# Patient Record
Sex: Male | Born: 1955 | Race: White | Hispanic: No | Smoking: Never smoker
Health system: Southern US, Community
[De-identification: ages and names within clinical notes are randomized; demographics above are authoritative.]

## PROBLEM LIST (undated history)

## (undated) DIAGNOSIS — D649 Anemia, unspecified: Secondary | ICD-10-CM

## (undated) DIAGNOSIS — E119 Type 2 diabetes mellitus without complications: Secondary | ICD-10-CM

## (undated) DIAGNOSIS — E7800 Pure hypercholesterolemia, unspecified: Secondary | ICD-10-CM

## (undated) DIAGNOSIS — I509 Heart failure, unspecified: Secondary | ICD-10-CM

## (undated) DIAGNOSIS — I4891 Unspecified atrial fibrillation: Secondary | ICD-10-CM

## (undated) DIAGNOSIS — I2510 Atherosclerotic heart disease of native coronary artery without angina pectoris: Secondary | ICD-10-CM

## (undated) DIAGNOSIS — Z95810 Presence of automatic (implantable) cardiac defibrillator: Secondary | ICD-10-CM

## (undated) DIAGNOSIS — E785 Hyperlipidemia, unspecified: Secondary | ICD-10-CM

## (undated) DIAGNOSIS — I10 Essential (primary) hypertension: Secondary | ICD-10-CM

## (undated) HISTORY — DX: Hyperlipidemia, unspecified: E78.5

## (undated) HISTORY — DX: Atherosclerotic heart disease of native coronary artery without angina pectoris: I25.10

## (undated) HISTORY — PX: CARDIAC DEFIBRILLATOR PLACEMENT: SHX171

## (undated) HISTORY — DX: Essential (primary) hypertension: I10

## (undated) HISTORY — PX: INSERT / REPLACE / REMOVE PACEMAKER: SUR710

## (undated) HISTORY — PX: EYE SURGERY: SHX253

---

## 1994-02-26 HISTORY — PX: CARDIAC CATHETERIZATION: SHX172

## 1999-07-27 ENCOUNTER — Encounter: Payer: Self-pay | Admitting: Orthopedic Surgery

## 1999-07-27 ENCOUNTER — Ambulatory Visit (HOSPITAL_COMMUNITY): Admission: RE | Admit: 1999-07-27 | Discharge: 1999-07-27 | Payer: Self-pay | Admitting: Orthopedic Surgery

## 1999-08-10 ENCOUNTER — Encounter: Payer: Self-pay | Admitting: Orthopedic Surgery

## 1999-08-10 ENCOUNTER — Ambulatory Visit (HOSPITAL_COMMUNITY): Admission: RE | Admit: 1999-08-10 | Discharge: 1999-08-10 | Payer: Self-pay | Admitting: Orthopedic Surgery

## 1999-08-31 ENCOUNTER — Encounter: Payer: Self-pay | Admitting: Orthopedic Surgery

## 1999-09-08 ENCOUNTER — Encounter: Payer: Self-pay | Admitting: Orthopedic Surgery

## 1999-09-08 ENCOUNTER — Encounter (INDEPENDENT_AMBULATORY_CARE_PROVIDER_SITE_OTHER): Payer: Self-pay | Admitting: Specialist

## 1999-09-08 ENCOUNTER — Inpatient Hospital Stay (HOSPITAL_COMMUNITY): Admission: EM | Admit: 1999-09-08 | Discharge: 1999-09-11 | Payer: Self-pay | Admitting: Orthopedic Surgery

## 1999-09-12 ENCOUNTER — Ambulatory Visit: Admission: RE | Admit: 1999-09-12 | Discharge: 1999-09-12 | Payer: Self-pay | Admitting: Specialist

## 2000-02-27 HISTORY — PX: CORONARY ARTERY BYPASS GRAFT: SHX141

## 2000-04-23 ENCOUNTER — Ambulatory Visit (HOSPITAL_COMMUNITY): Admission: RE | Admit: 2000-04-23 | Discharge: 2000-04-23 | Payer: Self-pay | Admitting: Specialist

## 2000-04-23 ENCOUNTER — Encounter: Payer: Self-pay | Admitting: Specialist

## 2000-06-24 ENCOUNTER — Ambulatory Visit (HOSPITAL_COMMUNITY): Admission: RE | Admit: 2000-06-24 | Discharge: 2000-06-24 | Payer: Self-pay | Admitting: Cardiology

## 2000-08-11 ENCOUNTER — Encounter: Payer: Self-pay | Admitting: Emergency Medicine

## 2000-08-11 ENCOUNTER — Inpatient Hospital Stay (HOSPITAL_COMMUNITY): Admission: EM | Admit: 2000-08-11 | Discharge: 2000-08-27 | Payer: Self-pay | Admitting: Emergency Medicine

## 2000-08-21 ENCOUNTER — Encounter: Payer: Self-pay | Admitting: Thoracic Surgery (Cardiothoracic Vascular Surgery)

## 2000-08-22 ENCOUNTER — Encounter: Payer: Self-pay | Admitting: Thoracic Surgery (Cardiothoracic Vascular Surgery)

## 2000-08-23 ENCOUNTER — Encounter: Payer: Self-pay | Admitting: Thoracic Surgery (Cardiothoracic Vascular Surgery)

## 2000-08-24 ENCOUNTER — Encounter: Payer: Self-pay | Admitting: Thoracic Surgery (Cardiothoracic Vascular Surgery)

## 2000-09-24 ENCOUNTER — Ambulatory Visit (HOSPITAL_COMMUNITY): Admission: RE | Admit: 2000-09-24 | Discharge: 2000-09-24 | Payer: Self-pay | Admitting: Cardiology

## 2004-01-19 ENCOUNTER — Ambulatory Visit: Payer: Self-pay | Admitting: Internal Medicine

## 2004-01-27 ENCOUNTER — Ambulatory Visit (HOSPITAL_COMMUNITY): Admission: RE | Admit: 2004-01-27 | Discharge: 2004-01-27 | Payer: Self-pay | Admitting: Surgery

## 2004-03-10 ENCOUNTER — Ambulatory Visit: Payer: Self-pay | Admitting: Cardiovascular Disease

## 2004-03-13 ENCOUNTER — Inpatient Hospital Stay (HOSPITAL_COMMUNITY): Admission: RE | Admit: 2004-03-13 | Discharge: 2004-03-13 | Payer: Self-pay | Admitting: Internal Medicine

## 2004-03-13 ENCOUNTER — Ambulatory Visit: Payer: Self-pay | Admitting: Internal Medicine

## 2004-03-27 ENCOUNTER — Ambulatory Visit: Payer: Self-pay | Admitting: Internal Medicine

## 2004-07-11 ENCOUNTER — Ambulatory Visit: Payer: Self-pay | Admitting: Internal Medicine

## 2004-09-07 ENCOUNTER — Ambulatory Visit: Payer: Self-pay | Admitting: Internal Medicine

## 2004-10-31 ENCOUNTER — Ambulatory Visit (HOSPITAL_COMMUNITY): Admission: RE | Admit: 2004-10-31 | Discharge: 2004-10-31 | Payer: Self-pay | Admitting: Gastroenterology

## 2004-11-20 ENCOUNTER — Encounter (INDEPENDENT_AMBULATORY_CARE_PROVIDER_SITE_OTHER): Payer: Self-pay | Admitting: *Deleted

## 2004-11-20 ENCOUNTER — Ambulatory Visit (HOSPITAL_COMMUNITY): Admission: RE | Admit: 2004-11-20 | Discharge: 2004-11-20 | Payer: Self-pay | Admitting: General Surgery

## 2005-11-30 ENCOUNTER — Ambulatory Visit: Payer: Self-pay | Admitting: Internal Medicine

## 2006-07-16 ENCOUNTER — Ambulatory Visit: Payer: Self-pay | Admitting: Internal Medicine

## 2006-07-24 LAB — CBC WITH DIFFERENTIAL/PLATELET
BASO%: 0.3 % (ref 0.0–2.0)
Basophils Absolute: 0 10*3/uL (ref 0.0–0.1)
EOS%: 2 % (ref 0.0–7.0)
Eosinophils Absolute: 0.3 10*3/uL (ref 0.0–0.5)
HCT: 43.6 % (ref 38.7–49.9)
HGB: 15.3 g/dL (ref 13.0–17.1)
LYMPH%: 37 % (ref 14.0–48.0)
MCH: 31.8 pg (ref 28.0–33.4)
MCHC: 35.1 g/dL (ref 32.0–35.9)
MCV: 90.5 fL (ref 81.6–98.0)
MONO#: 1 10*3/uL — ABNORMAL HIGH (ref 0.1–0.9)
MONO%: 8.5 % (ref 0.0–13.0)
NEUT#: 6.4 10*3/uL (ref 1.5–6.5)
NEUT%: 52.2 % (ref 40.0–75.0)
Platelets: 266 10*3/uL (ref 145–400)
RBC: 4.82 10*6/uL (ref 4.20–5.71)
RDW: 13.9 % (ref 11.2–14.6)
WBC: 12.3 10*3/uL — ABNORMAL HIGH (ref 4.0–10.0)
lymph#: 4.5 10*3/uL — ABNORMAL HIGH (ref 0.9–3.3)

## 2006-07-26 LAB — COMPREHENSIVE METABOLIC PANEL
ALT: 25 U/L (ref 0–53)
AST: 18 U/L (ref 0–37)
Albumin: 4.8 g/dL (ref 3.5–5.2)
Alkaline Phosphatase: 72 U/L (ref 39–117)
BUN: 19 mg/dL (ref 6–23)
CO2: 25 mEq/L (ref 19–32)
Calcium: 9.2 mg/dL (ref 8.4–10.5)
Chloride: 103 mEq/L (ref 96–112)
Creatinine, Ser: 0.98 mg/dL (ref 0.40–1.50)
Glucose, Bld: 95 mg/dL (ref 70–99)
Potassium: 4.8 mEq/L (ref 3.5–5.3)
Sodium: 140 mEq/L (ref 135–145)
Total Bilirubin: 0.4 mg/dL (ref 0.3–1.2)
Total Protein: 7.7 g/dL (ref 6.0–8.3)

## 2006-07-26 LAB — LACTATE DEHYDROGENASE: LDH: 167 U/L (ref 94–250)

## 2006-07-26 LAB — LEUKOCYTE ALKALINE PHOS: Leukocyte Alkaline  Phos Stain: 134 — ABNORMAL HIGH (ref 22–124)

## 2006-08-07 ENCOUNTER — Encounter: Payer: Self-pay | Admitting: Internal Medicine

## 2006-08-07 ENCOUNTER — Other Ambulatory Visit: Admission: RE | Admit: 2006-08-07 | Discharge: 2006-08-07 | Payer: Self-pay | Admitting: Internal Medicine

## 2006-08-07 LAB — CBC WITH DIFFERENTIAL/PLATELET
BASO%: 0.3 % (ref 0.0–2.0)
Basophils Absolute: 0 10*3/uL (ref 0.0–0.1)
EOS%: 3.3 % (ref 0.0–7.0)
Eosinophils Absolute: 0.3 10*3/uL (ref 0.0–0.5)
HCT: 44.8 % (ref 38.7–49.9)
HGB: 15.3 g/dL (ref 13.0–17.1)
LYMPH%: 36.3 % (ref 14.0–48.0)
MCH: 31.2 pg (ref 28.0–33.4)
MCHC: 34.2 g/dL (ref 32.0–35.9)
MCV: 91.1 fL (ref 81.6–98.0)
MONO#: 0.9 10*3/uL (ref 0.1–0.9)
MONO%: 8.9 % (ref 0.0–13.0)
NEUT#: 4.9 10*3/uL (ref 1.5–6.5)
NEUT%: 51.2 % (ref 40.0–75.0)
Platelets: 259 10*3/uL (ref 145–400)
RBC: 4.91 10*6/uL (ref 4.20–5.71)
RDW: 13.9 % (ref 11.2–14.6)
WBC: 9.7 10*3/uL (ref 4.0–10.0)
lymph#: 3.5 10*3/uL — ABNORMAL HIGH (ref 0.9–3.3)

## 2006-08-07 LAB — LACTATE DEHYDROGENASE: LDH: 185 U/L (ref 94–250)

## 2006-08-14 LAB — FLOW CYTOMETRY

## 2006-11-04 ENCOUNTER — Ambulatory Visit: Payer: Self-pay | Admitting: Internal Medicine

## 2006-11-06 LAB — CBC WITH DIFFERENTIAL/PLATELET
BASO%: 0.4 % (ref 0.0–2.0)
Basophils Absolute: 0 10*3/uL (ref 0.0–0.1)
EOS%: 2.5 % (ref 0.0–7.0)
Eosinophils Absolute: 0.3 10*3/uL (ref 0.0–0.5)
HCT: 43 % (ref 38.7–49.9)
HGB: 15.1 g/dL (ref 13.0–17.1)
LYMPH%: 29.8 % (ref 14.0–48.0)
MCH: 32 pg (ref 28.0–33.4)
MCHC: 35 g/dL (ref 32.0–35.9)
MCV: 91.3 fL (ref 81.6–98.0)
MONO#: 1.1 10*3/uL — ABNORMAL HIGH (ref 0.1–0.9)
MONO%: 8.7 % (ref 0.0–13.0)
NEUT#: 7.4 10*3/uL — ABNORMAL HIGH (ref 1.5–6.5)
NEUT%: 58.6 % (ref 40.0–75.0)
Platelets: 276 10*3/uL (ref 145–400)
RBC: 4.71 10*6/uL (ref 4.20–5.71)
RDW: 14.6 % (ref 11.2–14.6)
WBC: 12.6 10*3/uL — ABNORMAL HIGH (ref 4.0–10.0)
lymph#: 3.8 10*3/uL — ABNORMAL HIGH (ref 0.9–3.3)

## 2006-11-06 LAB — LACTATE DEHYDROGENASE: LDH: 165 U/L (ref 94–250)

## 2006-11-14 LAB — BCR/ABL

## 2006-11-20 LAB — CBC WITH DIFFERENTIAL/PLATELET
BASO%: 0.7 % (ref 0.0–2.0)
Basophils Absolute: 0.1 10*3/uL (ref 0.0–0.1)
EOS%: 2.4 % (ref 0.0–7.0)
Eosinophils Absolute: 0.3 10*3/uL (ref 0.0–0.5)
HCT: 43.2 % (ref 38.7–49.9)
HGB: 14.9 g/dL (ref 13.0–17.1)
LYMPH%: 32.2 % (ref 14.0–48.0)
MCH: 31.7 pg (ref 28.0–33.4)
MCHC: 34.5 g/dL (ref 32.0–35.9)
MCV: 91.8 fL (ref 81.6–98.0)
MONO#: 1 10*3/uL — ABNORMAL HIGH (ref 0.1–0.9)
MONO%: 7.5 % (ref 0.0–13.0)
NEUT#: 7.6 10*3/uL — ABNORMAL HIGH (ref 1.5–6.5)
NEUT%: 57.2 % (ref 40.0–75.0)
Platelets: 270 10*3/uL (ref 145–400)
RBC: 4.7 10*6/uL (ref 4.20–5.71)
RDW: 14.2 % (ref 11.2–14.6)
WBC: 13.2 10*3/uL — ABNORMAL HIGH (ref 4.0–10.0)
lymph#: 4.3 10*3/uL — ABNORMAL HIGH (ref 0.9–3.3)

## 2006-11-20 LAB — LACTATE DEHYDROGENASE: LDH: 182 U/L (ref 94–250)

## 2007-05-16 ENCOUNTER — Ambulatory Visit: Payer: Self-pay | Admitting: Internal Medicine

## 2007-05-21 LAB — CBC WITH DIFFERENTIAL/PLATELET
BASO%: 0.7 % (ref 0.0–2.0)
Basophils Absolute: 0.1 10e3/uL (ref 0.0–0.1)
EOS%: 2.4 % (ref 0.0–7.0)
Eosinophils Absolute: 0.3 10e3/uL (ref 0.0–0.5)
HCT: 42 % (ref 38.7–49.9)
HGB: 14.4 g/dL (ref 13.0–17.1)
LYMPH%: 34.7 % (ref 14.0–48.0)
MCH: 31.1 pg (ref 28.0–33.4)
MCHC: 34.3 g/dL (ref 32.0–35.9)
MCV: 90.8 fL (ref 81.6–98.0)
MONO#: 1 10e3/uL — ABNORMAL HIGH (ref 0.1–0.9)
MONO%: 8.9 % (ref 0.0–13.0)
NEUT#: 6 10e3/uL (ref 1.5–6.5)
NEUT%: 53.3 % (ref 40.0–75.0)
Platelets: 219 10e3/uL (ref 145–400)
RBC: 4.63 10e6/uL (ref 4.20–5.71)
RDW: 14.8 % — ABNORMAL HIGH (ref 11.2–14.6)
WBC: 11.3 10e3/uL — ABNORMAL HIGH (ref 4.0–10.0)
lymph#: 3.9 10e3/uL — ABNORMAL HIGH (ref 0.9–3.3)

## 2007-05-21 LAB — LACTATE DEHYDROGENASE: LDH: 170 U/L (ref 94–250)

## 2008-08-11 ENCOUNTER — Emergency Department (HOSPITAL_COMMUNITY): Admission: EM | Admit: 2008-08-11 | Discharge: 2008-08-11 | Payer: Self-pay | Admitting: Emergency Medicine

## 2009-11-14 ENCOUNTER — Ambulatory Visit: Payer: Self-pay | Admitting: Cardiology

## 2009-11-19 ENCOUNTER — Ambulatory Visit: Payer: Self-pay | Admitting: Cardiology

## 2010-01-28 ENCOUNTER — Encounter: Payer: Self-pay | Admitting: Internal Medicine

## 2010-03-30 NOTE — Miscellaneous (Signed)
Summary: Device preload  Clinical Lists Changes  Observations: Added new observation of ICD INDICATN: CM (01/28/2010 13:10) Added new observation of ICDLEADSTAT2: active (01/28/2010 13:10) Added new observation of ICDLEADSER2: [id number] (01/28/2010 13:10) Added new observation of ICDLEADMOD2: 6949  (01/28/2010 13:10) Added new observation of ICDLEADDOI2: 03/13/2004  (01/28/2010 13:10) Added new observation of ICDLEADLOC2: RV  (01/28/2010 13:10) Added new observation of ICDLEADSTAT1: active  (01/28/2010 13:10) Added new observation of ICDLEADSER1: [id number]  (01/28/2010 13:10) Added new observation of ICDLEADMOD1: 5076  (01/28/2010 13:10) Added new observation of ICDLEADDOI1: 03/13/2004  (01/28/2010 13:10) Added new observation of ICDLEADLOC1: RA  (01/28/2010 13:10) Added new observation of ICD IMP MD: Sherryl Manges, MD  (01/28/2010 13:10) Added new observation of ICD IMPL DTE: 03/13/2004  (01/28/2010 13:10) Added new observation of ICD SERL#: [id number]  (01/28/2010 13:10) Added new observation of ICD MODL#: 7278  (01/28/2010 13:10) Added new observation of ICDMANUFACTR: Medtronic  (01/28/2010 13:10) Added new observation of IDC REFER MD: Duffy Rhody Tennant,MD  (01/28/2010 13:10) Added new observation of ICD MD: [name], MD  (01/28/2010 13:10)       ICD Specifications Following MD:  [name], MD     Referring MD:  Rolla Plate ICD Vendor:  Medtronic     ICD Model Number:  [phone]     ICD Serial Number:  [id number] ICD DOI:  03/13/2004     ICD Implanting MD:  Sherryl Manges, MD  Lead 1:    Location: RA     DOI: 03/13/2004     Model #: [id number]     Serial #: [id number]     Status: active Lead 2:    Location: RV     DOI: 03/13/2004     Model #: [id number]     Serial #: [id number]     Status: active  Indications::  CM

## 2010-05-01 ENCOUNTER — Encounter: Payer: Self-pay | Admitting: Internal Medicine

## 2010-05-01 ENCOUNTER — Encounter (INDEPENDENT_AMBULATORY_CARE_PROVIDER_SITE_OTHER): Payer: Medicare PPO | Admitting: Internal Medicine

## 2010-05-01 DIAGNOSIS — E785 Hyperlipidemia, unspecified: Secondary | ICD-10-CM | POA: Insufficient documentation

## 2010-05-01 DIAGNOSIS — Z6841 Body Mass Index (BMI) 40.0 and over, adult: Secondary | ICD-10-CM | POA: Insufficient documentation

## 2010-05-01 DIAGNOSIS — I2589 Other forms of chronic ischemic heart disease: Secondary | ICD-10-CM | POA: Insufficient documentation

## 2010-05-01 DIAGNOSIS — Z95810 Presence of automatic (implantable) cardiac defibrillator: Secondary | ICD-10-CM | POA: Insufficient documentation

## 2010-05-01 DIAGNOSIS — I255 Ischemic cardiomyopathy: Secondary | ICD-10-CM | POA: Insufficient documentation

## 2010-05-01 DIAGNOSIS — I10 Essential (primary) hypertension: Secondary | ICD-10-CM | POA: Insufficient documentation

## 2010-05-01 DIAGNOSIS — I5022 Chronic systolic (congestive) heart failure: Secondary | ICD-10-CM

## 2010-05-09 NOTE — Assessment & Plan Note (Signed)
Summary: icd check/medtronic/[place] CARD patient/hm   Visit Type:  Follow-up Primary Provider:  Dr.Fred Andrey [facility]   History of Present Illness: Douglas [name] is referred  today for ongoing evaluation and management of his ICD. He is a pleasant middle aged man with an ICM, s/p ICD implantation, HTN, dyslipidemia, and low back pain. He is s/p CABG in 2002. He has recently gotten a dog and with daily walks has continued to slowly lose weight. He denies c/p, sob or peripheral edema. No ICD therapies.  Current Medications (verified): 1)  Furosemide 40 Mg Tabs (Furosemide) .... Take 1 Tab Daily 2)  Toprol Xl 50 Mg Xr24h-Tab (Metoprolol Succinate) .... Take 1 Tab Daily 3)  Lisinopril 10 Mg Tabs (Lisinopril) .... Take 1 Tab Daily 4)  Cyclobenzaprine Hcl 10 Mg Tabs (Cyclobenzaprine Hcl) .... Take 1 Tab Daily 5)  Proair Hfa 108 (90 Base) Mcg/act Aers (Albuterol Sulfate) .... As Needed 6)  Alprazolam 1 Mg Tabs (Alprazolam) .... As Needed 7)  Pravastatin Sodium 80 Mg Tabs (Pravastatin Sodium) .... Take One Tablet By Mouth Daily At Bedtime 8)  Percocet 10-650 Mg Tabs (Oxycodone-Acetaminophen) .... As Needed For Pain  Allergies (verified): No Known Drug Allergies  Comments:  Nurse/Medical Assistant: patient uses [facility] in [place]  Past History:  Past Medical History: Last updated: April 30, 2010 myocardial infarction times 4 stenting in 1996 coronary artery bypass grafting times 2  obese hypertension dyslipidemia asthma fatigue  Past Surgical History: Last updated: April 30, 2010 coronary artery bypass grafting times 4 (Dr.Clarence Cornelius Moras) 2002 stent placement 1996  Family History: Last updated: 04/30/10 Father:deceased age 39 due to myocardial infarction Mother:alive age 95  Social History: Last updated: 2010-04-30 Disabled (truck driver) Divorced  Tobacco Use - Former.  Alcohol Use - no Regular Exercise - no Drug Use - no  Review of Systems   All systems reviewed and negative except as noted in the HPI.  Vital Signs:  Patient profile:   55 year old male Height:      69 inches Weight:      289 pounds BMI:     42.83 Pulse rate:   93 / minute BP sitting:   155 / 93  (left arm)  Vitals Entered By: Douglas Saa, CNA (May 01, 2010 10:19 AM)  Physical Exam  General:  Obese, well developed, well nourished middle age man in no acute distress.  HEENT: normal Neck: supple. No JVD. Carotids 2+ bilaterally no bruits Cor: RRR no rubs, gallops or murmur Lungs: CTA. Well healed ICD incision. Ab: soft, nontender. nondistended. No HSM. Good bowel sounds Ext: warm. no cyanosis, clubbing or edema Neuro: alert and oriented. Grossly nonfocal. affect pleasant     ICD Specifications Following MD:  [name], MD     Referring MD:  Rolla Plate ICD Vendor:  Medtronic     ICD Model Number:  [id number]     ICD Serial Number:  [id number] ICD DOI:  03/13/2004     ICD Implanting MD:  Sherryl Manges, MD  Lead 1:    Location: RA     DOI: 03/13/2004     Model #: [id number]     Serial #: [id number]     Status: active Lead 2:    Location: RV     DOI: 03/13/2004     Model #: [id number]     Serial #: [id number]     Status: active  Indications::  CM  MD Comments:  Normal device function.  Impression & Recommendations:  Problem # 1:  AUTOMATIC IMPLANTABLE CARDIAC DEFIBRILLATOR SITU (ICD-V45.02) His device is working normally. Wil follow.  Problem # 2:  CARDIOMYOPATHY, ISCHEMIC (ICD-414.8) He denies anginal symptoms. Continue meds as below. His updated medication list for this problem includes:    Furosemide 40 Mg Tabs (Furosemide) .Marland Kitchen... Take 1 tab daily    Toprol Xl 50 Mg Xr24h-tab (Metoprolol succinate) .Marland Kitchen... Take 1 tab daily    Lisinopril 10 Mg Tabs (Lisinopril) .Marland Kitchen... Take 1 tab daily  Problem # 3:  ESSENTIAL HYPERTENSION, BENIGN (ICD-401.1) His blood pressure is moderately elevated. He will continue his current meds and maintain a low sodium diet.  I have asked that he continue to walk and to lose weight. His updated medication list for this problem includes:    Furosemide 40 Mg Tabs (Furosemide) .Marland Kitchen... Take 1 tab daily    Toprol Xl 50 Mg Xr24h-tab (Metoprolol succinate) .Marland Kitchen... Take 1 tab daily    Lisinopril 10 Mg Tabs (Lisinopril) .Marland Kitchen... Take 1 tab daily  Patient Instructions: 1)  Your physician recommends that you schedule a follow-up appointment in: 1 year

## 2010-05-16 NOTE — Cardiovascular Report (Signed)
Summary: Office Visit   Office Visit   Imported By: Roderic Ovens 05/10/2010 11:51:29  _____________________________________________________________________  External Attachment:    Type:   Image     Comment:   External Document

## 2010-06-05 LAB — POCT CARDIAC MARKERS
CKMB, poc: 1.3 ng/mL (ref 1.0–8.0)
CKMB, poc: <1 ng/mL — ABNORMAL LOW (ref 1.0–8.0)
Myoglobin, poc: 144 ng/mL (ref 12–200)
Myoglobin, poc: 173 ng/mL (ref 12–200)
Troponin i, poc: <0.05 ng/mL (ref 0.00–0.09)
Troponin i, poc: <0.05 ng/mL (ref 0.00–0.09)

## 2010-07-14 NOTE — Discharge Summary (Signed)
Douglas [name], WHANG NO.:  [id number]   MEDICAL RECORD NO.:  [id number]          PATIENT TYPE:  INP   LOCATION:  4733                         FACILITY:  [facility]   PHYSICIAN:  Duke Salvia, M.D.  DATE OF BIRTH:  [birth year]   DATE OF ADMISSION:  03/13/2004  DATE OF DISCHARGE:  03/13/2004                                 DISCHARGE SUMMARY   DISCHARGE DIAGNOSES:  1.  Discharging day of implantation of Medtronic MAXIMO DR [id number] cardioverter-      defibrillator with defibrillator threshold study less than or equal to      15 joules.  2.  History of myocardial infarction times four, the first in 1991.  3.  Known coronary artery disease status post stent in 1996 with repeat      catheterization in 2002 with second stent placed with post procedure      angina, subsequent coronary artery bypass graft surgery times two in      2002.   SECONDARY DIAGNOSES:  1.  Hypertension.  2.  Dyslipidemia.  3.  Morbid obesity.  4.  Negative sleep study.  5.  Family history of premature coronary artery disease.  6.  Back surgery times three.   PROCEDURES:  March 13, 2004, implantation of a Medtronic [facility]  cardioverter-defibrillator by Dr. Sherryl Manges, practitioner, defibrillation  threshold study less than or equal to 15 joules.   Patient tolerated the procedure well, had no post procedure dysrhythmias.  His incision at the time of discharge was not swollen nor draining and his  pain was controlled with oral analgesia.   PATIENT DISCHARGED ON THE FOLLOWING MEDICATIONS:  1.  Lipitor 40 mg daily.  2.  Potassium chloride 20 mEq daily.  3.  Furosemide 40 mg daily.  4.  Toprol __________ mg daily.  5.  Lisinopril 10 mg daily.  6.  Advair 250/50 2 puffs twice daily.  7.  Cyclobenzaprine 10 mg daily.  8.  Albuterol inhaler as needed.  9.  Xanax 2 mg tablets as needed.  10. For pain, he may take Tylenol 325 mg 1-2 tabs q.4-6h.   Mobility has been discussed with the patient.   He is to refrain from moving  the right upper extremity above the shoulder for the next 4 days.  This is  particularly important in order to not cause lead dislodgement.  He was  asked to keep his incision dry for the next 7 days.  He will be asked to  remove his bandage on the morning of Tuesday, March 14, 2004, and he will  leave the incision open and dry for the next 7 days.   FOLLOW UP:  Will be at [facility], [address], for ICD  clinic Monday, March 27, 2004, at 9:30 a.m. and he will see Dr. Graciela Husbands  Wednesday June 21, 2004, at 10:45 a.m.   BRIEF HISTORY:  Mr. Revard is a 55 year old male with a prior history of  myocardial infarction x4.  His first myocardial infarction dates to 52.  He had his first stent placed in 1996 with a repeat  myocardial infarction  and stent in 2002.  He continued to have post-procedure angina and underwent  subsequently coronary artery bypass graft surgery x2 at that same  hospitalization.  He had a Cardiolite study in mid November, 2005,  demonstrating anterior and inferior defects with ejection fraction 26%.  Mr.  Raygoza meets criteria for cardioverter-defibrillator implant by the MADIT-  II study.  He presents today for elective procedure.  Mr. Nuzum is also  morbidly obese, though he is working on weight reduction; he is down to 294  from 309.  He has hypertension, dyslipidemia, asthma, fatigue and four  pillow orthopnea.  He will have palpitation and chest discomfort if he  pushes his exercise level too hard.  His coronary artery bypass include the  LIMA to the LAD, a saphenous vein graft to the diagonal and a sequential  saphenous vein graft to the PDA and into the posterolateral branch.   HOSPITAL COURSE:  Mr. Surgeon presents electively for cardioverter-  defibrillator placement on the morning of March 13, 2004.  The procedure  was done without complications by Dr. Graciela Husbands the same day.  He discharges  later that day.   Once again, he has maintained sinus rhythm, has had no  respiratory compromise.  He has not been hypotensive after the study.  He  goes home with the medication and followup as dictated.       GM/MEDQ  D:  03/13/2004  T:  03/13/2004  Job:  [id number]   cc:   Duke Salvia, M.D.   Khris P. Merla Riches, M.D.  [address]  [place]  [place] [address]  Fax: [phone]   Colleen Can. Deborah Chalk, M.D.  Fax: [phone]

## 2010-07-14 NOTE — Consult Note (Signed)
Douglas [name]. [facility]  Patient:    Douglas [name], Douglas [name]                     MRN: [id number] Proc. Date: 08/21/00 Adm. Date:  [id number] Attending:  Eleanora Neighbor CC:         Colleen Can. Deborah Chalk, M.D.  Alvia Grove., M.D.  [facility] office   Consultation Report  REQUESTING PHYSICIAN:  Dr. Delfin Edis.  REASON FOR CONSULTATION:  Severe two-vessel coronary artery disease, status post acute myocardial infarction with class 4 postinfarction angina and class 4 congestive heart failure.  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  He underwent angioplasty of the left anterior descending coronary artery at that time.  He underwent repeat angioplasty of the left anterior descending coronary artery in 1996 after another myocardial infarction.  Since that time, he has been treated medically.  He presented several weeks ago with symptoms of progressive shortness of breath and exertional angina.  Cardiac catheterization performed at that time demonstrated 50-60% ostial stenosis of the left anterior descending coronary artery with chronically occluded right coronary artery.  He was treated medically.  On June 16, he presented to the emergency room with massive acute anterior myocardial infarction.  He was taken to the cath lab by Dr. Delane Ginger where an occluded left anterior descending coronary artery was angioplastied.  Eventually, flow was reestablished, and an acceptable anatomical result was completed.  The patients peak total CK and CK-MB fractions following his infarction were notable for a total CK of 5048 and a total CK-MB of 1253.  He suffered from class 4 congestive heart failure and was treated with diuresis.  Over the ensuing several days, he has continued to have episodes of  chest pain requiring IV nitroglycerin and returned back to the cardiac intensive care unit.  Repeat cardiac catheterization performed by Dr. Deborah Chalk on June 25 demonstrates persistent ostial 60-70% stenosis of the left anterior descending coronary artery as well as chronically occluded right coronary artery.  There is severe left ventricular dysfunction with severely depressed ejection fraction and akinetic anterior wall of the left ventricle.  Cardiac surgical consultation is requested.  REVIEW OF SYSTEMS:  GENERAL:  Within normal limits.  The patient has been actively trying to lose weight and has lost nearly 60 pounds over the last six months.  RESPIRATORY:  The patient describes progressive dyspnea on exertion which began in April.  He has had shortness of breath with rest and with minimal activity since his heart attack this week while he remains in the hospital.  He denies any episodes of PND, orthopnea, or left lower extremity. He has had no problems with cough or hemoptysis.  GASTROINTESTINAL:  Within normal limits.  The patient has normal bowel habits and good appetite. NEUROLOGIC:  Within normal limits other than chronic pain related to chronic low back injury as well as chronic numbness involving the medial three toes of the right foot which dates back to his last back surgery.  MUSCULOSKELETAL: Chronic low back pain as previously outlined, otherwise within normal limits. GENITOURINARY:  Within normal limits.  INFECTIOUS:  Negative.  HEMATOLOGIC: Negative.  ENDOCRINE:  Negative.  PSYCHIATRIC:  The patient had some history of anxiety and panic attacks as well as depression and has been taking Xanax on a regular  basis recently.  VASCULAR:  Within normal limits.  The patient denies any history of claudication or TIA.  PAST MEDICAL HISTORY:  Notable for coronary artery disease as stated previously as well as hypertension, hyperlipidemia, and a strong Family History of coronary  artery disease.  The patient denies any history of previous stroke or diabetes.  PAST SURGICAL HISTORY:  Notable for prior back surgery x 2 for ruptured disks in the L1 through L5 interspaces.  He also had surgical excision of pilonidal cyst in the past.  SOCIAL HISTORY:  The patient lives alone and cares for his brother who is schizophrenic and a disabled veteran.  He has a supportive family.  He works full time as a Naval architect doing strenuous physical activity.  He has a history of heavy tobacco use, although he quit smoking in 1996.  He has no history of heavy alcohol consumption.  MEDICATIONS PRIOR TO ADMISSION:  Toprol XL, Imdur, Lipitor, aspirin, Xanax, Robaxin, and Lorcet tablets as necessary for pain.  The patient had previously been on Vioxx and quit taking this as he felt that it exacerbated his shortness of breath, hypertension, and angina.  ALLERGIES/INTOLERANCES:  The patient reports drug sensitivity to CODEINE and CODEINE-CONTAINING PRODUCTS.  PHYSICAL EXAMINATION:  VITAL SIGNS:  Blood pressure averages between 90 and 100 mmHg systolic.  He is afebrile.  GENERAL APPEARANCE:  Notable for a well-appearing, obese while male who appears his stated age in no acute distress.  SKIN:  Clean, dry, and healthy appearing throughout.  HEENT:  Essentially within normal limits.  NECK:  Supple.  There is no jugular venous distension.  No carotid bruits are noted.  LYMPHATICS:  There is no cervical or supraclavicular lymphadenopathy.  RESPIRATORY:  Auscultation of the chest reveals a few bibasilar rales.  No wheezes or rhonchi are noted.  CARDIOVASCULAR:  He is in normal sinus rhythm by monitor.  Cardiovascular exam demonstrates regular rate and rhythm.  No murmurs, rubs, or gallops are noted.  ABDOMEN:  Obese, soft, and nontender.  There are no masses, and the liver edge is not enlarged.   EXTREMITIES:  Warm and well perfused.  There is no lower extremity  edema. Distal pulses are palpable in both lower legs.  There is no venous insufficiency.  NEUROLOGIC:   Examination is grossly nonfocal and symmetrical throughout.  RECTAL/GENITOURINARY:  Deferred.  LABORATORY DATA:  Recent blood work is notable for basic metabolic panel within normal limits on June 25 as well as a complete blood count on the same day with white blood count 11,800, hemoglobin 13.1, hematocrit of 38%, and a platelet count of 292,000.  The patients serum creatinine was 0.8. Coagulation profile was within normal limits.  DIAGNOSTIC TESTS:  Cardiac catheterization films obtained at the time of hospital admission as well as on June 25 are both reviewed.  These demonstrate initially the presence of totally occluded left anterior descending coronary artery which was angioplastied at the time of hospital admission.  The patient was left with 60-70% ostial stenosis of the left anterior descending coronary artery as well as some irregular areas in the midportion of the vessel.  There is a large diagonal branch which takes off before the level of the angioplasty.  The right coronary artery is chronically occluded and posterior descending coronary artery and right posterior lateral branch both fill via collaterals from the left coronary circulation.  There is insignificant disease in the left circumflex system.  Left ventricular function appears moderate to severely reduced with  ejection fraction estimated 30% and severe anterior apical akinesis.  IMPRESSION:  Severe two-vessel coronary artery disease, status post massive acute anterior myocardial infarction complicated by class 4 congestive heart failure.  Initially successfully treated with angioplasty of the left anterior descending coronary artery, but the patient continues to have class 4 postinfarction angina and had repeat catheterization demonstrating persistent disease.  PLAN:  We tentatively plan to proceed with  elective coronary artery bypass grafting on Friday, June 28.  I have outlined at length the indications, risks, and potential benefits of surgery with Douglas [name].  All of his questions have been addressed. DD:  08/21/00 TD:  08/21/00 Job: [id number]

## 2010-07-14 NOTE — Op Note (Signed)
NAME:  Douglas [name], Douglas [name] NO.:  [id number]   MEDICAL RECORD NO.:  [id number]          PATIENT TYPE:  AMB   LOCATION:  DAY                          [facility]ACILITY[facility]  [facility]   PHYSICIAN:  Ollen Gross. Vernell Morgans, M.D. DATE OF BIRTH:  September 09, 1955   DATE OF PROCEDURE:  11/20/2004  DATE OF DISCHARGE:  11/20/2004                                 OPERATIVE REPORT   PREOPERATIVE DIAGNOSIS:  Internal hemorrhoids.   POSTOPERATIVE DIAGNOSIS:  Internal hemorrhoids.   PROCEDURES:  PPH (procedure for prolapse and hemorrhoids) hemorrhoidectomy.   SURGEON:  Ollen Gross. Carolynne Edouard, M.D.   ANESTHESIA:  General endotracheal.   PROCEDURE:  After informed consent was obtained, the patient was brought to  the operating room and left in supine position on the stretcher. After  adequate induction of general anesthesia, the patient was moved into a prone  position on the operating room table. All pressure points were padded. The  patient was then placed in a jackknife type position and his buttocks were  retracted laterally with tape. His perirectal area was then prepped with  Betadine and draped in usual sterile manner. Initially a small bullet  retractor was placed in the rectum and the rectum was examined. Only  internal hemorrhoids were noted. A large deep Fansler retractor was then  placed within the rectum. A 2-0 Prolene pursestring stitch was then placed  in the mucosa of the rectum approximately 4-5 cm deep to the dentate line.  This stitch was placed making sure that each throw of the stitch started  where the previous throw ended and gathering up only mucosa and submucosa.  Once this pursestring stitch was in place, the Fansler retractor was  removed. The ends of the pursestring stitch were brought through a clear  retractor and white dilator and the retractor and dilator were placed within  the rectum. The white dilator was then removed. A PTH stapling device was  then placed in the rectum and was felt  to fall beneath the pursestring  stitch. The pursestring stitch was then cinched down and tied. The ends of  this pursestring stitch were brought through the lateral openings in the  stapling device. An air knot was then thrown for retraction purposes while  continuing gentle traction on the pursestring stitch. The stapling device  was gradually closed all the way. Once the device was closed all the way, it  appeared to be in very good position. A minute was allowed to pass. The  stapling device was then fired. Another minute was allowed to pass. The  stapling device was then opened a half turn and removed from the rectum. The  specimen was then examined. There was a very uniform doughnut of internal  hemorrhoidal tissue. No muscle was identified. This was sent to pathology  for further evaluation. The smaller Fansler retractor was then placed within  the rectum and the staple line was examined. Several small bleeding points  were controlled with figure-of-eight 4-0 Vicryl stitches until the area was  completely hemostatic. Prior to starting the procedure, the perirectal area  was infiltrated with 0.25% Marcaine  with epinephrine and 1 cc Wydase and the  tissue was massaged gently. At the end of the case, the rest of the 0.25%  Marcaine with epinephrine was infiltrated around the perirectal area.  Lidocaine jelly was then placed in the rectum  along with a small piece of Gelfoam and sterile dressings were applied. The  patient tolerated the procedure well. At the end of the case, all needle,  sponge and instrument counts were correct. The patient was then awakened and  taken to recovery in stable condition.      Ollen Gross. Vernell Morgans, M.D.  Electronically Signed     PST/MEDQ  D:  11/22/2004  T:  11/22/2004  Job:  [id number]
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
## 2010-07-14 NOTE — Cardiovascular Report (Signed)
[place]  Patient:    Douglas [name], Douglas [name]                     MRN: [id number] Proc. Date: 08/20/00 Adm. Date:  [id number] Attending:  Eleanora Neighbor CC:         [facility]   Cardiac Catheterization  INDICATIONS:  The patient is a 55 year old male, who presents with an anterior myocardial infarction on August 11, 2000.  He has had post myocardial infarction angina.  He had an angioplasty and stent in his left anterior descending and this was a difficult procedure.  PROCEDURE:  Left heart catheterization with selective coronary angiography and left ventricular angiography.  TYPE AND SITE OF ENTRY:  Percutaneous right femoral artery.  CATHETERS:  A 6 French 4 curved Judkins right and left coronary catheters, 6 French pigtail ventriculographic catheter.  CONTRAST MATERIAL:  Omnipaque.  MEDICATIONS GIVEN DURING THE PROCEDURE:  Versed 4 mg IV.  MEDICATIONS GIVEN PRIOR TO THE PROCEDURE:  Valium 10 mg p.o.  COMMENTS:  The patient tolerated the procedure well.  HEMODYNAMIC DATA:  The aortic pressure was 88/58, LV was 88/23.  There was no aortic valve gradient noted on pullback.  LEFT VENTRICULOGRAPHY:  Left ventricular angiogram was performed in the RAO position.  Overall cardiac size was normal.  There was anterior and anteroapical hypokinesia.  The global ejection fraction would be exercise stress test to be approximately 45%.  There was mild inferior hypokinesia. There was no mitral regurgitation, intracardiac calcification or intracavity filling defect.  In particular, there was no apical thrombus.  ANGIOGRAPHIC DATA: 1. Right coronary artery:  The right coronary artery is totally occluded    after a small to moderate sized acute marginal vessel.  There was severe    and diffuse disease in the proximal segment of the right coronary artery    prior to this.  Stenoses would be in the 70-90% range with diffuse    disease.   The acute marginal vessel is small. 2. Left main:  The left main coronary artery has mild distal narrowing. 3. Left anterior descending:  The left anterior descending has approximately    50% ostial narrowing.  This involves the ostium of the left circumflex to    some extent.  The stented segment of the left anterior descending is    widely patent and there is excellent flow throughout the course of the    vessel.  There is a large diagonal vessel that is free of significant    stenosis.  The left anterior descending provides collaterals to the    distal right coronary artery bed.  Multiple attempts were made to try to    get clear image of the ostium of the largest of the diagonal vessels and    it is felt that probably be satisfactory. 4. Left circumflex:  The left circumflex is a reasonably large vessel with a    posterolateral branch and a large obtuse marginal.  There is 20-30%    ostial narrowing in this vessel.  The left circumflex continues to the    posterior apical wall and really does not have any significant severe    stenosis otherwise.  OVERALL IMPRESSION: 1. Totally occluded right coronary artery. 2. Persistent patency of the stent in the left anterior descending but with    a residual 50% ostial narrowing. 3. Minimal disease in the left circumflex system. 4. A 20-30% distal left main coronary  artery stenosis. 5. Mild to moderate reduction in left ventricular function with    anteroapical hypokinesia.  DISCUSSION:  Ones initial impression would be to try to manage the patient medically.  However, he does have some ostial left anterior descending disease and with a totally occluded right coronary artery and left ventricular dysfunction one has to be concerned about potential ischemia and ongoing risks especially at such a young age.  We will need to do probable pharmacologic and nuclear stress testing to make sure we do not have any areas of potential ischemia. DD:   08/20/00 TD:  08/20/00 Job: [id number]

## 2010-07-14 NOTE — H&P (Signed)
NAMEWORLEY, RADERMACHER NO.:  [id number]   MEDICAL RECORD NO.:  [id number]          PATIENT TYPE:  INP   LOCATION:  2899                         FACILITY:  [facility]   PHYSICIAN:  Duke Salvia, M.D.  DATE OF BIRTH:  Feb 09, 1956   DATE OF ADMISSION:  03/13/2004  DATE OF DISCHARGE:                                HISTORY & PHYSICAL   PRIMARY CARE PHYSICIAN:  Harrel Lemon. Merla Riches, M.D.   CARDIOLOGIST:  Colleen Can. Deborah Chalk, M.D.   ELECTROPHYSIOLOGIST:  Duke Salvia, M.D.   ALLERGIES:  CODEINE, VIOXX.   PRESENTING CIRCUMSTANCE:  I have a lot of questions about my  defibrillator.   HISTORY OF PRESENT ILLNESS:  Douglas [name] is a 55 year old male with a prior  history of myocardial infarction x4.  His first heart attack dates back to  71.  Subsequently, he had stenting in 1996.  He had a repeat myocardial  infarction in 2000 with stent placement, post-procedure angina and  subsequent coronary artery bypass graft surgery x2.  Since that time he has  done fairly well.  However, he was having increasing lower extremity edema  in the Fall of 2005, and in mid-November 2005 he underwent coronary study.  This demonstrated anterior and inferior defects, with ejection fraction 26%.  Douglas [name] meets the criteria for ICD implant or MADITL-II study.  He  presents today for elective procedure.  Douglas [name] is also morbidly obese,  though he is working on his weight.  He is currently 294 down from 309.  He  has hypertension, dyslipidemia, asthma, fatigue, four-pillow orthopnea.  He  will have palpitations and chest discomfort if he pushes his exercise too  hard.   Coronary artery bypass graft surgery June 2002 by Dr. Tressie Stalker:  Four  bypasses were placed -- LIMA to the LAD, a saphenous vein graft to the  diagonal and a sequential saphenous vein graft to the posterior descending  coronary artery and then to the posterolateral branch.  Once again, ejection  fraction 26%  by  Cardiolite.   MEDICATIONS:  1.  Lipitor 40 mg q.d. at bedtime.  2.  Potassium chloride 20 mEq q.d.  3.  Furosemide 40 mg q.d.  4.  Toprol XL 50 mg q.d.  5.  Lisinopril 10 mg q.d.  6.  Advair 250/50 mg two puffs b.i.d.  7.  Cyclobenzaprine 10 mg q.d.  8.  Albuterol inhaler as needed.  9.  Xanax 2 mg p.r.n.   SOCIAL HISTORY:  The patient lives in [place].  He is divorced.  He is  disabled currently, but worked as a Occupational hygienist,  and heavy equipment transporter.  He has a history of tobacco habituation,  but quit in 1996.  Congestive failure symptoms:  He is short of breath when  walking on inclines.  He can walk 1-1/2 miles daily now, before getting  particularly short of breath.   FAMILY HISTORY:  Mother is still living at 85.  She has congestive heart  failure and diabetes.  Father died at age 32 of a myocardial infarction.  The patient  says that his uncles and grandparents on the father's side all  have had heart attacks or heart disease.  He has one brother who is doing  well.  One sister with diabetes.  One sister with arthritis.   REVIEW OF SYSTEMS:  CONSTITUTIONAL:  The patient denies fever, chills;  denied any sweats.  He is working hard on weight and currently is purposely  losing weight.  HEENT:  No nasal discharge, epistaxis, voice change or  vertigo.  INTEGUMENT:  No rashes, nonhealing ulcerations or lesions.  CARDIOPULMONARY:  Chest pain when pushing too hard.  Gets short of breath  with exertions.  He paces himself.  He has four-pillow orthopnea, paroxysmal  nocturnal dyspnea.  He has had increasing edema since the fall of 2005.  He  has a history of syncope x3; his first was 2002 when he got hot, then dizzy,  then nausea and then presented with a heart attack.  It seems that his  syncope follows these patterns.  He will be exerting himself  (as in mowing  the lawn), get hot and dizzy and then collapse.  He does have palpitations,  but  his last feeling of those was about one year ago when he was exercising  hard.  GASTROINTESTINAL:  He has had no history of GI bleeding; no history  of chronic heartburn.  ENDOCRINE:  Thyroid status is unknown.  Apparently  does not have diabetes.  All other systems are negative.   PHYSICAL EXAMINATION:  VITAL SIGNS:  Temperature 97.1, pulses 84, blood  pressure 131/66, respirations 20.  GENERAL:  This is an alert and oriented male, with no acute distress.  HEENT:  Normocephalic atraumatic. EYES:  Pupils equal round and reactive to  light.  Extraocular movements are intact.  Sclerae are clear.  NARES:  Without discharge.  NECK:  Supple; no carotid bruits auscultated.  No jugular venous distention.  HEART:  Irregular rate and rhythm.  S1, S2 clearly auscultated.  No murmur.  LUNGS:  Clear to auscultation, but mildly congestive and has inspiratory  wheezes.  ABDOMEN:  Obese and unable to palpate deeply.  Bowel sounds are present.  No  abdominal pain or discomfort.  EXTREMITIES:  No evidence of clubbing, cyanosis or edema.  There is evidence  of prior coronary artery bypass graft surgery, with a well healed median  sternotomy.  The right lower extremity bears witness to saphenous vein graft  harvesting.  MUSCULOSKELETAL:  No joint deformity, effusions, kyphosis. The patient has  history of back surgery x3.  NEUROLOGIC:  No focal neurologic deficits noted; although he does say that  he feels decrease sensitivity in some of three of the toes of the right  foot.  There was some shooting pains down the right leg -- this is  attributable to his back surgery.   CHEST X-RAY:  This will be pending.   EKG:  Electrocardiogram as of January 19, 2004:  Heart rate 74, sinus  rhythm, left atrial enlargement, PR intervals 168, QRS 100,  QTC 409.   LABORATORY DATA:  All laboratory studies are pending.   ASSESSMENT: 1.  History of myocardial infarction x4; his first in 24 at age 55.  2.  Coronary  artery disease, status post stenting in 1996 and then again in      2002; with post-stenting infarction angina and subsequent coronary      artery bypass graft surgery x4.  3.  Hypertension.  4.  Dyslipidemia.  5.  Morbid obesity.  6.  Negative sleep study, but says his heart went flatline several times      during the study.  7.  Strong family history of premature coronary artery disease.  8.  History of back surgery x3.   PLAN:  Implantation of an ICD by Dr. Graciela Husbands, March 13, 2004.  The patient  uses high-powered rifles for game hunting; hopefully the device can be  placed on the right side.  The patient is left handed.       GM/MEDQ  D:  03/13/2004  T:  03/13/2004  Job:  [id number]   cc:   Harrel Lemon. Merla Riches, M.D.  [address]  [place]  [place] [address]  Fax: [phone]   Colleen Can. Deborah Chalk, M.D.  Fax: [phone]

## 2010-08-03 ENCOUNTER — Encounter: Payer: Medicare PPO | Admitting: *Deleted

## 2010-08-08 ENCOUNTER — Encounter: Payer: Self-pay | Admitting: *Deleted

## 2010-08-11 ENCOUNTER — Ambulatory Visit (INDEPENDENT_AMBULATORY_CARE_PROVIDER_SITE_OTHER): Payer: Medicare PPO | Admitting: *Deleted

## 2010-08-11 ENCOUNTER — Other Ambulatory Visit: Payer: Self-pay | Admitting: Internal Medicine

## 2010-08-11 DIAGNOSIS — I428 Other cardiomyopathies: Secondary | ICD-10-CM

## 2010-08-11 DIAGNOSIS — Z95810 Presence of automatic (implantable) cardiac defibrillator: Secondary | ICD-10-CM

## 2010-08-14 ENCOUNTER — Encounter: Payer: Self-pay | Admitting: Cardiology

## 2010-08-14 NOTE — Progress Notes (Signed)
icd remote check  

## 2010-08-18 ENCOUNTER — Encounter: Payer: Self-pay | Admitting: *Deleted

## 2010-10-03 ENCOUNTER — Encounter: Payer: Self-pay | Admitting: Cardiology

## 2010-10-31 ENCOUNTER — Encounter: Payer: Self-pay | Admitting: Cardiology

## 2010-10-31 ENCOUNTER — Ambulatory Visit (INDEPENDENT_AMBULATORY_CARE_PROVIDER_SITE_OTHER): Payer: Medicare PPO | Admitting: Cardiology

## 2010-10-31 ENCOUNTER — Ambulatory Visit: Payer: Medicare PPO | Admitting: Cardiology

## 2010-10-31 VITALS — BP 112/84 | HR 64 | Resp 20 | Ht 69.0 in | Wt 287.0 lb

## 2010-10-31 DIAGNOSIS — I2510 Atherosclerotic heart disease of native coronary artery without angina pectoris: Secondary | ICD-10-CM | POA: Insufficient documentation

## 2010-10-31 DIAGNOSIS — I10 Essential (primary) hypertension: Secondary | ICD-10-CM

## 2010-10-31 DIAGNOSIS — E785 Hyperlipidemia, unspecified: Secondary | ICD-10-CM

## 2010-10-31 DIAGNOSIS — I2589 Other forms of chronic ischemic heart disease: Secondary | ICD-10-CM

## 2010-10-31 NOTE — Assessment & Plan Note (Signed)
His LDL was not at target in June of this year (127).  However his meds were changed by his primary provider and he has follow up in Nov.  The goal should be an LDL less than 70 and HDL greater than 50 given his advanced disease at an early age.

## 2010-10-31 NOTE — Progress Notes (Signed)
HPI The patient presents for followup. He has previously been seen by Dr. Deborah Chalk.  He denies any active cardiovascular symptoms.  The patient denies any new symptoms such as chest discomfort, neck or arm discomfort. There has been no new shortness of breath, PND or orthopnea. There have been no reported palpitations, presyncope or syncope.  He has been walking a new dog.  However, recently he started to develop some left foot pain.  He describes symptoms similar consistent with plantar fascitis.  Allergies  Allergen Reactions  . Codeine   . Vioxx (Rofecoxib)     Current Outpatient Prescriptions  Medication Sig Dispense Refill  . albuterol (PROVENTIL HFA;VENTOLIN HFA) 108 (90 BASE) MCG/ACT inhaler Inhale 2 puffs into the lungs as needed.        . ALPRAZolam (XANAX) 1 MG tablet Take 1 mg by mouth as needed.        Marland Kitchen atorvastatin (LIPITOR) 40 MG tablet Take 40 mg by mouth daily.        . citalopram (CELEXA) 20 MG tablet Take 20 mg by mouth daily.        . cyclobenzaprine (FLEXERIL) 10 MG tablet Take 10 mg by mouth daily.        . fexofenadine (ALLEGRA) 180 MG tablet Take 180 mg by mouth daily.        . fluticasone (FLONASE) 50 MCG/ACT nasal spray Place 2 sprays into the nose daily.        . Fluticasone-Salmeterol (ADVAIR) 250-50 MCG/DOSE AEPB Inhale 1 puff into the lungs every 12 (twelve) hours.        . furosemide (LASIX) 40 MG tablet Take 40 mg by mouth daily.        . lansoprazole (PREVACID) 30 MG capsule Take 30 mg by mouth daily.        Marland Kitchen lisinopril (PRINIVIL,ZESTRIL) 10 MG tablet Take 20 mg by mouth daily.       . metoprolol (TOPROL-XL) 50 MG 24 hr tablet Take 50 mg by mouth daily.        Marland Kitchen oxyCODONE-acetaminophen (PERCOCET) 10-650 MG per tablet Take 1 tablet by mouth every 6 (six) hours as needed.        . potassium chloride SA (K-DUR,KLOR-CON) 20 MEQ tablet Take 20 mEq by mouth daily.        . traZODone (DESYREL) 50 MG tablet Take 50 mg by mouth at bedtime.          Past Medical  History  Diagnosis Date  . Hypertension   . Obesity   . Dyslipidemia   . Asthma   . Fatigue   . History of heart attack     times 4    Past Surgical History  Procedure Date  . Coronary artery bypass graft 2002    times 4  . Cardiac catheterization 1996    stent placement    ROS:  As stated in the HPI and negative for all other systems.  PHYSICAL EXAM BP 112/84  Pulse 64  Resp 20  Ht 5\' 9"  (1.753 m)  Wt 287 lb (130.182 kg)  BMI 42.38 kg/m2 GENERAL:  Well appearing HEENT:  Pupils equal round and reactive, fundi not visualized, oral mucosa unremarkable NECK:  No jugular venous distention, waveform within normal limits, carotid upstroke brisk and symmetric, no bruits, no thyromegaly LYMPHATICS:  No cervical, inguinal adenopathy LUNGS:  Clear to auscultation bilaterally BACK:  No CVA tenderness CHEST:  Well healed sternotomy scar and right sided ICD pocket HEART:  PMI  not displaced or sustained,S1 and S2 within normal limits, no S3, no S4, no clicks, no rubs, no murmurs ABD:  Flat, positive bowel sounds normal in frequency in pitch, no bruits, no rebound, no guarding, no midline pulsatile mass, no hepatomegaly, no splenomegaly, obese EXT:  2 plus pulses throughout, no edema, no cyanosis no clubbing SKIN:  No rashes no nodules NEURO:  Cranial nerves II through XII grossly intact, motor grossly intact throughout PSYCH:  Cognitively intact, oriented to person place and time  EKG:  Sinus rhythm, rate 61, lateral infarct and anteroseptal infarct old, no acute ST-T wave changes  ASSESSMENT AND PLAN

## 2010-10-31 NOTE — Assessment & Plan Note (Signed)
The blood pressure is at target. No change in medications is indicated. We will continue with therapeutic lifestyle changes (TLC).  

## 2010-10-31 NOTE — Assessment & Plan Note (Signed)
He seems to be euvolemic.  At this point, no change in therapy is indicated.  We have reviewed salt and fluid restrictions.  No further cardiovascular testing is indicated.   

## 2010-10-31 NOTE — Assessment & Plan Note (Addendum)
At this point he is having no active ischemia.  He reports stress testing in 2010.  No further testing is indicated.  He needs continued risk reduction.

## 2010-10-31 NOTE — Patient Instructions (Signed)
The current medical regimen is effective;  continue present plan and medications.  Follow up in 6 months with Dr Hochrein.  You will receive a letter in the mail 2 months before you are due.  Please call us when you receive this letter to schedule your follow up appointment.  

## 2010-10-31 NOTE — Assessment & Plan Note (Signed)
He has lost 24 pounds from his peak.  I applaud this and I encourage more of the same.

## 2010-11-16 ENCOUNTER — Encounter: Payer: Medicare PPO | Admitting: *Deleted

## 2010-11-19 ENCOUNTER — Encounter: Payer: Self-pay | Admitting: *Deleted

## 2010-11-23 ENCOUNTER — Encounter: Payer: Self-pay | Admitting: Internal Medicine

## 2010-11-23 ENCOUNTER — Other Ambulatory Visit: Payer: Self-pay | Admitting: Internal Medicine

## 2010-11-23 ENCOUNTER — Ambulatory Visit (INDEPENDENT_AMBULATORY_CARE_PROVIDER_SITE_OTHER): Payer: Medicare PPO | Admitting: *Deleted

## 2010-11-23 DIAGNOSIS — I428 Other cardiomyopathies: Secondary | ICD-10-CM

## 2010-11-23 DIAGNOSIS — Z95810 Presence of automatic (implantable) cardiac defibrillator: Secondary | ICD-10-CM

## 2010-11-24 LAB — REMOTE ICD DEVICE
ATRIAL PACING ICD: 18 pct
DEV-0020ICD: NEGATIVE
RV LEAD AMPLITUDE: 19.6 mv
TOT-0006: 20120615000000
TZAT-0001FASTVT: 1
TZAT-0001SLOWVT: 1
TZAT-0001SLOWVT: 5
TZAT-0001SLOWVT: 6
TZAT-0002SLOWVT: NEGATIVE
TZAT-0002SLOWVT: NEGATIVE
TZAT-0002SLOWVT: NEGATIVE
TZAT-0002SLOWVT: NEGATIVE
TZAT-0005FASTVT: 88 pct
TZAT-0011FASTVT: 10 ms
TZAT-0012SLOWVT: 200 ms
TZAT-0012SLOWVT: 200 ms
TZAT-0012SLOWVT: 200 ms
TZAT-0012SLOWVT: 200 ms
TZAT-0018SLOWVT: NEGATIVE
TZAT-0018SLOWVT: NEGATIVE
TZAT-0018SLOWVT: NEGATIVE
TZAT-0018SLOWVT: NEGATIVE
TZAT-0019FASTVT: 8 V
TZAT-0019SLOWVT: 8 V
TZAT-0019SLOWVT: 8 V
TZAT-0019SLOWVT: 8 V
TZAT-0020SLOWVT: 1.6 ms
TZAT-0020SLOWVT: 1.6 ms
TZAT-0020SLOWVT: 1.6 ms
TZON-0008FASTVT: 0 ms
TZST-0001FASTVT: 2
TZST-0001FASTVT: 3
TZST-0001FASTVT: 4
TZST-0001FASTVT: 6
TZST-0003FASTVT: 25 J
TZST-0003FASTVT: 35 J
TZST-0003FASTVT: 35 J
VENTRICULAR PACING ICD: 2 pct

## 2010-11-29 ENCOUNTER — Encounter: Payer: Self-pay | Admitting: *Deleted

## 2010-11-30 NOTE — Progress Notes (Signed)
icd remote check  

## 2010-12-21 ENCOUNTER — Encounter: Payer: Medicare PPO | Admitting: *Deleted

## 2010-12-28 ENCOUNTER — Encounter: Payer: Self-pay | Admitting: *Deleted

## 2011-01-01 ENCOUNTER — Ambulatory Visit (INDEPENDENT_AMBULATORY_CARE_PROVIDER_SITE_OTHER): Payer: Medicare PPO | Admitting: *Deleted

## 2011-01-01 ENCOUNTER — Other Ambulatory Visit: Payer: Self-pay | Admitting: Internal Medicine

## 2011-01-01 ENCOUNTER — Encounter: Payer: Self-pay | Admitting: Internal Medicine

## 2011-01-01 DIAGNOSIS — Z95810 Presence of automatic (implantable) cardiac defibrillator: Secondary | ICD-10-CM

## 2011-01-01 DIAGNOSIS — I428 Other cardiomyopathies: Secondary | ICD-10-CM

## 2011-01-07 LAB — REMOTE ICD DEVICE
AL AMPLITUDE: 2.2 mv
ATRIAL PACING ICD: 30 pct
BATTERY VOLTAGE: 2.65 V
BATTERY VOLTAGE: 2.65 V
CHARGE TIME: 10.11 s
DEV-0020ICD: NEGATIVE
RV LEAD IMPEDENCE ICD: 536 Ohm
TZAT-0001FASTVT: 1
TZAT-0001SLOWVT: 1
TZAT-0001SLOWVT: 2
TZAT-0001SLOWVT: 2
TZAT-0001SLOWVT: 3
TZAT-0001SLOWVT: 4
TZAT-0001SLOWVT: 5
TZAT-0001SLOWVT: 6
TZAT-0001SLOWVT: 6
TZAT-0002SLOWVT: NEGATIVE
TZAT-0002SLOWVT: NEGATIVE
TZAT-0002SLOWVT: NEGATIVE
TZAT-0002SLOWVT: NEGATIVE
TZAT-0002SLOWVT: NEGATIVE
TZAT-0002SLOWVT: NEGATIVE
TZAT-0004FASTVT: 8
TZAT-0011FASTVT: 10 ms
TZAT-0012FASTVT: 200 ms
TZAT-0012FASTVT: 200 ms
TZAT-0012SLOWVT: 200 ms
TZAT-0012SLOWVT: 200 ms
TZAT-0012SLOWVT: 200 ms
TZAT-0012SLOWVT: 200 ms
TZAT-0013FASTVT: 1
TZAT-0018FASTVT: NEGATIVE
TZAT-0018FASTVT: NEGATIVE
TZAT-0018SLOWVT: NEGATIVE
TZAT-0018SLOWVT: NEGATIVE
TZAT-0018SLOWVT: NEGATIVE
TZAT-0018SLOWVT: NEGATIVE
TZAT-0018SLOWVT: NEGATIVE
TZAT-0019FASTVT: 8 V
TZAT-0019FASTVT: 8 V
TZAT-0019SLOWVT: 8 V
TZAT-0019SLOWVT: 8 V
TZAT-0019SLOWVT: 8 V
TZAT-0019SLOWVT: 8 V
TZAT-0019SLOWVT: 8 V
TZAT-0019SLOWVT: 8 V
TZAT-0019SLOWVT: 8 V
TZAT-0020FASTVT: 1.6 ms
TZAT-0020FASTVT: 1.6 ms
TZAT-0020SLOWVT: 1.6 ms
TZAT-0020SLOWVT: 1.6 ms
TZAT-0020SLOWVT: 1.6 ms
TZAT-0020SLOWVT: 1.6 ms
TZAT-0020SLOWVT: 1.6 ms
TZON-0004SLOWVT: 16
TZON-0005SLOWVT: 12
TZON-0005SLOWVT: 12
TZON-0008FASTVT: 0 ms
TZON-0008FASTVT: 0 ms
TZON-0008SLOWVT: 0 ms
TZON-0008SLOWVT: 0 ms
TZST-0001FASTVT: 2
TZST-0001FASTVT: 3
TZST-0001FASTVT: 4
TZST-0001FASTVT: 6
TZST-0003FASTVT: 35 J
TZST-0003FASTVT: 35 J
TZST-0003FASTVT: 35 J
TZST-0003FASTVT: 35 J
VENTRICULAR PACING ICD: 4 pct
VF: 1
VF: 1

## 2011-01-12 NOTE — Progress Notes (Signed)
icd remote check  

## 2011-01-17 ENCOUNTER — Encounter: Payer: Self-pay | Admitting: *Deleted

## 2011-01-22 ENCOUNTER — Telehealth: Payer: Self-pay | Admitting: Cardiology

## 2011-01-22 NOTE — Telephone Encounter (Signed)
Pt next remote transmission scheduled for 02-01-11. Pt aware. Transmission was received on 01-01-11.

## 2011-01-22 NOTE — Telephone Encounter (Signed)
New Msg: Pt call wanting to know if remote pacer check was received. Please return pt call to discuss further.

## 2011-01-26 ENCOUNTER — Other Ambulatory Visit (HOSPITAL_COMMUNITY): Payer: Self-pay | Admitting: Family Medicine

## 2011-01-26 ENCOUNTER — Other Ambulatory Visit: Payer: Self-pay | Admitting: Family Medicine

## 2011-01-26 DIAGNOSIS — M545 Low back pain, unspecified: Secondary | ICD-10-CM

## 2011-01-30 ENCOUNTER — Other Ambulatory Visit: Payer: Medicare PPO

## 2011-01-31 ENCOUNTER — Ambulatory Visit (HOSPITAL_COMMUNITY): Payer: Medicare HMO

## 2011-02-01 ENCOUNTER — Ambulatory Visit (INDEPENDENT_AMBULATORY_CARE_PROVIDER_SITE_OTHER): Payer: Medicare HMO | Admitting: *Deleted

## 2011-02-01 DIAGNOSIS — R0989 Other specified symptoms and signs involving the circulatory and respiratory systems: Secondary | ICD-10-CM

## 2011-02-01 DIAGNOSIS — I428 Other cardiomyopathies: Secondary | ICD-10-CM

## 2011-02-02 ENCOUNTER — Other Ambulatory Visit: Payer: Self-pay | Admitting: Internal Medicine

## 2011-02-02 ENCOUNTER — Encounter: Payer: Self-pay | Admitting: Internal Medicine

## 2011-02-02 ENCOUNTER — Ambulatory Visit (HOSPITAL_COMMUNITY)
Admission: RE | Admit: 2011-02-02 | Discharge: 2011-02-02 | Disposition: A | Discharge status: Home / Self Care | Payer: Medicare HMO | Source: Ambulatory Visit | Attending: Family Medicine | Admitting: Family Medicine

## 2011-02-02 DIAGNOSIS — M5137 Other intervertebral disc degeneration, lumbosacral region: Secondary | ICD-10-CM | POA: Insufficient documentation

## 2011-02-02 DIAGNOSIS — M79609 Pain in unspecified limb: Secondary | ICD-10-CM | POA: Insufficient documentation

## 2011-02-02 DIAGNOSIS — I428 Other cardiomyopathies: Secondary | ICD-10-CM

## 2011-02-02 DIAGNOSIS — M545 Low back pain, unspecified: Secondary | ICD-10-CM | POA: Insufficient documentation

## 2011-02-02 DIAGNOSIS — M51379 Other intervertebral disc degeneration, lumbosacral region without mention of lumbar back pain or lower extremity pain: Secondary | ICD-10-CM | POA: Insufficient documentation

## 2011-02-04 LAB — REMOTE ICD DEVICE
AL AMPLITUDE: 2.7 mv
ATRIAL PACING ICD: 26 pct
BATTERY VOLTAGE: 2.65 V
DEV-0020ICD: NEGATIVE
TOT-0006: 20121105000000
TZAT-0001SLOWVT: 1
TZAT-0001SLOWVT: 2
TZAT-0001SLOWVT: 5
TZAT-0002SLOWVT: NEGATIVE
TZAT-0002SLOWVT: NEGATIVE
TZAT-0002SLOWVT: NEGATIVE
TZAT-0002SLOWVT: NEGATIVE
TZAT-0004FASTVT: 8
TZAT-0012SLOWVT: 200 ms
TZAT-0012SLOWVT: 200 ms
TZAT-0012SLOWVT: 200 ms
TZAT-0012SLOWVT: 200 ms
TZAT-0018SLOWVT: NEGATIVE
TZAT-0019FASTVT: 8 V
TZAT-0019SLOWVT: 8 V
TZAT-0019SLOWVT: 8 V
TZAT-0020FASTVT: 1.6 ms
TZAT-0020SLOWVT: 1.6 ms
TZAT-0020SLOWVT: 1.6 ms
TZAT-0020SLOWVT: 1.6 ms
TZON-0005SLOWVT: 12
TZON-0008FASTVT: 0 ms
TZST-0001FASTVT: 2
TZST-0001FASTVT: 4
TZST-0001FASTVT: 6
TZST-0003FASTVT: 25 J
TZST-0003FASTVT: 35 J
TZST-0003FASTVT: 35 J
TZST-0003FASTVT: 35 J

## 2011-02-13 NOTE — Progress Notes (Signed)
Remote icd check  

## 2011-02-22 ENCOUNTER — Encounter: Payer: Medicare PPO | Admitting: *Deleted

## 2011-02-28 ENCOUNTER — Encounter: Payer: Self-pay | Admitting: *Deleted

## 2011-03-08 ENCOUNTER — Encounter: Payer: Medicare HMO | Admitting: *Deleted

## 2011-03-12 ENCOUNTER — Encounter: Payer: Self-pay | Admitting: *Deleted

## 2011-03-16 ENCOUNTER — Ambulatory Visit (INDEPENDENT_AMBULATORY_CARE_PROVIDER_SITE_OTHER): Payer: Medicare Other | Admitting: *Deleted

## 2011-03-16 ENCOUNTER — Encounter: Payer: Self-pay | Admitting: Internal Medicine

## 2011-03-16 DIAGNOSIS — I428 Other cardiomyopathies: Secondary | ICD-10-CM

## 2011-03-16 DIAGNOSIS — Z95810 Presence of automatic (implantable) cardiac defibrillator: Secondary | ICD-10-CM

## 2011-03-25 LAB — REMOTE ICD DEVICE
ATRIAL PACING ICD: 19 pct
DEV-0020ICD: NEGATIVE
TOT-0006: 20121207000000
TZAT-0001FASTVT: 1
TZAT-0001SLOWVT: 1
TZAT-0001SLOWVT: 5
TZAT-0001SLOWVT: 6
TZAT-0002SLOWVT: NEGATIVE
TZAT-0002SLOWVT: NEGATIVE
TZAT-0002SLOWVT: NEGATIVE
TZAT-0011FASTVT: 10 ms
TZAT-0012SLOWVT: 200 ms
TZAT-0012SLOWVT: 200 ms
TZAT-0012SLOWVT: 200 ms
TZAT-0018SLOWVT: NEGATIVE
TZAT-0018SLOWVT: NEGATIVE
TZAT-0018SLOWVT: NEGATIVE
TZAT-0018SLOWVT: NEGATIVE
TZAT-0019FASTVT: 8 V
TZAT-0019SLOWVT: 8 V
TZAT-0019SLOWVT: 8 V
TZAT-0019SLOWVT: 8 V
TZAT-0020FASTVT: 1.6 ms
TZAT-0020SLOWVT: 1.6 ms
TZAT-0020SLOWVT: 1.6 ms
TZON-0008FASTVT: 0 ms
TZST-0001FASTVT: 2
TZST-0001FASTVT: 3
TZST-0001FASTVT: 4
TZST-0001FASTVT: 6
TZST-0003FASTVT: 25 J
TZST-0003FASTVT: 35 J
TZST-0003FASTVT: 35 J
VENTRICULAR PACING ICD: 2 pct

## 2011-03-26 NOTE — Progress Notes (Signed)
Remote icd check  

## 2011-04-04 ENCOUNTER — Encounter: Payer: Self-pay | Admitting: *Deleted

## 2011-04-19 ENCOUNTER — Ambulatory Visit (INDEPENDENT_AMBULATORY_CARE_PROVIDER_SITE_OTHER): Payer: Medicare Other | Admitting: *Deleted

## 2011-04-19 ENCOUNTER — Encounter: Payer: Self-pay | Admitting: Internal Medicine

## 2011-04-19 DIAGNOSIS — I428 Other cardiomyopathies: Secondary | ICD-10-CM

## 2011-04-19 DIAGNOSIS — Z95810 Presence of automatic (implantable) cardiac defibrillator: Secondary | ICD-10-CM

## 2011-04-20 LAB — REMOTE ICD DEVICE
AL AMPLITUDE: 2.2 mv
ATRIAL PACING ICD: 22 pct
DEV-0020ICD: NEGATIVE
RV LEAD AMPLITUDE: 19.6 mv
RV LEAD IMPEDENCE ICD: 560 Ohm
TOT-0006: 20130118000000
TZAT-0001FASTVT: 1
TZAT-0001SLOWVT: 1
TZAT-0002SLOWVT: NEGATIVE
TZAT-0002SLOWVT: NEGATIVE
TZAT-0005FASTVT: 88 pct
TZAT-0012SLOWVT: 200 ms
TZAT-0012SLOWVT: 200 ms
TZAT-0012SLOWVT: 200 ms
TZAT-0018FASTVT: NEGATIVE
TZAT-0018SLOWVT: NEGATIVE
TZAT-0018SLOWVT: NEGATIVE
TZAT-0018SLOWVT: NEGATIVE
TZAT-0019SLOWVT: 8 V
TZAT-0019SLOWVT: 8 V
TZAT-0019SLOWVT: 8 V
TZAT-0019SLOWVT: 8 V
TZAT-0020SLOWVT: 1.6 ms
TZAT-0020SLOWVT: 1.6 ms
TZAT-0020SLOWVT: 1.6 ms
TZST-0001FASTVT: 2
TZST-0001FASTVT: 5
TZST-0001FASTVT: 6
TZST-0003FASTVT: 25 J
TZST-0003FASTVT: 35 J
TZST-0003FASTVT: 35 J
VENTRICULAR PACING ICD: 2 pct

## 2011-04-24 ENCOUNTER — Encounter: Payer: Self-pay | Admitting: Cardiology

## 2011-04-24 ENCOUNTER — Ambulatory Visit (INDEPENDENT_AMBULATORY_CARE_PROVIDER_SITE_OTHER): Payer: Medicare Other | Admitting: Cardiology

## 2011-04-24 ENCOUNTER — Other Ambulatory Visit: Payer: Medicare Other

## 2011-04-24 DIAGNOSIS — I2510 Atherosclerotic heart disease of native coronary artery without angina pectoris: Secondary | ICD-10-CM

## 2011-04-24 DIAGNOSIS — E785 Hyperlipidemia, unspecified: Secondary | ICD-10-CM

## 2011-04-24 DIAGNOSIS — I10 Essential (primary) hypertension: Secondary | ICD-10-CM

## 2011-04-24 DIAGNOSIS — I2589 Other forms of chronic ischemic heart disease: Secondary | ICD-10-CM

## 2011-04-24 LAB — LIPID PANEL
LDL Cholesterol: 101 mg/dL — ABNORMAL HIGH (ref 0–99)
VLDL: 27.2 mg/dL (ref 0.0–40.0)

## 2011-04-24 NOTE — Patient Instructions (Addendum)
Please have fasting lab work today.  The current medical regimen is effective;  continue present plan and medications.  Please schedule follow up appointment with Dr [name] before leaving today.  Follow up in 6 months with Dr Antoine Poche.  You will receive a letter in the mail 2 months before you are due.  Please call us when you receive this letter to schedule your follow up appointment.

## 2011-04-24 NOTE — Assessment & Plan Note (Signed)
His last stress test was 2010. He's having symptoms. We will continue with risk reduction at this point.

## 2011-04-24 NOTE — Assessment & Plan Note (Signed)
I would like to titrate his medication.  However, his blood pressure fluctuates with some systolics in the 80s. Therefore, I cannot titrate this. He will continue with meds as listed. He does seem to be euvolemic.

## 2011-04-24 NOTE — Progress Notes (Signed)
HPI The patient presents for followup. Since I last saw him he has been doing well from a cardiovascular standpoint. He was diagnosed with plantar fasciitis and herniated lumbar disc. He's been managed for these. This has included treatment with steroids. Because of this he's had some weight gain. However, he's had improvement in his pain and is now starting to exercise more. With this activity he denies any chest pain, neck or arm discomfort. He has had no palpitations, presyncope or syncope. He has had no new shortness of breath, PND or orthopnea. He has had no new weight gain or edema.  Allergies  Allergen Reactions  . Codeine   . Vioxx (Rofecoxib)     Current Outpatient Prescriptions  Medication Sig Dispense Refill  . albuterol (PROVENTIL HFA;VENTOLIN HFA) 108 (90 BASE) MCG/ACT inhaler Inhale 2 puffs into the lungs as needed.        . ALPRAZolam (XANAX) 1 MG tablet Take 1 mg by mouth as needed.        Marland Kitchen atorvastatin (LIPITOR) 40 MG tablet Take 40 mg by mouth daily.        . citalopram (CELEXA) 20 MG tablet Take 20 mg by mouth daily.        . cyclobenzaprine (FLEXERIL) 10 MG tablet Take 10 mg by mouth daily.        . fexofenadine (ALLEGRA) 180 MG tablet Take 180 mg by mouth daily.        . fluticasone (FLONASE) 50 MCG/ACT nasal spray Place 2 sprays into the nose daily.        . Fluticasone-Salmeterol (ADVAIR) 250-50 MCG/DOSE AEPB Inhale 1 puff into the lungs every 12 (twelve) hours.        . furosemide (LASIX) 40 MG tablet Take 40 mg by mouth daily.        . lansoprazole (PREVACID) 30 MG capsule Take 30 mg by mouth daily.        Marland Kitchen lisinopril (PRINIVIL,ZESTRIL) 10 MG tablet Take 20 mg by mouth daily.       . meloxicam (MOBIC) 15 MG tablet Take 15 mg by mouth daily.      . metoprolol (TOPROL-XL) 50 MG 24 hr tablet Take 50 mg by mouth daily.        Marland Kitchen oxyCODONE-acetaminophen (PERCOCET) 10-650 MG per tablet Take 1 tablet by mouth every 6 (six) hours as needed.        . potassium chloride  SA (K-DUR,KLOR-CON) 20 MEQ tablet Take 20 mEq by mouth daily.        . traZODone (DESYREL) 50 MG tablet Take 50 mg by mouth at bedtime.          Past Medical History  Diagnosis Date  . Hypertension   . Obesity   . Dyslipidemia   . Asthma   . Fatigue   . History of heart attack     times 4  . CAD (coronary artery disease)     Past Surgical History  Procedure Date  . Coronary artery bypass graft 2002    times 4  . Cardiac catheterization 1996    stent placement    ROS:  As stated in the HPI and negative for all other systems.  PHYSICAL EXAM BP 139/85  Pulse 63  Ht 5\' 9"  (1.753 m)  Wt 304 lb (137.893 kg)  BMI 44.89 kg/m2 GENERAL:  Well appearing HEENT:  Pupils equal round and reactive, fundi not visualized, oral mucosa unremarkable NECK:  No jugular venous distention, waveform within normal  limits, carotid upstroke brisk and symmetric, no bruits, no thyromegaly LYMPHATICS:  No cervical, inguinal adenopathy LUNGS:  Clear to auscultation bilaterally BACK:  No CVA tenderness CHEST:  Well healed sternotomy scar and right sided ICD pocket HEART:  PMI not displaced or sustained,S1 and S2 within normal limits, no S3, no S4, no clicks, no rubs, no murmurs ABD:  Flat, positive bowel sounds normal in frequency in pitch, no bruits, no rebound, no guarding, no midline pulsatile mass, no hepatomegaly, no splenomegaly, obese EXT:  2 plus pulses throughout, no edema, no cyanosis no clubbing SKIN:  No rashes no nodules NEURO:  Cranial nerves II through XII grossly intact, motor grossly intact throughout PSYCH:  Cognitively intact, oriented to person place and time  EKG:  Atrial paced rhythm with ectopy, rate 63, lateral infarct and anteroseptal infarct old, no acute ST-T wave changes. No change from previous.  04/24/2011  ASSESSMENT AND PLAN

## 2011-04-24 NOTE — Assessment & Plan Note (Signed)
As mentioned his blood pressure is labile. I would not titrate meds.

## 2011-04-24 NOTE — Assessment & Plan Note (Signed)
He will check a fasting lipid profile today with an LDL goal less than 70. He had his Lipitor change last year from Vytorin. I suspect he will need med titration.

## 2011-04-24 NOTE — Assessment & Plan Note (Signed)
The patient understands the need to lose weight with diet and exercise. We have discussed specific strategies for this.  

## 2011-04-26 NOTE — Progress Notes (Signed)
Remote icd check  

## 2011-04-27 ENCOUNTER — Other Ambulatory Visit: Payer: Self-pay | Admitting: *Deleted

## 2011-04-27 DIAGNOSIS — E785 Hyperlipidemia, unspecified: Secondary | ICD-10-CM

## 2011-04-27 MED ORDER — ATORVASTATIN CALCIUM 80 MG PO TABS: 80.0000 mg | ORAL_TABLET | Freq: Every day | ORAL | Status: DC

## 2011-05-23 ENCOUNTER — Ambulatory Visit (INDEPENDENT_AMBULATORY_CARE_PROVIDER_SITE_OTHER): Payer: Medicare Other | Admitting: Internal Medicine

## 2011-05-23 ENCOUNTER — Encounter: Payer: Self-pay | Admitting: Internal Medicine

## 2011-05-23 VITALS — BP 140/85 | HR 75 | Resp 18 | Ht 69.0 in | Wt 298.0 lb

## 2011-05-23 DIAGNOSIS — I472 Ventricular tachycardia, unspecified: Secondary | ICD-10-CM | POA: Insufficient documentation

## 2011-05-23 DIAGNOSIS — I2589 Other forms of chronic ischemic heart disease: Secondary | ICD-10-CM

## 2011-05-23 DIAGNOSIS — Z95810 Presence of automatic (implantable) cardiac defibrillator: Secondary | ICD-10-CM

## 2011-05-23 LAB — ICD DEVICE OBSERVATION
AL AMPLITUDE: 3.2 mv
AL IMPEDENCE ICD: 440 Ohm
AL THRESHOLD: 1 V
ATRIAL PACING ICD: 18 pct
BATTERY VOLTAGE: 2.64 V
CHARGE TIME: 10.91 s
DEV-0020ICD: NEGATIVE
FVT: 0
MODE SWITCH EPISODES: 0
PACEART VT: 0
RV LEAD AMPLITUDE: 19.6 mv
RV LEAD IMPEDENCE ICD: 592 Ohm
RV LEAD THRESHOLD: 1 V
TZAT-0001FASTVT: 1
TZAT-0001SLOWVT: 1
TZAT-0001SLOWVT: 2
TZAT-0001SLOWVT: 3
TZAT-0001SLOWVT: 4
TZAT-0001SLOWVT: 5
TZAT-0001SLOWVT: 6
TZAT-0002SLOWVT: NEGATIVE
TZAT-0002SLOWVT: NEGATIVE
TZAT-0002SLOWVT: NEGATIVE
TZAT-0002SLOWVT: NEGATIVE
TZAT-0002SLOWVT: NEGATIVE
TZAT-0002SLOWVT: NEGATIVE
TZAT-0004FASTVT: 8
TZAT-0005FASTVT: 88 pct
TZAT-0011FASTVT: 10 ms
TZAT-0012FASTVT: 200 ms
TZAT-0012SLOWVT: 200 ms
TZAT-0012SLOWVT: 200 ms
TZAT-0012SLOWVT: 200 ms
TZAT-0012SLOWVT: 200 ms
TZAT-0012SLOWVT: 200 ms
TZAT-0012SLOWVT: 200 ms
TZAT-0013FASTVT: 1
TZAT-0018FASTVT: NEGATIVE
TZAT-0018SLOWVT: NEGATIVE
TZAT-0018SLOWVT: NEGATIVE
TZAT-0018SLOWVT: NEGATIVE
TZAT-0018SLOWVT: NEGATIVE
TZAT-0018SLOWVT: NEGATIVE
TZAT-0018SLOWVT: NEGATIVE
TZAT-0019FASTVT: 8 V
TZAT-0019SLOWVT: 8 V
TZAT-0019SLOWVT: 8 V
TZAT-0019SLOWVT: 8 V
TZAT-0019SLOWVT: 8 V
TZAT-0019SLOWVT: 8 V
TZAT-0019SLOWVT: 8 V
TZAT-0020FASTVT: 1.6 ms
TZAT-0020SLOWVT: 1.6 ms
TZAT-0020SLOWVT: 1.6 ms
TZAT-0020SLOWVT: 1.6 ms
TZAT-0020SLOWVT: 1.6 ms
TZAT-0020SLOWVT: 1.6 ms
TZAT-0020SLOWVT: 1.6 ms
TZON-0003FASTVT: 250 ms
TZON-0003SLOWVT: 340 ms
TZON-0004SLOWVT: 16
TZON-0005SLOWVT: 12
TZON-0008FASTVT: 0 ms
TZON-0008SLOWVT: 0 ms
TZST-0001FASTVT: 2
TZST-0001FASTVT: 3
TZST-0001FASTVT: 4
TZST-0001FASTVT: 5
TZST-0001FASTVT: 6
TZST-0003FASTVT: 25 J
TZST-0003FASTVT: 35 J
TZST-0003FASTVT: 35 J
TZST-0003FASTVT: 35 J
TZST-0003FASTVT: 35 J
VENTRICULAR PACING ICD: 2 pct
VF: 1

## 2011-05-23 NOTE — Assessment & Plan Note (Signed)
He denies anginal symptoms. He will continue his current meds and I have asked him to lose weight.

## 2011-05-23 NOTE — Assessment & Plan Note (Signed)
He has had one episode of VT treated with appropriate ICD shock. Will follow. No new meds for now.

## 2011-05-23 NOTE — Progress Notes (Signed)
HPI Mr. Carlisi returns today for followup. He is a pleasant 56 yo man with an ICM, chronic systolic CHF, VT  And morbid obesity. In the interim,he has received one ICD shock. He denies chest pain and he has class 2 CHF. He walks regularly but has not been able to lose weight. No syncope. Allergies  Allergen Reactions  . Codeine   . Vioxx (Rofecoxib)      Current Outpatient Prescriptions  Medication Sig Dispense Refill  . albuterol (PROVENTIL HFA;VENTOLIN HFA) 108 (90 BASE) MCG/ACT inhaler Inhale 2 puffs into the lungs as needed.        . ALPRAZolam (XANAX) 1 MG tablet Take 1 mg by mouth as needed.        Marland Kitchen atorvastatin (LIPITOR) 80 MG tablet Take 1 tablet (80 mg total) by mouth daily.  30 tablet  6  . citalopram (CELEXA) 20 MG tablet Take 20 mg by mouth daily.        . cyclobenzaprine (FLEXERIL) 10 MG tablet Take 10 mg by mouth daily.        . fexofenadine (ALLEGRA) 180 MG tablet Take 180 mg by mouth daily.        . fluticasone (FLONASE) 50 MCG/ACT nasal spray Place 2 sprays into the nose daily.        . Fluticasone-Salmeterol (ADVAIR) 250-50 MCG/DOSE AEPB Inhale 1 puff into the lungs every 12 (twelve) hours.        . furosemide (LASIX) 40 MG tablet Take 40 mg by mouth daily.        . lansoprazole (PREVACID) 30 MG capsule Take 30 mg by mouth daily.        Marland Kitchen lisinopril (PRINIVIL,ZESTRIL) 10 MG tablet Take 20 mg by mouth daily.       . meloxicam (MOBIC) 15 MG tablet Take 15 mg by mouth daily.      . metoprolol (TOPROL-XL) 50 MG 24 hr tablet Take 50 mg by mouth daily.        Marland Kitchen oxyCODONE-acetaminophen (PERCOCET) 10-650 MG per tablet Take 1 tablet by mouth every 6 (six) hours as needed.        . potassium chloride SA (K-DUR,KLOR-CON) 20 MEQ tablet Take 20 mEq by mouth daily.        . traZODone (DESYREL) 50 MG tablet Take 50 mg by mouth at bedtime.           Past Medical History  Diagnosis Date  . Hypertension   . Obesity   . Dyslipidemia   . Asthma   . Fatigue   . History of heart  attack     times 4  . CAD (coronary artery disease)     ROS:   All systems reviewed and negative except as noted in the HPI.   Past Surgical History  Procedure Date  . Coronary artery bypass graft 2002    times 4  . Cardiac catheterization 1996    stent placement     Family History  Problem Relation Age of Onset  . Heart attack Father      History   Social History  . Marital Status: Divorced    Spouse Name: N/A    Number of Children: N/A  . Years of Education: N/A   Occupational History  . Not on file.   Social History Main Topics  . Smoking status: Former Smoker    Quit date: 04/23/1989  . Smokeless tobacco: Not on file  . Alcohol Use: No  . Drug Use: No  .  Sexually Active: Not on file   Other Topics Concern  . Not on file   Social History Narrative  . No narrative on file     BP 140/85  Pulse 75  Resp 18  Ht 5\' 9"  (1.753 m)  Wt 135.172 kg (298 lb)  BMI 44.01 kg/m2  Physical Exam:  Well appearing obese, middle aged man, NAD HEENT: Unremarkable Neck:  No JVD, no thyromegally Lungs:  Clear with no wheezes, rales, or rhonchi. Well healed ICD incision. HEART:  Regular rate rhythm, no murmurs, no rubs, no clicks Abd:  soft, positive bowel sounds, no organomegally, no rebound, no guarding Ext:  2 plus pulses, no edema, no cyanosis, no clubbing Skin:  No rashes no nodules Neuro:  CN II through XII intact, motor grossly intact  DEVICE  Normal device function.  See PaceArt for details.   Assess/Plan:

## 2011-05-23 NOTE — Patient Instructions (Signed)
Your physician recommends that you schedule a follow-up appointment in: 12 months.  

## 2011-05-23 NOTE — Assessment & Plan Note (Signed)
His device is working normally. Will recheck in several weeks.

## 2011-05-28 ENCOUNTER — Encounter: Payer: Self-pay | Admitting: *Deleted

## 2011-06-20 ENCOUNTER — Other Ambulatory Visit: Payer: Self-pay | Admitting: *Deleted

## 2011-06-20 MED ORDER — FUROSEMIDE 40 MG PO TABS: 40.0000 mg | ORAL_TABLET | Freq: Every day | ORAL | Status: DC

## 2011-06-26 ENCOUNTER — Other Ambulatory Visit (INDEPENDENT_AMBULATORY_CARE_PROVIDER_SITE_OTHER): Payer: Medicare Other

## 2011-06-26 DIAGNOSIS — E785 Hyperlipidemia, unspecified: Secondary | ICD-10-CM

## 2011-06-26 LAB — LIPID PANEL
Cholesterol: 154 mg/dL (ref 0–200)
HDL: 34.7 mg/dL — ABNORMAL LOW (ref >39.00)
Total CHOL/HDL Ratio: 4
Triglycerides: 191 mg/dL — ABNORMAL HIGH (ref 0.0–149.0)

## 2011-08-07 ENCOUNTER — Telehealth: Payer: Self-pay | Admitting: Internal Medicine

## 2011-08-07 ENCOUNTER — Encounter: Payer: Medicare Other | Admitting: *Deleted

## 2011-08-07 ENCOUNTER — Encounter: Payer: Self-pay | Admitting: Internal Medicine

## 2011-08-07 LAB — REMOTE ICD DEVICE
AL IMPEDENCE ICD: 456 Ohm
ATRIAL PACING ICD: 24 pct
CHARGE TIME: 10.91 s
RV LEAD AMPLITUDE: 19.6 mv
RV LEAD IMPEDENCE ICD: 528 Ohm
TZAT-0001SLOWVT: 2
TZAT-0001SLOWVT: 3
TZAT-0001SLOWVT: 6
TZAT-0002SLOWVT: NEGATIVE
TZAT-0002SLOWVT: NEGATIVE
TZAT-0002SLOWVT: NEGATIVE
TZAT-0005FASTVT: 88 pct
TZAT-0011FASTVT: 10 ms
TZAT-0012FASTVT: 200 ms
TZAT-0012SLOWVT: 200 ms
TZAT-0012SLOWVT: 200 ms
TZAT-0012SLOWVT: 200 ms
TZAT-0012SLOWVT: 200 ms
TZAT-0013FASTVT: 1
TZAT-0018FASTVT: NEGATIVE
TZAT-0018SLOWVT: NEGATIVE
TZAT-0018SLOWVT: NEGATIVE
TZAT-0019FASTVT: 8 V
TZAT-0019SLOWVT: 8 V
TZAT-0019SLOWVT: 8 V
TZAT-0019SLOWVT: 8 V
TZAT-0020SLOWVT: 1.6 ms
TZAT-0020SLOWVT: 1.6 ms
TZAT-0020SLOWVT: 1.6 ms
TZAT-0020SLOWVT: 1.6 ms
TZON-0003FASTVT: 250 ms
TZON-0003SLOWVT: 340 ms
TZON-0004SLOWVT: 16
TZON-0005SLOWVT: 12
TZON-0008FASTVT: 0 ms
TZST-0001FASTVT: 3
TZST-0001FASTVT: 5
TZST-0003FASTVT: 35 J
TZST-0003FASTVT: 35 J

## 2011-08-07 NOTE — Telephone Encounter (Signed)
Transmission received. Device reached ERI. Pt aware. Pt scheduled for 08-27-11 @ 1130 with Brooke. Pt aware of appt.

## 2011-08-07 NOTE — Telephone Encounter (Signed)
New problem:  Patient calling C/O hearing beeping sounds. Wanted to let the device clinic he will be doing a download.

## 2011-08-08 ENCOUNTER — Telehealth: Payer: Self-pay | Admitting: Internal Medicine

## 2011-08-08 ENCOUNTER — Encounter: Payer: Self-pay | Admitting: Cardiology

## 2011-08-08 NOTE — Telephone Encounter (Signed)
Spoke w/pt and pt aware that device will beep until feature turned off. Pt would rather wait until 08-27-11 and have it turned off then.

## 2011-08-08 NOTE — Telephone Encounter (Signed)
pt calling re device alarm goes off several times a day, anything he can do? pls call [phone]

## 2011-08-27 ENCOUNTER — Encounter: Payer: Self-pay | Admitting: Cardiology

## 2011-08-27 ENCOUNTER — Ambulatory Visit (INDEPENDENT_AMBULATORY_CARE_PROVIDER_SITE_OTHER): Payer: Medicare Other | Admitting: Cardiology

## 2011-08-27 ENCOUNTER — Encounter: Payer: Self-pay | Admitting: *Deleted

## 2011-08-27 VITALS — BP 143/79 | HR 41 | Ht 69.0 in | Wt 285.0 lb

## 2011-08-27 DIAGNOSIS — Z95810 Presence of automatic (implantable) cardiac defibrillator: Secondary | ICD-10-CM

## 2011-08-27 DIAGNOSIS — I472 Ventricular tachycardia, unspecified: Secondary | ICD-10-CM

## 2011-08-27 DIAGNOSIS — I2510 Atherosclerotic heart disease of native coronary artery without angina pectoris: Secondary | ICD-10-CM

## 2011-08-27 LAB — ICD DEVICE OBSERVATION
AL AMPLITUDE: 2.4 mv
BATTERY VOLTAGE: 2.62 V
DEV-0020ICD: NEGATIVE
TZAT-0001SLOWVT: 1
TZAT-0001SLOWVT: 2
TZAT-0001SLOWVT: 6
TZAT-0002SLOWVT: NEGATIVE
TZAT-0002SLOWVT: NEGATIVE
TZAT-0002SLOWVT: NEGATIVE
TZAT-0011FASTVT: 10 ms
TZAT-0012FASTVT: 200 ms
TZAT-0012SLOWVT: 200 ms
TZAT-0012SLOWVT: 200 ms
TZAT-0012SLOWVT: 200 ms
TZAT-0018FASTVT: NEGATIVE
TZAT-0018SLOWVT: NEGATIVE
TZAT-0018SLOWVT: NEGATIVE
TZAT-0019FASTVT: 8 V
TZAT-0019SLOWVT: 8 V
TZAT-0019SLOWVT: 8 V
TZAT-0019SLOWVT: 8 V
TZAT-0019SLOWVT: 8 V
TZAT-0020SLOWVT: 1.6 ms
TZAT-0020SLOWVT: 1.6 ms
TZAT-0020SLOWVT: 1.6 ms
TZAT-0020SLOWVT: 1.6 ms
TZON-0003FASTVT: 250 ms
TZON-0004SLOWVT: 16
TZON-0005SLOWVT: 12
TZON-0008FASTVT: 0 ms
TZST-0001FASTVT: 3
TZST-0001FASTVT: 6
TZST-0003FASTVT: 35 J

## 2011-08-27 NOTE — Patient Instructions (Signed)
See attached sheet for catheterization instructions.

## 2011-08-28 ENCOUNTER — Encounter (HOSPITAL_COMMUNITY): Payer: Self-pay | Admitting: Pharmacy Technician

## 2011-08-28 LAB — PROTIME-INR
INR: 1.1 ratio — ABNORMAL HIGH (ref 0.8–1.0)
Prothrombin Time: 12.3 s (ref 10.2–12.4)

## 2011-08-28 LAB — CBC WITH DIFFERENTIAL/PLATELET
Basophils Relative: 1.2 % (ref 0.0–3.0)
Eosinophils Absolute: 0.2 10*3/uL (ref 0.0–0.7)
Eosinophils Relative: 1.4 % (ref 0.0–5.0)
Lymphocytes Relative: 32 % (ref 12.0–46.0)
MCHC: 33.1 g/dL (ref 30.0–36.0)
MCV: 94.1 fl (ref 78.0–100.0)
Monocytes Absolute: 1.2 10*3/uL — ABNORMAL HIGH (ref 0.1–1.0)
Neutrophils Relative %: 56.8 % (ref 43.0–77.0)
Platelets: 237 10*3/uL (ref 150.0–400.0)
RBC: 4.74 Mil/uL (ref 4.22–5.81)
WBC: 13.6 10*3/uL — ABNORMAL HIGH (ref 4.5–10.5)

## 2011-08-28 LAB — BASIC METABOLIC PANEL
BUN: 16 mg/dL (ref 6–23)
Calcium: 9.3 mg/dL (ref 8.4–10.5)
Creatinine, Ser: 0.8 mg/dL (ref 0.4–1.5)

## 2011-08-28 NOTE — Progress Notes (Signed)
 ELECTROPHYSIOLOGY OFFICE NOTE  Patient ID: Douglas [name] MRN: [id number], DOB/AGE: 56/18/1957   Date of Visit: 08/28/2011  Primary Physician: WILSON,FRED HENRY, MD Primary Cardiologist: Hochrein, MD Reason for Visit: Device follow-up; battery at ERI  History of Present Illness [name] is a pleasant 56 year old man with an ischemic cardiomyopathy s/p ICD implantation, CAD s/p MI, PCI and CABG who presents today for device follow-up. He reports his device has been "beeping" for the past several days. He sent in a remote transmission which revealed his device is at ERI. He also reports exertional chest "pressure" x 2 weeks. This is accompanied by SOB and diaphoresis. It has occurred almost daily when he walks his dog and feels similar to his previous MI pain. He has not used SL NTG. He reports his pain is relieved with rest. He denies palpitations, dizziness, near syncope or syncope. He denies worsening CHF symptoms, specifically LE swelling, orthopnea or PND.  Past Medical History  Diagnosis Date  . Hypertension   . Obesity   . Dyslipidemia   . Asthma   . Fatigue   . History of heart attack     times 4  . CAD (coronary artery disease) - last cardiac cath was 2002     Past Surgical History  Procedure Date  . Coronary artery bypass graft x 4 2002  . Cardiac catheterization 1996    stent placement    Allergies/Intolerances Allergen Reactions  . Codeine Hives and Itching  . Vioxx (Rofecoxib) Palpitations    "Caused massive heart attack."   Current Home Medications Medication Sig Dispense Refill  . albuterol (PROVENTIL HFA;VENTOLIN HFA) 108 (90 BASE) MCG/ACT inhaler Inhale 2 puffs into the lungs every 6 (six) hours as needed. For wheezing.      . ALPRAZolam (XANAX) 1 MG tablet Take 1 mg by mouth 3 (three) times daily as needed. For anxiety.      . citalopram (CELEXA) 20 MG tablet Take 20 mg by mouth daily.        . cyclobenzaprine (FLEXERIL) 10 MG tablet Take 10 mg by mouth  daily.        . fexofenadine (ALLEGRA) 180 MG tablet Take 180 mg by mouth daily.        . lansoprazole (PREVACID) 30 MG capsule Take 30 mg by mouth daily.        . meloxicam (MOBIC) 15 MG tablet Take 15 mg by mouth daily.      . metoprolol (TOPROL-XL) 50 MG 24 hr tablet Take 50 mg by mouth daily.        . oxyCODONE-acetaminophen (PERCOCET) 10-650 MG per tablet Take 1 tablet by mouth every 6 (six) hours as needed. For pain.      . potassium chloride SA (K-DUR,KLOR-CON) 20 MEQ tablet Take 20 mEq by mouth daily.        . traZODone (DESYREL) 50 MG tablet Take 50 mg by mouth at bedtime as needed. For insomnia.      . aspirin 325 MG tablet Take 325 mg by mouth daily.      . atorvastatin (LIPITOR) 80 MG tablet Take 80 mg by mouth daily.      . furosemide (LASIX) 40 MG tablet Take 40 mg by mouth every morning.      . lisinopril (PRINIVIL,ZESTRIL) 10 MG tablet Take 10 mg by mouth daily.      . Multiple Vitamin (MULTIVITAMIN WITH MINERALS) TABS Take 1 tablet by mouth daily.      .   omega-3 acid ethyl esters (LOVAZA) 1 G capsule Take 1 g by mouth daily.       Social History Social History  . Marital Status: Divorced   Social History Main Topics  . Smoking status: Former Smoker    Quit date: 04/23/1989  . Smokeless tobacco: Not on file  . Alcohol Use: No  . Drug Use: No   Review of Systems General: No chills, fever, night sweats or weight changes Cardiovascular: No dyspnea on exertion, edema, orthopnea, palpitations, paroxysmal nocturnal dyspnea Dermatological: No rash, lesions or masses Respiratory: No cough, dyspnea Urologic: No hematuria, dysuria Abdominal: No nausea, vomiting, diarrhea, bright red blood per rectum, melena, or hematemesis Neurologic: No visual changes, weakness, changes in mental status All other systems reviewed and are otherwise negative except as noted above.  Physical Exam Blood pressure 143/79, pulse 41, height 5' 9" (1.753 m), weight 285 lb (129.275 kg).  General:  Well developed, well appearing 55 year old male in no acute distress. HEENT: Normocephalic, atraumatic. EOMs intact. Sclera nonicteric. Oropharynx clear.  Neck: Supple without bruits. No JVD. Lungs: Respirations regular and unlabored, CTA bilaterally. No wheezes, rales or rhonchi. Heart: RRR. S1, S2 present. No murmurs, rub, S3 or S4. Abdomen: Soft, non-distended. Extremities: No clubbing, cyanosis or edema. DP/PT/Radials 2+ and equal bilaterally. Psych: Normal affect. Neuro: Alert and oriented X 3. Moves all extremities spontaneously.   Diagnostics Device interrogation today show normal ICD function with stable lead parameters/measurements; battery is at ERI; has 6949 lead; see PaceArt report  Assessment and Plan 1. CAD - patient reports exertional chest pain, similar to his previous MI pain; this is concerning for unstable angina and we have recommended a cardiac catheterization for definitive evaluation of his coronary anatomy to rule out CAD progression; the procedure involved was reviewed with the patient in detail, including risks and benefits; these risks include, but are not limited to, death, stroke, bleeding, blood clots, damage to the blood vessels or damage to the kidneys; Mr. Rud expressed verbal understanding and agrees to proceed 2. Ischemic cardiomyopathy s/p ICD implant previously - device battery at ERI; he also has 6949 lead on recall; will plan for ICD generator change and RV lead revision in the next few weeks after his cardiac catheterization; the procedure was reviewed with the patient in detail, including risks and benefits; these risks include, but are not limited to, bleeding, infection, pneumothorax, perforation, tamponade, vascular damage, renal failure, lead dislodgement, MI, stroke and death; Mr. Ostermiller expressed verbal understanding and wishes to proceed  This plan of care was formulated with Dr. Gregg Taylor who was in to interview and examine the patient with me  today.  Signed, Lorenzo Pereyra, PA-C 08/28/2011, 1:50 PM   

## 2011-08-29 ENCOUNTER — Encounter (HOSPITAL_COMMUNITY)
Admission: RE | Disposition: A | Discharge status: Home / Self Care | Payer: Self-pay | Source: Ambulatory Visit | Attending: Cardiology

## 2011-08-29 ENCOUNTER — Encounter (HOSPITAL_COMMUNITY): Payer: Self-pay | Admitting: Internal Medicine

## 2011-08-29 ENCOUNTER — Ambulatory Visit (HOSPITAL_COMMUNITY)
Admission: RE | Admit: 2011-08-29 | Discharge: 2011-08-29 | Disposition: A | Discharge status: Home / Self Care | Payer: Medicare Other | Source: Ambulatory Visit | Attending: Cardiology | Admitting: Cardiology

## 2011-08-29 DIAGNOSIS — I2510 Atherosclerotic heart disease of native coronary artery without angina pectoris: Secondary | ICD-10-CM

## 2011-08-29 DIAGNOSIS — Z95810 Presence of automatic (implantable) cardiac defibrillator: Secondary | ICD-10-CM | POA: Insufficient documentation

## 2011-08-29 DIAGNOSIS — Z951 Presence of aortocoronary bypass graft: Secondary | ICD-10-CM | POA: Insufficient documentation

## 2011-08-29 DIAGNOSIS — E785 Hyperlipidemia, unspecified: Secondary | ICD-10-CM | POA: Insufficient documentation

## 2011-08-29 DIAGNOSIS — I2589 Other forms of chronic ischemic heart disease: Secondary | ICD-10-CM | POA: Insufficient documentation

## 2011-08-29 DIAGNOSIS — E669 Obesity, unspecified: Secondary | ICD-10-CM | POA: Insufficient documentation

## 2011-08-29 DIAGNOSIS — I252 Old myocardial infarction: Secondary | ICD-10-CM | POA: Insufficient documentation

## 2011-08-29 DIAGNOSIS — I472 Ventricular tachycardia: Secondary | ICD-10-CM

## 2011-08-29 HISTORY — PX: LEFT HEART CATHETERIZATION WITH CORONARY ANGIOGRAM: SHX5451

## 2011-08-29 SURGERY — LEFT HEART CATHETERIZATION WITH CORONARY ANGIOGRAM
Anesthesia: LOCAL

## 2011-08-29 MED ORDER — FUROSEMIDE 10 MG/ML IJ SOLN: 20.0000 mg | Freq: Once | INTRAMUSCULAR | Status: DC

## 2011-08-29 MED ORDER — NITROGLYCERIN 0.2 MG/ML ON CALL CATH LAB
INTRAVENOUS | Status: AC
  Filled 2011-08-29: qty 1

## 2011-08-29 MED ORDER — SODIUM CHLORIDE 0.9 % IV SOLN: INTRAVENOUS | Status: DC

## 2011-08-29 MED ORDER — ACETAMINOPHEN 325 MG PO TABS: 650.0000 mg | ORAL_TABLET | ORAL | Status: DC | PRN

## 2011-08-29 MED ORDER — MIDAZOLAM HCL 2 MG/2ML IJ SOLN
INTRAMUSCULAR | Status: AC
  Filled 2011-08-29: qty 2

## 2011-08-29 MED ORDER — SODIUM CHLORIDE 0.9 % IJ SOLN: 3.0000 mL | Freq: Two times a day (BID) | INTRAMUSCULAR | Status: DC

## 2011-08-29 MED ORDER — SODIUM CHLORIDE 0.9 % IV SOLN: 250.0000 mL | INTRAVENOUS | Status: DC | PRN

## 2011-08-29 MED ORDER — SODIUM CHLORIDE 0.9 % IJ SOLN: 3.0000 mL | INTRAMUSCULAR | Status: DC | PRN

## 2011-08-29 MED ORDER — HEPARIN (PORCINE) IN NACL 2-0.9 UNIT/ML-% IJ SOLN
INTRAMUSCULAR | Status: AC
  Filled 2011-08-29: qty 2000

## 2011-08-29 MED ORDER — ONDANSETRON HCL 4 MG/2ML IJ SOLN: 4.0000 mg | Freq: Four times a day (QID) | INTRAMUSCULAR | Status: DC | PRN

## 2011-08-29 MED ORDER — LIDOCAINE HCL (PF) 1 % IJ SOLN
INTRAMUSCULAR | Status: AC
  Filled 2011-08-29: qty 30

## 2011-08-29 MED ORDER — FENTANYL CITRATE 0.05 MG/ML IJ SOLN
INTRAMUSCULAR | Status: AC
  Filled 2011-08-29: qty 2

## 2011-08-29 MED ORDER — SODIUM CHLORIDE 0.9 % IV SOLN
INTRAVENOUS | Status: DC
  Administered 2011-08-29: 12:00:00 via INTRAVENOUS

## 2011-08-29 MED ORDER — ASPIRIN 81 MG PO CHEW: 324.0000 mg | CHEWABLE_TABLET | ORAL | Status: DC

## 2011-08-29 NOTE — Progress Notes (Signed)
DR Donnie Aho NOTIFIED CLIENT DID NOT GET IV LASIX PRIOR TO D/C AND PER DR TILLEY WILL CALL CLIENT AND HAVE HIM TAKE HIS LASIX WHEN HE GETS HOME

## 2011-08-29 NOTE — Interval H&P Note (Signed)
History and Physical Interval Note:  08/29/2011 4:24 PM  Douglas [name]  has presented today for surgery, with the diagnosis of Chest pain  The various methods of treatment have been discussed with the patient and family. After consideration of risks, benefits and other options for treatment, the patient has consented to  Procedure(s) (LRB): LEFT HEART CATHETERIZATION WITH CORONARY ANGIOGRAM (N/A) as a surgical intervention .  The patient's history has been reviewed, patient examined, no change in status, stable for surgery.  I have reviewed the patients' chart and labs.  Questions were answered to the patient's satisfaction.     Daniel Bensimhon

## 2011-08-29 NOTE — Progress Notes (Signed)
CALLED CLIENT AND ADVISED HIM TO TAKE HIS LASIX WHEN HE GETS HOME AND CLIENT VOICED UNDERSTANDING

## 2011-08-29 NOTE — H&P (View-Only) (Signed)
 ELECTROPHYSIOLOGY OFFICE NOTE  Patient ID: Epifanio G Lapp MRN: [id number], DOB/AGE: 09/15/1955   Date of Visit: 08/28/2011  Primary Physician: WILSON,FRED HENRY, MD Primary Cardiologist: Hochrein, MD Reason for Visit: Device follow-up; battery at ERI  History of Present Illness Mr. Leifheit is a pleasant 56 year old man man with an ischemic cardiomyopathy s/p ICD implantation, CAD s/p MI, PCI and CABG who presents today for device follow-up. He reports his device has been "beeping" for the past several days. He sent in a remote transmission which revealed his device is at ERI. He also reports exertional chest "pressure" x 2 weeks. This is accompanied by SOB and diaphoresis. It has occurred almost daily when he walks his dog and feels similar to his previous MI pain. He has not used SL NTG. He reports his pain is relieved with rest. He denies palpitations, dizziness, near syncope or syncope. He denies worsening CHF symptoms, specifically LE swelling, orthopnea or PND.  Past Medical History  Diagnosis Date  . Hypertension   . Obesity   . Dyslipidemia   . Asthma   . Fatigue   . History of heart attack     times 4  . CAD (coronary artery disease) - last cardiac cath was 2002     Past Surgical History  Procedure Date  . Coronary artery bypass graft x 4 2002  . Cardiac catheterization 1996    stent placement    Allergies/Intolerances Allergen Reactions  . Codeine Hives and Itching  . Vioxx (Rofecoxib) Palpitations    "Caused massive heart attack."   Current Home Medications Medication Sig Dispense Refill  . albuterol (PROVENTIL HFA;VENTOLIN HFA) 108 (90 BASE) MCG/ACT inhaler Inhale 2 puffs into the lungs every 6 (six) hours as needed. For wheezing.      . ALPRAZolam (XANAX) 1 MG tablet Take 1 mg by mouth 3 (three) times daily as needed. For anxiety.      . citalopram (CELEXA) 20 MG tablet Take 20 mg by mouth daily.        . cyclobenzaprine (FLEXERIL) 10 MG tablet Take 10 mg by mouth  daily.        . fexofenadine (ALLEGRA) 180 MG tablet Take 180 mg by mouth daily.        . lansoprazole (PREVACID) 30 MG capsule Take 30 mg by mouth daily.        . meloxicam (MOBIC) 15 MG tablet Take 15 mg by mouth daily.      . metoprolol (TOPROL-XL) 50 MG 24 hr tablet Take 50 mg by mouth daily.        . oxyCODONE-acetaminophen (PERCOCET) 10-650 MG per tablet Take 1 tablet by mouth every 6 (six) hours as needed. For pain.      . potassium chloride SA (K-DUR,KLOR-CON) 20 MEQ tablet Take 20 mEq by mouth daily.        . traZODone (DESYREL) 50 MG tablet Take 50 mg by mouth at bedtime as needed. For insomnia.      . aspirin 325 MG tablet Take 325 mg by mouth daily.      . atorvastatin (LIPITOR) 80 MG tablet Take 80 mg by mouth daily.      . furosemide (LASIX) 40 MG tablet Take 40 mg by mouth every morning.      . lisinopril (PRINIVIL,ZESTRIL) 10 MG tablet Take 10 mg by mouth daily.      . Multiple Vitamin (MULTIVITAMIN WITH MINERALS) TABS Take 1 tablet by mouth daily.      .   omega-3 acid ethyl esters (LOVAZA) 1 G capsule Take 1 g by mouth daily.       Social History Social History  . Marital Status: Divorced   Social History Main Topics  . Smoking status: Former Smoker    Quit date: 04/23/1989  . Smokeless tobacco: Not on file  . Alcohol Use: No  . Drug Use: No   Review of Systems General: No chills, fever, night sweats or weight changes Cardiovascular: No dyspnea on exertion, edema, orthopnea, palpitations, paroxysmal nocturnal dyspnea Dermatological: No rash, lesions or masses Respiratory: No cough, dyspnea Urologic: No hematuria, dysuria Abdominal: No nausea, vomiting, diarrhea, bright red blood per rectum, melena, or hematemesis Neurologic: No visual changes, weakness, changes in mental status All other systems reviewed and are otherwise negative except as noted above.  Physical Exam Blood pressure 143/79, pulse 41, height 5' 9" (1.753 m), weight 285 lb (129.275 kg).  General:  Well developed, well appearing 56 year old male male in no acute distress. HEENT: Normocephalic, atraumatic. EOMs intact. Sclera nonicteric. Oropharynx clear.  Neck: Supple without bruits. No JVD. Lungs: Respirations regular and unlabored, CTA bilaterally. No wheezes, rales or rhonchi. Heart: RRR. S1, S2 present. No murmurs, rub, S3 or S4. Abdomen: Soft, non-distended. Extremities: No clubbing, cyanosis or edema. DP/PT/Radials 2+ and equal bilaterally. Psych: Normal affect. Neuro: Alert and oriented X 3. Moves all extremities spontaneously.   Diagnostics Device interrogation today show normal ICD function with stable lead parameters/measurements; battery is at ERI; has 6949 lead; see PaceArt report  Assessment and Plan 1. CAD - patient reports exertional chest pain, similar to his previous MI pain; this is concerning for unstable angina and we have recommended a cardiac catheterization for definitive evaluation of his coronary anatomy to rule out CAD progression; the procedure involved was reviewed with the patient in detail, including risks and benefits; these risks include, but are not limited to, death, stroke, bleeding, blood clots, damage to the blood vessels or damage to the kidneys; Mr. Dunford expressed verbal understanding and agrees to proceed 2. Ischemic cardiomyopathy s/p ICD implant previously - device battery at ERI; he also has 6949 lead on recall; will plan for ICD generator change and RV lead revision in the next few weeks after his cardiac catheterization; the procedure was reviewed with the patient in detail, including risks and benefits; these risks include, but are not limited to, bleeding, infection, pneumothorax, perforation, tamponade, vascular damage, renal failure, lead dislodgement, MI, stroke and death; Mr. Espana expressed verbal understanding and wishes to proceed  This plan of care was formulated with Dr. Gregg Taylor who was in to interview and examine the patient with me  today.  Signed, Harbour Nordmeyer, PA-C 08/28/2011, 1:50 PM   

## 2011-08-29 NOTE — CV Procedure (Signed)
Cardiac Cath Procedure Note:  Indication: Chest pressure  Procedures performed:  1) Selective coronary angiography 2) LIMA angiography 3) SVG angiography x 2 4) Left heart catheterization 5) Left ventriculogram  Description of procedure:   The risks and indication of the procedure were explained. Consent was signed and placed on the chart. An appropriate timeout was taken prior to the procedure. The right groin was prepped and draped in the routine sterile fashion and anesthetized with 1% local lidocaine.   A 5 FR arterial sheath was placed in the right femoral artery using a modified Seldinger technique. Standard catheters including a JL4, JR4 and angled pigtail were used. All catheter exchanges were made over a wire.  Complications:  None apparent  Findings:  Ao Pressure: 114/73 (90) LV Pressure:  122/14/25 There was no signficant gradient across the aortic valve on pullback.  Left main: 30-40% distal  LAD: 80% ostially. Occluded proximally.   LCX: Large vessel. 20-30% proximally. Two distal marginals are occluded  RCA: Occluded proximally.  LIMA-LAD: Patent. Native LAD small and diffusely diseased.   SVG - Diag: Patent. 30-40% ostial narrowing  SVG - OM2 -OM3: Patent. Very mild narrowing in midsection  LV-gram done in the RAO projection: Ejection fraction = ~30% with inferior akinesis  Assessment: 1. 3V CAD with stable revascularization 2. EF 30% with elevated LVEDP  Plan/Discussion: Chest pressure may be related to small vessel ischemia vs elevated LVEDP. Continue medical therapy. Will give 1 dose IV lasix in holding area.  Douglas [name] 4:50 PM

## 2011-08-31 ENCOUNTER — Telehealth: Payer: Self-pay | Admitting: Internal Medicine

## 2011-08-31 NOTE — Telephone Encounter (Signed)
Spoke with pt, he is aware I have talked to amber and she will need to talk to dr taylor to know where to put the pt in the schedule. She will call the pt back on Monday.

## 2011-08-31 NOTE — Telephone Encounter (Signed)
Pt wants to know when he will get his defib changed out since he is at [facility]

## 2011-09-03 ENCOUNTER — Encounter (HOSPITAL_COMMUNITY): Payer: Self-pay | Admitting: Pharmacy Technician

## 2011-09-03 ENCOUNTER — Other Ambulatory Visit: Payer: Self-pay | Admitting: *Deleted

## 2011-09-03 DIAGNOSIS — I428 Other cardiomyopathies: Secondary | ICD-10-CM

## 2011-09-03 NOTE — Telephone Encounter (Signed)
Discussed with Dr [name];  Patient scheduled for ICD generator change on Monday 09-10-2011 at 3:30PM.  He will arrive at [facility] at 1 PM.  Clear liquids then NPO after 9AM.  Patient aware of instructions.

## 2011-09-03 NOTE — Telephone Encounter (Signed)
F/U on previous call.  Patient calling back to speak with nurse - scheduling upcoming procedure.

## 2011-09-09 MED ORDER — DEXTROSE 5 % IV SOLN
3.0000 g | INTRAVENOUS | Status: DC
  Filled 2011-09-09 (×2): qty 30

## 2011-09-09 MED ORDER — SODIUM CHLORIDE 0.9 % IR SOLN
80.0000 mg | Status: DC
  Filled 2011-09-09 (×2): qty 2

## 2011-09-10 ENCOUNTER — Encounter (HOSPITAL_COMMUNITY)
Admission: RE | Disposition: A | Discharge status: Home / Self Care | Payer: Self-pay | Source: Ambulatory Visit | Attending: Internal Medicine

## 2011-09-10 ENCOUNTER — Ambulatory Visit (HOSPITAL_COMMUNITY)
Admission: RE | Admit: 2011-09-10 | Discharge: 2011-09-11 | Disposition: A | Discharge status: Home / Self Care | Payer: Medicare Other | Source: Ambulatory Visit | Attending: Internal Medicine | Admitting: Internal Medicine

## 2011-09-10 DIAGNOSIS — E669 Obesity, unspecified: Secondary | ICD-10-CM | POA: Insufficient documentation

## 2011-09-10 DIAGNOSIS — I252 Old myocardial infarction: Secondary | ICD-10-CM | POA: Insufficient documentation

## 2011-09-10 DIAGNOSIS — E785 Hyperlipidemia, unspecified: Secondary | ICD-10-CM | POA: Insufficient documentation

## 2011-09-10 DIAGNOSIS — I2589 Other forms of chronic ischemic heart disease: Secondary | ICD-10-CM | POA: Insufficient documentation

## 2011-09-10 DIAGNOSIS — I10 Essential (primary) hypertension: Secondary | ICD-10-CM | POA: Insufficient documentation

## 2011-09-10 DIAGNOSIS — Z4502 Encounter for adjustment and management of automatic implantable cardiac defibrillator: Secondary | ICD-10-CM | POA: Insufficient documentation

## 2011-09-10 DIAGNOSIS — I428 Other cardiomyopathies: Secondary | ICD-10-CM

## 2011-09-10 DIAGNOSIS — Z951 Presence of aortocoronary bypass graft: Secondary | ICD-10-CM | POA: Insufficient documentation

## 2011-09-10 DIAGNOSIS — I2510 Atherosclerotic heart disease of native coronary artery without angina pectoris: Secondary | ICD-10-CM | POA: Insufficient documentation

## 2011-09-10 DIAGNOSIS — J45909 Unspecified asthma, uncomplicated: Secondary | ICD-10-CM | POA: Insufficient documentation

## 2011-09-10 HISTORY — PX: IMPLANTABLE CARDIOVERTER DEFIBRILLATOR (ICD) GENERATOR CHANGE: SHX5469

## 2011-09-10 HISTORY — DX: Heart failure, unspecified: I50.9

## 2011-09-10 LAB — CBC
HCT: 37.5 % — ABNORMAL LOW (ref 39.0–52.0)
MCH: 31.6 pg (ref 26.0–34.0)
MCHC: 33.3 g/dL (ref 30.0–36.0)
MCV: 94.7 fL (ref 78.0–100.0)
Platelets: 226 10*3/uL (ref 150–400)
RDW: 14.6 % (ref 11.5–15.5)

## 2011-09-10 SURGERY — ICD GENERATOR CHANGE
Anesthesia: LOCAL

## 2011-09-10 MED ORDER — CHLORHEXIDINE GLUCONATE CLOTH 2 % EX PADS
6.0000 | MEDICATED_PAD | Freq: Every day | CUTANEOUS | Status: DC
  Administered 2011-09-11: 6 via TOPICAL

## 2011-09-10 MED ORDER — CEFAZOLIN SODIUM-DEXTROSE 2-3 GM-% IV SOLR
2.0000 g | Freq: Four times a day (QID) | INTRAVENOUS | Status: AC
  Administered 2011-09-10 – 2011-09-11 (×3): 2 g via INTRAVENOUS
  Filled 2011-09-10 (×4): qty 50

## 2011-09-10 MED ORDER — MUPIROCIN 2 % EX OINT
TOPICAL_OINTMENT | CUTANEOUS | Status: AC
  Administered 2011-09-10: 1 via NASAL
  Filled 2011-09-10: qty 22

## 2011-09-10 MED ORDER — SODIUM CHLORIDE 0.45 % IV SOLN
INTRAVENOUS | Status: DC
  Administered 2011-09-10: 15:00:00 via INTRAVENOUS

## 2011-09-10 MED ORDER — CHLORHEXIDINE GLUCONATE 4 % EX LIQD
60.0000 mL | Freq: Once | CUTANEOUS | Status: DC
  Filled 2011-09-10: qty 60

## 2011-09-10 MED ORDER — OMEGA-3-ACID ETHYL ESTERS 1 G PO CAPS
1.0000 g | ORAL_CAPSULE | Freq: Every day | ORAL | Status: DC
  Administered 2011-09-10 – 2011-09-11 (×2): 1 g via ORAL
  Filled 2011-09-10 (×2): qty 1

## 2011-09-10 MED ORDER — SODIUM CHLORIDE 0.9 % IV SOLN: 250.0000 mL | INTRAVENOUS | Status: DC

## 2011-09-10 MED ORDER — ONDANSETRON HCL 4 MG/2ML IJ SOLN: 4.0000 mg | Freq: Four times a day (QID) | INTRAMUSCULAR | Status: DC | PRN

## 2011-09-10 MED ORDER — CYCLOBENZAPRINE HCL 10 MG PO TABS
10.0000 mg | ORAL_TABLET | Freq: Every day | ORAL | Status: DC
  Administered 2011-09-10 – 2011-09-11 (×2): 10 mg via ORAL
  Filled 2011-09-10 (×2): qty 1

## 2011-09-10 MED ORDER — ALPRAZOLAM 0.5 MG PO TABS: 1.0000 mg | ORAL_TABLET | Freq: Three times a day (TID) | ORAL | Status: DC | PRN

## 2011-09-10 MED ORDER — SODIUM CHLORIDE 0.9 % IJ SOLN: 3.0000 mL | Freq: Two times a day (BID) | INTRAMUSCULAR | Status: DC

## 2011-09-10 MED ORDER — ASPIRIN 325 MG PO TABS
325.0000 mg | ORAL_TABLET | Freq: Every day | ORAL | Status: DC
  Administered 2011-09-10 – 2011-09-11 (×2): 325 mg via ORAL
  Filled 2011-09-10 (×2): qty 1

## 2011-09-10 MED ORDER — MIDAZOLAM HCL 5 MG/5ML IJ SOLN
INTRAMUSCULAR | Status: AC
  Filled 2011-09-10: qty 5

## 2011-09-10 MED ORDER — SODIUM CHLORIDE 0.9 % IJ SOLN: 3.0000 mL | INTRAMUSCULAR | Status: DC | PRN

## 2011-09-10 MED ORDER — FUROSEMIDE 40 MG PO TABS
40.0000 mg | ORAL_TABLET | Freq: Every morning | ORAL | Status: DC
  Administered 2011-09-10 – 2011-09-11 (×2): 40 mg via ORAL
  Filled 2011-09-10 (×2): qty 1

## 2011-09-10 MED ORDER — MELOXICAM 15 MG PO TABS
15.0000 mg | ORAL_TABLET | Freq: Every day | ORAL | Status: DC
  Administered 2011-09-11: 15 mg via ORAL
  Filled 2011-09-10: qty 1

## 2011-09-10 MED ORDER — OXYCODONE-ACETAMINOPHEN 5-325 MG PO TABS
2.0000 | ORAL_TABLET | Freq: Four times a day (QID) | ORAL | Status: DC | PRN
  Administered 2011-09-10 – 2011-09-11 (×3): 2 via ORAL
  Filled 2011-09-10 (×3): qty 2

## 2011-09-10 MED ORDER — CITALOPRAM HYDROBROMIDE 20 MG PO TABS
20.0000 mg | ORAL_TABLET | Freq: Every day | ORAL | Status: DC
Start: 2011-09-11 — End: 2011-09-11
  Administered 2011-09-11: 20 mg via ORAL
  Filled 2011-09-10: qty 1

## 2011-09-10 MED ORDER — POTASSIUM CHLORIDE CRYS ER 20 MEQ PO TBCR
20.0000 meq | EXTENDED_RELEASE_TABLET | Freq: Every day | ORAL | Status: DC
  Administered 2011-09-11: 20 meq via ORAL
  Filled 2011-09-10: qty 1

## 2011-09-10 MED ORDER — LIDOCAINE HCL (PF) 1 % IJ SOLN
INTRAMUSCULAR | Status: AC
  Filled 2011-09-10: qty 60

## 2011-09-10 MED ORDER — CEFAZOLIN SODIUM 1-5 GM-% IV SOLN
INTRAVENOUS | Status: AC
  Filled 2011-09-10: qty 150

## 2011-09-10 MED ORDER — OXYCODONE-ACETAMINOPHEN 10-650 MG PO TABS: 1.0000 | ORAL_TABLET | Freq: Four times a day (QID) | ORAL | Status: DC | PRN

## 2011-09-10 MED ORDER — LISINOPRIL 10 MG PO TABS
10.0000 mg | ORAL_TABLET | Freq: Every day | ORAL | Status: DC
  Administered 2011-09-11: 10 mg via ORAL
  Filled 2011-09-10: qty 1

## 2011-09-10 MED ORDER — MUPIROCIN 2 % EX OINT
1.0000 "application " | TOPICAL_OINTMENT | Freq: Two times a day (BID) | CUTANEOUS | Status: DC
  Administered 2011-09-10 – 2011-09-11 (×2): 1 via NASAL
  Filled 2011-09-10: qty 22

## 2011-09-10 MED ORDER — PANTOPRAZOLE SODIUM 20 MG PO TBEC
20.0000 mg | DELAYED_RELEASE_TABLET | Freq: Every day | ORAL | Status: DC
  Filled 2011-09-10: qty 1

## 2011-09-10 MED ORDER — ADULT MULTIVITAMIN W/MINERALS CH
1.0000 | ORAL_TABLET | Freq: Every day | ORAL | Status: DC
  Administered 2011-09-10 – 2011-09-11 (×2): 1 via ORAL
  Filled 2011-09-10 (×2): qty 1

## 2011-09-10 MED ORDER — FENTANYL CITRATE 0.05 MG/ML IJ SOLN
INTRAMUSCULAR | Status: AC
  Filled 2011-09-10: qty 2

## 2011-09-10 MED ORDER — ATORVASTATIN CALCIUM 80 MG PO TABS
80.0000 mg | ORAL_TABLET | Freq: Every day | ORAL | Status: DC
  Filled 2011-09-10: qty 1

## 2011-09-10 MED ORDER — SODIUM CHLORIDE 0.9 % IR SOLN
Status: DC
  Filled 2011-09-10: qty 2

## 2011-09-10 MED ORDER — METOPROLOL SUCCINATE ER 50 MG PO TB24
50.0000 mg | ORAL_TABLET | Freq: Every day | ORAL | Status: DC
  Administered 2011-09-11: 50 mg via ORAL
  Filled 2011-09-10: qty 1

## 2011-09-10 MED ORDER — ACETAMINOPHEN 325 MG PO TABS: 325.0000 mg | ORAL_TABLET | ORAL | Status: DC | PRN

## 2011-09-10 NOTE — H&P (View-Only) (Signed)
ELECTROPHYSIOLOGY OFFICE NOTE  Patient ID: DAJOUR PIERPOINT MRN: [id number], DOB/AGE: 56-May-1957   Date of Visit: 08/28/2011  Primary Physician: Pamelia Hoit, MD Primary Cardiologist: Antoine Poche, MD Reason for Visit: Device follow-up; battery at [facility]  History of Present Illness Mr. Rampy is a pleasant 56 year old man with an ischemic cardiomyopathy s/p ICD implantation, CAD s/p MI, PCI and CABG who presents today for device follow-up. He reports his device has been "beeping" for the past several days. He sent in a remote transmission which revealed his device is at [facility]. He also reports exertional chest "pressure" x 2 weeks. This is accompanied by SOB and diaphoresis. It has occurred almost daily when he walks his dog and feels similar to his previous MI pain. He has not used SL NTG. He reports his pain is relieved with rest. He denies palpitations, dizziness, near syncope or syncope. He denies worsening CHF symptoms, specifically LE swelling, orthopnea or PND.  Past Medical History  Diagnosis Date  . Hypertension   . Obesity   . Dyslipidemia   . Asthma   . Fatigue   . History of heart attack     times 4  . CAD (coronary artery disease) - last cardiac cath was 2002     Past Surgical History  Procedure Date  . Coronary artery bypass graft x 4 2002  . Cardiac catheterization 1996    stent placement    Allergies/Intolerances Allergen Reactions  . Codeine Hives and Itching  . Vioxx (Rofecoxib) Palpitations    "Caused massive heart attack."   Current Home Medications Medication Sig Dispense Refill  . albuterol (PROVENTIL HFA;VENTOLIN HFA) 108 (90 BASE) MCG/ACT inhaler Inhale 2 puffs into the lungs every 6 (six) hours as needed. For wheezing.      Marland Kitchen ALPRAZolam (XANAX) 1 MG tablet Take 1 mg by mouth 3 (three) times daily as needed. For anxiety.      . citalopram (CELEXA) 20 MG tablet Take 20 mg by mouth daily.        . cyclobenzaprine (FLEXERIL) 10 MG tablet Take 10 mg by mouth  daily.        . fexofenadine (ALLEGRA) 180 MG tablet Take 180 mg by mouth daily.        . lansoprazole (PREVACID) 30 MG capsule Take 30 mg by mouth daily.        . meloxicam (MOBIC) 15 MG tablet Take 15 mg by mouth daily.      . metoprolol (TOPROL-XL) 50 MG 24 hr tablet Take 50 mg by mouth daily.        Marland Kitchen oxyCODONE-acetaminophen (PERCOCET) 10-650 MG per tablet Take 1 tablet by mouth every 6 (six) hours as needed. For pain.      . potassium chloride SA (K-DUR,KLOR-CON) 20 MEQ tablet Take 20 mEq by mouth daily.        . traZODone (DESYREL) 50 MG tablet Take 50 mg by mouth at bedtime as needed. For insomnia.      Marland Kitchen aspirin 325 MG tablet Take 325 mg by mouth daily.      Marland Kitchen atorvastatin (LIPITOR) 80 MG tablet Take 80 mg by mouth daily.      . furosemide (LASIX) 40 MG tablet Take 40 mg by mouth every morning.      Marland Kitchen lisinopril (PRINIVIL,ZESTRIL) 10 MG tablet Take 10 mg by mouth daily.      . Multiple Vitamin (MULTIVITAMIN WITH MINERALS) TABS Take 1 tablet by mouth daily.      Marland Kitchen  omega-3 acid ethyl esters (LOVAZA) 1 G capsule Take 1 g by mouth daily.       Social History Social History  . Marital Status: Divorced   Social History Main Topics  . Smoking status: Former Smoker    Quit date: 04/23/1989  . Smokeless tobacco: Not on file  . Alcohol Use: No  . Drug Use: No   Review of Systems General: No chills, fever, night sweats or weight changes Cardiovascular: No dyspnea on exertion, edema, orthopnea, palpitations, paroxysmal nocturnal dyspnea Dermatological: No rash, lesions or masses Respiratory: No cough, dyspnea Urologic: No hematuria, dysuria Abdominal: No nausea, vomiting, diarrhea, bright red blood per rectum, melena, or hematemesis Neurologic: No visual changes, weakness, changes in mental status All other systems reviewed and are otherwise negative except as noted above.  Physical Exam Blood pressure 143/79, pulse 41, height 5\' 9"  (1.753 m), weight 285 lb (129.275 kg).  General:  Well developed, well appearing 56 year old male in no acute distress. HEENT: Normocephalic, atraumatic. EOMs intact. Sclera nonicteric. Oropharynx clear.  Neck: Supple without bruits. No JVD. Lungs: Respirations regular and unlabored, CTA bilaterally. No wheezes, rales or rhonchi. Heart: RRR. S1, S2 present. No murmurs, rub, S3 or S4. Abdomen: Soft, non-distended. Extremities: No clubbing, cyanosis or edema. DP/PT/Radials 2+ and equal bilaterally. Psych: Normal affect. Neuro: Alert and oriented X 3. Moves all extremities spontaneously.   Diagnostics Device interrogation today show normal ICD function with stable lead parameters/measurements; battery is at [facility]; has 6949 lead; see PaceArt report  Assessment and Plan 1. CAD - patient reports exertional chest pain, similar to his previous MI pain; this is concerning for unstable angina and we have recommended a cardiac catheterization for definitive evaluation of his coronary anatomy to rule out CAD progression; the procedure involved was reviewed with the patient in detail, including risks and benefits; these risks include, but are not limited to, death, stroke, bleeding, blood clots, damage to the blood vessels or damage to the kidneys; Mr. Weisinger expressed verbal understanding and agrees to proceed 2. Ischemic cardiomyopathy s/p ICD implant previously - device battery at [facility]; he also has 6949 lead on recall; will plan for ICD generator change and RV lead revision in the next few weeks after his cardiac catheterization; the procedure was reviewed with the patient in detail, including risks and benefits; these risks include, but are not limited to, bleeding, infection, pneumothorax, perforation, tamponade, vascular damage, renal failure, lead dislodgement, MI, stroke and death; Mr. Kronberg expressed verbal understanding and wishes to proceed  This plan of care was formulated with Dr. [name] who was in to interview and examine the patient with me  today.  Signed, Rick Duff, PA-C 08/28/2011, 1:50 PM

## 2011-09-10 NOTE — Interval H&P Note (Signed)
History and Physical Interval Note:  09/10/2011 1:19 PM  Douglas [name]  has presented today for surgery, with the diagnosis of cm  The various methods of treatment have been discussed with the patient and family. After consideration of risks, benefits and other options for treatment, the patient has consented to  Procedure(s) (LRB): ICD GENERATOR CHANGE and RV lead revision(N/A) as a surgical intervention .  The patient's history has been reviewed, patient examined, no change in status, stable for surgery.  I have reviewed the patients' chart and labs.  Questions were answered to the patient's satisfaction.     [name]

## 2011-09-10 NOTE — Op Note (Signed)
Medtronic ICD removal/insertion of a new lead and ICD with DFT testing without immediate complication. N#[id number].

## 2011-09-11 ENCOUNTER — Encounter (HOSPITAL_COMMUNITY): Payer: Self-pay | Admitting: *Deleted

## 2011-09-11 ENCOUNTER — Ambulatory Visit (HOSPITAL_COMMUNITY): Payer: Medicare Other

## 2011-09-11 DIAGNOSIS — I428 Other cardiomyopathies: Secondary | ICD-10-CM

## 2011-09-11 NOTE — Op Note (Signed)
NAMEVIKRAM, TILLETT NO.:  [id number]  MEDICAL RECORD NO.:  [id number]  LOCATION:  3706                         FACILITY:  [facility]  PHYSICIAN:  Doylene Canning. [name], MD    DATE OF BIRTH:  11/06/1955  DATE OF PROCEDURE:  09/10/2011 DATE OF DISCHARGE:                              OPERATIVE REPORT   PROCEDURE PERFORMED:  Removal of previously implanted ICD, which has reached elective replacement, insertion of a new ICD lead and insertion of new ICD with defibrillation threshold testing.  INTRODUCTION:  The patient is a very pleasant 56 year old man with longstanding ischemic cardiomyopathy status post ICD insertion.  He has a 6949 lead.  He is now referred as he has reached elective replacement for removal his old device, insertion of a new one with insertion of a new ICD lead.  PROCEDURE:  After informed consent was obtained, the patient was taken to the diagnostic EP lab in a fasting state.  After usual preparation and draping, intravenous fentanyl and midazolam was given for sedation. 30 mL of lidocaine was infiltrated into the right infraclavicular region.  A 7-cm incision was carried out over this region. Electrocautery was utilized to dissect down to the fascia plane.  Care was taken not to enter the ICD pocket.  The right subclavian vein was punctured after 10 mL of IV contrast was injected demonstrating that it was in fact patent.  Previous attempt to cannulate the right subclavian vein had been unsuccessful.  A Medtronic model 6935, 58 cm active fixation defibrillation lead, serial number [id number] was advanced into the right ventricle.  At the final site on the RV apical septum, the R- waves measured 20 mV and the pacing impedance was 800 ohms.  There was large injury current with active fixation of the lead and the threshold was 1 volt at 0.5 msec.  10-volt pacing did not stimulate the diaphragm. With these satisfactory parameters, the lead was secured to  the subpectoral fascia with a figure-of-eight silk suture and the sewing sleeves were secured with silk suture.  Electrocautery was then utilized to enter the ICD pocket.  The old device was removed.  The old defibrillation lead was capped.  The atrial lead was found to be working satisfactorily.  The pocket was irrigated.  New Medtronic dual-chamber ICD, serial number [id number] was connected to the old atrial lead and a new defibrillator lead and placed back in the subcutaneous pocket.  The pocket was irrigated with additional antibiotic irrigation and the incision was closed with 2-0 and 3-0 Vicryl.  At this point, I scrubbed out of the case and supervised defibrillation threshold testing.  After the patient was more deeply sedated with fentanyl and Versed, VF was induced with a T-wave shock.  A 20-joule shock was then delivered, which terminated ventricular fibrillation and restored sinus rhythm.  No additional defibrillation threshold testing was carried out, and benzoin and Steri-Strips were painted on the skin, pressure dressing was applied, and the patient was returned to his room in satisfactory condition.  COMPLICATIONS:  There were no immediate procedure complications.  RESULTS:  This demonstrates successful removal of a previously implanted Medtronic dual-chamber ICD followed by insertion of a new  device with insertion of a new ICD lead in a patient with a previously implanted 6949 Medtronic lead.     Doylene Canning. [name], MD     GWT/MEDQ  D:  09/10/2011  T:  09/11/2011  Job:  [id number]

## 2011-09-11 NOTE — Discharge Summary (Signed)
ELECTROPHYSIOLOGY DISCHARGE SUMMARY    Patient ID: Douglas [name],  MRN: [id number], DOB/AGE: 1956-01-02 56 y.o.  Admit date: 09/10/2011 Discharge date: 09/11/2011  Primary Care Physician: Pamelia Hoit, MD Primary Cardiologist: Antoine Poche, MD  Primary Discharge Diagnosis:  1. ICD battery at [facility], [id number] lead recall s/p ICD generator change, RV lead revision and DFT 09/10/2011  Secondary Discharge Diagnoses:  1. Ischemic CM 2. Chronic systolic CHF 3. CAD s/p PCI, CABG 4. HTN 5. Dyslipidemia 6. Obesity  Procedures This Admission:  1. ICD generator change and RV lead revision 09/10/2011 - please see operative report for full details Medtronic model 6935, 58 cm active fixation defibrillation lead, serial number [id number] was advanced into the right ventricle. At the final site on the RV apical septum, the R-waves measured 20 mV and the pacing impedance was 800 ohms. There was large injury current with active fixation of the lead and the threshold was 1 volt at 0.5 msec. 10-volt pacing did not stimulate the diaphragm. With these satisfactory parameters, the lead was secured to the subpectoral fascia. The old device was removed. The old defibrillation lead was capped. The atrial lead was found to be working satisfactorily. The pocket was irrigated. New Medtronic dual-chamber ICD, serial number [id number] was connected to the old atrial lead and a new defibrillator lead and placed back in the subcutaneous pocket. DFT 20J.  History and Hospital Course: Douglas [name] is a pleasant 56 year old man with an ischemic cardiomyopathy s/p dual chamber ICD implantation, CAD s/p MI, PCI and CABG who presented 08/28/2011 for device follow-up. He reported his device had been "beeping" for several days. He sent in a remote transmission which revealed his device was at [facility]. He also had a 6949 RV lead. Of note, also at that visit, he also reported symptoms concerning for [place] and subsequently underwent cardiac  catheterization which revealed stable CAD. He was then scheduled for ICD generator change with RV lead revision which was performed yesterday by Dr. [name]. The patient tolerated this procedure well without any immediate complication. He remains hemodynamically stable and afebrile. His chest xray shows stable lead placement without pneumothorax. His device interrogation shows normal ICD function with stable lead parameters/measurements. His implant site is intact without significant bleeding or hematoma. He has been given discharge instructions including wound care and activity restrictions. He will follow-up in 10 days for wound check. There were no changes made to his medications. He has been seen, examined and deemed stable for discharge today by Dr. [name].  Discharge Vitals: Blood pressure 94/54, pulse 61, temperature 97.7 F (36.5 C), temperature source Oral, resp. rate 18, height 5\' 9"  (1.753 m), weight 285 lb (129.275 kg), SpO2 97.00%.   Labs: Lab Results  Component Value Date   WBC 9.6 09/10/2011   HGB 12.5* 09/10/2011   HCT 37.5* 09/10/2011   MCV 94.7 09/10/2011   PLT 226 09/10/2011    No results found for this basename: NA,K,CL,CO2,BUN,CREATININE,CALCIUM,LABALBU,PROT,BILITOT,ALKPHOS,ALT,AST,GLUCOSE in the last 168 hours   Disposition:  The patient is being discharged in stable condition.  Follow-up: Follow-up Information    Follow up with [name], MD on 12/20/2011. (At 9:00 AM)    Contact information:   [address] [place] [address] [phone]      Follow up with [facility] on 09/19/2011. (At 11:30 AM for wound check)    Contact information:   [address]  [address] [place] [address] [phone]  Discharge Medications:  Medication List  As of 09/11/2011 10:37 AM   TAKE these medications         albuterol 108 (90 BASE) MCG/ACT inhaler   Commonly known as: PROVENTIL HFA;VENTOLIN HFA   Inhale 2 puffs into  the lungs every 6 (six) hours as needed. For wheezing.      ALPRAZolam 1 MG tablet   Commonly known as: XANAX   Take 1 mg by mouth 3 (three) times daily as needed. For anxiety.      aspirin 325 MG tablet   Take 325 mg by mouth daily.      atorvastatin 80 MG tablet   Commonly known as: LIPITOR   Take 80 mg by mouth daily.      citalopram 20 MG tablet   Commonly known as: CELEXA   Take 20 mg by mouth daily.      cyclobenzaprine 10 MG tablet   Commonly known as: FLEXERIL   Take 10 mg by mouth daily.      fexofenadine 180 MG tablet   Commonly known as: ALLEGRA   Take 180 mg by mouth daily.      furosemide 40 MG tablet   Commonly known as: LASIX   Take 40 mg by mouth every morning.      lansoprazole 30 MG capsule   Commonly known as: PREVACID   Take 30 mg by mouth daily.      lisinopril 10 MG tablet   Commonly known as: PRINIVIL,ZESTRIL   Take 10 mg by mouth daily.      meloxicam 15 MG tablet   Commonly known as: MOBIC   Take 15 mg by mouth daily.      metoprolol succinate 50 MG 24 hr tablet   Commonly known as: TOPROL-XL   Take 50 mg by mouth daily.      multivitamin with minerals Tabs   Take 1 tablet by mouth daily.      omega-3 acid ethyl esters 1 G capsule   Commonly known as: LOVAZA   Take 1 g by mouth daily.      oxyCODONE-acetaminophen 10-650 MG per tablet   Commonly known as: PERCOCET   Take 1 tablet by mouth every 6 (six) hours as needed. For pain.      potassium chloride SA 20 MEQ tablet   Commonly known as: K-DUR,KLOR-CON   Take 20 mEq by mouth daily.      Duration of Discharge Encounter: Greater than 30 minutes including physician time.  Signed, Rick Duff, PA-C 09/11/2011, 10:37 AM

## 2011-09-11 NOTE — Progress Notes (Signed)
Patient ID: Douglas [name], male   DOB: [birth year], 56 y.o.   MRN: [id number] HPI S/p ICD lead/generator revision. Stable overnight. No chest pain or sob. Minimal incisional pain. Allergies  Allergen Reactions  . Codeine Hives and Itching  . Vioxx (Rofecoxib) Palpitations    "Caused massive heart attack."     Current Facility-Administered Medications  Medication Dose Route Frequency Provider Last Rate Last Dose  . acetaminophen (TYLENOL) tablet 325-650 mg  325-650 mg Oral Q4H PRN Marinus Maw, MD      . ALPRAZolam Prudy Feeler) tablet 1 mg  1 mg Oral TID PRN Marinus Maw, MD      . aspirin tablet 325 mg  325 mg Oral Daily Marinus Maw, MD   325 mg at 09/11/11 0948  . atorvastatin (LIPITOR) tablet 80 mg  80 mg Oral q1800 Marinus Maw, MD      . ceFAZolin (ANCEF) 1-5 GM-% IVPB           . ceFAZolin (ANCEF) IVPB 2 g/50 mL premix  2 g Intravenous Q6H Marinus Maw, MD   2 g at 09/11/11 0615  . Chlorhexidine Gluconate Cloth 2 % PADS 6 each  6 each Topical Q0600 Marinus Maw, MD   6 each at 09/11/11 0615  . citalopram (CELEXA) tablet 20 mg  20 mg Oral Daily Marinus Maw, MD   20 mg at 09/11/11 0948  . cyclobenzaprine (FLEXERIL) tablet 10 mg  10 mg Oral Daily Marinus Maw, MD   10 mg at 09/11/11 0948  . fentaNYL (SUBLIMAZE) 0.05 MG/ML injection           . fentaNYL (SUBLIMAZE) 0.05 MG/ML injection           . furosemide (LASIX) tablet 40 mg  40 mg Oral q morning - 10a Marinus Maw, MD   40 mg at 09/11/11 0948  . lidocaine (XYLOCAINE) 1 % injection           . lisinopril (PRINIVIL,ZESTRIL) tablet 10 mg  10 mg Oral Daily Marinus Maw, MD   10 mg at 09/11/11 0948  . meloxicam (MOBIC) tablet 15 mg  15 mg Oral Daily Marinus Maw, MD   15 mg at 09/11/11 0948  . metoprolol succinate (TOPROL-XL) 24 hr tablet 50 mg  50 mg Oral Daily Marinus Maw, MD   50 mg at 09/11/11 0948  . midazolam (VERSED) 5 MG/5ML injection           . midazolam (VERSED) 5 MG/5ML injection           . midazolam  (VERSED) 5 MG/5ML injection           . multivitamin with minerals tablet 1 tablet  1 tablet Oral Daily Marinus Maw, MD   1 tablet at 09/11/11 0948  . mupirocin ointment (BACTROBAN) 2 % 1 application  1 application Nasal BID Marinus Maw, MD   1 application at 09/11/11 [id number]  . omega-3 acid ethyl esters (LOVAZA) capsule 1 g  1 g Oral Daily Marinus Maw, MD   1 g at 09/11/11 0948  . ondansetron (ZOFRAN) injection 4 mg  4 mg Intravenous Q6H PRN Marinus Maw, MD      . oxyCODONE-acetaminophen [facility]) 5-325 MG per tablet 2 tablet  2 tablet Oral Q6H PRN Marinus Maw, MD   2 tablet at 09/11/11 0954  . pantoprazole (PROTONIX) EC tablet 20 mg  20 mg Oral Q1200 Doylene Canning  [name], MD      . potassium chloride SA (K-DUR,KLOR-CON) CR tablet 20 mEq  20 mEq Oral Daily Marinus Maw, MD   20 mEq at 09/11/11 0948  . DISCONTD: 0.45 % sodium chloride infusion   Intravenous Continuous Marinus Maw, MD 50 mL/hr at 09/10/11 1433    . DISCONTD: 0.9 %  sodium chloride infusion  250 mL Intravenous Continuous Marinus Maw, MD      . DISCONTD: ceFAZolin (ANCEF) 3 g in dextrose 5 % 50 mL IVPB  3 g Intravenous On Call Marinus Maw, MD      . DISCONTD: chlorhexidine (HIBICLENS) 4 % liquid 4 application  60 mL Topical Once Marinus Maw, MD      . DISCONTD: fentaNYL (SUBLIMAZE) 0.05 MG/ML injection           . DISCONTD: gentamicin (GARAMYCIN) 80 mg in sodium chloride irrigation 0.9 % 500 mL irrigation  80 mg Irrigation On Call Marinus Maw, MD      . DISCONTD: gentamicin (GARAMYCIN) 80 mg in sodium chloride irrigation 0.9 % 500 mL irrigation   Irrigation To Cath Marinus Maw, MD      . DISCONTD: midazolam (VERSED) 5 MG/5ML injection           . DISCONTD: oxyCODONE-acetaminophen (PERCOCET) 10-650 MG per tablet 1 tablet  1 tablet Oral Q6H PRN Marinus Maw, MD      . DISCONTD: sodium chloride 0.9 % injection 3 mL  3 mL Intravenous Q12H Marinus Maw, MD      . DISCONTD: sodium chloride 0.9 % injection 3 mL   3 mL Intravenous PRN Marinus Maw, MD         Past Medical History  Diagnosis Date  . Hypertension   . Obesity   . Dyslipidemia   . Asthma   . Fatigue   . History of heart attack     times 4  . CAD (coronary artery disease)   . CHF (congestive heart failure)   . Pacemaker   . Myocardial infarction     ROS:   All systems reviewed and negative except as noted in the HPI.   Past Surgical History  Procedure Date  . Coronary artery bypass graft 2002    times 4  . Cardiac catheterization 1996    stent placement  . Insert / replace / remove pacemaker      Family History  Problem Relation Age of Onset  . Heart attack Father      History   Social History  . Marital Status: Divorced    Spouse Name: N/A    Number of Children: N/A  . Years of Education: N/A   Occupational History  . Not on file.   Social History Main Topics  . Smoking status: Former Smoker    Quit date: 04/23/1989  . Smokeless tobacco: Not on file  . Alcohol Use: No  . Drug Use: No  . Sexually Active: Not on file   Other Topics Concern  . Not on file   Social History Narrative  . No narrative on file     BP 94/54  Pulse 61  Temp 97.7 F (36.5 C) (Oral)  Resp 18  Ht 5\' 9"  (1.753 m)  Wt 285 lb (129.275 kg)  BMI 42.09 kg/m2  SpO2 97%  Physical Exam:  Well appearing obese middle aged man, NAD HEENT: Unremarkable Neck:  No JVD, no thyromegally Lungs:  Clear with no wheezes,  rales, or rhonchi. No hematoma. HEART:  Regular rate rhythm, no murmurs, no rubs, no clicks Abd:  soft, positive bowel sounds, no organomegally, no rebound, no guarding Ext:  2 plus pulses, no edema, no cyanosis, no clubbing Skin:  No rashes no nodules Neuro:  CN II through XII intact, motor grossly intact  DEVICE  Normal device function.  See PaceArt for details.   Assess/Plan:  1. S/p icd removal and insertion of a new device and ICD lead 2. Chronic systolic CHF 3. Morbid obesity Rec: Ok to  discharge home. Usual followup with no change in meds.

## 2011-09-19 ENCOUNTER — Ambulatory Visit (INDEPENDENT_AMBULATORY_CARE_PROVIDER_SITE_OTHER): Payer: Medicare Other | Admitting: *Deleted

## 2011-09-19 ENCOUNTER — Encounter: Payer: Self-pay | Admitting: Internal Medicine

## 2011-09-19 DIAGNOSIS — Z95810 Presence of automatic (implantable) cardiac defibrillator: Secondary | ICD-10-CM

## 2011-09-19 DIAGNOSIS — I472 Ventricular tachycardia: Secondary | ICD-10-CM

## 2011-09-19 LAB — ICD DEVICE OBSERVATION
AL IMPEDENCE ICD: 418 Ohm
AL THRESHOLD: 1.25 V
ATRIAL PACING ICD: 24.02 pct
BATTERY VOLTAGE: 3.2037 V
CHARGE TIME: 3.583 s
TOT-0002: 0
TOT-0006: 20130715000000
TZAT-0001ATACH: 1
TZAT-0001SLOWVT: 1
TZAT-0001SLOWVT: 2
TZAT-0002ATACH: NEGATIVE
TZAT-0002ATACH: NEGATIVE
TZAT-0002FASTVT: NEGATIVE
TZAT-0011SLOWVT: 10 ms
TZAT-0011SLOWVT: 10 ms
TZAT-0012ATACH: 150 ms
TZAT-0013SLOWVT: 2
TZAT-0018ATACH: NEGATIVE
TZAT-0018ATACH: NEGATIVE
TZAT-0018SLOWVT: NEGATIVE
TZAT-0018SLOWVT: NEGATIVE
TZAT-0019ATACH: 6 V
TZAT-0019ATACH: 6 V
TZAT-0019SLOWVT: 8 V
TZAT-0019SLOWVT: 8 V
TZAT-0020ATACH: 1.5 ms
TZON-0005SLOWVT: 12
TZST-0001ATACH: 5
TZST-0001FASTVT: 3
TZST-0001FASTVT: 5
TZST-0001SLOWVT: 4
TZST-0002ATACH: NEGATIVE
TZST-0002ATACH: NEGATIVE
TZST-0002ATACH: NEGATIVE
TZST-0002FASTVT: NEGATIVE
TZST-0002FASTVT: NEGATIVE
TZST-0003SLOWVT: 35 J
TZST-0003SLOWVT: 35 J
TZST-0003SLOWVT: 35 J
VENTRICULAR PACING ICD: 0.06 pct

## 2011-09-19 NOTE — Progress Notes (Signed)
Wound check defib in clinic  

## 2011-12-12 ENCOUNTER — Encounter: Payer: Self-pay | Admitting: *Deleted

## 2011-12-17 ENCOUNTER — Encounter: Payer: Self-pay | Admitting: Internal Medicine

## 2011-12-17 ENCOUNTER — Ambulatory Visit (INDEPENDENT_AMBULATORY_CARE_PROVIDER_SITE_OTHER): Payer: Medicare Other | Admitting: Internal Medicine

## 2011-12-17 VITALS — BP 130/86 | HR 64 | Ht 69.0 in | Wt 275.0 lb

## 2011-12-17 DIAGNOSIS — I472 Ventricular tachycardia, unspecified: Secondary | ICD-10-CM

## 2011-12-17 DIAGNOSIS — Z95810 Presence of automatic (implantable) cardiac defibrillator: Secondary | ICD-10-CM

## 2011-12-17 DIAGNOSIS — I2589 Other forms of chronic ischemic heart disease: Secondary | ICD-10-CM

## 2011-12-17 LAB — ICD DEVICE OBSERVATION
AL AMPLITUDE: 2.25 mv
BAMS-0001: 170 {beats}/min
DEV-0020ICD: NEGATIVE
FVT: 0
HV IMPEDENCE: 64 Ohm
PACEART VT: 0
RV LEAD AMPLITUDE: 31 mv
RV LEAD IMPEDENCE ICD: 551 Ohm
RV LEAD THRESHOLD: 0.5 V
TOT-0001: 1
TZAT-0001ATACH: 2
TZAT-0002ATACH: NEGATIVE
TZAT-0004SLOWVT: 8
TZAT-0004SLOWVT: 8
TZAT-0005SLOWVT: 88 pct
TZAT-0005SLOWVT: 91 pct
TZAT-0011SLOWVT: 10 ms
TZAT-0012ATACH: 150 ms
TZAT-0012ATACH: 150 ms
TZAT-0012FASTVT: 170 ms
TZAT-0012SLOWVT: 170 ms
TZAT-0012SLOWVT: 170 ms
TZAT-0018ATACH: NEGATIVE
TZAT-0018FASTVT: NEGATIVE
TZAT-0019FASTVT: 8 V
TZAT-0020ATACH: 1.5 ms
TZAT-0020ATACH: 1.5 ms
TZAT-0020FASTVT: 1.5 ms
TZAT-0020SLOWVT: 1.5 ms
TZAT-0020SLOWVT: 1.5 ms
TZON-0003ATACH: 350 ms
TZON-0003SLOWVT: 300 ms
TZON-0003VSLOWVT: 450 ms
TZON-0004SLOWVT: 16
TZON-0004VSLOWVT: 20
TZST-0001ATACH: 4
TZST-0001ATACH: 6
TZST-0001FASTVT: 2
TZST-0001FASTVT: 6
TZST-0001SLOWVT: 3
TZST-0001SLOWVT: 5
TZST-0002FASTVT: NEGATIVE
TZST-0002FASTVT: NEGATIVE
TZST-0002FASTVT: NEGATIVE
TZST-0003SLOWVT: 35 J
VF: 0

## 2011-12-17 NOTE — Patient Instructions (Signed)
Your physician recommends that you schedule a follow-up appointment in: July with Dr [name] and Remote Jan 27th

## 2011-12-17 NOTE — Assessment & Plan Note (Signed)
He has lost 25 pounds in the last 6 months. I have congratulated him on his accomplishment, and encourage the patient to continue his increased physical activity and dietary restriction.

## 2011-12-17 NOTE — Assessment & Plan Note (Signed)
His Medtronic ICD is working normally. We'll plan to recheck in several months. 

## 2011-12-17 NOTE — Progress Notes (Signed)
HPI [name] returns today for followup. He is a very pleasant 56 year old man with an ischemic cardiomyopathy, chronic systolic heart failure, status post ICD implantation. In the interim, he has done well. In the interim, he has done well. He has lost almost 25 pounds in 6 months. He has increased his physical activity, and he has avoided sugar. He denies chest pain, shortness of breath, or ICD shock. No syncope and no peripheral edema. Allergies  Allergen Reactions  . Codeine Hives and Itching  . Vioxx (Rofecoxib) Palpitations    "Caused massive heart attack."     Current Outpatient Prescriptions  Medication Sig Dispense Refill  . albuterol (PROVENTIL HFA;VENTOLIN HFA) 108 (90 BASE) MCG/ACT inhaler Inhale 2 puffs into the lungs every 6 (six) hours as needed. For wheezing.      Marland Kitchen ALPRAZolam (XANAX) 1 MG tablet Take 1 mg by mouth 3 (three) times daily as needed. For anxiety.      Marland Kitchen aspirin 325 MG tablet Take 325 mg by mouth daily.      Marland Kitchen atorvastatin (LIPITOR) 80 MG tablet Take 80 mg by mouth daily.      . citalopram (CELEXA) 20 MG tablet Take 20 mg by mouth daily.        . cyclobenzaprine (FLEXERIL) 10 MG tablet Take 10 mg by mouth daily.        . fexofenadine (ALLEGRA) 180 MG tablet Take 180 mg by mouth daily.        . furosemide (LASIX) 40 MG tablet Take 40 mg by mouth every morning.      . lansoprazole (PREVACID) 30 MG capsule Take 30 mg by mouth daily.        Marland Kitchen lisinopril (PRINIVIL,ZESTRIL) 10 MG tablet Take 10 mg by mouth daily.      . meloxicam (MOBIC) 15 MG tablet Take 15 mg by mouth daily.      . metoprolol (TOPROL-XL) 50 MG 24 hr tablet Take 50 mg by mouth daily.        . Multiple Vitamin (MULTIVITAMIN WITH MINERALS) TABS Take 1 tablet by mouth daily.      . Omega-3 Fatty Acids (FISH OIL) 1200 MG CAPS Take 1,200 mg by mouth daily.      Marland Kitchen oxyCODONE-acetaminophen (PERCOCET) 10-650 MG per tablet Take 1 tablet by mouth every 6 (six) hours as needed. For pain.      . potassium  chloride SA (K-DUR,KLOR-CON) 20 MEQ tablet Take 20 mEq by mouth daily.        Marland Kitchen tiZANidine (ZANAFLEX) 4 MG tablet Take 4 mg by mouth 2 (two) times daily.       . traZODone (DESYREL) 50 MG tablet Take 50 mg by mouth at bedtime.         Past Medical History  Diagnosis Date  . Hypertension   . Obesity   . Dyslipidemia   . Asthma   . Fatigue   . History of heart attack     times 4  . CAD (coronary artery disease)   . CHF (congestive heart failure)   . Pacemaker   . Myocardial infarction     ROS:   All systems reviewed and negative except as noted in the HPI.   Past Surgical History  Procedure Date  . Coronary artery bypass graft 2002    times 4  . Cardiac catheterization 1996    stent placement  . Insert / replace / remove pacemaker      Family History  Problem Relation Age  of Onset  . Heart attack Father      History   Social History  . Marital Status: Divorced    Spouse Name: N/A    Number of Children: N/A  . Years of Education: N/A   Occupational History  . Not on file.   Social History Main Topics  . Smoking status: Former Smoker    Quit date: 04/23/1989  . Smokeless tobacco: Not on file  . Alcohol Use: No  . Drug Use: No  . Sexually Active: Not on file   Other Topics Concern  . Not on file   Social History Narrative  . No narrative on file     BP 130/86  Pulse 64  Ht 5\' 9"  (1.753 m)  Wt 275 lb (124.739 kg)  BMI 40.61 kg/m2  SpO2 95%  Physical Exam:  Well appearing middle-aged man,NAD HEENT: Unremarkable Neck:  No JVD, no thyromegally Lungs:  Clearwith no wheezes, rales, or rhonchi. HEART:  Regular rate rhythm, no murmurs, no rubs, no clicks Abd:  soft, positive bowel sounds, no organomegally, no rebound, no guarding Ext:  2 plus pulses, no edema, no cyanosis, no clubbing Skin:  No rashes no nodules Neuro:  CN II through XII intact, motor grossly intact  DEVICE  Normal device function.  See PaceArt for details.   Assess/Plan:

## 2011-12-17 NOTE — Assessment & Plan Note (Signed)
He denies anginal symptoms. His congestive heart failure is well controlled. He'll continue his vigorous physical activity. No change in medical therapy.

## 2011-12-20 ENCOUNTER — Encounter: Payer: Medicare Other | Admitting: Internal Medicine

## 2012-02-25 ENCOUNTER — Encounter: Payer: Self-pay | Admitting: Internal Medicine

## 2012-03-14 ENCOUNTER — Telehealth: Payer: Self-pay | Admitting: Internal Medicine

## 2012-03-14 NOTE — Telephone Encounter (Signed)
PT STATES THAT HE TRANSMIT EVERY NIGHT, WANTS TO KNOW IF HE NEEDS TO DO ANYTHING DIFFERENT FOR APPT [facility] 03/24/12 FOR PHONE CHECK.   COULD YOU CALL PT AND LET HIM KNOW IF THIS NEEDS DONE ANY DIFFERENT?

## 2012-03-14 NOTE — Telephone Encounter (Signed)
Spoke with patient and explained as long as his transmitter is on we will have it download on the scheduled date automatically.

## 2012-03-24 ENCOUNTER — Ambulatory Visit (INDEPENDENT_AMBULATORY_CARE_PROVIDER_SITE_OTHER): Payer: Medicare Other | Admitting: *Deleted

## 2012-03-24 DIAGNOSIS — I472 Ventricular tachycardia, unspecified: Secondary | ICD-10-CM

## 2012-03-24 DIAGNOSIS — Z95810 Presence of automatic (implantable) cardiac defibrillator: Secondary | ICD-10-CM

## 2012-03-24 LAB — REMOTE ICD DEVICE
AL THRESHOLD: 1.625 V
BAMS-0001: 170 {beats}/min
FVT: 0
HV IMPEDENCE: 71 Ohm
PACEART VT: 0
RV LEAD AMPLITUDE: 31.625 mv
RV LEAD THRESHOLD: 0.5 V
TOT-0001: 1
TZAT-0001ATACH: 2
TZAT-0002ATACH: NEGATIVE
TZAT-0004SLOWVT: 8
TZAT-0004SLOWVT: 8
TZAT-0011SLOWVT: 10 ms
TZAT-0011SLOWVT: 10 ms
TZAT-0012ATACH: 150 ms
TZAT-0012ATACH: 150 ms
TZAT-0012FASTVT: 170 ms
TZAT-0012SLOWVT: 170 ms
TZAT-0012SLOWVT: 170 ms
TZAT-0018ATACH: NEGATIVE
TZAT-0018FASTVT: NEGATIVE
TZAT-0019ATACH: 6 V
TZAT-0019ATACH: 6 V
TZAT-0019FASTVT: 8 V
TZAT-0020ATACH: 1.5 ms
TZAT-0020ATACH: 1.5 ms
TZAT-0020FASTVT: 1.5 ms
TZAT-0020SLOWVT: 1.5 ms
TZON-0003ATACH: 350 ms
TZON-0003SLOWVT: 300 ms
TZON-0003VSLOWVT: 450 ms
TZST-0001ATACH: 4
TZST-0001FASTVT: 2
TZST-0001FASTVT: 6
TZST-0001SLOWVT: 4
TZST-0001SLOWVT: 5
TZST-0002ATACH: NEGATIVE
TZST-0002FASTVT: NEGATIVE
TZST-0002FASTVT: NEGATIVE
TZST-0003SLOWVT: 35 J
TZST-0003SLOWVT: 35 J
TZST-0003SLOWVT: 35 J

## 2012-03-27 ENCOUNTER — Encounter: Payer: Self-pay | Admitting: *Deleted

## 2012-07-15 ENCOUNTER — Ambulatory Visit (INDEPENDENT_AMBULATORY_CARE_PROVIDER_SITE_OTHER): Payer: Medicare Other | Admitting: Internal Medicine

## 2012-07-15 ENCOUNTER — Encounter: Payer: Self-pay | Admitting: Internal Medicine

## 2012-07-15 VITALS — BP 142/83 | HR 73 | Wt 293.0 lb

## 2012-07-15 DIAGNOSIS — Z95810 Presence of automatic (implantable) cardiac defibrillator: Secondary | ICD-10-CM

## 2012-07-15 DIAGNOSIS — I472 Ventricular tachycardia, unspecified: Secondary | ICD-10-CM

## 2012-07-15 DIAGNOSIS — I2589 Other forms of chronic ischemic heart disease: Secondary | ICD-10-CM

## 2012-07-15 LAB — ICD DEVICE OBSERVATION
AL THRESHOLD: 1.75 V
BAMS-0001: 170 {beats}/min
CHARGE TIME: 8.398 s
FVT: 0
HV IMPEDENCE: 64 Ohm
PACEART VT: 0
RV LEAD AMPLITUDE: 28.875 mv
RV LEAD THRESHOLD: 0.5 V
TOT-0001: 1
TOT-0002: 1
TZAT-0001ATACH: 2
TZAT-0004SLOWVT: 8
TZAT-0004SLOWVT: 8
TZAT-0005SLOWVT: 88 pct
TZAT-0005SLOWVT: 91 pct
TZAT-0011SLOWVT: 10 ms
TZAT-0011SLOWVT: 10 ms
TZAT-0012ATACH: 150 ms
TZAT-0012ATACH: 150 ms
TZAT-0012FASTVT: 170 ms
TZAT-0012SLOWVT: 170 ms
TZAT-0012SLOWVT: 170 ms
TZAT-0018ATACH: NEGATIVE
TZAT-0018FASTVT: NEGATIVE
TZAT-0020ATACH: 1.5 ms
TZAT-0020ATACH: 1.5 ms
TZAT-0020FASTVT: 1.5 ms
TZAT-0020SLOWVT: 1.5 ms
TZON-0003ATACH: 350 ms
TZON-0003SLOWVT: 300 ms
TZON-0003VSLOWVT: 450 ms
TZON-0004SLOWVT: 16
TZON-0004VSLOWVT: 20
TZST-0001ATACH: 4
TZST-0001FASTVT: 2
TZST-0001FASTVT: 6
TZST-0001SLOWVT: 5
TZST-0001SLOWVT: 6
TZST-0002FASTVT: NEGATIVE
TZST-0002FASTVT: NEGATIVE
TZST-0003SLOWVT: 35 J
TZST-0003SLOWVT: 35 J
VENTRICULAR PACING ICD: 0.06 pct

## 2012-07-15 NOTE — Assessment & Plan Note (Signed)
The patient has had one episode of ventricular tachycardia which was successfully terminated with anti-tachycardic pacing. No change in medical therapy.

## 2012-07-15 NOTE — Progress Notes (Signed)
HPI Mr. Schreier returns today for followup. He is a very pleasant 57 year old man with an ischemic cardiomyopathy, chronic systolic heart failure, morbid obesity, status post ICD implantation. He has a history of ventricular tachycardia. The patient has lost almost 20 pounds in the last several months ago by going back exercising regularly. He denies chest pain or shortness of breath. No syncope. Allergies  Allergen Reactions  . Codeine Hives and Itching  . Vioxx (Rofecoxib) Palpitations    "Caused massive heart attack."     Current Outpatient Prescriptions  Medication Sig Dispense Refill  . albuterol (PROVENTIL HFA;VENTOLIN HFA) 108 (90 BASE) MCG/ACT inhaler Inhale 2 puffs into the lungs every 6 (six) hours as needed. For wheezing.      Marland Kitchen ALPRAZolam (XANAX) 1 MG tablet Take 1 mg by mouth 3 (three) times daily as needed. For anxiety.      Marland Kitchen aspirin 325 MG tablet Take 325 mg by mouth daily.      Marland Kitchen atorvastatin (LIPITOR) 80 MG tablet Take 80 mg by mouth daily.      . citalopram (CELEXA) 20 MG tablet Take 20 mg by mouth daily.        . cyclobenzaprine (FLEXERIL) 10 MG tablet Take 10 mg by mouth daily.        . fexofenadine (ALLEGRA) 180 MG tablet Take 180 mg by mouth daily.        . furosemide (LASIX) 40 MG tablet Take 40 mg by mouth every morning.      . lansoprazole (PREVACID) 30 MG capsule Take 30 mg by mouth daily.        Marland Kitchen lisinopril (PRINIVIL,ZESTRIL) 10 MG tablet Take 10 mg by mouth daily.      . meloxicam (MOBIC) 15 MG tablet Take 15 mg by mouth daily.      . metoprolol (TOPROL-XL) 50 MG 24 hr tablet Take 50 mg by mouth daily.        . Multiple Vitamin (MULTIVITAMIN WITH MINERALS) TABS Take 1 tablet by mouth daily.      . Omega-3 Fatty Acids (FISH OIL) 1200 MG CAPS Take 1,200 mg by mouth daily.      Marland Kitchen oxyCODONE-acetaminophen (PERCOCET) 10-325 MG per tablet Take 1 tablet by mouth every 6 (six) hours as needed for pain.      . potassium chloride SA (K-DUR,KLOR-CON) 20 MEQ tablet Take 20  mEq by mouth daily.        Marland Kitchen tiZANidine (ZANAFLEX) 4 MG tablet Take 4 mg by mouth 2 (two) times daily.       . traZODone (DESYREL) 50 MG tablet Take 50 mg by mouth at bedtime.       No current facility-administered medications for this visit.     Past Medical History  Diagnosis Date  . Hypertension   . Obesity   . Dyslipidemia   . Asthma   . Fatigue   . History of heart attack     times 4  . CAD (coronary artery disease)   . CHF (congestive heart failure)   . Pacemaker   . Myocardial infarction     ROS:   All systems reviewed and negative except as noted in the HPI.   Past Surgical History  Procedure Laterality Date  . Coronary artery bypass graft  2002    times 4  . Cardiac catheterization  1996    stent placement  . Insert / replace / remove pacemaker       Family History  Problem Relation Age  of Onset  . Heart attack Father      History   Social History  . Marital Status: Divorced    Spouse Name: N/A    Number of Children: N/A  . Years of Education: N/A   Occupational History  . Not on file.   Social History Main Topics  . Smoking status: Former Smoker    Quit date: 04/23/1989  . Smokeless tobacco: Not on file  . Alcohol Use: No  . Drug Use: No  . Sexually Active: Not on file   Other Topics Concern  . Not on file   Social History Narrative  . No narrative on file     BP 142/83  Pulse 73  Wt 293 lb (132.904 kg)  BMI 43.25 kg/m2  Physical Exam:  Well appearing 57 year old man, morbidly obese,NAD HEENT: Unremarkable Neck: 7 cm JVD, no thyromegally Back:  No CVA tenderness Lungs:  Clear with no wheezes, rales, or rhonchi. HEART:  Regular rate rhythm, no murmurs, no rubs, no clicks Abd:  soft, obese, positive bowel sounds, no organomegally, no rebound, no guarding Ext:  2 plus pulses, no edema, no cyanosis, no clubbing Skin:  No rashes no nodules Neuro:  CN II through XII intact, motor grossly intact   DEVICE  Normal device  function.  See PaceArt for details.   Assess/Plan:

## 2012-07-15 NOTE — Assessment & Plan Note (Signed)
His Medtronic ICD is working normally. We'll plan to recheck in several months. 

## 2012-07-15 NOTE — Assessment & Plan Note (Signed)
He denies anginal symptoms. He will continue his current medical therapy, and I've encouraged the patient to increase his physical activity.

## 2012-07-15 NOTE — Patient Instructions (Signed)
Your physician recommends that you schedule a follow-up appointment in: ONE YEAR WITH [facility]  DEVICE CHECK 10-20-12

## 2012-10-20 ENCOUNTER — Ambulatory Visit (INDEPENDENT_AMBULATORY_CARE_PROVIDER_SITE_OTHER): Payer: Medicare Other | Admitting: *Deleted

## 2012-10-20 ENCOUNTER — Encounter: Payer: Self-pay | Admitting: Internal Medicine

## 2012-10-20 DIAGNOSIS — Z95810 Presence of automatic (implantable) cardiac defibrillator: Secondary | ICD-10-CM

## 2012-10-20 DIAGNOSIS — I472 Ventricular tachycardia, unspecified: Secondary | ICD-10-CM

## 2012-10-31 LAB — REMOTE ICD DEVICE
ATRIAL PACING ICD: 12.72 pct
BAMS-0001: 170 {beats}/min
FVT: 0
PACEART VT: 0
RV LEAD AMPLITUDE: 20 mv
TOT-0001: 1
TZAT-0001ATACH: 1
TZAT-0001ATACH: 2
TZAT-0001ATACH: 3
TZAT-0001FASTVT: 1
TZAT-0004SLOWVT: 8
TZAT-0004SLOWVT: 8
TZAT-0005SLOWVT: 88 pct
TZAT-0005SLOWVT: 91 pct
TZAT-0011SLOWVT: 10 ms
TZAT-0011SLOWVT: 10 ms
TZAT-0012ATACH: 150 ms
TZAT-0012ATACH: 150 ms
TZAT-0012ATACH: 150 ms
TZAT-0012SLOWVT: 170 ms
TZAT-0012SLOWVT: 170 ms
TZAT-0013SLOWVT: 2
TZAT-0013SLOWVT: 2
TZAT-0018ATACH: NEGATIVE
TZAT-0018ATACH: NEGATIVE
TZAT-0018FASTVT: NEGATIVE
TZAT-0019ATACH: 6 V
TZAT-0020ATACH: 1.5 ms
TZAT-0020ATACH: 1.5 ms
TZAT-0020FASTVT: 1.5 ms
TZON-0003ATACH: 350 ms
TZON-0003SLOWVT: 300 ms
TZON-0003VSLOWVT: 450 ms
TZON-0004SLOWVT: 16
TZON-0004VSLOWVT: 20
TZST-0001ATACH: 4
TZST-0001FASTVT: 2
TZST-0001FASTVT: 4
TZST-0001FASTVT: 6
TZST-0001SLOWVT: 6
TZST-0002ATACH: NEGATIVE
TZST-0002FASTVT: NEGATIVE
TZST-0002FASTVT: NEGATIVE
TZST-0003SLOWVT: 35 J
TZST-0003SLOWVT: 35 J
TZST-0003SLOWVT: 35 J

## 2012-11-07 NOTE — Progress Notes (Signed)
ICD remote. All functions normal, no NSVT. Full details in PaceArt.  [facility] 01/26/13 & ROV w/ Dr. [name] 06/2013.

## 2012-11-19 ENCOUNTER — Encounter: Payer: Self-pay | Admitting: *Deleted

## 2012-11-28 ENCOUNTER — Ambulatory Visit: Payer: Medicare Other | Admitting: Cardiology

## 2013-01-06 ENCOUNTER — Ambulatory Visit (INDEPENDENT_AMBULATORY_CARE_PROVIDER_SITE_OTHER): Payer: Medicare Other | Admitting: Cardiology

## 2013-01-06 ENCOUNTER — Encounter: Payer: Self-pay | Admitting: Cardiology

## 2013-01-06 VITALS — BP 147/88 | HR 66 | Ht 69.0 in | Wt 288.4 lb

## 2013-01-06 DIAGNOSIS — I2510 Atherosclerotic heart disease of native coronary artery without angina pectoris: Secondary | ICD-10-CM

## 2013-01-06 DIAGNOSIS — I2589 Other forms of chronic ischemic heart disease: Secondary | ICD-10-CM

## 2013-01-06 LAB — HEPATIC FUNCTION PANEL
ALT: 20 U/L (ref 0–53)
AST: 18 U/L (ref 0–37)
Albumin: 4.1 g/dL (ref 3.5–5.2)
Bilirubin, Direct: 0.1 mg/dL (ref 0.0–0.3)
Total Bilirubin: 0.9 mg/dL (ref 0.3–1.2)
Total Protein: 7.6 g/dL (ref 6.0–8.3)

## 2013-01-06 LAB — CBC WITH DIFFERENTIAL/PLATELET
Basophils Relative: 0.2 % (ref 0.0–3.0)
Eosinophils Absolute: 0.1 10*3/uL (ref 0.0–0.7)
Hemoglobin: 13.9 g/dL (ref 13.0–17.0)
Lymphocytes Relative: 19.9 % (ref 12.0–46.0)
MCHC: 33.5 g/dL (ref 30.0–36.0)
MCV: 92.2 fl (ref 78.0–100.0)
Monocytes Absolute: 0.8 10*3/uL (ref 0.1–1.0)
Monocytes Relative: 6.3 % (ref 3.0–12.0)
Neutro Abs: 9.1 10*3/uL — ABNORMAL HIGH (ref 1.4–7.7)
Neutrophils Relative %: 72.8 % (ref 43.0–77.0)
Platelets: 195 10*3/uL (ref 150.0–400.0)
RBC: 4.49 Mil/uL (ref 4.22–5.81)
WBC: 12.5 10*3/uL — ABNORMAL HIGH (ref 4.5–10.5)

## 2013-01-06 LAB — BASIC METABOLIC PANEL
BUN: 8 mg/dL (ref 6–23)
Calcium: 9 mg/dL (ref 8.4–10.5)
Chloride: 103 mEq/L (ref 96–112)
GFR: 121.53 mL/min (ref >60.00)
Glucose, Bld: 109 mg/dL — ABNORMAL HIGH (ref 70–99)
Potassium: 3.9 mEq/L (ref 3.5–5.1)
Sodium: 138 mEq/L (ref 135–145)

## 2013-01-06 LAB — LIPID PANEL
Cholesterol: 163 mg/dL (ref 0–200)
HDL: 34.1 mg/dL — ABNORMAL LOW (ref >39.00)
Triglycerides: 174 mg/dL — ABNORMAL HIGH (ref 0.0–149.0)
VLDL: 34.8 mg/dL (ref 0.0–40.0)

## 2013-01-06 NOTE — Patient Instructions (Signed)
Your physician wants you to follow-up in: 1 YEAR WITH DR. HOCHREIN You will receive a reminder letter in the mail two months in advance. If you don't receive a letter, please call our office to schedule the follow-up appointment.  Your physician recommends that you return for lab work in: LIPID, LIVER, CBC, BMET  Your physician recommends that you continue on your current medications as directed. Please refer to the Current Medication list given to you today.

## 2013-01-06 NOTE — Progress Notes (Signed)
HPI The patient presents for followup. Since I last saw him he has been doing well from a cardiovascular standpoint. He has lost 5 pounds since I saw him he has been doing more activities. He is denying any shortness of breath, PND or orthopnea. He has had no chest pressure, neck or arm discomfort. He's not had any palpitations, presyncope or syncope. He has had no PND or orthopnea.  Allergies  Allergen Reactions  . Codeine Hives and Itching  . Vioxx [Rofecoxib] Palpitations    "Caused massive heart attack."    Current Outpatient Prescriptions  Medication Sig Dispense Refill  . albuterol (PROVENTIL HFA;VENTOLIN HFA) 108 (90 BASE) MCG/ACT inhaler Inhale 2 puffs into the lungs every 6 (six) hours as needed. For wheezing.      Marland Kitchen ALPRAZolam (XANAX) 1 MG tablet Take 1 mg by mouth 3 (three) times daily as needed. For anxiety.      Marland Kitchen aspirin 325 MG tablet Take 325 mg by mouth daily.      Marland Kitchen atorvastatin (LIPITOR) 80 MG tablet Take 80 mg by mouth daily.      . citalopram (CELEXA) 20 MG tablet Take 20 mg by mouth daily.        . cyclobenzaprine (FLEXERIL) 10 MG tablet Take 10 mg by mouth daily.        . fexofenadine (ALLEGRA) 180 MG tablet Take 180 mg by mouth daily.        . furosemide (LASIX) 40 MG tablet Take 40 mg by mouth every morning.      . lansoprazole (PREVACID) 30 MG capsule Take 30 mg by mouth daily.        Marland Kitchen lisinopril (PRINIVIL,ZESTRIL) 10 MG tablet Take 10 mg by mouth daily.      . meloxicam (MOBIC) 15 MG tablet Take 15 mg by mouth daily.      . metoprolol (TOPROL-XL) 50 MG 24 hr tablet Take 50 mg by mouth daily.        . Multiple Vitamin (MULTIVITAMIN WITH MINERALS) TABS Take 1 tablet by mouth daily.      . Omega-3 Fatty Acids (FISH OIL) 1200 MG CAPS Take 1,200 mg by mouth daily.      Marland Kitchen oxyCODONE-acetaminophen (PERCOCET) 10-325 MG per tablet Take 1 tablet by mouth every 6 (six) hours as needed for pain.      . potassium chloride SA (K-DUR,KLOR-CON) 20 MEQ tablet Take 20 mEq by  mouth daily.        Marland Kitchen tiZANidine (ZANAFLEX) 4 MG tablet Take 4 mg by mouth 2 (two) times daily.       . traZODone (DESYREL) 50 MG tablet Take 50 mg by mouth at bedtime.       No current facility-administered medications for this visit.    Past Medical History  Diagnosis Date  . Hypertension   . Obesity   . Dyslipidemia   . Asthma   . Fatigue   . History of heart attack     times 4  . CAD (coronary artery disease)   . CHF (congestive heart failure)   . Pacemaker   . Myocardial infarction     Past Surgical History  Procedure Laterality Date  . Coronary artery bypass graft  2002    times 4  . Cardiac catheterization  1996    stent placement  . Insert / replace / remove pacemaker      ROS:  As stated in the HPI and negative for all other systems.  PHYSICAL EXAM  BP 147/88  Pulse 66  Ht 5\' 9"  (1.753 m)  Wt 288 lb 6.4 oz (130.817 kg)  BMI 42.57 kg/m2 GENERAL:  Well appearing HEENT:  Pupils equal round and reactive, fundi not visualized, oral mucosa unremarkable NECK:  No jugular venous distention, waveform within normal limits, carotid upstroke brisk and symmetric, no bruits, no thyromegaly LYMPHATICS:  No cervical, inguinal adenopathy LUNGS:  Clear to auscultation bilaterally BACK:  No CVA tenderness CHEST:  Well healed sternotomy scar and right sided ICD pocket HEART:  PMI not displaced or sustained,S1 and S2 within normal limits, no S3, no S4, no clicks, no rubs, no murmurs ABD:  Flat, positive bowel sounds normal in frequency in pitch, no bruits, no rebound, no guarding, no midline pulsatile mass, no hepatomegaly, no splenomegaly, obese EXT:  2 plus pulses throughout, no edema, no cyanosis no clubbing SKIN:  No rashes no nodules NEURO:  Cranial nerves II through XII grossly intact, motor grossly intact throughout PSYCH:  Cognitively intact, oriented to person place and time  EKG:  Probable ectopic atrialrhythm, rate 66, lateral infarct, old anteroseptal infarct,  demand ventricular pacing, nonspecific lateral T-wave changes. 01/06/2013  ASSESSMENT AND PLAN  CARDIOMYOPATHY:  CAD:  The patient has no new sypmtoms.  No further cardiovascular testing is indicated.  We will continue with aggressive risk reduction and meds as listed.  DYSLIPIDEMIA:  I will check a lipid profile today.  HTN:  Blood pressure is very slightly elevated. He will remain on the meds as listed.  OBESITY:  Eye on his weight loss and I encourage more of the same.

## 2013-01-26 ENCOUNTER — Ambulatory Visit (INDEPENDENT_AMBULATORY_CARE_PROVIDER_SITE_OTHER): Payer: Medicare Other | Admitting: *Deleted

## 2013-01-26 ENCOUNTER — Encounter: Payer: Self-pay | Admitting: Internal Medicine

## 2013-01-26 DIAGNOSIS — I472 Ventricular tachycardia: Secondary | ICD-10-CM

## 2013-01-26 LAB — MDC_IDC_ENUM_SESS_TYPE_REMOTE
Lead Channel Pacing Threshold Amplitude: 0.375 V
Lead Channel Pacing Threshold Amplitude: 2.375 V
Lead Channel Setting Pacing Amplitude: 2 V
Lead Channel Setting Pacing Amplitude: 4.25 V
Lead Channel Setting Pacing Pulse Width: 0.4 ms
Lead Channel Setting Sensing Sensitivity: 0.3 mV
Zone Setting Detection Interval: 300 ms
Zone Setting Detection Interval: 350 ms
Zone Setting Detection Interval: 450 ms

## 2013-02-23 ENCOUNTER — Encounter: Payer: Self-pay | Admitting: *Deleted

## 2013-04-25 IMAGING — CT CT L SPINE W/O CM
3 of 9 series · 11 of 33 positions shown, 13 images · non-contrast
Comparison: None.

CLINICAL DATA: Low back pain.  Bilateral heel pain.  Medial foot
pain for 2 weeks.  History of surgery for herniated disc a decade
ago.

CT LUMBAR SPINE WITHOUT CONTRAST
TECHNIQUE: Multidetector CT imaging of the lumbar spine was
performed without intravenous contrast administration. Multiplanar
CT image reconstructions were also generated.

[Series 3: lumbar spine 2.0 b30s · axial · 0.36mm/px · z∈[-258,-122]mm · 3 of 119 slices shown, 4 images]
[im 34/119  soft-tissue]
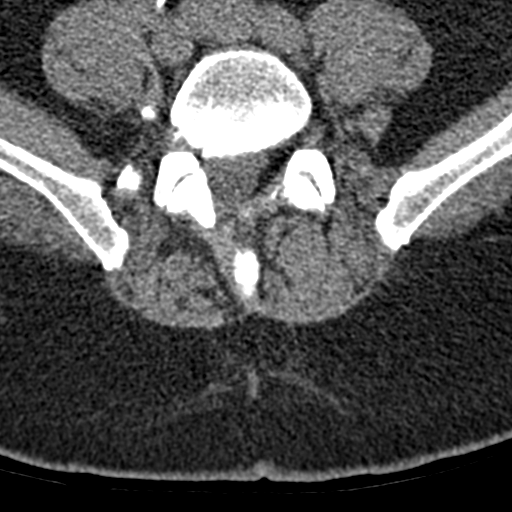
[im 34/119  bone]
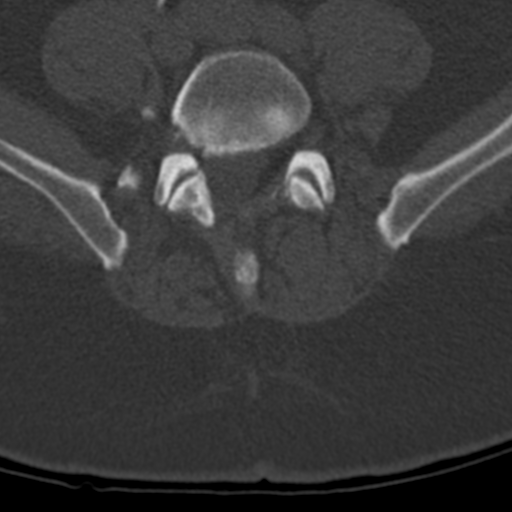
[im 68/119  bone]
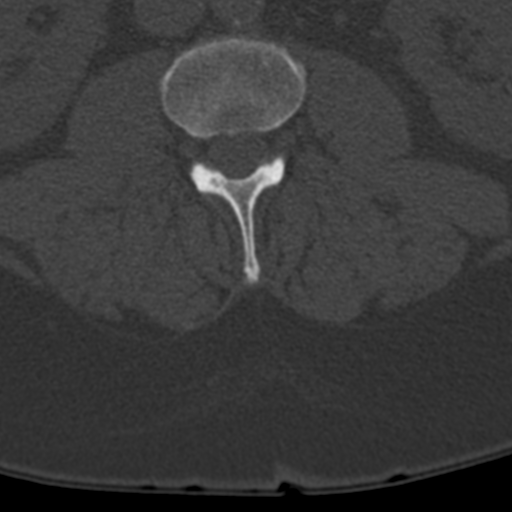
[im 102/119  bone]
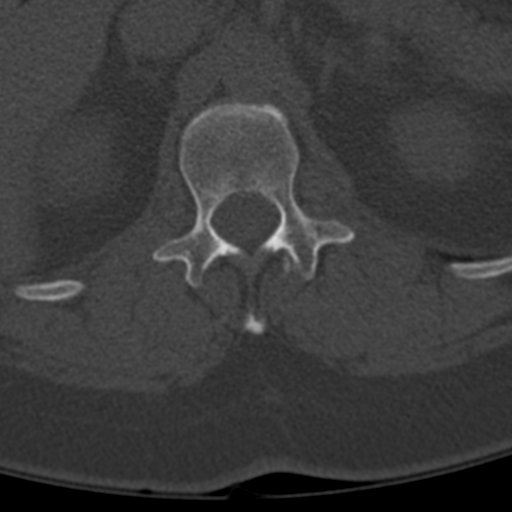

[Series 4: lumbar spine 2.0 spo · coronal · 0.27mm/px · 3 of 70 slices shown (1 of 2)]
[im 14/70  bone]
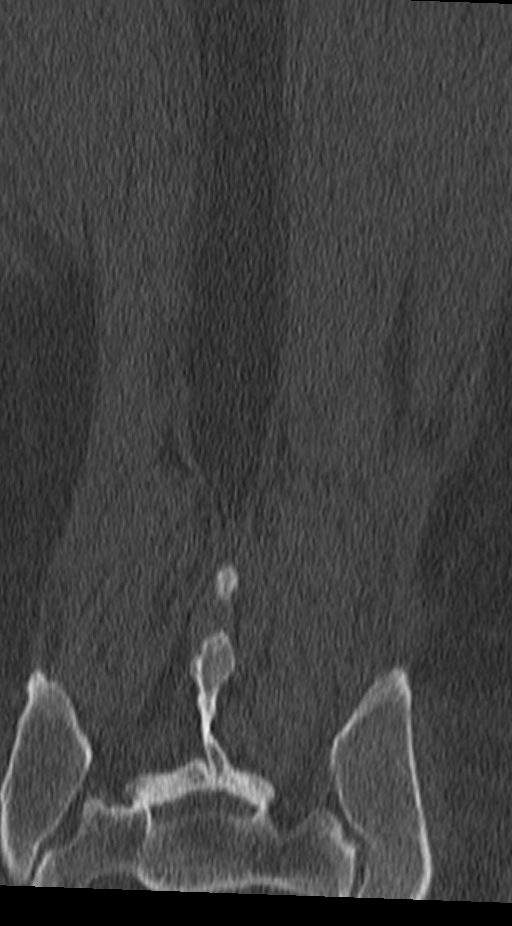
[im 28/70  bone]
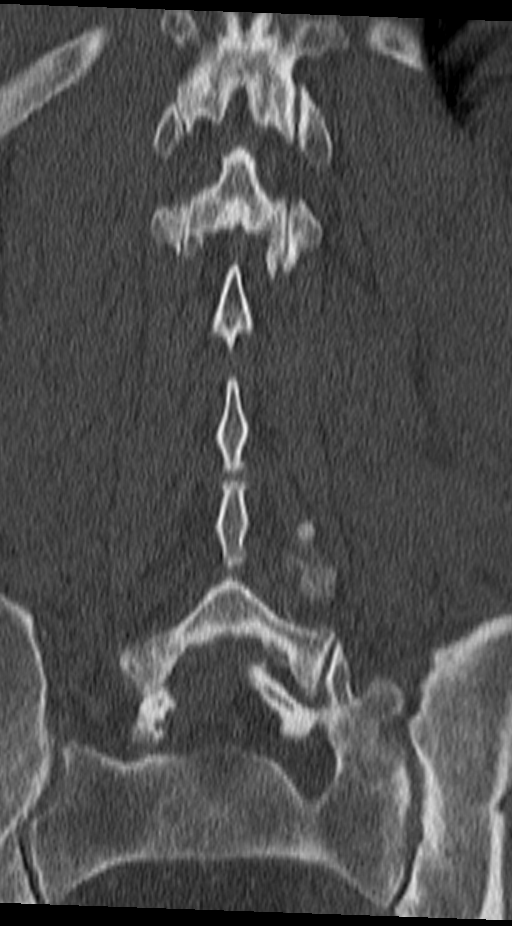
[im 42/70  bone]
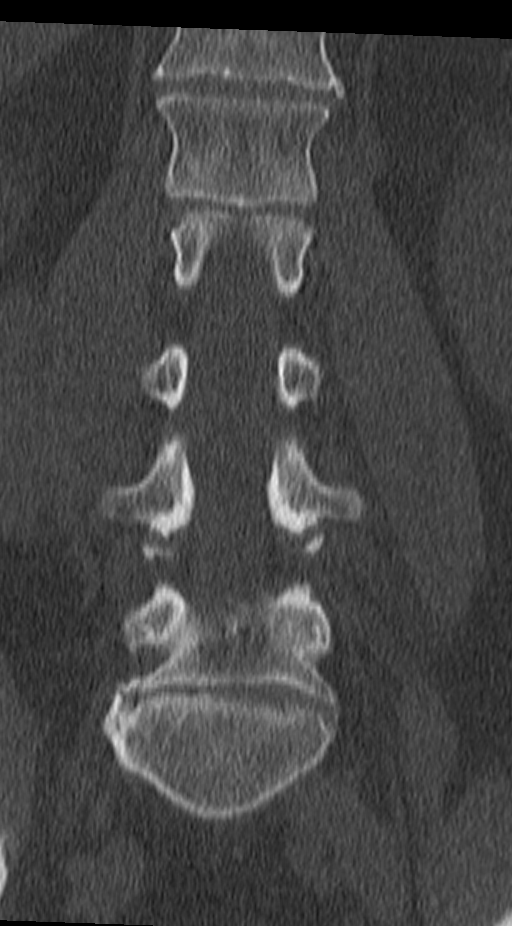

[Series 5: lumbar spine 2.0 spo · sagittal · 0.31mm/px · 5 of 74 slices shown, 6 images (2 of 2)]
[im 25/74  bone]
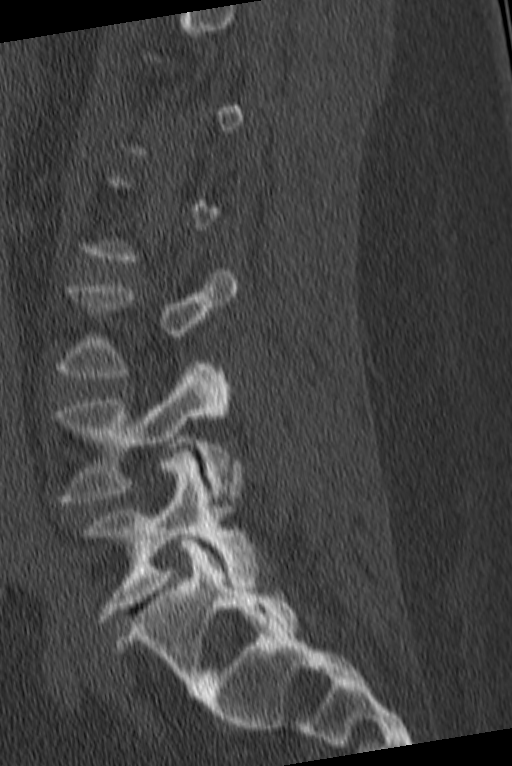
[im 31/74  bone]
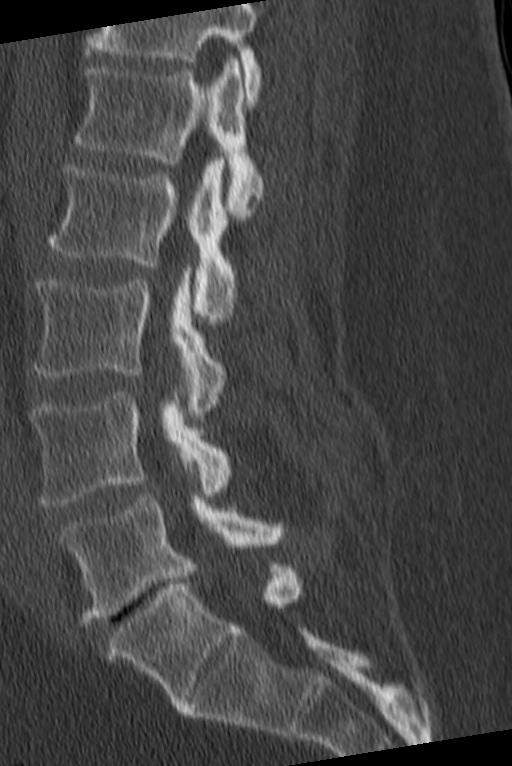
[im 37/74  soft-tissue]
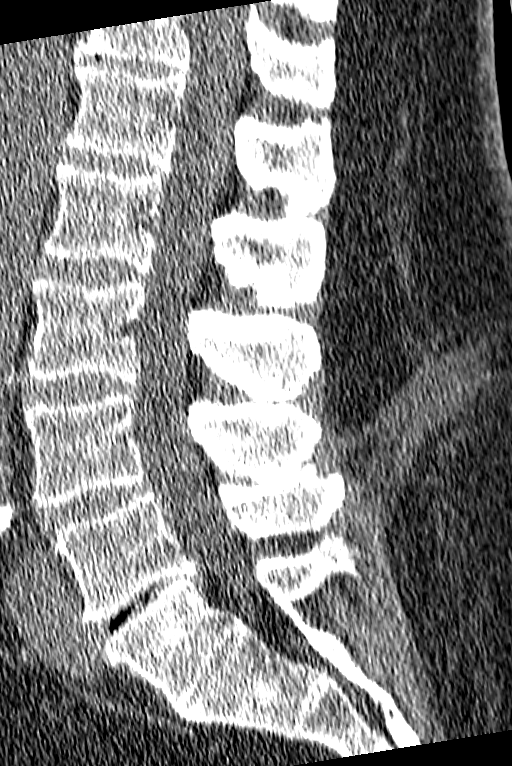
[im 37/74  bone]
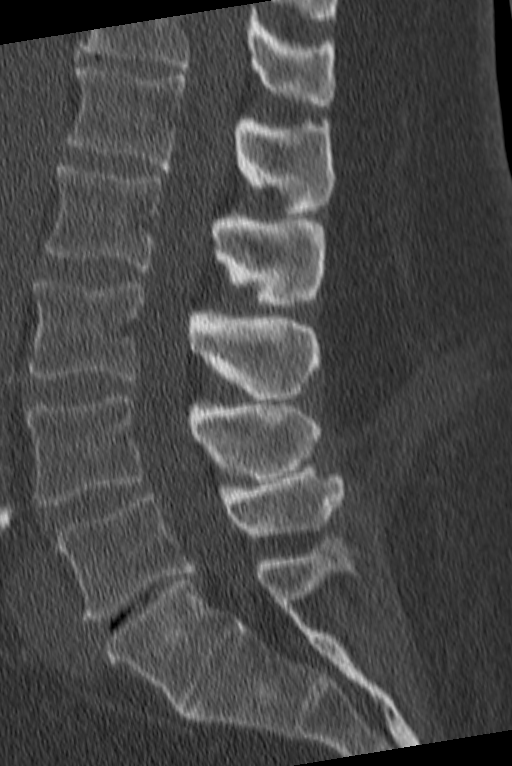
[im 43/74  bone]
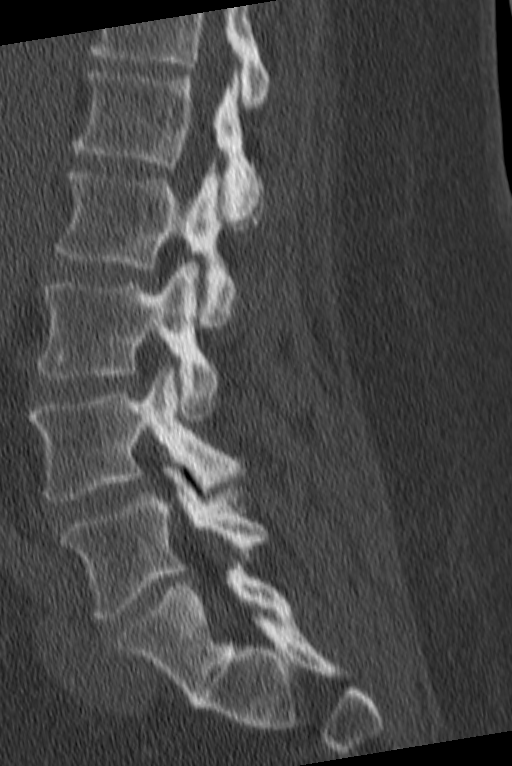
[im 49/74  bone]
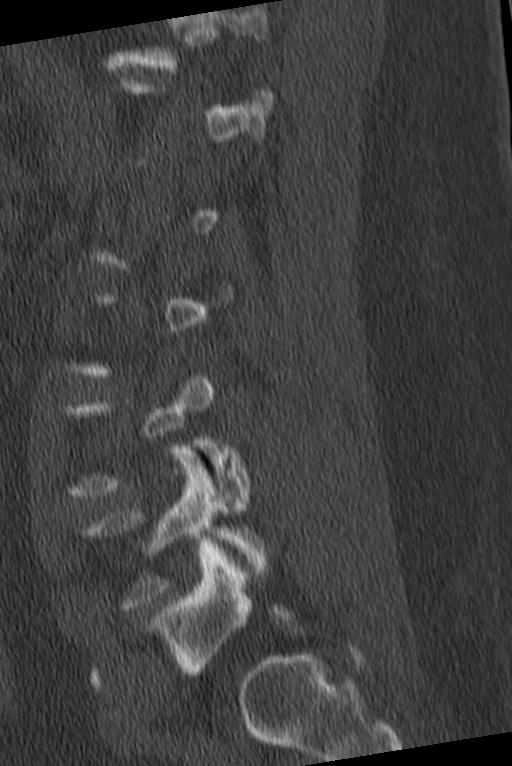

[11 of 33 positions shown; findings below may reference images not displayed]

FINDINGS: Five lumbar type vertebral bodies are present.  Vertebral
body height is preserved.  Grade 1 anterolisthesis of L4 on L5 is
present associated with degenerative facet arthrosis.  The
paraspinal soft tissues show partially visualized aortoiliac
atherosclerosis.  Bilateral right greater than left SI joint
degenerative disease with vacuum joint.

L1-L2:  Mild facet degeneration.  No stenosis.

L2-L3:  Minimal facet degeneration.  No stenosis.

L3-L4:  Right greater than left facet degeneration.  No stenosis.

L4-L5:  Severe bilateral facet degeneration with bilateral vacuum
facet joint.  Heterotopic bone surrounds the posterior aspect of
the facet joints bilaterally.  Grade 1 anterolisthesis is
degenerative.  Minimal uncoverage of the disc and mild loss of disc
height.  No bony central or lateral recess stenosis.  Mild left
greater than right foraminal stenosis associated with facet
hypertrophy.

L5-S1:  Postoperative changes of bilateral laminotomies at L5 and
right laminotomy at S1.  Desiccated and degenerated disc.  L5
posterior endplate osteophytes are present which are eccentric to
the right and produce a mild right lateral recess stenosis
potentially affecting the descending right S1 nerve.  Left foramen
appears patent.  Moderate right foraminal stenosis associated with
facet spurring.  Moderate right and mild left facet degeneration.
The left lateral recess appears patent.
IMPRESSION: 1.  Postoperative changes at L5-S1 with posterior decompression of
the thecal sac.  Degenerated disc with right eccentric endplate
osteophyte encroaches on the right lateral recess.  There is also
right foraminal stenosis.  Degenerative disease at this level
potentially affects the right L5 and right S1 nerve.
2.  L4-L5 bilateral facet degeneration with grade 1
anterolisthesis.  Mild left greater than right foraminal stenosis
associated with facet disease and anterolisthesis.

## 2013-04-29 ENCOUNTER — Encounter: Payer: Self-pay | Admitting: Internal Medicine

## 2013-04-29 ENCOUNTER — Other Ambulatory Visit: Payer: Self-pay | Admitting: Internal Medicine

## 2013-04-29 ENCOUNTER — Ambulatory Visit (INDEPENDENT_AMBULATORY_CARE_PROVIDER_SITE_OTHER): Payer: Medicare HMO | Admitting: *Deleted

## 2013-04-29 DIAGNOSIS — I4729 Other ventricular tachycardia: Secondary | ICD-10-CM

## 2013-04-29 DIAGNOSIS — I472 Ventricular tachycardia, unspecified: Secondary | ICD-10-CM

## 2013-05-17 LAB — MDC_IDC_ENUM_SESS_TYPE_REMOTE
Battery Voltage: 3.14 V
Brady Statistic AS VP Percent: 0.16 %
Brady Statistic AS VS Percent: 89.77 %
Date Time Interrogation Session: 20150304165218
HighPow Impedance: 228 Ohm
HighPow Impedance: 418 Ohm
HighPow Impedance: 71 Ohm
Lead Channel Impedance Value: 513 Ohm
Lead Channel Impedance Value: 551 Ohm
Lead Channel Pacing Threshold Amplitude: 2.5 V
Lead Channel Pacing Threshold Pulse Width: 0.4 ms
Lead Channel Setting Pacing Amplitude: 2 V
Lead Channel Setting Pacing Amplitude: 5 V
Lead Channel Setting Sensing Sensitivity: 0.3 mV
MDC IDC MSMT LEADCHNL RA PACING THRESHOLD PULSEWIDTH: 0.4 ms
MDC IDC MSMT LEADCHNL RA SENSING INTR AMPL: 2.375 mV
MDC IDC MSMT LEADCHNL RV PACING THRESHOLD AMPLITUDE: 0.375 V
MDC IDC MSMT LEADCHNL RV SENSING INTR AMPL: 31.625 mV
MDC IDC SET LEADCHNL RV PACING PULSEWIDTH: 0.4 ms
MDC IDC SET ZONE DETECTION INTERVAL: 300 ms
MDC IDC STAT BRADY AP VP PERCENT: 0.1 %
MDC IDC STAT BRADY AP VS PERCENT: 9.97 %
MDC IDC STAT BRADY RA PERCENT PACED: 10.07 %
MDC IDC STAT BRADY RV PERCENT PACED: 0.26 %
Zone Setting Detection Interval: 270 ms
Zone Setting Detection Interval: 350 ms
Zone Setting Detection Interval: 450 ms

## 2013-05-19 ENCOUNTER — Telehealth: Payer: Self-pay | Admitting: Internal Medicine

## 2013-05-19 NOTE — Telephone Encounter (Signed)
New message     Returning device clinic's call.  Pt want you to know that he did download on the 4th.

## 2013-05-19 NOTE — Telephone Encounter (Signed)
Spoke w/pt. Pt had episode on 04-20-13 with 2 rounds of ATP therapy. Pt got up at night and fell in bathroom (did not turn light on and tripped over bag of clothes). Pt thinks episode is from fall and him getting scared. Pt never loss consciousness. Pt aware of no driving x 6 mths due to episode and was instructed could start back driving around [phone] as long as no other episodes. Pt aware.

## 2013-05-27 ENCOUNTER — Encounter: Payer: Self-pay | Admitting: *Deleted

## 2013-07-27 ENCOUNTER — Encounter: Payer: Self-pay | Admitting: Internal Medicine

## 2013-07-27 ENCOUNTER — Ambulatory Visit (INDEPENDENT_AMBULATORY_CARE_PROVIDER_SITE_OTHER): Payer: Medicare HMO | Admitting: Internal Medicine

## 2013-07-27 VITALS — BP 158/90 | HR 80 | Ht 69.0 in | Wt 282.0 lb

## 2013-07-27 DIAGNOSIS — Z95810 Presence of automatic (implantable) cardiac defibrillator: Secondary | ICD-10-CM

## 2013-07-27 DIAGNOSIS — I472 Ventricular tachycardia, unspecified: Secondary | ICD-10-CM

## 2013-07-27 DIAGNOSIS — I4729 Other ventricular tachycardia: Secondary | ICD-10-CM

## 2013-07-27 DIAGNOSIS — I2589 Other forms of chronic ischemic heart disease: Secondary | ICD-10-CM

## 2013-07-27 LAB — MDC_IDC_ENUM_SESS_TYPE_INCLINIC
Battery Voltage: 3.14 V
Brady Statistic AP VP Percent: 0.03 %
Brady Statistic AP VS Percent: 18.28 %
Brady Statistic AS VS Percent: 81.63 %
Brady Statistic RA Percent Paced: 18.31 %
Date Time Interrogation Session: 20150601095147
HighPow Impedance: 228 Ohm
HighPow Impedance: 456 Ohm
HighPow Impedance: 62 Ohm
Lead Channel Impedance Value: 513 Ohm
Lead Channel Pacing Threshold Amplitude: 0.5 V
Lead Channel Pacing Threshold Pulse Width: 0.4 ms
Lead Channel Sensing Intrinsic Amplitude: 2 mV
Lead Channel Sensing Intrinsic Amplitude: 31.625 mV
Lead Channel Setting Pacing Amplitude: 2 V
MDC IDC MSMT LEADCHNL RA PACING THRESHOLD AMPLITUDE: 2.5 V
MDC IDC MSMT LEADCHNL RA SENSING INTR AMPL: 1.5 mV
MDC IDC MSMT LEADCHNL RV IMPEDANCE VALUE: 551 Ohm
MDC IDC MSMT LEADCHNL RV PACING THRESHOLD PULSEWIDTH: 0.4 ms
MDC IDC MSMT LEADCHNL RV SENSING INTR AMPL: 27.875 mV
MDC IDC SET LEADCHNL RA PACING AMPLITUDE: 2.5 V
MDC IDC SET LEADCHNL RV PACING PULSEWIDTH: 0.4 ms
MDC IDC SET LEADCHNL RV SENSING SENSITIVITY: 0.3 mV
MDC IDC SET ZONE DETECTION INTERVAL: 300 ms
MDC IDC SET ZONE DETECTION INTERVAL: 450 ms
MDC IDC STAT BRADY AS VP PERCENT: 0.05 %
MDC IDC STAT BRADY RV PERCENT PACED: 0.08 %
Zone Setting Detection Interval: 270 ms
Zone Setting Detection Interval: 350 ms

## 2013-07-27 NOTE — Assessment & Plan Note (Signed)
He denies anginal symptoms. I have encouraged the patient to increase his physical activity.

## 2013-07-27 NOTE — Patient Instructions (Signed)
Your physician recommends that you schedule a follow-up appointment in: 12 months with Dr [name] Bonita Quin will receive a reminder letter two months in advance reminding you to call and schedule your appointment. If you don't receive this letter, please contact our office.   Remote monitoring is used to monitor your Pacemaker of ICD from home. This monitoring reduces the number of office visits required to check your device to one time per year. It allows [place] to keep an eye on the functioning of your device to ensure it is working properly. You are scheduled for a device check from home on 10-29-13. You may send your transmission at any time that day. If you have a wireless device, the transmission will be sent automatically. After your physician reviews your transmission, you will receive a postcard with your next transmission date.

## 2013-07-27 NOTE — Assessment & Plan Note (Signed)
His medtronic ICD is working normally. Will recheck in several months. 

## 2013-07-27 NOTE — Assessment & Plan Note (Signed)
We discussed why he has been instructed not to drive. He may in the future need an anti-arrhythmic medication. Will hold off on this for now though I considered sotalol or amio.

## 2013-07-27 NOTE — Progress Notes (Signed)
HPI [name] returns today for followup. He is a very pleasant 58 year old man with an ischemic cardiomyopathy, chronic systolic heart failure, morbid obesity, status post ICD implantation. He has a history of ventricular tachycardia. The patient has had 2 episode of VT resulting in falling out. He did not know that his heart raced but thought that he had tripped. No ICD shock. He has been told not to drive. He denies chest pain or shortness of breath.  Allergies  Allergen Reactions  . Codeine Hives and Itching  . Vioxx [Rofecoxib] Palpitations    "Caused massive heart attack."     Current Outpatient Prescriptions  Medication Sig Dispense Refill  . albuterol (PROVENTIL HFA;VENTOLIN HFA) 108 (90 BASE) MCG/ACT inhaler Inhale 2 puffs into the lungs every 6 (six) hours as needed. For wheezing.      Marland Kitchen. ALPRAZolam (XANAX) 1 MG tablet Take 1 mg by mouth 3 (three) times daily as needed. For anxiety.      Marland Kitchen. aspirin 325 MG tablet Take 325 mg by mouth daily.      Marland Kitchen. atorvastatin (LIPITOR) 80 MG tablet Take 80 mg by mouth daily.      . citalopram (CELEXA) 20 MG tablet Take 20 mg by mouth daily.        . cyclobenzaprine (FLEXERIL) 10 MG tablet Take 10 mg by mouth daily.        . fexofenadine (ALLEGRA) 180 MG tablet Take 180 mg by mouth daily.        . furosemide (LASIX) 40 MG tablet Take 40 mg by mouth every morning.      . lansoprazole (PREVACID) 30 MG capsule Take 30 mg by mouth daily.        Marland Kitchen. lisinopril (PRINIVIL,ZESTRIL) 10 MG tablet Take 10 mg by mouth daily.      . meloxicam (MOBIC) 15 MG tablet Take 15 mg by mouth daily.      . metoprolol (TOPROL-XL) 50 MG 24 hr tablet Take 50 mg by mouth daily.        . Multiple Vitamin (MULTIVITAMIN WITH MINERALS) TABS Take 1 tablet by mouth daily.      . Omega-3 Fatty Acids (FISH OIL) 1200 MG CAPS Take 1,200 mg by mouth daily.      Marland Kitchen. oxyCODONE-acetaminophen (PERCOCET) 10-325 MG per tablet Take 1 tablet by mouth every 6 (six) hours as needed for pain.      .  potassium chloride SA (K-DUR,KLOR-CON) 20 MEQ tablet Take 20 mEq by mouth daily.        Marland Kitchen. tiZANidine (ZANAFLEX) 4 MG tablet Take 4 mg by mouth 2 (two) times daily.       . traZODone (DESYREL) 50 MG tablet Take 50 mg by mouth at bedtime.       No current facility-administered medications for this visit.     Past Medical History  Diagnosis Date  . Hypertension   . Obesity   . Dyslipidemia   . Asthma   . Fatigue   . History of heart attack     times 4  . CAD (coronary artery disease)   . CHF (congestive heart failure)   . Pacemaker   . Myocardial infarction     ROS:   All systems reviewed and negative except as noted in the HPI.   Past Surgical History  Procedure Laterality Date  . Coronary artery bypass graft  2002    times 4  . Cardiac catheterization  1996    stent placement  . Insert /  replace / remove pacemaker       Family History  Problem Relation Age of Onset  . Heart attack Father      History   Social History  . Marital Status: Divorced    Spouse Name: N/A    Number of Children: N/A  . Years of Education: N/A   Occupational History  . Not on file.   Social History Main Topics  . Smoking status: Former Smoker    Quit date: 04/23/1989  . Smokeless tobacco: Not on file  . Alcohol Use: No  . Drug Use: No  . Sexual Activity: Not on file   Other Topics Concern  . Not on file   Social History Narrative  . No narrative on file     There were no vitals taken for this visit.  Physical Exam:  Well appearing 58 year old man, obese,NAD HEENT: Unremarkable Neck: 7 cm JVD, no thyromegally Back:  No CVA tenderness Lungs:  Clear with no wheezes, rales, or rhonchi. HEART:  Regular rate rhythm, no murmurs, no rubs, no clicks Abd:  soft, obese, positive bowel sounds, no organomegally, no rebound, no guarding Ext:  2 plus pulses, no edema, no cyanosis, no clubbing Skin:  No rashes no nodules Neuro:  CN II through XII intact, motor grossly  intact   DEVICE  Normal device function.  See PaceArt for details.   Assess/Plan:

## 2013-08-24 ENCOUNTER — Emergency Department (HOSPITAL_COMMUNITY): Payer: Medicare HMO

## 2013-08-24 ENCOUNTER — Inpatient Hospital Stay (HOSPITAL_COMMUNITY)
Admission: EM | Admit: 2013-08-24 | Discharge: 2013-08-27 | DRG: 101 | Disposition: A | Discharge status: Home / Self Care | Payer: Medicare HMO | Attending: Internal Medicine | Admitting: Internal Medicine

## 2013-08-24 ENCOUNTER — Encounter (HOSPITAL_COMMUNITY): Payer: Self-pay | Admitting: Emergency Medicine

## 2013-08-24 DIAGNOSIS — F1910 Other psychoactive substance abuse, uncomplicated: Secondary | ICD-10-CM | POA: Diagnosis present

## 2013-08-24 DIAGNOSIS — I482 Chronic atrial fibrillation, unspecified: Secondary | ICD-10-CM | POA: Diagnosis present

## 2013-08-24 DIAGNOSIS — S0100XA Unspecified open wound of scalp, initial encounter: Secondary | ICD-10-CM | POA: Diagnosis present

## 2013-08-24 DIAGNOSIS — Z86718 Personal history of other venous thrombosis and embolism: Secondary | ICD-10-CM

## 2013-08-24 DIAGNOSIS — I2510 Atherosclerotic heart disease of native coronary artery without angina pectoris: Secondary | ICD-10-CM | POA: Diagnosis present

## 2013-08-24 DIAGNOSIS — I472 Ventricular tachycardia, unspecified: Secondary | ICD-10-CM

## 2013-08-24 DIAGNOSIS — I25119 Atherosclerotic heart disease of native coronary artery with unspecified angina pectoris: Secondary | ICD-10-CM

## 2013-08-24 DIAGNOSIS — Z6839 Body mass index (BMI) 39.0-39.9, adult: Secondary | ICD-10-CM

## 2013-08-24 DIAGNOSIS — R569 Unspecified convulsions: Principal | ICD-10-CM | POA: Diagnosis present

## 2013-08-24 DIAGNOSIS — I252 Old myocardial infarction: Secondary | ICD-10-CM

## 2013-08-24 DIAGNOSIS — Z87891 Personal history of nicotine dependence: Secondary | ICD-10-CM

## 2013-08-24 DIAGNOSIS — F1210 Cannabis abuse, uncomplicated: Secondary | ICD-10-CM | POA: Diagnosis present

## 2013-08-24 DIAGNOSIS — W19XXXA Unspecified fall, initial encounter: Secondary | ICD-10-CM

## 2013-08-24 DIAGNOSIS — E872 Acidosis, unspecified: Secondary | ICD-10-CM | POA: Diagnosis present

## 2013-08-24 DIAGNOSIS — M5430 Sciatica, unspecified side: Secondary | ICD-10-CM | POA: Diagnosis present

## 2013-08-24 DIAGNOSIS — S0101XA Laceration without foreign body of scalp, initial encounter: Secondary | ICD-10-CM

## 2013-08-24 DIAGNOSIS — I4891 Unspecified atrial fibrillation: Secondary | ICD-10-CM | POA: Diagnosis present

## 2013-08-24 DIAGNOSIS — I25810 Atherosclerosis of coronary artery bypass graft(s) without angina pectoris: Secondary | ICD-10-CM

## 2013-08-24 DIAGNOSIS — I10 Essential (primary) hypertension: Secondary | ICD-10-CM | POA: Diagnosis present

## 2013-08-24 DIAGNOSIS — Y9301 Activity, walking, marching and hiking: Secondary | ICD-10-CM

## 2013-08-24 DIAGNOSIS — I5022 Chronic systolic (congestive) heart failure: Secondary | ICD-10-CM | POA: Diagnosis present

## 2013-08-24 DIAGNOSIS — J45909 Unspecified asthma, uncomplicated: Secondary | ICD-10-CM | POA: Diagnosis present

## 2013-08-24 DIAGNOSIS — Z7982 Long term (current) use of aspirin: Secondary | ICD-10-CM

## 2013-08-24 DIAGNOSIS — W1789XA Other fall from one level to another, initial encounter: Secondary | ICD-10-CM | POA: Diagnosis present

## 2013-08-24 DIAGNOSIS — M47817 Spondylosis without myelopathy or radiculopathy, lumbosacral region: Secondary | ICD-10-CM | POA: Diagnosis present

## 2013-08-24 DIAGNOSIS — I2589 Other forms of chronic ischemic heart disease: Secondary | ICD-10-CM | POA: Diagnosis present

## 2013-08-24 DIAGNOSIS — Z79899 Other long term (current) drug therapy: Secondary | ICD-10-CM

## 2013-08-24 DIAGNOSIS — E785 Hyperlipidemia, unspecified: Secondary | ICD-10-CM | POA: Diagnosis present

## 2013-08-24 DIAGNOSIS — Z95810 Presence of automatic (implantable) cardiac defibrillator: Secondary | ICD-10-CM | POA: Diagnosis present

## 2013-08-24 DIAGNOSIS — I255 Ischemic cardiomyopathy: Secondary | ICD-10-CM | POA: Diagnosis present

## 2013-08-24 DIAGNOSIS — D72829 Elevated white blood cell count, unspecified: Secondary | ICD-10-CM | POA: Diagnosis present

## 2013-08-24 DIAGNOSIS — Z951 Presence of aortocoronary bypass graft: Secondary | ICD-10-CM

## 2013-08-24 DIAGNOSIS — I509 Heart failure, unspecified: Secondary | ICD-10-CM | POA: Diagnosis present

## 2013-08-24 DIAGNOSIS — Y92009 Unspecified place in unspecified non-institutional (private) residence as the place of occurrence of the external cause: Secondary | ICD-10-CM

## 2013-08-24 LAB — I-STAT CHEM 8, ED
BUN: 20 mg/dL (ref 6–23)
CALCIUM ION: 1.05 mmol/L — AB (ref 1.12–1.23)
CHLORIDE: 105 meq/L (ref 96–112)
Creatinine, Ser: 1.1 mg/dL (ref 0.50–1.35)
GLUCOSE: 200 mg/dL — AB (ref 70–99)
HCT: 42 % (ref 39.0–52.0)
Hemoglobin: 14.3 g/dL (ref 13.0–17.0)
Potassium: 3.8 mEq/L (ref 3.7–5.3)
Sodium: 137 mEq/L (ref 137–147)
TCO2: 20 mmol/L (ref 0–100)

## 2013-08-24 LAB — BASIC METABOLIC PANEL
BUN: 21 mg/dL (ref 6–23)
CHLORIDE: 95 meq/L — AB (ref 96–112)
CO2: 18 mEq/L — ABNORMAL LOW (ref 19–32)
Calcium: 8.6 mg/dL (ref 8.4–10.5)
Creatinine, Ser: 1.04 mg/dL (ref 0.50–1.35)
GFR calc non Af Amer: 78 mL/min — ABNORMAL LOW (ref >90)
Glucose, Bld: 201 mg/dL — ABNORMAL HIGH (ref 70–99)
POTASSIUM: 4.1 meq/L (ref 3.7–5.3)
SODIUM: 137 meq/L (ref 137–147)

## 2013-08-24 LAB — I-STAT CG4 LACTIC ACID, ED: Lactic Acid, Venous: 6.31 mmol/L — ABNORMAL HIGH (ref 0.5–2.2)

## 2013-08-24 LAB — PROTIME-INR
INR: 1.27 (ref 0.00–1.49)
PROTHROMBIN TIME: 15.9 s — AB (ref 11.6–15.2)

## 2013-08-24 LAB — ETHANOL

## 2013-08-24 LAB — I-STAT TROPONIN, ED: TROPONIN I, POC: 0.02 ng/mL (ref 0.00–0.08)

## 2013-08-24 LAB — APTT: aPTT: 24 seconds (ref 24–37)

## 2013-08-24 MED ORDER — SODIUM CHLORIDE 0.9 % IV BOLUS (SEPSIS): 1000.0000 mL | Freq: Once | INTRAVENOUS | Status: DC

## 2013-08-24 MED ORDER — METOPROLOL TARTRATE 1 MG/ML IV SOLN
5.0000 mg | Freq: Once | INTRAVENOUS | Status: DC
  Filled 2013-08-24: qty 5

## 2013-08-24 MED ORDER — METOPROLOL TARTRATE 1 MG/ML IV SOLN
5.0000 mg | Freq: Once | INTRAVENOUS | Status: AC
  Administered 2013-08-24: 5 mg via INTRAVENOUS
  Filled 2013-08-24: qty 5

## 2013-08-24 NOTE — ED Notes (Signed)
Pt returned from CT °

## 2013-08-24 NOTE — ED Notes (Signed)
MD at bedside. 

## 2013-08-24 NOTE — ED Notes (Signed)
Per EMS: pt coming from home with c/o fall and seizure. Pt's family stated the pt "tensed up like he was having a seizure" fell forward off a porch approximately 3-4 feet. Upon EMS arrival pt was A&Ox4 talking to medics, upon arrival to ED pt had a seizure, pt was given 2 mg of ativan. Pt is now post-ictle, presents with oral trauma, and diaphoretic.

## 2013-08-24 NOTE — ED Provider Notes (Signed)
CSN: [id number]     Arrival date & time 08/24/13  2148 History   First MD Initiated Contact with Patient 08/24/13 2217     Chief Complaint  Patient presents with  . Fall  . Seizures  . Altered Mental Status     (Consider location/radiation/quality/duration/timing/severity/associated sxs/prior Treatment) Patient is a 58 y.o. male presenting with seizures. The history is provided by the EMS personnel and a relative. The history is limited by the condition of the patient.  Seizures Initial focality:  Unable to specify Episode characteristics: abnormal movements, eye deviation, generalized shaking, tongue biting and unresponsiveness   Postictal symptoms: confusion and somnolence   Severity:  Severe Timing: twice. Progression:  Partially resolved Recent head injury:  No recent head injuries   58 yo male pw seizure.  Patient with some emesis and diarrhea past few days. Today while walking outside had seizure. Full body tensing up. Some shaking. Larey Seat forward off porch. About 3 feet high.  EMS called to scene. Upon arrival patient alert and oriented. Talking. CBG wnl. En route patient with another seizure episode. Full body tightening and shaking again. Resolved with ativan 2mg .  Per sister, patient without h/o seizure. Does have extensive cardiac history.    Past Medical History  Diagnosis Date  . Hypertension   . Obesity   . Dyslipidemia   . Asthma   . Fatigue   . History of heart attack     times 4  . CAD (coronary artery disease)   . CHF (congestive heart failure)   . Pacemaker   . Myocardial infarction    Past Surgical History  Procedure Laterality Date  . Coronary artery bypass graft  2002    times 4  . Cardiac catheterization  1996    stent placement  . Insert / replace / remove pacemaker     Family History  Problem Relation Age of Onset  . Heart attack Father    History  Substance Use Topics  . Smoking status: Former Smoker    Quit date: 04/23/1989  .  Smokeless tobacco: Not on file  . Alcohol Use: No    Review of Systems  Unable to perform ROS: Mental status change  Constitutional: Negative for fever and chills.  Respiratory: Negative for shortness of breath.   Cardiovascular: Negative for chest pain.  Gastrointestinal: Positive for nausea, vomiting (resolved. been going on past few days) and diarrhea (past few days). Negative for abdominal pain.  Neurological: Positive for seizures.      Allergies  Codeine and Vioxx  Home Medications   Prior to Admission medications   Medication Sig Start Date End Date Taking? Authorizing Provider  albuterol (PROVENTIL HFA;VENTOLIN HFA) 108 (90 BASE) MCG/ACT inhaler Inhale 2 puffs into the lungs every 6 (six) hours as needed. For wheezing.    Historical Provider, MD  ALPRAZolam Prudy Feeler) 1 MG tablet Take 1 mg by mouth 3 (three) times daily as needed. For anxiety.    Historical Provider, MD  aspirin 325 MG tablet Take 325 mg by mouth daily.    Historical Provider, MD  atorvastatin (LIPITOR) 80 MG tablet Take 80 mg by mouth daily.    Historical Provider, MD  citalopram (CELEXA) 20 MG tablet Take 20 mg by mouth daily.      Historical Provider, MD  cyclobenzaprine (FLEXERIL) 10 MG tablet Take 10 mg by mouth daily.      Historical Provider, MD  fexofenadine (ALLEGRA) 180 MG tablet Take 180 mg by mouth daily.  Historical Provider, MD  furosemide (LASIX) 40 MG tablet Take 40 mg by mouth every morning.    Historical Provider, MD  lansoprazole (PREVACID) 30 MG capsule Take 30 mg by mouth daily.      Historical Provider, MD  lisinopril (PRINIVIL,ZESTRIL) 10 MG tablet Take 10 mg by mouth daily.    Historical Provider, MD  meloxicam (MOBIC) 15 MG tablet Take 15 mg by mouth daily.    Historical Provider, MD  metoprolol (TOPROL-XL) 50 MG 24 hr tablet Take 50 mg by mouth daily.      Historical Provider, MD  Multiple Vitamin (MULTIVITAMIN WITH MINERALS) TABS Take 1 tablet by mouth daily.    Historical  Provider, MD  Omega-3 Fatty Acids (FISH OIL) 1200 MG CAPS Take 1,200 mg by mouth daily.    Historical Provider, MD  oxyCODONE-acetaminophen (PERCOCET) 10-325 MG per tablet Take 1 tablet by mouth every 6 (six) hours as needed for pain.    Historical Provider, MD  potassium chloride SA (K-DUR,KLOR-CON) 20 MEQ tablet Take 20 mEq by mouth daily.      Historical Provider, MD  tiZANidine (ZANAFLEX) 4 MG tablet Take 4 mg by mouth 2 (two) times daily.  09/29/11   Historical Provider, MD  traZODone (DESYREL) 50 MG tablet Take 50 mg by mouth at bedtime.    Historical Provider, MD   BP 134/91  Pulse 146  Resp 20  SpO2 99% Physical Exam  Nursing note and vitals reviewed. Constitutional:  c-collar and backboard. Responds to questions, but disoriented.  HENT:  Head: Normocephalic.  Blood to posterior scalp. No active bleeding. Unable to visualize source.  Eyes: Conjunctivae are normal. Pupils are equal, round, and reactive to light. Right eye exhibits no discharge. Left eye exhibits no discharge.  Neck: No tracheal deviation present.  Cardiovascular: Normal heart sounds and intact distal pulses.   Irregularly irregular. Tachycardic.  Pulmonary/Chest: Effort normal and breath sounds normal. No stridor. No respiratory distress. He has no wheezes. He has no rales.  Abdominal: Soft. He exhibits no distension. There is no tenderness. There is no guarding.  Musculoskeletal: He exhibits no tenderness.  Neurological: He is alert.  Follows commands all extremities. No facial droop  Skin: Skin is warm.  later scalp cleaned. Has 2 small lacs to posterior scalp. Hemostatic. No deep structures.   ED Course  LACERATION REPAIR Date/Time: 08/25/2013 12:58 AM Performed by: Derwood KaplanNANAVATI, ANKIT Authorized by: Stevie KernMCLENNAN, RYAN Consent: Verbal consent obtained. Body area: head/neck Location details: scalp Laceration length: 1.5 cm Foreign bodies: no foreign bodies Tendon involvement: none Nerve involvement:  none Anesthesia: local infiltration Local anesthetic: lidocaine 2% with epinephrine Anesthetic total (ml): 1. Preparation: Patient was prepped and draped in the usual sterile fashion. Irrigation solution: saline Irrigation method: syringe Amount of cleaning: extensive Debridement: none Degree of undermining: none Skin closure: staples Number of sutures: 2 Approximation difficulty: simple Dressing: antibiotic ointment Patient tolerance: Patient tolerated the procedure well with no immediate complications.  LACERATION REPAIR Date/Time: 08/25/2013 12:59 AM Performed by: Derwood KaplanNANAVATI, ANKIT Authorized by: Stevie KernMCLENNAN, RYAN Consent: Verbal consent obtained. Body area: head/neck Location details: scalp Laceration length: 1 cm Anesthesia: local infiltration Local anesthetic: lidocaine 2% with epinephrine Anesthetic total: 1 ml Irrigation solution: saline Irrigation method: syringe Amount of cleaning: extensive Debridement: none Degree of undermining: none Skin closure: staples Number of sutures: 2 Approximation difficulty: simple Dressing: antibiotic ointment Patient tolerance: Patient tolerated the procedure well with no immediate complications.   (including critical care time) Labs Review Labs Reviewed  BASIC METABOLIC  PANEL  PRO B NATRIURETIC PEPTIDE  TROPONIN I  PROTIME-INR  APTT  URINALYSIS, ROUTINE W REFLEX MICROSCOPIC  CBC WITH DIFFERENTIAL  URINE RAPID DRUG SCREEN (HOSP PERFORMED)  ETHANOL  I-STAT CG4 LACTIC ACID, ED  I-STAT CHEM 8, ED  I-STAT TROPOININ, ED    Imaging Review No results found.   EKG Interpretation None      MDM   Final diagnoses:  None    Seizure. Not had previously.  Tachycardic upon arrival. A. Fib rvr. Given 5mg  metoprolol. Some improvement from 140's to 120's. Held second dose 2/2 borderline BP.  CT head negative. Neuro consulted. recs for EEG (no MR 2/2 ICD).  ICD interrogated. Did not fire.  Lac repairs as above.  Lactic acid  elevated, but expected in setting of seizure.  Admit to medicine step down unit.   Labs and imaging reviewed by myself and considered in medical decision making if ordered. Imaging interpreted by radiology.   Discussed case with Dr. Rhunette CroftNanavati who is in agreement with assessment and plan.       Stevie Kernyan McLennan, MD 08/25/13 [phone]

## 2013-08-24 NOTE — ED Notes (Signed)
i-stat CG4 result given to Dr. Rhunette CroftNanavati

## 2013-08-25 ENCOUNTER — Inpatient Hospital Stay (HOSPITAL_COMMUNITY): Payer: Medicare HMO

## 2013-08-25 ENCOUNTER — Encounter (HOSPITAL_COMMUNITY): Payer: Self-pay | Admitting: *Deleted

## 2013-08-25 DIAGNOSIS — I25810 Atherosclerosis of coronary artery bypass graft(s) without angina pectoris: Secondary | ICD-10-CM

## 2013-08-25 DIAGNOSIS — I482 Chronic atrial fibrillation, unspecified: Secondary | ICD-10-CM | POA: Diagnosis present

## 2013-08-25 DIAGNOSIS — Z95810 Presence of automatic (implantable) cardiac defibrillator: Secondary | ICD-10-CM

## 2013-08-25 DIAGNOSIS — I10 Essential (primary) hypertension: Secondary | ICD-10-CM

## 2013-08-25 DIAGNOSIS — S0100XA Unspecified open wound of scalp, initial encounter: Secondary | ICD-10-CM

## 2013-08-25 DIAGNOSIS — I4891 Unspecified atrial fibrillation: Secondary | ICD-10-CM | POA: Diagnosis present

## 2013-08-25 DIAGNOSIS — W19XXXA Unspecified fall, initial encounter: Secondary | ICD-10-CM

## 2013-08-25 DIAGNOSIS — F1910 Other psychoactive substance abuse, uncomplicated: Secondary | ICD-10-CM | POA: Diagnosis present

## 2013-08-25 DIAGNOSIS — R569 Unspecified convulsions: Secondary | ICD-10-CM | POA: Diagnosis present

## 2013-08-25 LAB — CBC WITH DIFFERENTIAL/PLATELET
BASOS PCT: 0 % (ref 0–1)
Basophils Absolute: 0 10*3/uL (ref 0.0–0.1)
Basophils Absolute: 0 10*3/uL (ref 0.0–0.1)
Basophils Relative: 0 % (ref 0–1)
EOS PCT: 1 % (ref 0–5)
EOS PCT: 0 % (ref 0–5)
Eosinophils Absolute: 0.2 10*3/uL (ref 0.0–0.7)
Eosinophils Absolute: 0 10*3/uL (ref 0.0–0.7)
HCT: 38.3 % — ABNORMAL LOW (ref 39.0–52.0)
HCT: 39 % (ref 39.0–52.0)
Hemoglobin: 12.5 g/dL — ABNORMAL LOW (ref 13.0–17.0)
Hemoglobin: 12.9 g/dL — ABNORMAL LOW (ref 13.0–17.0)
LYMPHS ABS: 2.5 10*3/uL (ref 0.7–4.0)
Lymphocytes Relative: 15 % (ref 12–46)
Lymphocytes Relative: 19 % (ref 12–46)
Lymphs Abs: 3.7 10*3/uL (ref 0.7–4.0)
MCH: 30.2 pg (ref 26.0–34.0)
MCH: 30.6 pg (ref 26.0–34.0)
MCHC: 32.6 g/dL (ref 30.0–36.0)
MCHC: 33.1 g/dL (ref 30.0–36.0)
MCV: 92.5 fL (ref 78.0–100.0)
MCV: 92.6 fL (ref 78.0–100.0)
MONO ABS: 1.5 10*3/uL — AB (ref 0.1–1.0)
Monocytes Absolute: 1.2 10*3/uL — ABNORMAL HIGH (ref 0.1–1.0)
Monocytes Relative: 7 % (ref 3–12)
Monocytes Relative: 8 % (ref 3–12)
Neutro Abs: 13 10*3/uL — ABNORMAL HIGH (ref 1.7–7.7)
Neutro Abs: 14.1 10*3/uL — ABNORMAL HIGH (ref 1.7–7.7)
Neutrophils Relative %: 77 % (ref 43–77)
Neutrophils Relative %: 73 % (ref 43–77)
Platelets: 261 10*3/uL (ref 150–400)
Platelets: 274 10*3/uL (ref 150–400)
RBC: 4.14 MIL/uL — AB (ref 4.22–5.81)
RBC: 4.21 MIL/uL — ABNORMAL LOW (ref 4.22–5.81)
RDW: 14 % (ref 11.5–15.5)
RDW: 14.1 % (ref 11.5–15.5)
WBC: 16.9 10*3/uL — ABNORMAL HIGH (ref 4.0–10.5)
WBC: 19.3 10*3/uL — ABNORMAL HIGH (ref 4.0–10.5)

## 2013-08-25 LAB — PROTIME-INR
INR: 1.24 (ref 0.00–1.49)
Prothrombin Time: 15.6 seconds — ABNORMAL HIGH (ref 11.6–15.2)

## 2013-08-25 LAB — GLUCOSE, CAPILLARY
GLUCOSE-CAPILLARY: 192 mg/dL — AB (ref 70–99)
GLUCOSE-CAPILLARY: 130 mg/dL — AB (ref 70–99)

## 2013-08-25 LAB — RAPID URINE DRUG SCREEN, HOSP PERFORMED
AMPHETAMINES: NOT DETECTED
Barbiturates: NOT DETECTED
Benzodiazepines: POSITIVE — AB
Cocaine: NOT DETECTED
Opiates: NOT DETECTED
TETRAHYDROCANNABINOL: POSITIVE — AB

## 2013-08-25 LAB — TSH
TSH: 0.86 u[IU]/mL (ref 0.350–4.500)
TSH: 1.23 u[IU]/mL (ref 0.350–4.500)

## 2013-08-25 LAB — URINALYSIS, ROUTINE W REFLEX MICROSCOPIC
Bilirubin Urine: NEGATIVE
Glucose, UA: NEGATIVE mg/dL
HGB URINE DIPSTICK: NEGATIVE
Ketones, ur: NEGATIVE mg/dL
Leukocytes, UA: NEGATIVE
Nitrite: NEGATIVE
PROTEIN: 30 mg/dL — AB
SPECIFIC GRAVITY, URINE: 1.024 (ref 1.005–1.030)
Urobilinogen, UA: 0.2 mg/dL (ref 0.0–1.0)
pH: 5 (ref 5.0–8.0)

## 2013-08-25 LAB — URINE MICROSCOPIC-ADD ON

## 2013-08-25 LAB — MAGNESIUM: Magnesium: 1.8 mg/dL (ref 1.5–2.5)

## 2013-08-25 LAB — PRO B NATRIURETIC PEPTIDE: Pro B Natriuretic peptide (BNP): 2056 pg/mL — ABNORMAL HIGH (ref 0–125)

## 2013-08-25 LAB — TROPONIN I

## 2013-08-25 LAB — MRSA PCR SCREENING: MRSA by PCR: POSITIVE — AB

## 2013-08-25 LAB — LACTIC ACID, PLASMA: Lactic Acid, Venous: 1.4 mmol/L (ref 0.5–2.2)

## 2013-08-25 MED ORDER — CYCLOBENZAPRINE HCL 10 MG PO TABS
10.0000 mg | ORAL_TABLET | Freq: Three times a day (TID) | ORAL | Status: DC | PRN
  Administered 2013-08-26: 10 mg via ORAL
  Filled 2013-08-25: qty 1

## 2013-08-25 MED ORDER — PANTOPRAZOLE SODIUM 40 MG PO TBEC
40.0000 mg | DELAYED_RELEASE_TABLET | Freq: Every day | ORAL | Status: DC
  Administered 2013-08-25 – 2013-08-27 (×3): 40 mg via ORAL
  Filled 2013-08-25 (×3): qty 1

## 2013-08-25 MED ORDER — ONDANSETRON HCL 4 MG/2ML IJ SOLN: 4.0000 mg | Freq: Four times a day (QID) | INTRAMUSCULAR | Status: DC | PRN

## 2013-08-25 MED ORDER — ACETAMINOPHEN 325 MG PO TABS: 650.0000 mg | ORAL_TABLET | Freq: Four times a day (QID) | ORAL | Status: DC | PRN

## 2013-08-25 MED ORDER — OXYCODONE HCL 5 MG PO TABS
5.0000 mg | ORAL_TABLET | Freq: Four times a day (QID) | ORAL | Status: DC | PRN
  Administered 2013-08-25 – 2013-08-27 (×7): 5 mg via ORAL
  Filled 2013-08-25 (×7): qty 1

## 2013-08-25 MED ORDER — ASPIRIN 325 MG PO TABS
325.0000 mg | ORAL_TABLET | Freq: Every day | ORAL | Status: DC
  Administered 2013-08-25 – 2013-08-27 (×3): 325 mg via ORAL
  Filled 2013-08-25 (×3): qty 1

## 2013-08-25 MED ORDER — LORAZEPAM 2 MG/ML IJ SOLN: 1.0000 mg | INTRAMUSCULAR | Status: DC | PRN

## 2013-08-25 MED ORDER — METOPROLOL TARTRATE 25 MG PO TABS: 25.0000 mg | ORAL_TABLET | Freq: Once | ORAL | Status: DC

## 2013-08-25 MED ORDER — ATORVASTATIN CALCIUM 80 MG PO TABS
80.0000 mg | ORAL_TABLET | Freq: Every day | ORAL | Status: DC
  Administered 2013-08-25 – 2013-08-27 (×3): 80 mg via ORAL
  Filled 2013-08-25 (×3): qty 1

## 2013-08-25 MED ORDER — ALPRAZOLAM 0.5 MG PO TABS
1.0000 mg | ORAL_TABLET | Freq: Three times a day (TID) | ORAL | Status: DC | PRN
  Administered 2013-08-25 – 2013-08-27 (×3): 1 mg via ORAL
  Filled 2013-08-25 (×3): qty 2

## 2013-08-25 MED ORDER — PNEUMOCOCCAL VAC POLYVALENT 25 MCG/0.5ML IJ INJ
0.5000 mL | INJECTION | INTRAMUSCULAR | Status: AC
  Administered 2013-08-26: 0.5 mL via INTRAMUSCULAR
  Filled 2013-08-25: qty 0.5

## 2013-08-25 MED ORDER — OXYCODONE-ACETAMINOPHEN 5-325 MG PO TABS
1.0000 | ORAL_TABLET | Freq: Four times a day (QID) | ORAL | Status: DC | PRN
  Administered 2013-08-25 – 2013-08-27 (×5): 1 via ORAL
  Filled 2013-08-25 (×5): qty 1

## 2013-08-25 MED ORDER — HEPARIN SODIUM (PORCINE) 5000 UNIT/ML IJ SOLN
5000.0000 [IU] | Freq: Three times a day (TID) | INTRAMUSCULAR | Status: DC
  Administered 2013-08-25 – 2013-08-27 (×7): 5000 [IU] via SUBCUTANEOUS
  Filled 2013-08-25 (×10): qty 1

## 2013-08-25 MED ORDER — BACITRACIN 500 UNIT/GM EX OINT
1.0000 "application " | TOPICAL_OINTMENT | Freq: Two times a day (BID) | CUTANEOUS | Status: DC
  Administered 2013-08-25 – 2013-08-27 (×6): 1 via TOPICAL
  Filled 2013-08-25 (×8): qty 0.9

## 2013-08-25 MED ORDER — DILTIAZEM HCL 100 MG IV SOLR
5.0000 mg/h | INTRAVENOUS | Status: DC
  Filled 2013-08-25: qty 100

## 2013-08-25 MED ORDER — OXYCODONE-ACETAMINOPHEN 10-325 MG PO TABS: 1.0000 | ORAL_TABLET | Freq: Four times a day (QID) | ORAL | Status: DC | PRN

## 2013-08-25 MED ORDER — ONDANSETRON HCL 4 MG PO TABS
4.0000 mg | ORAL_TABLET | Freq: Four times a day (QID) | ORAL | Status: DC | PRN
  Administered 2013-08-25: 4 mg via ORAL
  Filled 2013-08-25: qty 1

## 2013-08-25 MED ORDER — ALBUTEROL SULFATE (2.5 MG/3ML) 0.083% IN NEBU: 3.0000 mL | INHALATION_SOLUTION | Freq: Four times a day (QID) | RESPIRATORY_TRACT | Status: DC | PRN

## 2013-08-25 MED ORDER — ACETAMINOPHEN 650 MG RE SUPP: 650.0000 mg | Freq: Four times a day (QID) | RECTAL | Status: DC | PRN

## 2013-08-25 MED ORDER — SODIUM CHLORIDE 0.9 % IJ SOLN
3.0000 mL | Freq: Two times a day (BID) | INTRAMUSCULAR | Status: DC
  Administered 2013-08-25 – 2013-08-26 (×3): 3 mL via INTRAVENOUS

## 2013-08-25 MED ORDER — METOPROLOL TARTRATE 50 MG PO TABS
75.0000 mg | ORAL_TABLET | Freq: Two times a day (BID) | ORAL | Status: DC
  Administered 2013-08-25 – 2013-08-26 (×3): 75 mg via ORAL
  Filled 2013-08-25 (×5): qty 1

## 2013-08-25 MED ORDER — METOPROLOL TARTRATE 50 MG PO TABS
50.0000 mg | ORAL_TABLET | Freq: Two times a day (BID) | ORAL | Status: DC
  Administered 2013-08-25: 50 mg via ORAL
  Filled 2013-08-25 (×3): qty 1

## 2013-08-25 MED ORDER — FUROSEMIDE 40 MG PO TABS
40.0000 mg | ORAL_TABLET | Freq: Every morning | ORAL | Status: DC
  Administered 2013-08-25 – 2013-08-26 (×2): 40 mg via ORAL
  Filled 2013-08-25 (×3): qty 1

## 2013-08-25 MED ORDER — CITALOPRAM HYDROBROMIDE 20 MG PO TABS
20.0000 mg | ORAL_TABLET | Freq: Every day | ORAL | Status: DC
  Administered 2013-08-25 – 2013-08-27 (×3): 20 mg via ORAL
  Filled 2013-08-25 (×3): qty 1

## 2013-08-25 NOTE — Progress Notes (Signed)
[facility] TEAM 1 - Stepdown/ICU TEAM Progress Note  Douglas [name]:[id number] DOB: 05-04-1955 DOA: 08/24/2013 PCP: Pamelia Hoit, MD  Admit HPI / Brief Narrative: Douglas [name] is a 58 y.o.  PMHx Substance Abuse,  male with Past medical history of hypertension, CAD, ischemic cardiomyopathy, ICD, morbid obesity.  Patient presented with complaints of seizures. He mentions that since last several nights he has not been able to sleep due to severe back pain. Along with that he also has been having intermittent nausea and loose bowel motions since last few days. He has been taking Percocet for pain without any significant benefit has been lying down in the bed for the most part.  Today while he was out in the porch he started having seizure like activity and fell on the porch. EMS was called and patient was alert and oriented. CBC was normal. He had another seizure episode while he was at the EMS.  Patient was unable to provide any history related to this event as he does not remember the event. No further events while here in the hospital no confusion. He mentions he has unsteady gait since last few days. Does not complain of any focal neurological deficit.  As the patient's urine toxicology screen was positive for cannabinoids on further questioning he mentions that he was at his friend's house and his friends were using it, and he might have been possibly because of second hand smoking and he denies using any drugs.  The patient is coming from home. And at his baseline independent for most of his ADL.   HPI/Subjective: 6/30 patient admits that he smokes marijuana at a party, negative CP/SOB, positive low back pain  Assessment/Plan: Seizure  -Most likely secondary to patient's using marijuana -Patient counseled extensively concerning for sequela of continuing to use illicit drugs to include increased morbidity and mortality - ICD interrogation was done which was normal there was no  shock.  -Per neurology will hold all antibiotic medication  -EEG pending  -Continue Seizure prophylaxis.   A. fib RVR -Increase metoprolol 75 mg BID, if uncontrolled on increased metoprolol will need to consider Cardizem or amiodarone -DDX include pericarditis (unlikely), ischemic given patient's significant cardiac history will obtain echocardiogram, patient only mildly anemic (unlikely), hypo-/hyperthyroidism obtain TSH, PE (unlikely patient asymptomatic), sepsis on admission patient had a leukocytosis with mild left shift will obtain blood culture, urine culture -For now will maintain patient on heparin TID, if patient continues with A. fib will need to consider anticoagulation  Leukocytosis  -Trending down, afebrile negative left shift continue to monitor  Hypertension -Within AHA guideline see A. fib RVR  CHF? -Obtain echocardiogram  Back pain  -Patient appears to have sciatica  -We will get x-ray lumbar spine,  -continue with Flexeril hold Zanaflex continue with Percocet. - PTOT consultation    Code Status: FULL Family Communication: no family present at time of exam Disposition Plan: Per neurology    Consultants: Dr. Ritta Slot (neurology)   Procedure/Significant Events: 6/29 PCXR; No Active Dz 6/29 CT Head w/o contrast; No acute intracranial abnormality. No seizure focus identified.  - Right parietal scalp contusion and laceration. No evidence of acute cervical spine injury.     Culture 6/30 MRSA by PCR Positive    Antibiotics: NA   DVT prophylaxis: Heparin SQ   Devices NA   LINES / TUBES:  6/29 20ga right hand    Continuous Infusions:   Objective: VITAL SIGNS: Temp: 98.4 F (36.9 C) (06/30 1200)  Temp src: Oral (06/30 1200) BP: 103/65 mmHg (06/30 0713) Pulse Rate: 131 (06/30 1046) SPO2; 98% on room air FIO2:   Intake/Output Summary (Last 24 hours) at 08/25/13 1546 Last data filed at 08/25/13 1435  Gross per 24 hour    Intake      0 ml  Output   1700 ml  Net  -1700 ml     Exam: General: A./O. x4, NAD, No acute respiratory distress Lungs: Clear to auscultation bilaterally without wheezes or crackles Cardiovascular: Irregular irregular rhythm and rate, negative murmurs rubs or gallops, normal  S1 and S2 Abdomen: Morbidly obese, Nontender, nondistended, soft, bowel sounds positive, no rebound, no ascites, no appreciable mass Extremities: No significant cyanosis, clubbing, or edema bilateral lower extremities  Data Reviewed: Basic Metabolic Panel:  Recent Labs Lab 08/24/13 2246 08/24/13 2258 08/25/13 0104  NA 137 137  --   K 3.8 4.1  --   CL 105 95*  --   CO2  --  18*  --   GLUCOSE 200* 201*  --   BUN 20 21  --   CREATININE 1.10 1.04  --   CALCIUM  --  8.6  --   MG  --   --  1.8   Liver Function Tests: No results found for this basename: AST, ALT, ALKPHOS, BILITOT, PROT, ALBUMIN,  in the last 168 hours No results found for this basename: LIPASE, AMYLASE,  in the last 168 hours No results found for this basename: AMMONIA,  in the last 168 hours CBC:  Recent Labs Lab 08/24/13 2246 08/24/13 2258 08/25/13 0600  WBC  --  16.9* 19.3*  NEUTROABS  --  13.0* 14.1*  HGB 14.3 12.5* 12.9*  HCT 42.0 38.3* 39.0  MCV  --  92.5 92.6  PLT  --  261 274   Cardiac Enzymes:  Recent Labs Lab 08/24/13 2259  TROPONINI <0.30   BNP (last 3 results)  Recent Labs  08/24/13 2259  PROBNP 2056.0*   CBG:  Recent Labs Lab 08/24/13 2213 08/25/13 1149  GLUCAP 192* 130*    Recent Results (from the past 240 hour(s))  MRSA PCR SCREENING     Status: Abnormal   Collection Time    08/25/13  6:30 AM      Result Value Ref Range Status   MRSA by PCR POSITIVE (*) NEGATIVE Final   Comment:            The GeneXpert MRSA Assay (FDA     approved for NASAL specimens     only), is one component of a     comprehensive MRSA colonization     surveillance program. It is not     intended to diagnose MRSA      infection nor to guide or     monitor treatment for     MRSA infections.     RESULT CALLED TO, READ BACK BY AND VERIFIED WITH:     T.DAVIS,RN 08/25/13 0921 BY BSLADE     Studies:  Recent x-ray studies have been reviewed in detail by the Attending Physician  Scheduled Meds:  Scheduled Meds: . aspirin  325 mg Oral Daily  . atorvastatin  80 mg Oral Daily  . bacitracin  1 application Topical BID  . citalopram  20 mg Oral Daily  . furosemide  40 mg Oral q morning - 10a  . heparin  5,000 Units Subcutaneous 3 times per day  . metoprolol  5 mg Intravenous Once  . metoprolol tartrate  50 mg Oral BID  . pantoprazole  40 mg Oral Daily  . [START ON 08/26/2013] pneumococcal 23 valent vaccine  0.5 mL Intramuscular Tomorrow-1000  . sodium chloride  3 mL Intravenous Q12H    Time spent on care of this patient: 40 mins   Drema DallasWOODS, CURTIS, J , MD   [facility] Office  [phone] Pager [phone]- [phone]  On-Call/Text Page:      [email or web address]      password [email or web address]  If 7PM-7AM, please contact night-coverage [email or web address] Password [email or web address] 08/25/2013, 3:46 PM   LOS: 1 day

## 2013-08-25 NOTE — Progress Notes (Signed)
OT NOTE  Pt with Rt UE deficits s/p fall in January and increase deficit with AROM shoulder flexion, shoulder external rotation, shoulder adduction and internal rotation since this event. Pt requires BIL UE to complete shoulder flexion to 90 and reports pain at this range.   MD please address RT UE pain / decr ROM  Ot to establish AAROM HEP to help with incr ROM at this time    Mateo FlowJones, Brynn   OTR/L Pager: [phone] Office: [phone] .

## 2013-08-25 NOTE — H&P (Addendum)
[facility] History and Physical  Patient: Douglas [name]  WRU:[id number]  DOB: [birth year]  DOS: the patient was seen and examined on 08/25/2013 PCP: Pamelia Hoit, MD  Chief Complaint: Seizure  HPI: Douglas [name] is a 58 y.o. male with Past medical history of hypertension,  CAD, ischemic cardiomyopathy, ICD, morbid obesity. Patient presented with complaints of seizures. He mentions that since last several nights he has not been able to sleep due to severe back pain. Along with that he also has been having intermittent nausea and loose bowel motions since last few days. He has been taking Percocet for pain without any significant benefit has been lying down in the bed for the most part. Today while he was out in the porch he started having seizure like activity and fell on the porch. EMS was called and patient was alert and oriented. CBC was normal. He had another seizure episode while he was at the EMS. Patient was unable to provide any history related to this event as he does not remember the event. No further events while here in the hospital no confusion. He mentions he has unsteady gait since last few days. Does not complain of any focal neurological deficit. As the patient's urine toxicology screen was positive for cannabinoids on further questioning he mentions that he was at his friend's house and his friends were using it, and he might have been possibly because of second hand smoking and he denies using any drugs.  The patient is coming from home. And at his baseline independent for most of his ADL.  Review of Systems: as mentioned in the history of present illness.  A Comprehensive review of the other systems is negative.  Past Medical History  Diagnosis Date  . Hypertension   . Obesity   . Dyslipidemia   . Asthma   . Fatigue   . History of heart attack     times 4  . CAD (coronary artery disease)   . CHF (congestive heart failure)   . Pacemaker   .  Myocardial infarction    Past Surgical History  Procedure Laterality Date  . Coronary artery bypass graft  2002    times 4  . Cardiac catheterization  1996    stent placement  . Insert / replace / remove pacemaker     Social History:  reports that he quit smoking about 24 years ago. He does not have any smokeless tobacco history on file. He reports that he does not drink alcohol or use illicit drugs.  Allergies  Allergen Reactions  . Codeine Hives and Itching  . Vioxx [Rofecoxib] Palpitations    "Caused massive heart attack."    Family History  Problem Relation Age of Onset  . Heart attack Father     Prior to Admission medications   Medication Sig Start Date End Date Taking? Authorizing Provider  albuterol (PROVENTIL HFA;VENTOLIN HFA) 108 (90 BASE) MCG/ACT inhaler Inhale 2 puffs into the lungs every 6 (six) hours as needed. For wheezing.   Yes Historical Provider, MD  ALPRAZolam Prudy Feeler) 1 MG tablet Take 1 mg by mouth 3 (three) times daily as needed. For anxiety.   Yes Historical Provider, MD  aspirin 325 MG tablet Take 325 mg by mouth daily.   Yes Historical Provider, MD  atorvastatin (LIPITOR) 80 MG tablet Take 80 mg by mouth daily.   Yes Historical Provider, MD  citalopram (CELEXA) 20 MG tablet Take 20 mg by mouth daily.     Yes  Historical Provider, MD  cyclobenzaprine (FLEXERIL) 10 MG tablet Take 10 mg by mouth daily.     Yes Historical Provider, MD  fexofenadine (ALLEGRA) 180 MG tablet Take 180 mg by mouth daily.     Yes Historical Provider, MD  Fluticasone-Salmeterol (ADVAIR) 250-50 MCG/DOSE AEPB Inhale 1 puff into the lungs 2 (two) times daily.   Yes Historical Provider, MD  furosemide (LASIX) 40 MG tablet Take 40 mg by mouth every morning.   Yes Historical Provider, MD  lansoprazole (PREVACID) 30 MG capsule Take 30 mg by mouth daily.     Yes Historical Provider, MD  lisinopril (PRINIVIL,ZESTRIL) 10 MG tablet Take 10 mg by mouth daily.   Yes Historical Provider, MD   meloxicam (MOBIC) 15 MG tablet Take 15 mg by mouth daily.   Yes Historical Provider, MD  metoprolol (TOPROL-XL) 50 MG 24 hr tablet Take 50 mg by mouth daily.     Yes Historical Provider, MD  Multiple Vitamin (MULTIVITAMIN WITH MINERALS) TABS Take 1 tablet by mouth daily.   Yes Historical Provider, MD  Omega-3 Fatty Acids (FISH OIL) 1200 MG CAPS Take 1,200 mg by mouth daily.   Yes Historical Provider, MD  oxyCODONE-acetaminophen (PERCOCET) 10-325 MG per tablet Take 1 tablet by mouth every 6 (six) hours as needed for pain.   Yes Historical Provider, MD  tiZANidine (ZANAFLEX) 4 MG tablet Take 4 mg by mouth 2 (two) times daily.  09/29/11  Yes Historical Provider, MD  traZODone (DESYREL) 50 MG tablet Take 50 mg by mouth at bedtime.   Yes Historical Provider, MD    Physical Exam: Filed Vitals:   08/25/13 0145 08/25/13 0211 08/25/13 0357 08/25/13 0500  BP: 93/64 115/67 103/77   Pulse: 123 131 125   Temp:  98.2 F (36.8 C)    TempSrc:  Oral    Resp: 27 21 23    Height:  5\' 9"  (1.753 m)    Weight:  119.7 kg (263 lb 14.3 oz)  119.6 kg (263 lb 10.7 oz)  SpO2: 96% 95% 95%     General: Alert, Awake and Oriented to Time, Place and Person. Appear in mild distress Laceration on the back of the head Eyes: PERRL ENT: Oral Mucosa clear moist., trauma to toungue Neck: no JVD Cardiovascular: S1 and S2 Present, no Murmur, Peripheral Pulses Present Respiratory: Bilateral Air entry equal and Decreased, Clear to Auscultation,  no Crackles,no wheezes Abdomen: Bowel Sound Present, Soft and Non tender Skin: no Rash Extremities: Right gluteal, radiating to right back of the thigh, trace Pedal edema, no calf tenderness Neurologic: Grossly no focal neuro deficit.  Labs on Admission:  CBC:  Recent Labs Lab 08/24/13 2246 08/24/13 2258  WBC  --  16.9*  NEUTROABS  --  13.0*  HGB 14.3 12.5*  HCT 42.0 38.3*  MCV  --  92.5  PLT  --  261    CMP     Component Value Date/Time   NA 137 08/24/2013 2258   K  4.1 08/24/2013 2258   CL 95* 08/24/2013 2258   CO2 18* 08/24/2013 2258   GLUCOSE 201* 08/24/2013 2258   BUN 21 08/24/2013 2258   CREATININE 1.04 08/24/2013 2258   CALCIUM 8.6 08/24/2013 2258   PROT 7.6 01/06/2013 1149   ALBUMIN 4.1 01/06/2013 1149   AST 18 01/06/2013 1149   ALT 20 01/06/2013 1149   ALKPHOS 73 01/06/2013 1149   BILITOT 0.9 01/06/2013 1149   GFRNONAA 78* 08/24/2013 2258   GFRAA >90 08/24/2013 2258  No results found for this basename: LIPASE, AMYLASE,  in the last 168 hours No results found for this basename: AMMONIA,  in the last 168 hours   Recent Labs Lab 08/24/13 2259  TROPONINI <0.30   BNP (last 3 results)  Recent Labs  08/24/13 2259  PROBNP 2056.0*    Radiological Exams on Admission: Ct Head Wo Contrast  08/24/2013   CLINICAL DATA:  Fall.  Seizures.  Altered mental status  EXAM: CT HEAD WITHOUT CONTRAST  CT CERVICAL SPINE WITHOUT CONTRAST  TECHNIQUE: Multidetector CT imaging of the head and cervical spine was performed following the standard protocol without intravenous contrast. Multiplanar CT image reconstructions of the cervical spine were also generated.  COMPARISON:  None.  FINDINGS: CT HEAD FINDINGS  Skull and Sinuses:Subcutaneous gas in the right suboccipital scalp compatible with laceration. No associated fracture. No radiopaque foreign body. The mastoids, middle ears, and paranasal sinuses are clear.  Orbits: No acute abnormality.  Brain: No evidence of acute abnormality, such as acute infarction, hemorrhage, hydrocephalus, or mass lesion/mass effect.  CT CERVICAL SPINE FINDINGS  Negative for acute fracture or subluxation. No prevertebral edema. No gross cervical canal hematoma. Focally advanced degenerative disc disease at C5-6 and C6-7 with marked disc narrowing, sclerosis, and endplate spurring. There is multilevel facet osteoarthritis, likely accounting for trace C4-5 anterolisthesis.  IMPRESSION: 1. No acute intracranial abnormality.  No seizure focus  identified. 2. Right parietal scalp contusion and laceration. No associated fracture. 3. No evidence of acute cervical spine injury.   Electronically Signed   By: Tiburcio PeaJonathan  Watts M.D.   On: 08/24/2013 23:22   Ct Cervical Spine Wo Contrast  08/24/2013   CLINICAL DATA:  Fall.  Seizures.  Altered mental status  EXAM: CT HEAD WITHOUT CONTRAST  CT CERVICAL SPINE WITHOUT CONTRAST  TECHNIQUE: Multidetector CT imaging of the head and cervical spine was performed following the standard protocol without intravenous contrast. Multiplanar CT image reconstructions of the cervical spine were also generated.  COMPARISON:  None.  FINDINGS: CT HEAD FINDINGS  Skull and Sinuses:Subcutaneous gas in the right suboccipital scalp compatible with laceration. No associated fracture. No radiopaque foreign body. The mastoids, middle ears, and paranasal sinuses are clear.  Orbits: No acute abnormality.  Brain: No evidence of acute abnormality, such as acute infarction, hemorrhage, hydrocephalus, or mass lesion/mass effect.  CT CERVICAL SPINE FINDINGS  Negative for acute fracture or subluxation. No prevertebral edema. No gross cervical canal hematoma. Focally advanced degenerative disc disease at C5-6 and C6-7 with marked disc narrowing, sclerosis, and endplate spurring. There is multilevel facet osteoarthritis, likely accounting for trace C4-5 anterolisthesis.  IMPRESSION: 1. No acute intracranial abnormality.  No seizure focus identified. 2. Right parietal scalp contusion and laceration. No associated fracture. 3. No evidence of acute cervical spine injury.   Electronically Signed   By: Tiburcio PeaJonathan  Watts M.D.   On: 08/24/2013 23:22   Dg Chest Port 1 View  08/24/2013   CLINICAL DATA:  Altered mental status after fall and seizure  EXAM: PORTABLE CHEST - 1 VIEW  COMPARISON:  09/11/2011  FINDINGS: There is a dual-chamber right approach ICD/ pacer. Two right ventricular leads are present. Lead orientation is unchanged. Mild cardiomegaly which  is stable. Previous median sternotomy for CABG. No edema, consolidation, effusion, or pneumothorax.  IMPRESSION: No active disease.   Electronically Signed   By: Tiburcio PeaJonathan  Watts M.D.   On: 08/24/2013 22:46    EKG: Independently reviewed. atrial fibrillation, RVR.  Assessment/Plan Principal Problem:   Seizure Active  Problems:   Essential hypertension, benign   CARDIOMYOPATHY, ISCHEMIC   ICD-[employer]   CAD (coronary artery disease)   Atrial fibrillation   1. Seizure Patient presents with a seizure-like activity. ICD interrogation was done which was normal there was no shock. Patient has history of DVT leading to fall in the last couple of months. Possible etiology is sleep deprivation, medication side effect. At present the patient will be admitted to step down unit for observation. I will use IV lorazepam for breakthrough seizures. He has received Keppra in the ED. Further recommendation by neurology. EEG in the morning. Seizure prophylaxis.  2. Leukocytosis Patient has significant leukocytosis with possible left shift but does not have any signs of infection. Does not have any fever. At present we will monitor him and if he develops fever given empirically treated with levofloxacin.  3. ICD, coronary artery disease, hypertension, atrial fibrillation Does not have any evidence of prior history of atrial fibrillation. He may require anticoagulation as he has hypertension, CHF.  4. Back pain Patient appears to have sciatica We will get x-ray lumbar spine, continue with Flexeril hold Zanaflex continue with Percocet. PTOT consultation.  Consults: Neurology  DVT Prophylaxis: subcutaneous Heparin Nutrition: Cardiac diet  Code Status: Full  Disposition: Admitted to inpatient in step-down unit.  Author: Lynden Oxford, MD [facility] Pager: [phone] 08/25/2013, 6:05 AM    If 7PM-7AM, please contact night-coverage [email or web address] Password [email or web address]  **Disclaimer: This  note may have been dictated with voice recognition software. Similar sounding words can inadvertently be transcribed and this note may contain transcription errors which may not have been corrected upon publication of note.**

## 2013-08-25 NOTE — ED Provider Notes (Signed)
I performed a history and physical examination of  Douglas [name] and discussed his management with Dr. Alycia Rossettiyan. I agree with the history, physical, assessment, and plan of care, with the following exceptions: None I was present for the following procedures: LACERATION REPAIR  Time Spent in Critical Care of the patient: 40 min  Time spent in discussions with the patient and family: 15 min  Nanavati, Ankit  Pt with cc of seizure and ams. Reported new onset seizure. Also noted to be in afib with RVR. New onset afib and seizures per reported hx from EMS, patient unable to provide much hx at arrival and is post ictal.  Afib with RVR - hx of CHF, will give metop 5 mg ivp x 3 as tolerated. GOAL HR < 110. Might need anticoagulation, based on CHADs 2 score. Unknown etiolohy currently.  Seizure - new onset as well. Neurology consulted. 2 episodes, but not a status epilepticus case.  Appropriate imaging and labs ordered - with lactic acidosis seen. Lac repaired. Admit.  CRITICAL CARE Performed by: Derwood KaplanNanavati, Ankit   Total critical care time: 40 min - afib with RVR requiring iv meds.  Critical care time was exclusive of separately billable procedures and treating other patients.  Critical care was necessary to treat or prevent imminent or life-threatening deterioration.  Critical care was time spent personally by me on the following activities: development of treatment plan with patient and/or surrogate as well as nursing, discussions with consultants, evaluation of patient's response to treatment, examination of patient, obtaining history from patient or surrogate, ordering and performing treatments and interventions, ordering and review of laboratory studies, ordering and review of radiographic studies, pulse oximetry and re-evaluation of patient's condition.      Derwood KaplanAnkit Nanavati, MD 08/25/13 1538

## 2013-08-25 NOTE — Progress Notes (Signed)
Utilization review completed.  

## 2013-08-25 NOTE — Evaluation (Signed)
Occupational Therapy Evaluation Patient Details Name: Douglas PaganiniRobert G [name] MRN: [id number] DOB: 07/16/1955 Today's Date: 08/25/2013    History of Present Illness [age] y.o. [facility] with seizure, fall, posterior head lac.  Neuro involved and seizure workup pending.  Pt with significant PMHx of ischemic cardiomyopathy, CHF, CAD, s/p ICD, HTN, MI, CABG (4 vessel).     Clinical Impression   PT admitted with seizure s/p fall with staples in scalp. Pt currently with functional limitiations due to the deficits listed below (see OT problem list). Pt lives alone and does not drive. Pt plans to walk dog with neighbor.  Pt will benefit from skilled OT to increase their independence and safety with adls and balance to allow discharge HHOT. Ot to address RT UE with HEP and balance with adls next session.     Follow Up Recommendations  Home health OT    Equipment Recommendations  3 in 1 bedside comode    Recommendations for Other Services       Precautions / Restrictions Precautions Precautions: Fall Precaution Comments: h/o falls      Mobility Bed Mobility Overal bed mobility: Modified Independent             General bed mobility comments: in chair on arrival  Transfers Overall transfer level: Needs assistance Equipment used: None Transfers: Sit to/from Stand Sit to Stand: Min guard         General transfer comment: Min guard assist for safety    Balance Overall balance assessment: Needs assistance;History of Falls Sitting-balance support: Feet supported;No upper extremity supported Sitting balance-Leahy Scale: Good     Standing balance support: No upper extremity supported Standing balance-Leahy Scale: Fair                              ADL Overall ADL's : Needs assistance/impaired     Grooming: Set up Grooming Details (indicate cue type and reason): INCR TIME     Lower Body Bathing: Minimal assistance       Lower Body Dressing:  Min guard;Sit to/from stand Lower Body Dressing Details (indicate cue type and reason): pt demonstrates deficits with RT UE. Pt don doff sock with bil UE with incr time               General ADL Comments: pt main concern entire sesssion was Rt UE. pt very quick to talk off on a tangent and required redirection to direct topic at hand. pt reports falling in january and decr progressive use of RT UE. pt is able to do AAROM with LT UE to 90 degrees.  Pt educated on AAROM shoulder flexion bil UE , elbow flexoin, supination/ pronation and digit ARom until further MD orders provided     Vision                     Perception     Praxis      Pertinent Vitals/Pain 140 sitting     Hand Dominance Right   Extremity/Trunk Assessment Upper Extremity Assessment Upper Extremity Assessment: RUE deficits/detail RUE Deficits / Details: AROM shoulder flexion, shoulder external rotation, shoulder adduction and internal rotation since this event. Pt requires BIL UE to complete shoulder flexion to 90 and reports pain at this range.  RUE: Unable to fully assess due to pain   Lower Extremity Assessment Lower Extremity Assessment: Defer to PT evaluation   Cervical / Trunk Assessment Cervical /  Trunk Assessment: Normal   Communication Communication Communication: No difficulties   Cognition Arousal/Alertness: Awake/alert Behavior During Therapy: WFL for tasks assessed/performed Overall Cognitive Status: Within Functional Limits for tasks assessed                     General Comments       Exercises       Shoulder Instructions      Home Living Family/patient expects to be discharged to:: Private residence Living Arrangements: Alone Available Help at Discharge: Family;Available PRN/intermittently Type of Home: Mobile home Home Access: Stairs to enter Entrance Stairs-Number of Steps: 4 Entrance Stairs-Rails: None Home Layout: One level     Bathroom Shower/Tub:  Chief Strategy OfficerTub/shower unit   Bathroom Toilet: Standard     Home Equipment: Cane - single point;Other (comment) (hiking stick)          Prior Functioning/Environment Level of Independence: Independent        Comments: Does not drive, has a dog    OT Diagnosis: Generalized weakness   OT Problem List: Decreased strength;Decreased activity tolerance;Decreased safety awareness;Decreased knowledge of use of DME or AE;Decreased knowledge of precautions   OT Treatment/Interventions: Self-care/ADL training;Therapeutic exercise;Neuromuscular education;Therapeutic activities;Patient/family education;Balance training    OT Goals(Current goals can be found in the care plan section) Acute Rehab OT Goals Patient Stated Goal: to return home and walk dog OT Goal Formulation: With patient Time For Goal Achievement: 09/08/13 Potential to Achieve Goals: Good  OT Frequency: Min 2X/week   Barriers to D/C:            Co-evaluation              End of Session Nurse Communication: Mobility status;Precautions  Activity Tolerance: Patient tolerated treatment well Patient left: in chair;with call bell/phone within reach   Time: 1202-1220 OT Time Calculation (min): 18 min Charges:  OT General Charges $OT Visit: 1 Procedure OT Evaluation $Initial OT Evaluation Tier I: 1 Procedure OT Treatments $Self Care/Home Management : 8-22 mins G-Codes:    Harolyn RutherfordJones, Jessica B 08/25/2013, 3:02 PM Pager: [phone]

## 2013-08-25 NOTE — Progress Notes (Signed)
EEG Completed; Results Pending  

## 2013-08-25 NOTE — Evaluation (Signed)
Physical Therapy Evaluation Patient Details Name: Douglas PaganiniRobert G Bucher MRN: [id number] DOB: 03/26/1955 Today's Date: 08/25/2013   History of Present Illness  58 [facility]. [facility] with seizure, fall, posterior head lac.  Neuro involved and seizure workup pending.  Pt with significant PMHx of ischemic cardiomyopathy, CHF, CAD, s/p ICD, HTN, MI, CABG (4 vessel).    Clinical Impression  Pt is mobilizing likely close to his baseline, however, balance deficits are evident.  He would likely be safer with a little external support (cane).  He has a history of falls (actual mechanical falls) and would benefit from HHPT to help with balance and gait training.  PT will follow acutely.  We will try a cane next session to see if he is more stable with a cane alone.     Follow Up Recommendations Home health PT    Equipment Recommendations  None recommended by PT    Recommendations for Other Services   NA    Precautions / Restrictions Precautions Precautions: Fall Precaution Comments: h/o falls      Mobility  Bed Mobility Overal bed mobility: Modified Independent                Transfers Overall transfer level: Needs assistance Equipment used: None Transfers: Sit to/from Stand Sit to Stand: Min guard         General transfer comment: Min guard assist for safety  Ambulation/Gait Ambulation/Gait assistance: Min assist Ambulation Distance (Feet): 400 Feet Assistive device: None Gait Pattern/deviations: Step-through pattern;Staggering left;Staggering right     General Gait Details: Staggering gait pattern intensified by walking with head turns.  Staggering pattern while multitasking and while turning corners.  Pt seems to indicate that this is his baseline and also reports h/o falls.  We did discuss the use of his cane (walking stick) for increased stability         Balance Overall balance assessment: Needs assistance;History of Falls Sitting-balance support:  Feet supported;No upper extremity supported Sitting balance-Leahy Scale: Good     Standing balance support: No upper extremity supported Standing balance-Leahy Scale: Fair                               Pertinent Vitals/Pain HR up to 131 during gait (pt reports it is because he has not taken his anxiety meds)    Home Living Family/patient expects to be discharged to:: Private residence Living Arrangements: Alone Available Help at Discharge: Family;Available PRN/intermittently Type of Home: Mobile home Home Access: Stairs to enter Entrance Stairs-Rails: None Entrance Stairs-Number of Steps: 4 Home Layout: One level Home Equipment: Cane - single point;Other (comment) (hiking stick is what he uses as a cane)      Prior Function Level of Independence: Independent         Comments: Does not drive, has a dog     Extremity/Trunk Assessment   Upper Extremity Assessment: Defer to OT evaluation           Lower Extremity Assessment: Overall WFL for tasks assessed (grossly equal and he had no signs of weakness during gait)      Cervical / Trunk Assessment: Normal  Communication   Communication: No difficulties;Other (comment) (very verbose)  Cognition Arousal/Alertness: Awake/alert Behavior During Therapy: WFL for tasks assessed/performed Overall Cognitive Status: Within Functional Limits for tasks assessed (not specificallly tested)  General Comments General comments (skin integrity, edema, etc.): HR increased to 131 during gait          Assessment/Plan    PT Assessment Patient needs continued PT services  PT Diagnosis Difficulty walking;Abnormality of gait   PT Problem List Decreased balance;Decreased mobility;Decreased activity tolerance;Decreased knowledge of use of DME;Cardiopulmonary status limiting activity;Obesity  PT Treatment Interventions DME instruction;Gait training;Stair training;Functional mobility  training;Therapeutic activities;Therapeutic exercise;Balance training;Neuromuscular re-education;Patient/family education   PT Goals (Current goals can be found in the Care Plan section) Acute Rehab PT Goals Patient Stated Goal: to not fall again, and to avoid having another seizure or have his defibrillator go off.  PT Goal Formulation: With patient Time For Goal Achievement: 09/08/13 Potential to Achieve Goals: Good    Frequency Min 3X/week   Barriers to discharge Decreased caregiver support lives alone       End of Session Equipment Utilized During Treatment: Gait belt Activity Tolerance: Patient tolerated treatment well Patient left: in chair;with call bell/phone within reach           Time: [id number] PT Time Calculation (min): 29 min   Charges:   PT Evaluation $Initial PT Evaluation Tier I: 1 Procedure PT Treatments $Gait Training: 8-22 mins        Rebecca B. Medendorp, PT, DPT [phone]   08/25/2013, 1:49 PM

## 2013-08-25 NOTE — Consult Note (Signed)
Neurology Consultation Reason for Consult: Seizure Referring Physician: Rhunette CroftNanavati, A  CC: Seizure  History is obtained from: Patient  HPI: Douglas [name] is a 58 y.o. male with a history of febrile seizures as an infant who presents with 2 seizures that happened earlier tonight. He received Ativan with his second seizure. He currently is completely back to baseline. He states that he was standing talking to some people and then suddenly the next thing he knew he had had a seizure. EMS reported another seizure en route. He apparently had eye deviation generalized shaking, however I do not have anyone who witnessed the episode interview at this time.  He takes Xanax, but has not no changes in his routine, he took his doses morning.  ROS: A 14 point ROS was performed and is negative except as noted in the HPI.   Past Medical History  Diagnosis Date  . Hypertension   . Obesity   . Dyslipidemia   . Asthma   . Fatigue   . History of heart attack     times 4  . CAD (coronary artery disease)   . CHF (congestive heart failure)   . Pacemaker   . Myocardial infarction     Family History: He thinks there are some people with seizures on his mother's side of the family  Social History: Tob: Previous smoker  Exam: Current vital signs: BP 92/66  Pulse 117  Resp 20  SpO2 96% Vital signs in last 24 hours: Pulse Rate:  [114-146] 117 (06/29 2338) Resp:  [17-25] 20 (06/29 2338) BP: (84-134)/(42-91) 92/66 mmHg (06/29 2338) SpO2:  [95 %-99 %] 96 % (06/29 2338)  General: In bed, NAD CV: Regular rate and rhythm Mental Status: Patient is awake, alert, oriented to person, place, month, year, and situation. Immediate and remote memory are intact. Patient is able to give a clear and coherent history. No signs of aphasia or neglect Cranial Nerves: II: Visual Fields are full. Pupils are equal, round, and reactive to light.  Discs are difficult to visualize. III,IV, VI: EOMI without ptosis  or diploplia.  V: Facial sensation is symmetric to temperature VII: Facial movement is symmetric.  VIII: hearing is intact to voice X: Uvula elevates symmetrically XI: Shoulder shrug is symmetric. XII: tongue is midline without atrophy or fasciculations.  Motor: Tone is normal. Bulk is normal. 5/5 strength was present in all four extremities.  Sensory: Sensation is symmetric to light touch and temperature in the arms and legs. Deep Tendon Reflexes: 2+ and symmetric in the biceps and patellae.  Cerebellar: FNF intact bilaterally Gait: Not tested secondary to multiple monitors in the setting.        I have reviewed labs in epic and the results pertinent to this consultation are: Unremarkable BMP  I have reviewed the images obtained: CT head-negative except for scalp hematoma  Impression: 58 year old male with multiple medical problems who presents with new onset seizure. Though he had 2 seizures, given the proximity in time this would be considered a single episode. He has a history of febrile seizures, but these are not felt to be epileptic in nature. Therefore, this would be a single unprovoked seizure. I would favor getting an EEG, and given the 2 episodes would favor observation.  Recommendations: 1) admit for observation 2) EEG 3) would hold on antiepileptic therapy for now.  Ritta SlotMcNeill Kirkpatrick, MD [facility] [phone]  If 7pm- 7am, please page neurology on call as listed in AMION.

## 2013-08-25 NOTE — Procedures (Signed)
ELECTROENCEPHALOGRAM REPORT  Patient: Douglas [name]       Room #: 1O103S15 EEG No. ID: [id number] Age: 58 y.o.        Sex: male Referring Physician: Joseph ArtWoods Report Date:  08/25/2013        Interpreting Physician: Aline BrochureSTEWART,CHARLES R  History: Douglas [name] is an 58 y.o. male was admitted following 2 generalized seizures. Patient has a history of febrile seizures as an infant.  Indications for study:  Rule out seizure disorder.  Technique: This is an 18 channel routine scalp EEG performed at the bedside with bipolar and monopolar montages arranged in accordance to the international 10/20 system of electrode placement.   Description: This EEG recording was performed during wakefulness and during sleep. Background activity during wakefulness consisted of 10-11 Hz alpha rhythm which attenuates well with eye opening. Photic stimulation produced a symmetrical occipital driving response. Hyperventilation was not performed. There was slowing of background activity with mixed irregular delta and theta activity during sleep. Symmetrical vertex waves, sleep spindles and K-complexes were recorded during stage II sleep. No epileptiform discharges occurred during wakefulness during sleep. There was no abnormal slowing of cerebral activity.  Interpretation: This is a normal EEG recorded during wakefulness and during sleep. No evidence of an epileptic disorder was seen.   Venetia MaxonR Stewart M.D. [facility] [phone]

## 2013-08-26 DIAGNOSIS — I2589 Other forms of chronic ischemic heart disease: Secondary | ICD-10-CM

## 2013-08-26 DIAGNOSIS — I517 Cardiomegaly: Secondary | ICD-10-CM

## 2013-08-26 DIAGNOSIS — F1910 Other psychoactive substance abuse, uncomplicated: Secondary | ICD-10-CM

## 2013-08-26 DIAGNOSIS — M5137 Other intervertebral disc degeneration, lumbosacral region: Secondary | ICD-10-CM

## 2013-08-26 DIAGNOSIS — E785 Hyperlipidemia, unspecified: Secondary | ICD-10-CM

## 2013-08-26 DIAGNOSIS — M47817 Spondylosis without myelopathy or radiculopathy, lumbosacral region: Secondary | ICD-10-CM | POA: Diagnosis present

## 2013-08-26 DIAGNOSIS — D72829 Elevated white blood cell count, unspecified: Secondary | ICD-10-CM

## 2013-08-26 LAB — COMPREHENSIVE METABOLIC PANEL
ALK PHOS: 93 U/L (ref 39–117)
ALT: 33 U/L (ref 0–53)
AST: 31 U/L (ref 0–37)
Albumin: 2.7 g/dL — ABNORMAL LOW (ref 3.5–5.2)
Anion gap: 11 (ref 5–15)
BILIRUBIN TOTAL: 0.3 mg/dL (ref 0.3–1.2)
BUN: 12 mg/dL (ref 6–23)
CHLORIDE: 99 meq/L (ref 96–112)
CO2: 28 mEq/L (ref 19–32)
Calcium: 8.7 mg/dL (ref 8.4–10.5)
Creatinine, Ser: 0.73 mg/dL (ref 0.50–1.35)
GFR calc Af Amer: >90 mL/min (ref >90)
GFR calc non Af Amer: >90 mL/min (ref >90)
Glucose, Bld: 94 mg/dL (ref 70–99)
POTASSIUM: 4 meq/L (ref 3.7–5.3)
SODIUM: 138 meq/L (ref 137–147)
TOTAL PROTEIN: 7.2 g/dL (ref 6.0–8.3)

## 2013-08-26 LAB — CBC WITH DIFFERENTIAL/PLATELET
BASOS ABS: 0 10*3/uL (ref 0.0–0.1)
Basophils Relative: 0 % (ref 0–1)
EOS ABS: 0.3 10*3/uL (ref 0.0–0.7)
Eosinophils Relative: 2 % (ref 0–5)
HCT: 39 % (ref 39.0–52.0)
Hemoglobin: 12.8 g/dL — ABNORMAL LOW (ref 13.0–17.0)
LYMPHS ABS: 3.6 10*3/uL (ref 0.7–4.0)
Lymphocytes Relative: 25 % (ref 12–46)
MCH: 30.8 pg (ref 26.0–34.0)
MCHC: 32.8 g/dL (ref 30.0–36.0)
MCV: 93.8 fL (ref 78.0–100.0)
Monocytes Absolute: 1.2 10*3/uL — ABNORMAL HIGH (ref 0.1–1.0)
Monocytes Relative: 8 % (ref 3–12)
NEUTROS ABS: 9.4 10*3/uL — AB (ref 1.7–7.7)
Neutrophils Relative %: 65 % (ref 43–77)
Platelets: 317 10*3/uL (ref 150–400)
RBC: 4.16 MIL/uL — ABNORMAL LOW (ref 4.22–5.81)
RDW: 14 % (ref 11.5–15.5)
WBC: 14.5 10*3/uL — ABNORMAL HIGH (ref 4.0–10.5)

## 2013-08-26 LAB — T4, FREE: FREE T4: 1.27 ng/dL (ref 0.80–1.80)

## 2013-08-26 LAB — MAGNESIUM: Magnesium: 1.6 mg/dL (ref 1.5–2.5)

## 2013-08-26 MED ORDER — MUPIROCIN 2 % EX OINT
1.0000 | TOPICAL_OINTMENT | Freq: Two times a day (BID) | CUTANEOUS | Status: DC
Start: 2013-08-26 — End: 2013-08-27
  Administered 2013-08-26: 1 via NASAL
  Filled 2013-08-26 (×2): qty 22

## 2013-08-26 MED ORDER — DILTIAZEM HCL ER 60 MG PO CP12
60.0000 mg | ORAL_CAPSULE | Freq: Two times a day (BID) | ORAL | Status: DC
  Administered 2013-08-26 – 2013-08-27 (×2): 60 mg via ORAL
  Filled 2013-08-26 (×4): qty 1

## 2013-08-26 MED ORDER — MUPIROCIN 2 % EX OINT
TOPICAL_OINTMENT | CUTANEOUS | Status: AC
  Filled 2013-08-26: qty 22

## 2013-08-26 MED ORDER — CHLORHEXIDINE GLUCONATE CLOTH 2 % EX PADS: 6.0000 | MEDICATED_PAD | Freq: Every day | CUTANEOUS | Status: DC

## 2013-08-26 NOTE — Progress Notes (Signed)
  Echocardiogram 2D Echocardiogram with Definity has been performed.  Douglas [name] FRANCES 08/26/2013, 2:56 PM

## 2013-08-26 NOTE — Progress Notes (Signed)
Subjective: Patient without further seizure activity. Admits to extreme sleep deprivation and new medications for sleep.  No new complaints.    Objective: Current vital signs: BP 102/73  Pulse 93  Temp(Src) 98.5 F (36.9 C) (Oral)  Resp 16  Ht 5\' 9"  (1.753 m)  Wt 119.6 kg (263 lb 10.7 oz)  BMI 38.92 kg/m2  SpO2 98% Vital signs in last 24 hours: Temp:  [98 F (36.7 C)-98.6 F (37 C)] 98.5 F (36.9 C) (07/01 1627) Pulse Rate:  [93-129] 93 (07/01 1120) Resp:  [16-23] 16 (07/01 1120) BP: (99-128)/(51-108) 102/73 mmHg (07/01 1120) SpO2:  [95 %-98 %] 98 % (07/01 1120)  Intake/Output from previous day: 06/30 0701 - 07/01 0700 In: 240 [P.O.:240] Out: 1200 [Urine:1200] Intake/Output this shift: Total I/O In: -  Out: 600 [Urine:600] Nutritional status: Cardiac  Neurologic Exam: Mental Status:  Patient is awake, alert, oriented to person, place, month, year, and situation.  Immediate and remote memory are intact.  Patient is able to give a clear and coherent history.  No signs of aphasia or neglect  Cranial Nerves:  II: Visual Fields are full. Pupils are equal, round, and reactive to light. Discs are difficult to visualize.  III,IV, VI: EOMI without ptosis or diploplia.  V: Facial sensation is symmetric to temperature  VII: Facial movement is symmetric.  VIII: hearing is intact to voice  X: Uvula elevates symmetrically  XI: Shoulder shrug is symmetric.  XII: tongue is midline without atrophy or fasciculations.  Motor:  Tone is normal. Bulk is normal. 5/5 strength was present in all four extremities.  Sensory:  Sensation is symmetric to light touch and temperature in the arms and legs.  Deep Tendon Reflexes:  2+ and symmetric in the biceps and patellae.  Cerebellar:  FNF intact bilaterally   Lab Results: Basic Metabolic Panel:  Recent Labs Lab 08/24/13 2246 08/24/13 2258 08/25/13 0104  NA 137 137  --   K 3.8 4.1  --   CL 105 95*  --   CO2  --  18*  --    GLUCOSE 200* 201*  --   BUN 20 21  --   CREATININE 1.10 1.04  --   CALCIUM  --  8.6  --   MG  --   --  1.8    Liver Function Tests: No results found for this basename: AST, ALT, ALKPHOS, BILITOT, PROT, ALBUMIN,  in the last 168 hours No results found for this basename: LIPASE, AMYLASE,  in the last 168 hours No results found for this basename: AMMONIA,  in the last 168 hours  CBC:  Recent Labs Lab 08/24/13 2246 08/24/13 2258 08/25/13 0600  WBC  --  16.9* 19.3*  NEUTROABS  --  13.0* 14.1*  HGB 14.3 12.5* 12.9*  HCT 42.0 38.3* 39.0  MCV  --  92.5 92.6  PLT  --  261 274    Cardiac Enzymes:  Recent Labs Lab 08/24/13 2259  TROPONINI <0.30    Lipid Panel: No results found for this basename: CHOL, TRIG, HDL, CHOLHDL, VLDL, LDLCALC,  in the last 168 hours  CBG:  Recent Labs Lab 08/24/13 2213 08/25/13 1149  GLUCAP 192* 130*    Microbiology: Results for orders placed during the hospital encounter of 08/24/13  URINE CULTURE     Status: None   Collection Time    08/25/13  3:45 AM      Result Value Ref Range Status   Specimen Description URINE, CATHETERIZED   Final  Special Requests A   Final   Culture  Setup Time     Final   Value: 08/25/2013 12:45     Performed at [facility] Count PENDING   Incomplete   Culture     Final   Value: Culture reincubated for better growth     Performed at [facility]   Report Status PENDING   Incomplete  MRSA PCR SCREENING     Status: Abnormal   Collection Time    08/25/13  6:30 AM      Result Value Ref Range Status   MRSA by PCR POSITIVE (*) NEGATIVE Final   Comment:            The GeneXpert MRSA Assay (FDA     approved for NASAL specimens     only), is one component of a     comprehensive MRSA colonization     surveillance program. It is not     intended to diagnose MRSA     infection nor to guide or     monitor treatment for     MRSA infections.     RESULT CALLED TO, READ BACK BY AND  VERIFIED WITH:     T.DAVIS,RN 08/25/13 0921 BY BSLADE  CULTURE, BLOOD (ROUTINE X 2)     Status: None   Collection Time    08/25/13  9:00 PM      Result Value Ref Range Status   Specimen Description BLOOD LEFT ARM   Final   Special Requests BOTTLES DRAWN AEROBIC ONLY 10CC   Final   Culture PENDING   Incomplete   Report Status PENDING   Incomplete  CULTURE, BLOOD (ROUTINE X 2)     Status: None   Collection Time    08/25/13  9:05 PM      Result Value Ref Range Status   Specimen Description BLOOD LEFT HAND   Final   Special Requests BOTTLES DRAWN AEROBIC ONLY 4CC   Final   Culture PENDING   Incomplete   Report Status PENDING   Incomplete    Coagulation Studies:  Recent Labs  08/24/13 2258 08/25/13 0600  LABPROT 15.9* 15.6*  INR 1.27 1.24    Imaging: Dg Lumbar Spine 2-3 Views  08/25/2013   CLINICAL DATA:  58 year old male with low back pain, sciatica. Initial encounter. Reports prior surgery.  EXAM: LUMBAR SPINE - 2-3 VIEW  COMPARISON:  Lumbar spine CT 02/02/2011.  FINDINGS: Same numbering system as on the comparison, designating chronic vacuum disc phenomena at L5-S1, and full size ribs at T12. Stable vertebral height and alignment. Chronic disc and endplate degeneration at L5-S1 appears stable. Trace anterolisthesis at L4-L5 appears stable. Stable disc spaces. Bone mineralization is within normal limits. Grossly intact visualized lower thoracic levels which demonstrate bulky degenerative endplate spurring. Sacral ala and SI joints within normal limits.  IMPRESSION: No acute osseous abnormality identified in the lumbar spine. Stable severe L5-S1 disc and endplate degeneration.   Electronically Signed   By: Augusto Gamble M.D.   On: 08/25/2013 21:11   Ct Head Wo Contrast  08/24/2013   CLINICAL DATA:  Fall.  Seizures.  Altered mental status  EXAM: CT HEAD WITHOUT CONTRAST  CT CERVICAL SPINE WITHOUT CONTRAST  TECHNIQUE: Multidetector CT imaging of the head and cervical spine was performed  following the standard protocol without intravenous contrast. Multiplanar CT image reconstructions of the cervical spine were also generated.  COMPARISON:  None.  FINDINGS: CT HEAD FINDINGS  Skull and Sinuses:Subcutaneous gas in the right suboccipital scalp compatible with laceration. No associated fracture. No radiopaque foreign body. The mastoids, middle ears, and paranasal sinuses are clear.  Orbits: No acute abnormality.  Brain: No evidence of acute abnormality, such as acute infarction, hemorrhage, hydrocephalus, or mass lesion/mass effect.  CT CERVICAL SPINE FINDINGS  Negative for acute fracture or subluxation. No prevertebral edema. No gross cervical canal hematoma. Focally advanced degenerative disc disease at C5-6 and C6-7 with marked disc narrowing, sclerosis, and endplate spurring. There is multilevel facet osteoarthritis, likely accounting for trace C4-5 anterolisthesis.  IMPRESSION: 1. No acute intracranial abnormality.  No seizure focus identified. 2. Right parietal scalp contusion and laceration. No associated fracture. 3. No evidence of acute cervical spine injury.   Electronically Signed   By: Tiburcio PeaJonathan  Watts M.D.   On: 08/24/2013 23:22   Ct Cervical Spine Wo Contrast  08/24/2013   CLINICAL DATA:  Fall.  Seizures.  Altered mental status  EXAM: CT HEAD WITHOUT CONTRAST  CT CERVICAL SPINE WITHOUT CONTRAST  TECHNIQUE: Multidetector CT imaging of the head and cervical spine was performed following the standard protocol without intravenous contrast. Multiplanar CT image reconstructions of the cervical spine were also generated.  COMPARISON:  None.  FINDINGS: CT HEAD FINDINGS  Skull and Sinuses:Subcutaneous gas in the right suboccipital scalp compatible with laceration. No associated fracture. No radiopaque foreign body. The mastoids, middle ears, and paranasal sinuses are clear.  Orbits: No acute abnormality.  Brain: No evidence of acute abnormality, such as acute infarction, hemorrhage,  hydrocephalus, or mass lesion/mass effect.  CT CERVICAL SPINE FINDINGS  Negative for acute fracture or subluxation. No prevertebral edema. No gross cervical canal hematoma. Focally advanced degenerative disc disease at C5-6 and C6-7 with marked disc narrowing, sclerosis, and endplate spurring. There is multilevel facet osteoarthritis, likely accounting for trace C4-5 anterolisthesis.  IMPRESSION: 1. No acute intracranial abnormality.  No seizure focus identified. 2. Right parietal scalp contusion and laceration. No associated fracture. 3. No evidence of acute cervical spine injury.   Electronically Signed   By: Tiburcio PeaJonathan  Watts M.D.   On: 08/24/2013 23:22   Dg Chest Port 1 View  08/24/2013   CLINICAL DATA:  Altered mental status after fall and seizure  EXAM: PORTABLE CHEST - 1 VIEW  COMPARISON:  09/11/2011  FINDINGS: There is a dual-chamber right approach ICD/ pacer. Two right ventricular leads are present. Lead orientation is unchanged. Mild cardiomegaly which is stable. Previous median sternotomy for CABG. No edema, consolidation, effusion, or pneumothorax.  IMPRESSION: No active disease.   Electronically Signed   By: Tiburcio PeaJonathan  Watts M.D.   On: 08/24/2013 22:46    Medications:  I have reviewed the patient's current medications. Scheduled: . aspirin  325 mg Oral Daily  . atorvastatin  80 mg Oral Daily  . bacitracin  1 application Topical BID  . citalopram  20 mg Oral Daily  . furosemide  40 mg Oral q morning - 10a  . heparin  5,000 Units Subcutaneous 3 times per day  . metoprolol  5 mg Intravenous Once  . metoprolol tartrate  75 mg Oral BID  . pantoprazole  40 mg Oral Daily  . sodium chloride  3 mL Intravenous Q12H    Assessment/Plan: Patient without further seizures.  Seizure likely provoked and related to new OTC medications and sleep deprivation.  EEG unremarkable.  CT unremarkable.  MRI unable to be performed.    Recommendations: 1.  AED's not indicated at this time.  2.  No further  neurological work up recommended at this time 3.  Patient unable to drive, operate heavy machinery, perform activities at heights and participate in water activities until release by outpatient physician.   LOS: 2 days   Thana Farr, MD [facility] [phone] 08/26/2013  5:48 PM

## 2013-08-26 NOTE — Progress Notes (Signed)
[place] TEAM 1 - Stepdown/ICU TEAM Progress Note  RAIDEN HAYDU WUJ:[id number] DOB: [birth year] DOA: 08/24/2013 PCP: Pamelia Hoit, MD  Admit HPI / Brief Narrative: Douglas SCHOOLS is a 58 y.o.  PMHx Substance Abuse,  male with Past medical history of hypertension, CAD, ischemic cardiomyopathy, ICD, morbid obesity.  Patient presented with complaints of seizures. He mentions that since last several nights he has not been able to sleep due to severe back pain. Along with that he also has been having intermittent nausea and loose bowel motions since last few days. He has been taking Percocet for pain without any significant benefit has been lying down in the bed for the most part.  Today while he was out in the porch he started having seizure like activity and fell on the porch. EMS was called and patient was alert and oriented. CBC was normal. He had another seizure episode while he was at the EMS.  Patient was unable to provide any history related to this event as he does not remember the event. No further events while here in the hospital no confusion. He mentions he has unsteady gait since last few days. Does not complain of any focal neurological deficit.  As the patient's urine toxicology screen was positive for cannabinoids on further questioning he mentions that he was at his friend's house and his friends were using it, and he might have been possibly because of second hand smoking and he denies using any drugs.  The patient is coming from home. And at his baseline independent for most of his ADL. 6/30 patient admits that he smokes marijuana at a party, negative CP/SOB, positive low back pain  HPI/Subjective: 6/30 patient admits that he smokes marijuana at a party, negative CP/SOB, positive low back pain  Assessment/Plan: Seizure  -Most likely secondary to patient's using marijuana -Patient counseled extensively concerning for sequela of continuing to use illicit drugs to include  increased morbidity and mortality - ICD interrogation was done which was normal there was no shock.  -Per neurology will hold all antibiotic medication  -EEG pending  -Continue Seizure prophylaxis.   A. fib RVR -Increase metoprolol 75 mg BID, if uncontrolled on increased metoprolol will need to consider Cardizem or amiodarone -DDX include pericarditis (unlikely), ischemic given patient's significant cardiac history will obtain echocardiogram, patient only mildly anemic (unlikely), hypo-/hyperthyroidism obtain TSH, PE (unlikely patient asymptomatic), sepsis on admission patient had a leukocytosis with mild left shift will obtain blood culture, urine culture -For now will maintain patient on heparin TID, if patient continues with A. fib will need to consider anticoagulation -7/1 start Cardizem 60 mg BID -Discussed with patient starting Coumadin vs Xarelto   Leukocytosis  -Trending down, afebrile negative left shift continue to monitor  Hypertension -Within AHA guideline see A. fib RVR  CHRONIC SYSTOLIC CHF -See echocardiogram  -See A. fib RVR  Back pain  -Patient appears to have sciatica  -We will get x-ray lumbar spine,  -continue with Flexeril hold Zanaflex continue with Percocet. - PT/OT; recommends home health  L-spine DJD -See back pain     Code Status: FULL Family Communication: no family present at time of exam Disposition Plan: Per neurology    Consultants: Dr. Ritta Slot (neurology)   Procedure/Significant Events: 6/29 PCXR; No Active Dz 6/29 CT Head w/o contrast; No acute intracranial abnormality. No seizure focus identified.  - Right parietal scalp contusion and laceration. No evidence of acute cervical spine injury. 6/30 x-ray L. spine;No acute osseous abnormality  identified; Stable severe L5-S1 disc and endplate degeneration. 6/30 EEG; normal EEG recorded during wakefulness and during sleep. No evidence epileptic disorder 7/1 Echocardiogram -  Left ventricle: Septum is mildly dyskinetic. Cavity moderately dilated. Wall thickness increased in pattern of mild LVH.  -LVEF=20%. Diffuse hypokinesis. - Left atrium: moderately dilated. - Right ventricle: mildly dilated.  - Right atrium:  mildly dilated. - Impressions:  pacemaker wire in the RV (not mentioned in the history).     Culture 6/30 MRSA by PCR Positive    Antibiotics: NA   DVT prophylaxis: Heparin SQ   Devices NA   LINES / TUBES:  6/29 20ga right hand    Continuous Infusions:   Objective: VITAL SIGNS: Temp: 98.5 F (36.9 C) (07/01 2001) Temp src: Oral (07/01 2001) BP: 107/55 mmHg (07/01 2001) Pulse Rate: 104 (07/01 2001) SPO2; 98% on room air FIO2:   Intake/Output Summary (Last 24 hours) at 08/26/13 2130 Last data filed at 08/26/13 2014  Gross per 24 hour  Intake    240 ml  Output    600 ml  Net   -360 ml     Exam: General: A./O. x4, NAD, No acute respiratory distress Lungs: Clear to auscultation bilaterally without wheezes or crackles Cardiovascular: Irregular irregular rhythm and rate, negative murmurs rubs or gallops, normal  S1 and S2 Abdomen: Morbidly obese, Nontender, nondistended, soft, bowel sounds positive, no rebound, no ascites, no appreciable mass Extremities: No significant cyanosis, clubbing, or edema bilateral lower extremities  Data Reviewed: Basic Metabolic Panel:  Recent Labs Lab 08/24/13 2246 08/24/13 2258 08/25/13 0104 08/26/13 1830  NA 137 137  --  138  K 3.8 4.1  --  4.0  CL 105 95*  --  99  CO2  --  18*  --  28  GLUCOSE 200* 201*  --  94  BUN 20 21  --  12  CREATININE 1.10 1.04  --  0.73  CALCIUM  --  8.6  --  8.7  MG  --   --  1.8 1.6   Liver Function Tests:  Recent Labs Lab 08/26/13 1830  AST 31  ALT 33  ALKPHOS 93  BILITOT 0.3  PROT 7.2  ALBUMIN 2.7*   No results found for this basename: LIPASE, AMYLASE,  in the last 168 hours No results found for this basename: AMMONIA,  in the last 168  hours CBC:  Recent Labs Lab 08/24/13 2246 08/24/13 2258 08/25/13 0600 08/26/13 1830  WBC  --  16.9* 19.3* 14.5*  NEUTROABS  --  13.0* 14.1* 9.4*  HGB 14.3 12.5* 12.9* 12.8*  HCT 42.0 38.3* 39.0 39.0  MCV  --  92.5 92.6 93.8  PLT  --  261 274 317   Cardiac Enzymes:  Recent Labs Lab 08/24/13 2259  TROPONINI <0.30   BNP (last 3 results)  Recent Labs  08/24/13 2259  PROBNP 2056.0*   CBG:  Recent Labs Lab 08/24/13 2213 08/25/13 1149  GLUCAP 192* 130*    Recent Results (from the past 240 hour(s))  URINE CULTURE     Status: None   Collection Time    08/25/13  3:45 AM      Result Value Ref Range Status   Specimen Description URINE, CATHETERIZED   Final   Special Requests A   Final   Culture  Setup Time     Final   Value: 08/25/2013 12:45     [facility]   Colony Count PENDING   Incomplete  Culture     Final   Value: Culture reincubated for better growth     [facility] at [facility]   Report Status PENDING   Incomplete  MRSA PCR SCREENING     Status: Abnormal   Collection Time    08/25/13  6:30 AM      Result Value Ref Range Status   MRSA by PCR POSITIVE (*) NEGATIVE Final   Comment:            The GeneXpert MRSA Assay (FDA     approved for NASAL specimens     only), is one component of a     comprehensive MRSA colonization     surveillance program. It is not     intended to diagnose MRSA     infection nor to guide or     monitor treatment for     MRSA infections.     RESULT CALLED TO, READ BACK BY AND VERIFIED WITH:     T.DAVIS,RN 08/25/13 0921 BY BSLADE  CULTURE, BLOOD (ROUTINE X 2)     Status: None   Collection Time    08/25/13  9:00 PM      Result Value Ref Range Status   Specimen Description BLOOD LEFT ARM   Final   Special Requests BOTTLES DRAWN AEROBIC ONLY 10CC   Final   Culture PENDING   Incomplete   Report Status PENDING   Incomplete  CULTURE, BLOOD (ROUTINE X 2)     Status: None   Collection Time     08/25/13  9:05 PM      Result Value Ref Range Status   Specimen Description BLOOD LEFT HAND   Final   Special Requests BOTTLES DRAWN AEROBIC ONLY 4CC   Final   Culture PENDING   Incomplete   Report Status PENDING   Incomplete     Studies:  Recent x-ray studies have been reviewed in detail by the Attending Physician  Scheduled Meds:  Scheduled Meds: . aspirin  325 mg Oral Daily  . atorvastatin  80 mg Oral Daily  . bacitracin  1 application Topical BID  . citalopram  20 mg Oral Daily  . diltiazem  60 mg Oral Q12H  . furosemide  40 mg Oral q morning - 10a  . heparin  5,000 Units Subcutaneous 3 times per day  . metoprolol  5 mg Intravenous Once  . metoprolol tartrate  75 mg Oral BID  . pantoprazole  40 mg Oral Daily  . sodium chloride  3 mL Intravenous Q12H    Time spent on care of this patient: 40 mins   Drema DallasWOODS, CURTIS, J , MD   [facility] Office  [phone] Pager [phone]  On-Call/Text Page:      [email or web address]      password [email or web address]  If 7PM-7AM, please contact night-coverage [email or web address] Password [email or web address] 08/26/2013, 9:30 PM   LOS: 2 days

## 2013-08-26 NOTE — Progress Notes (Signed)
Occupational Therapy Treatment Patient Details Name: Douglas PaganiniRobert G [name] MRN: [id number] DOB: 05/27/1955 Today's Date: 08/26/2013    History of present illness 58 y.o. male [facility] with seizure, fall, posterior head lac.  Neuro involved and seizure workup pending.  Pt with significant PMHx of ischemic cardiomyopathy, CHF, CAD, s/p ICD, HTN, MI, CABG (4 vessel).     OT comments  Pt seen for Rt. Shoulder pain and function.  Pt complaint of pain Rt. Shoulder 5/10.  Pt reports old injury to shoulder in 2/15 resulting in decreased use/ROM Rt. Shoulder, but now reports he fell on shoulder at time of seizure resulting in increased pain, and further reduced use.  Pt tends to overuse shoulder elevators and internal rotators to achieve shoulder elevation.  Neuromuscular reeducation performed to facilitate more normalized movement patterns followed by strengthening activities.  Pt able to reach and retrieve items at ~70 shoulder flexion and scaption and HEP was updated.  Pt would likely benefit from orthopedic consult for Rt. Shoulder   Follow Up Recommendations  Home health OT    Equipment Recommendations  3 in 1 bedside comode    Recommendations for Other Services      Precautions / Restrictions Precautions Precautions: Fall Precaution Comments: h/o falls       Mobility Bed Mobility                  Transfers                      Balance                                   ADL                                         General ADL Comments: Pt with complaint of pain posterior shoulder, top of shoulder and radiating down into Rt. bicep area with stiffness of Rt. elbow.  Pt unable to reach forward with Rt. UE actively at beginning of session.  worked on [employer] Rt. UE with focus on keeping elbow extended with UE in neutral rotation (thumb up) to reduce overuse of pecs and shoulder elevators.  Pt able to perform isometric holding at  ~90-100* flexion.  Then worked on eccentric release of 20-50* with concentric contraction to ~110*.  Repeated this x 5 .  Pt then able to perform AROM full shoulder flexion with max effort and encouragement x 4 - he does tend to elevate shoulder/scap during movement.    Pt able to reach and retrieve items at ~70* flexion and scaption.  Worked on active reach with appropriate trunk excursion (facillitation provided).        Vision                     Perception     Praxis      Cognition   Behavior During Therapy: WFL for tasks assessed/performed Overall Cognitive Status: Within Functional Limits for tasks assessed                       Extremity/Trunk Assessment               Exercises Other Exercises Other Exercises: Pt instructed to place palms of hands together in "pray  hand" position, then perform bil. shoulder flexion.  Pt performed x 10.  He was able to achieve full AROM with subjective decrease in pain.  Pt instructed to perform x 10 reps 4-6x/day   Shoulder Instructions       General Comments      Pertinent Vitals/ Pain       See vitals flow sheet.   Home Living                                          Prior Functioning/Environment              Frequency Min 2X/week     Progress Toward Goals  OT Goals(current goals can now be found in the care plan section)  Progress towards OT goals: Progressing toward goals  Acute Rehab OT Goals Patient Stated Goal: to return home and walk dog OT Goal Formulation: With patient Time For Goal Achievement: 09/08/13 Potential to Achieve Goals: Good ADL Goals Pt Will Perform Tub/Shower Transfer: Tub transfer;with min guard assist;shower seat Pt/caregiver will Perform Home Exercise Program: Right Upper extremity;Independently;With written HEP provided Additional ADL Goal #1: Pt will complete bed mobility mod I with HOB flat and no rails Additional ADL Goal #2: Pt will reach and  retrieve objects at ~110 degrees consistently using Rt. UE with good alignment of shoulder   Plan Discharge plan remains appropriate    Co-evaluation                 End of Session     Activity Tolerance Patient tolerated treatment well   Patient Left in chair;with call bell/phone within reach   Nurse Communication          Time: [id number] OT Time Calculation (min): 32 min  Charges: OT General Charges $OT Visit: 1 Procedure OT Treatments $Neuromuscular Re-education: 23-37 mins  Maizey Menendez M 08/26/2013, 4:18 PM

## 2013-08-27 DIAGNOSIS — I472 Ventricular tachycardia: Secondary | ICD-10-CM

## 2013-08-27 DIAGNOSIS — I209 Angina pectoris, unspecified: Secondary | ICD-10-CM

## 2013-08-27 DIAGNOSIS — I2510 Atherosclerotic heart disease of native coronary artery without angina pectoris: Secondary | ICD-10-CM

## 2013-08-27 DIAGNOSIS — I4729 Other ventricular tachycardia: Secondary | ICD-10-CM

## 2013-08-27 LAB — COMPREHENSIVE METABOLIC PANEL
ALK PHOS: 85 U/L (ref 39–117)
ALT: 30 U/L (ref 0–53)
AST: 37 U/L (ref 0–37)
Albumin: 2.5 g/dL — ABNORMAL LOW (ref 3.5–5.2)
Anion gap: 11 (ref 5–15)
BUN: 11 mg/dL (ref 6–23)
CALCIUM: 8.6 mg/dL (ref 8.4–10.5)
CO2: 29 mEq/L (ref 19–32)
Chloride: 99 mEq/L (ref 96–112)
Creatinine, Ser: 0.8 mg/dL (ref 0.50–1.35)
GFR calc Af Amer: >90 mL/min (ref >90)
Glucose, Bld: 100 mg/dL — ABNORMAL HIGH (ref 70–99)
POTASSIUM: 5.7 meq/L — AB (ref 3.7–5.3)
SODIUM: 139 meq/L (ref 137–147)
TOTAL PROTEIN: 7 g/dL (ref 6.0–8.3)
Total Bilirubin: 0.3 mg/dL (ref 0.3–1.2)

## 2013-08-27 LAB — CBC WITH DIFFERENTIAL/PLATELET
BASOS ABS: 0 10*3/uL (ref 0.0–0.1)
BASOS PCT: 0 % (ref 0–1)
EOS ABS: 0.3 10*3/uL (ref 0.0–0.7)
Eosinophils Relative: 2 % (ref 0–5)
HCT: 37.9 % — ABNORMAL LOW (ref 39.0–52.0)
HEMOGLOBIN: 12.5 g/dL — AB (ref 13.0–17.0)
Lymphocytes Relative: 28 % (ref 12–46)
Lymphs Abs: 4 10*3/uL (ref 0.7–4.0)
MCH: 31.1 pg (ref 26.0–34.0)
MCHC: 33 g/dL (ref 30.0–36.0)
MCV: 94.3 fL (ref 78.0–100.0)
Monocytes Absolute: 1.2 10*3/uL — ABNORMAL HIGH (ref 0.1–1.0)
Monocytes Relative: 9 % (ref 3–12)
NEUTROS ABS: 8.6 10*3/uL — AB (ref 1.7–7.7)
Neutrophils Relative %: 61 % (ref 43–77)
PLATELETS: 346 10*3/uL (ref 150–400)
RBC: 4.02 MIL/uL — ABNORMAL LOW (ref 4.22–5.81)
RDW: 13.9 % (ref 11.5–15.5)
WBC: 14.1 10*3/uL — ABNORMAL HIGH (ref 4.0–10.5)

## 2013-08-27 LAB — URINE CULTURE: Colony Count: 9000

## 2013-08-27 LAB — MAGNESIUM: MAGNESIUM: 1.9 mg/dL (ref 1.5–2.5)

## 2013-08-27 MED ORDER — FUROSEMIDE 40 MG PO TABS: 20.0000 mg | ORAL_TABLET | Freq: Every day | ORAL | Status: DC

## 2013-08-27 MED ORDER — RIVAROXABAN 20 MG PO TABS: 20.0000 mg | ORAL_TABLET | Freq: Every day | ORAL | Status: DC

## 2013-08-27 MED ORDER — DILTIAZEM HCL ER 60 MG PO CP12: 60.0000 mg | ORAL_CAPSULE | Freq: Two times a day (BID) | ORAL | Status: DC

## 2013-08-27 MED ORDER — METOPROLOL TARTRATE 12.5 MG HALF TABLET
62.5000 mg | ORAL_TABLET | Freq: Two times a day (BID) | ORAL | Status: DC
  Filled 2013-08-27 (×2): qty 1

## 2013-08-27 MED ORDER — RIVAROXABAN 20 MG PO TABS
20.0000 mg | ORAL_TABLET | Freq: Every day | ORAL | Status: DC
  Administered 2013-08-27: 20 mg via ORAL
  Filled 2013-08-27: qty 1

## 2013-08-27 MED ORDER — METOPROLOL TARTRATE 25 MG PO TABS: 75.0000 mg | ORAL_TABLET | Freq: Two times a day (BID) | ORAL | Status: DC

## 2013-08-27 MED ORDER — FUROSEMIDE 20 MG PO TABS
20.0000 mg | ORAL_TABLET | Freq: Every morning | ORAL | Status: DC
  Administered 2013-08-27: 20 mg via ORAL
  Filled 2013-08-27: qty 1

## 2013-08-27 MED ORDER — METOPROLOL TARTRATE 50 MG PO TABS
75.0000 mg | ORAL_TABLET | Freq: Two times a day (BID) | ORAL | Status: DC
  Administered 2013-08-27: 75 mg via ORAL
  Filled 2013-08-27 (×2): qty 1

## 2013-08-27 NOTE — Care Management Note (Signed)
    Page 1 of 1   08/27/2013     11:20:41 AM CARE MANAGEMENT NOTE 08/27/2013  Patient:  Douglas [name],Douglas [name]   Account Number:  [id number]  Date Initiated:  08/27/2013  Documentation initiated by:  GRAVES-BIGELOW,Mckenleigh Tarlton  Subjective/Objective Assessment:   Pt admitted for seizure and fall. Plan for home on xarelto. Co pay for xarelto is $6.60. CM will provide pt with a 30 day free card. No further needs from CM at this time.     Action/Plan:   30 day free card given to pt and IM given to pt.   Anticipated DC Date:  08/27/2013   Anticipated DC Plan:  HOME/SELF CARE      DC Planning Services  CM consult      Choice offered to / List presented to:             Status of service:  Completed, signed off Medicare Important Message given?  YES (If response is "NO", the following Medicare IM given date fields will be blank) Date Medicare IM given:  08/27/2013 Medicare IM given by:  GRAVES-BIGELOW,Javia Dillow Date Additional Medicare IM given:   Additional Medicare IM given by:    Discharge Disposition:  HOME/SELF CARE  Per UR Regulation:  Reviewed for med. necessity/level of care/duration of stay  If discussed at Long Length of Stay Meetings, dates discussed:    Comments:

## 2013-08-27 NOTE — Progress Notes (Signed)
Physical Therapy Treatment Patient Details Name: Douglas PaganiniRobert G [name] MRN: [id number] DOB: 09/11/1955 Today's Date: 08/27/2013    History of Present Illness 58 y.o. male [facility] with seizure, fall, posterior head lac.  Neuro involved and seizure workup pending.  Pt with significant PMHx of ischemic cardiomyopathy, CHF, CAD, s/p ICD, HTN, MI, CABG (4 vessel).      PT Comments    Pt with much improved balance and mobility.  Pt's O2 sats remained 93-94% on RA throughout amb.  Pt did not c/o any dizziness upon coming to standing or during mobility.  Discussed continued amb after D/C at school track near his home.  Pt eager to return home and improve mobility.  Will continue to follow while on acute.    Follow Up Recommendations  Home health PT     Equipment Recommendations  None recommended by PT    Recommendations for Other Services       Precautions / Restrictions Precautions Precautions: Fall Precaution Comments: h/o falls Restrictions Weight Bearing Restrictions: No    Mobility  Bed Mobility                  Transfers Overall transfer level: Modified independent Equipment used: None Transfers: Sit to/from Stand              Ambulation/Gait Ambulation/Gait assistance: Modified independent (Device/Increase time) Ambulation Distance (Feet): 400 Feet Assistive device: None Gait Pattern/deviations: Step-through pattern;Decreased stride length     General Gait Details: pt much improved with balance and mobility today.  pt able to amb without AD, performing head turns, changes in speed, and negotiating around obstacles.     Stairs            Wheelchair Mobility    Modified Rankin (Stroke Patients Only)       Balance Overall balance assessment: Needs assistance         Standing balance support: No upper extremity supported Standing balance-Leahy Scale: Good                      Cognition Arousal/Alertness:  Awake/alert Behavior During Therapy: WFL for tasks assessed/performed Overall Cognitive Status: Within Functional Limits for tasks assessed                      Exercises      General Comments        Pertinent Vitals/Pain Denied pain.      Home Living                      Prior Function            PT Goals (current goals can now be found in the care plan section) Acute Rehab PT Goals Patient Stated Goal: to return home and walk dog Time For Goal Achievement: 09/08/13 Potential to Achieve Goals: Good Progress towards PT goals: Progressing toward goals    Frequency  Min 3X/week    PT Plan Current plan remains appropriate    Co-evaluation             End of Session Equipment Utilized During Treatment: Gait belt Activity Tolerance: Patient tolerated treatment well Patient left:  (up with OT.)     Time: [id number] PT Time Calculation (min): 14 min  Charges:  $Gait Training: 8-22 mins                    G Codes:  Sunny SchleinRitenour, Iylah Dworkin F, [place]PT [phone] 08/27/2013, 10:25 AM

## 2013-08-27 NOTE — Discharge Instructions (Signed)

## 2013-08-27 NOTE — Progress Notes (Signed)
Co pay for xarelto is $6.60. CM will provide pt with a 30 day free card. No further needs from CM at this time. Gala LewandowskyGraves-Bigelow, Sahar Ryback Kaye, RN,BSN [phone]

## 2013-08-27 NOTE — Discharge Summary (Signed)
Physician Discharge Summary  Douglas PaganiniRobert G [name] YNW:[id number] DOB: 04/25/1955 DOA: 08/24/2013  PCP: Pamelia HoitWILSON,FRED HENRY, MD  Admit date: 08/24/2013 Discharge date: 08/27/2013  Time spent: 40 minutes Recommendations for Outpatient Follow-up:  Seizure  -Most likely secondary to patient's using marijuana  -Patient counseled extensively concerning for sequela of continuing to use illicit drugs to include increased morbidity and mortality  - ICD interrogation was done which was normal there was no shock.  -EEG; no findings of seizure activity see results below  -Patient safe for discharge, with followup with neurologist in 2 weeks Dr. Ritta SlotMcNeill Kirkpatrick (neurology) -Patient has been counseled by neurology he is  unable to drive, operate heavy machinery, perform activities at heights and participate in water activities until release by outpatient physician.   A. fib RVR  -Continue metoprolol 75 mg BID, -Continue Cardizem 60 mg daily  - Start  Xarleto  20 mg daily with supper -TSH/free T4 within normal limits, on echocardiogram no sign of pericarditis, patient only mildly anemic, patient not septic. -Patient stable for discharge -Patient followup with Dr. Lewayne BuntingGregg Taylor (cardiology)  Leukocytosis  -Trending down, afebrile negative left shift most likely secondary to patient's seizure activity (the margination)  -PCP to follow    Hypertension  -Within AHA guideline see A. fib RVR   ISCHEMIC CARDIOMYOPATHY   -Echocardiogram; see results will -Will control patient's A. fib RVR, HTN - Continue patient's Lipitor 80 mg daily  HLD -Currently within AHA guideline -Continue patient's to 80 mg daily  Back pain  -Patient appears to have sciatica  -x-ray lumbar spine,; shows DJD see results below   -continue with Flexeril hold Zanaflex continue with Percocet.  - PCP to manage patient's pain medication.  Substance abuse -Spoke with patient at length concerning for sequela of combining alcohol and  illicit drugs. Counseled that considering his comorbidities of heart failure, A. Fib RVR he was at significant increase for sudden death.     Discharge Diagnoses:  Principal Problem:   Seizure Active Problems:   Essential hypertension, benign   CARDIOMYOPATHY, ISCHEMIC   ICD-[employer]   CAD (coronary artery disease)   Atrial fibrillation   Substance abuse   DJD (degenerative joint disease), lumbosacral   Discharge Condition: Stable  Diet recommendation: Heart healthy  Filed Weights   08/25/13 0211 08/25/13 0500 08/27/13 0331  Weight: 119.7 kg (263 lb 14.3 oz) 119.6 kg (263 lb 10.7 oz) 122.1 kg (269 lb 2.9 oz)    History of present illness:    [place] TEAM 1 - Stepdown/ICU TEAM  Progress Note  Douglas [name] [name] DOB: 01/14/1956 DOA: 08/24/2013  PCP: Pamelia HoitWILSON,FRED HENRY, MD  Admit HPI / Brief Narrative:  Douglas PaganiniRobert G [name] is a 58 y.o. PMHx Substance Abuse, male with Past medical history of hypertension, CAD, ischemic cardiomyopathy, ICD, morbid obesity.  Patient presented with complaints of seizures. He mentions that since last several nights he has not been able to sleep due to severe back pain. Along with that he also has been having intermittent nausea and loose bowel motions since last few days. He has been taking Percocet for pain without any significant benefit has been lying down in the bed for the most part.  Today while he was out in the porch he started having seizure like activity and fell on the porch. EMS was called and patient was alert and oriented. CBC was normal. He had another seizure episode while he was at the EMS.  Patient was unable to provide any history related to  this event as he does not remember the event. No further events while here in the hospital no confusion. He mentions he has unsteady gait since last few days. Does not complain of any focal neurological deficit.  As the patient's urine toxicology screen was positive for cannabinoids on  further questioning he mentions that he was at his friend's house and his friends were using it, and he might have been possibly because of second hand smoking and he denies using any drugs.  The patient is coming from home. And at his baseline independent for most of his ADL.  6/30 patient admits that he smokes marijuana at a party, negative CP/SOB, positive low back pain     Hospital Course:  7/2 patient's seizure most likely brought on by combination of smoking marijuana and drinking a party. EEG was negative for seizure activity, and neurology AED's not indicated at this time,No further neurological work up recommended at this time. Patient was also counseled by neurology he is  unable to drive, operate heavy machinery, perform activities at heights and participate in water activities until release by outpatient physician.    Consultants:  Dr. Ritta Slot (neurology)     Procedure/Significant Events:  6/29 PCXR; No Active Dz  6/29 CT Head w/o contrast; No acute intracranial abnormality. No seizure focus identified.  - Right parietal scalp contusion and laceration. No evidence of acute cervical spine injury.  6/30 x-ray L. spine;No acute osseous abnormality identified; Stable severe L5-S1 disc and endplate degeneration.  6/30 EEG; normal EEG recorded during wakefulness and during sleep. No evidence epileptic disorder  7/1 Echocardiogram  - Left ventricle: Septum is mildly dyskinetic. Cavity moderately dilated. Wall thickness increased in pattern of mild LVH.  -LVEF=20%. Diffuse hypokinesis.  - Left atrium: moderately dilated. - Right ventricle: mildly dilated.  - Right atrium: mildly dilated. - Impressions: pacemaker wire in the RV (not mentioned in the history).     Culture  6/30 MRSA by PCR Positive 6/30 left arm, left hand NGTD 6/30 urine negative    Antibiotics NA    Discharge Exam: Filed Vitals:   08/27/13 0807 08/27/13 0839 08/27/13 0841 08/27/13 0848  BP:  90/65 71/58 98/66  116/101  Pulse:      Temp: 97.8 F (36.6 C)     TempSrc: Oral     Resp:   19 15  Height:      Weight:      SpO2:       General: A./O. x4, NAD, No acute respiratory distress  Lungs: Clear to auscultation bilaterally without wheezes or crackles  Cardiovascular: Irregular irregular rhythm and rate, negative murmurs rubs or gallops, normal S1 and S2  Abdomen: Morbidly obese, Nontender, nondistended, soft, bowel sounds positive, no rebound, no ascites, no appreciable mass  Extremities: No significant cyanosis, clubbing, or edema bilateral lower extremities  Discharge Instructions     Medication List    STOP taking these medications       lisinopril 10 MG tablet  Commonly known as:  PRINIVIL,ZESTRIL     metoprolol succinate 50 MG 24 hr tablet  Commonly known as:  TOPROL-XL      TAKE these medications       albuterol 108 (90 BASE) MCG/ACT inhaler  Commonly known as:  PROVENTIL HFA;VENTOLIN HFA  Inhale 2 puffs into the lungs every 6 (six) hours as needed. For wheezing.     ALPRAZolam 1 MG tablet  Commonly known as:  XANAX  Take 1 mg by mouth 3 (three)  times daily as needed. For anxiety.     aspirin 325 MG tablet  Take 325 mg by mouth daily.     atorvastatin 80 MG tablet  Commonly known as:  LIPITOR  Take 80 mg by mouth daily.     citalopram 20 MG tablet  Commonly known as:  CELEXA  Take 20 mg by mouth daily.     cyclobenzaprine 10 MG tablet  Commonly known as:  FLEXERIL  Take 10 mg by mouth daily.     diltiazem 60 MG 12 hr capsule  Commonly known as:  CARDIZEM SR  Take 1 capsule (60 mg total) by mouth every 12 (twelve) hours.     fexofenadine 180 MG tablet  Commonly known as:  ALLEGRA  Take 180 mg by mouth daily.     Fish Oil 1200 MG Caps  Take 1,200 mg by mouth daily.     Fluticasone-Salmeterol 250-50 MCG/DOSE Aepb  Commonly known as:  ADVAIR  Inhale 1 puff into the lungs 2 (two) times daily.     furosemide 40 MG tablet  Commonly  known as:  LASIX  Take 0.5 tablets (20 mg total) by mouth daily.     lansoprazole 30 MG capsule  Commonly known as:  PREVACID  Take 30 mg by mouth daily.     meloxicam 15 MG tablet  Commonly known as:  MOBIC  Take 15 mg by mouth daily.     metoprolol tartrate 25 MG tablet  Commonly known as:  LOPRESSOR  Take 3 tablets (75 mg total) by mouth 2 (two) times daily.     multivitamin with minerals Tabs tablet  Take 1 tablet by mouth daily.     oxyCODONE-acetaminophen 10-325 MG per tablet  Commonly known as:  PERCOCET  Take 1 tablet by mouth every 6 (six) hours as needed for pain.     rivaroxaban 20 MG Tabs tablet  Commonly known as:  XARELTO  Take 1 tablet (20 mg total) by mouth daily with supper.     tiZANidine 4 MG tablet  Commonly known as:  ZANAFLEX  Take 4 mg by mouth 2 (two) times daily.     traZODone 50 MG tablet  Commonly known as:  DESYREL  Take 50 mg by mouth at bedtime.       Allergies  Allergen Reactions  . Codeine Hives and Itching  . Vioxx [Rofecoxib] Palpitations    "Caused massive heart attack."   Follow-up Information   Follow up with Pamelia Hoit, MD. Schedule an appointment as soon as possible for a visit in 1 week. [facility] followup; cardiomyopathy, new onset seizure, new onset A. fib RVR)    Specialty:  Family Medicine   Contact information:   [address] [place] [address] [phone]       Follow up with [name], MD. Schedule an appointment as soon as possible for a visit in 1 week. [facility] followup; cardiomyopathy, new onset A. fib RVR titrate medication to effect.)    Specialty:  Cardiology   Contact information:   [address] [place] [address] [phone]       Follow up with [facility]. Schedule an appointment as soon as possible for a visit in 1 month. [facility] followup; new onset seizure most likely caused by alcohol plus marijuana.)    Contact information:   [address] [place] [address] [phone]       The results of significant diagnostics from  this hospitalization (including imaging, microbiology, ancillary and laboratory) are listed below for reference.    Significant Diagnostic Studies: Dg Lumbar Spine 2-3 Views  08/25/2013   CLINICAL DATA:  58 year old male with low back pain, sciatica. Initial encounter. Reports prior surgery.  EXAM: LUMBAR SPINE - 2-3 VIEW  COMPARISON:  Lumbar spine CT 02/02/2011.  FINDINGS: Same numbering system as on the comparison, designating chronic vacuum disc phenomena at L5-S1, and full size ribs at T12. Stable vertebral height and alignment. Chronic disc and endplate degeneration at L5-S1 appears stable. Trace anterolisthesis at L4-L5 appears stable. Stable disc spaces. Bone mineralization is within normal limits. Grossly intact visualized lower thoracic levels which demonstrate bulky degenerative endplate spurring. Sacral ala and SI joints within normal limits.  IMPRESSION: No acute osseous abnormality identified in the lumbar spine. Stable severe L5-S1 disc and endplate degeneration.   Electronically Signed   By: Augusto GambleLee  Hall M.D.   On: 08/25/2013 21:11   Ct Head Wo Contrast  08/24/2013   CLINICAL DATA:  Fall.  Seizures.  Altered mental status  EXAM: CT HEAD WITHOUT CONTRAST  CT CERVICAL SPINE WITHOUT CONTRAST  TECHNIQUE: Multidetector CT imaging of the head and cervical spine was performed following the standard protocol without intravenous contrast. Multiplanar CT image reconstructions of the cervical spine were also generated.  COMPARISON:  None.  FINDINGS: CT HEAD FINDINGS  Skull and Sinuses:Subcutaneous gas in the right suboccipital scalp compatible with laceration. No associated fracture. No radiopaque foreign body. The mastoids, middle ears, and paranasal sinuses are clear.  Orbits: No acute abnormality.  Brain: No evidence of acute abnormality, such as acute infarction, hemorrhage, hydrocephalus, or  mass lesion/mass effect.  CT CERVICAL SPINE FINDINGS  Negative for acute fracture or subluxation. No prevertebral edema. No gross cervical canal hematoma. Focally advanced degenerative disc disease at C5-6 and C6-7 with marked disc narrowing, sclerosis, and endplate spurring. There is multilevel facet osteoarthritis, likely accounting for trace C4-5 anterolisthesis.  IMPRESSION: 1. No acute intracranial abnormality.  No seizure focus identified. 2. Right parietal scalp contusion and laceration. No associated fracture. 3. No evidence of acute cervical spine injury.   Electronically Signed   By: Tiburcio PeaJonathan  Watts M.D.   On: 08/24/2013 23:22   Ct Cervical Spine Wo Contrast  08/24/2013   CLINICAL DATA:  Fall.  Seizures.  Altered mental status  EXAM: CT HEAD WITHOUT CONTRAST  CT CERVICAL SPINE WITHOUT CONTRAST  TECHNIQUE: Multidetector CT imaging of the head and cervical spine was performed following the standard protocol without intravenous contrast. Multiplanar CT image reconstructions of the cervical spine were also generated.  COMPARISON:  None.  FINDINGS: CT HEAD FINDINGS  Skull and Sinuses:Subcutaneous gas in the right suboccipital scalp compatible with laceration. No associated fracture. No radiopaque foreign body. The mastoids, middle ears, and paranasal sinuses are clear.  Orbits: No acute abnormality.  Brain: No evidence of acute abnormality, such as acute infarction, hemorrhage, hydrocephalus, or mass lesion/mass effect.  CT CERVICAL SPINE FINDINGS  Negative for acute fracture or subluxation. No prevertebral edema. No gross cervical canal hematoma. Focally advanced degenerative disc disease at C5-6 and C6-7 with marked disc narrowing, sclerosis, and endplate spurring. There is multilevel facet osteoarthritis, likely accounting for trace C4-5 anterolisthesis.  IMPRESSION: 1. No acute intracranial abnormality.  No seizure focus identified. 2. Right parietal scalp contusion and laceration. No associated  fracture. 3. No evidence of acute cervical spine injury.   Electronically Signed   By: Tiburcio PeaJonathan  Watts M.D.   On: 08/24/2013 23:22  Dg Chest Port 1 View  08/24/2013   CLINICAL DATA:  Altered mental status after fall and seizure  EXAM: PORTABLE CHEST - 1 VIEW  COMPARISON:  09/11/2011  FINDINGS: There is a dual-chamber right approach ICD/ pacer. Two right ventricular leads are present. Lead orientation is unchanged. Mild cardiomegaly which is stable. Previous median sternotomy for CABG. No edema, consolidation, effusion, or pneumothorax.  IMPRESSION: No active disease.   Electronically Signed   By: Tiburcio Pea M.D.   On: 08/24/2013 22:46    Microbiology: Recent Results (from the past 240 hour(s))  URINE CULTURE     Status: None   Collection Time    08/25/13  3:45 AM      Result Value Ref Range Status   Specimen Description URINE, CATHETERIZED   Final   Special Requests A   Final   Culture  Setup Time     Final   Value: 08/25/2013 12:45     Performed at [facility] Count     Final   Value: 10,000 COLONIES/ML     Performed at [facility]   Culture     Final   Value: Culture reincubated for better growth     Performed at [facility]   Report Status PENDING   Incomplete  MRSA PCR SCREENING     Status: Abnormal   Collection Time    08/25/13  6:30 AM      Result Value Ref Range Status   MRSA by PCR POSITIVE (*) NEGATIVE Final   Comment:            The GeneXpert MRSA Assay (FDA     approved for NASAL specimens     only), is one component of a     comprehensive MRSA colonization     surveillance program. It is not     intended to diagnose MRSA     infection nor to guide or     monitor treatment for     MRSA infections.     RESULT CALLED TO, READ BACK BY AND VERIFIED WITH:     T.DAVIS,RN 08/25/13 0921 BY BSLADE  CULTURE, BLOOD (ROUTINE X 2)     Status: None   Collection Time    08/25/13  9:00 PM      Result Value Ref Range Status   Specimen  Description BLOOD LEFT ARM   Final   Special Requests BOTTLES DRAWN AEROBIC ONLY 10CC   Final   Culture  Setup Time     Final   Value: 08/26/2013 04:52     Performed at [facility]   Culture     Final   Value:        BLOOD CULTURE RECEIVED NO GROWTH TO DATE CULTURE WILL BE HELD FOR 5 DAYS BEFORE ISSUING A FINAL NEGATIVE REPORT     Performed at [facility]   Report Status PENDING   Incomplete  CULTURE, BLOOD (ROUTINE X 2)     Status: None   Collection Time    08/25/13  9:05 PM      Result Value Ref Range Status   Specimen Description BLOOD LEFT HAND   Final   Special Requests BOTTLES DRAWN AEROBIC ONLY 4CC   Final   Culture  Setup Time     Final   Value: 08/26/2013 03:27     Performed at [facility]   Culture     Final   Value:  BLOOD CULTURE RECEIVED NO GROWTH TO DATE CULTURE WILL BE HELD FOR 5 DAYS BEFORE ISSUING A FINAL NEGATIVE REPORT     Performed at [facility]   Report Status PENDING   Incomplete  URINE CULTURE     Status: None   Collection Time    08/25/13  9:30 PM      Result Value Ref Range Status   Specimen Description URINE, RANDOM   Final   Special Requests NONE   Final   Culture  Setup Time     Final   Value: 08/25/2013 22:40     Performed at [facility] Count     Final   Value: 9,000 COLONIES/ML     Performed at [facility]   Culture     Final   Value: INSIGNIFICANT GROWTH     Performed at [facility]   Report Status 08/27/2013 FINAL   Final     Labs: Basic Metabolic Panel:  Recent Labs Lab 08/24/13 2246 08/24/13 2258 08/25/13 0104 08/26/13 1830 08/27/13 0232  NA 137 137  --  138 139  K 3.8 4.1  --  4.0 5.7*  CL 105 95*  --  99 99  CO2  --  18*  --  28 29  GLUCOSE 200* 201*  --  94 100*  BUN 20 21  --  12 11  CREATININE 1.10 1.04  --  0.73 0.80  CALCIUM  --  8.6  --  8.7 8.6  MG  --   --  1.8 1.6 1.9   Liver Function Tests:  Recent Labs Lab 08/26/13 1830  08/27/13 0232  AST 31 37  ALT 33 30  ALKPHOS 93 85  BILITOT 0.3 0.3  PROT 7.2 7.0  ALBUMIN 2.7* 2.5*   No results found for this basename: LIPASE, AMYLASE,  in the last 168 hours No results found for this basename: AMMONIA,  in the last 168 hours CBC:  Recent Labs Lab 08/24/13 2246 08/24/13 2258 08/25/13 0600 08/26/13 1830 08/27/13 0232  WBC  --  16.9* 19.3* 14.5* 14.1*  NEUTROABS  --  13.0* 14.1* 9.4* 8.6*  HGB 14.3 12.5* 12.9* 12.8* 12.5*  HCT 42.0 38.3* 39.0 39.0 37.9*  MCV  --  92.5 92.6 93.8 94.3  PLT  --  261 274 317 346   Cardiac Enzymes:  Recent Labs Lab 08/24/13 2259  TROPONINI <0.30   BNP: BNP (last 3 results)  Recent Labs  08/24/13 2259  PROBNP 2056.0*   CBG:  Recent Labs Lab 08/24/13 2213 08/25/13 1149  GLUCAP 192* 130*       Signed:  [name], MD [facility] [phone] pager

## 2013-08-27 NOTE — Progress Notes (Addendum)
Orthostatic BP: Lying 98/66  Sitting 90/65 Standing 71/58 Sitting again 116/101 Asymptomatic throughout

## 2013-08-27 NOTE — Progress Notes (Signed)
Pt to d/c home today. Reviewed paperwork, Rx info, had first dose of xarelto.

## 2013-08-27 NOTE — Evaluation (Signed)
Occupational Therapy Evaluation Patient Details Name: Douglas PaganiniRobert G [name] MRN: [id number] DOB: 02/06/1956 Today's Date: 08/27/2013    History of Present Illness 58 y.o. male [facility] with seizure, fall, posterior head lac.  Neuro involved and seizure workup pending.  Pt with significant PMHx of ischemic cardiomyopathy, CHF, CAD, s/p ICD, HTN, MI, CABG (4 vessel).     Clinical Impression   All education is complete and patient indicates understanding. Pt with excellent return demo of AROM RT UE and HEP for home. PT is adequate level for d/c home at this time.    Follow Up Recommendations  Home health OT    Equipment Recommendations  3 in 1 bedside comode    Recommendations for Other Services       Precautions / Restrictions Precautions Precautions: Fall Precaution Comments: h/o falls      Mobility Bed Mobility                  Transfers                      Balance                                            ADL Overall ADL's : Modified independent                                       General ADL Comments: pt completed gathering all adl items and don clothing for home. pt is able to complete 180 degree shoulder movement with RT UE. pt could don shirt usnig RT UE to complete shoulder flexion, external rotation and adduction internal rotation. pt reports "this is better than it has been since february" PT demonstrates worked on [employer] Rt. UE with focus on keeping elbow extended with UE in neutral rotation (thumb up) to reduce overuse of pecs and shoulder elevators. Pt able to perform isometric holding at ~90-100 flexion. Then worked on eccentric release of 20-50 with concentric contraction to ~110*. Repeated this x 5 . Pt then able to perform AROM full shoulder flexion     Vision                     Perception     Praxis      Pertinent Vitals/Pain VSS     Hand Dominance     Extremity/Trunk  Assessment             Communication     Cognition Arousal/Alertness: Awake/alert Behavior During Therapy: WFL for tasks assessed/performed Overall Cognitive Status: Within Functional Limits for tasks assessed                     General Comments       Exercises   Other Exercises Other Exercises: pt return demonstrates all exercises 100% correct   Shoulder Instructions      Home Living                                          Prior Functioning/Environment               OT Diagnosis:  OT Problem List:     OT Treatment/Interventions:      OT Goals(Current goals can be found in the care plan section) Acute Rehab OT Goals Patient Stated Goal: to return home and walk dog OT Goal Formulation: With patient Time For Goal Achievement: 09/08/13 Potential to Achieve Goals: Good ADL Goals Pt Will Perform Tub/Shower Transfer: Tub transfer;with min guard assist;shower seat Pt/caregiver will Perform Home Exercise Program: Right Upper extremity;Independently;With written HEP provided Additional ADL Goal #1: Pt will complete bed mobility mod I with HOB flat and no rails Additional ADL Goal #2: Pt will reach and retrieve objects at ~110 degrees consistently using Rt. UE with good alignment of shoulder   OT Frequency: Min 2X/week   Barriers to D/C:            Co-evaluation              End of Session Nurse Communication: Mobility status;Precautions  Activity Tolerance: Patient tolerated treatment well Patient left: in chair;with call bell/phone within reach   Time: 0928-0952 OT Time Calculation (min): 24 min Charges:  OT General Charges $OT Visit: 1 Procedure OT Treatments $Self Care/Home Management : 8-22 mins $Therapeutic Exercise: 8-22 mins G-Codes:    Douglas [name], Douglas [name] 08/27/2013, 3:22 PM Pager: [phone]

## 2013-08-28 LAB — URINE CULTURE: Colony Count: 10000

## 2013-08-31 ENCOUNTER — Other Ambulatory Visit: Payer: Self-pay

## 2013-08-31 MED FILL — Perflutren Lipid Microsphere IV Susp 1.1 MG/ML: INTRAVENOUS | Qty: 10 | Status: AC

## 2013-09-01 LAB — CULTURE, BLOOD (ROUTINE X 2)
CULTURE: NO GROWTH
Culture: NO GROWTH

## 2013-09-02 ENCOUNTER — Ambulatory Visit (INDEPENDENT_AMBULATORY_CARE_PROVIDER_SITE_OTHER): Payer: Medicare HMO | Admitting: Internal Medicine

## 2013-09-02 ENCOUNTER — Encounter: Payer: Self-pay | Admitting: Internal Medicine

## 2013-09-02 VITALS — BP 130/84 | HR 85 | Ht 67.0 in | Wt 258.0 lb

## 2013-09-02 DIAGNOSIS — F1910 Other psychoactive substance abuse, uncomplicated: Secondary | ICD-10-CM

## 2013-09-02 DIAGNOSIS — I4891 Unspecified atrial fibrillation: Secondary | ICD-10-CM

## 2013-09-02 DIAGNOSIS — I472 Ventricular tachycardia, unspecified: Secondary | ICD-10-CM

## 2013-09-02 DIAGNOSIS — Z95810 Presence of automatic (implantable) cardiac defibrillator: Secondary | ICD-10-CM

## 2013-09-02 DIAGNOSIS — I480 Paroxysmal atrial fibrillation: Secondary | ICD-10-CM

## 2013-09-02 DIAGNOSIS — I2589 Other forms of chronic ischemic heart disease: Secondary | ICD-10-CM

## 2013-09-02 DIAGNOSIS — I5022 Chronic systolic (congestive) heart failure: Secondary | ICD-10-CM

## 2013-09-02 DIAGNOSIS — I4729 Other ventricular tachycardia: Secondary | ICD-10-CM

## 2013-09-02 LAB — MDC_IDC_ENUM_SESS_TYPE_INCLINIC
Battery Voltage: 3.13 V
Brady Statistic AS VP Percent: 0.74 %
Brady Statistic RA Percent Paced: 6.1 %
Date Time Interrogation Session: 20150708100557
HighPow Impedance: 228 Ohm
HighPow Impedance: 456 Ohm
HighPow Impedance: 82 Ohm
Lead Channel Impedance Value: 532 Ohm
Lead Channel Pacing Threshold Amplitude: 1.5 V
Lead Channel Pacing Threshold Pulse Width: 1 ms
Lead Channel Sensing Intrinsic Amplitude: 2.125 mV
Lead Channel Sensing Intrinsic Amplitude: 27.375 mV
Lead Channel Setting Pacing Amplitude: 3 V
Lead Channel Setting Pacing Amplitude: 3 V
Lead Channel Setting Pacing Pulse Width: 0.6 ms
Lead Channel Setting Sensing Sensitivity: 0.3 mV
MDC IDC MSMT LEADCHNL RA SENSING INTR AMPL: 2.8 mV
MDC IDC MSMT LEADCHNL RV IMPEDANCE VALUE: 608 Ohm
MDC IDC MSMT LEADCHNL RV PACING THRESHOLD AMPLITUDE: 0.5 V
MDC IDC MSMT LEADCHNL RV PACING THRESHOLD PULSEWIDTH: 0.4 ms
MDC IDC MSMT LEADCHNL RV SENSING INTR AMPL: 31.625 mV
MDC IDC SET ZONE DETECTION INTERVAL: 270 ms
MDC IDC SET ZONE DETECTION INTERVAL: 300 ms
MDC IDC STAT BRADY AP VP PERCENT: 0.04 %
MDC IDC STAT BRADY AP VS PERCENT: 6.06 %
MDC IDC STAT BRADY AS VS PERCENT: 93.16 %
MDC IDC STAT BRADY RV PERCENT PACED: 0.78 %
Zone Setting Detection Interval: 350 ms
Zone Setting Detection Interval: 450 ms

## 2013-09-02 NOTE — Assessment & Plan Note (Signed)
I considered starting him on anti-arrhythmic drug therapy. Will hold off for now.

## 2013-09-02 NOTE — Progress Notes (Signed)
HPI Douglas [name] returns today for followup. He is a very pleasant 58 year old man with an ischemic cardiomyopathy, chronic systolic heart failure, morbid obesity, status post ICD implantation. He has a history of ventricular tachycardia. He describes a recent episode of syncope but no correlation with any ventricular arrhythmias. He does also have PAF and is systemically anti-coagulated.  He denies chest pain or shortness of breath.  Allergies  Allergen Reactions  . Codeine Hives and Itching  . Vioxx [Rofecoxib] Palpitations    "Caused massive heart attack."     Current Outpatient Prescriptions  Medication Sig Dispense Refill  . albuterol (PROVENTIL HFA;VENTOLIN HFA) 108 (90 BASE) MCG/ACT inhaler Inhale 2 puffs into the lungs every 6 (six) hours as needed. For wheezing.      Marland Kitchen. ALPRAZolam (XANAX) 1 MG tablet Take 1 mg by mouth 3 (three) times daily as needed. For anxiety.      Marland Kitchen. aspirin 325 MG EC tablet Take 325 mg by mouth daily.      Marland Kitchen. atorvastatin (LIPITOR) 80 MG tablet Take 80 mg by mouth daily.      . citalopram (CELEXA) 20 MG tablet Take 20 mg by mouth daily.        . cyclobenzaprine (FLEXERIL) 10 MG tablet Take 10 mg by mouth daily.        Marland Kitchen. diltiazem (CARDIZEM SR) 60 MG 12 hr capsule Take 1 capsule (60 mg total) by mouth every 12 (twelve) hours.  30 capsule  0  . fexofenadine (ALLEGRA) 180 MG tablet Take 180 mg by mouth daily.        . Fluticasone-Salmeterol (ADVAIR) 250-50 MCG/DOSE AEPB Inhale 1 puff into the lungs 2 (two) times daily.      . furosemide (LASIX) 40 MG tablet Take 0.5 tablets (20 mg total) by mouth daily.  30 tablet  0  . lansoprazole (PREVACID) 30 MG capsule Take 30 mg by mouth daily.        . meloxicam (MOBIC) 15 MG tablet Take 15 mg by mouth daily.      . metoprolol (LOPRESSOR) 50 MG tablet Take 50 mg by mouth 2 (two) times daily. 1.5 tabs daily for 75 mg..      . Multiple Vitamin (MULTIVITAMIN WITH MINERALS) TABS Take 1 tablet by mouth daily.      . Omega-3 Fatty  Acids (FISH OIL) 1200 MG CAPS Take 1,200 mg by mouth daily.      Marland Kitchen. oxyCODONE-acetaminophen (PERCOCET) 10-325 MG per tablet Take 1 tablet by mouth every 6 (six) hours as needed for pain.      . rivaroxaban (XARELTO) 20 MG TABS tablet Take 1 tablet (20 mg total) by mouth daily with supper.  30 tablet  0  . tiZANidine (ZANAFLEX) 4 MG tablet Take 4 mg by mouth 2 (two) times daily.       . traZODone (DESYREL) 50 MG tablet Take 50 mg by mouth at bedtime.       No current facility-administered medications for this visit.     Past Medical History  Diagnosis Date  . Hypertension   . Obesity   . Dyslipidemia   . Asthma   . Fatigue   . History of heart attack     times 4  . CAD (coronary artery disease)   . CHF (congestive heart failure)   . Pacemaker   . Myocardial infarction     ROS:   All systems reviewed and negative except as noted in the HPI.   Past Surgical History  Procedure Laterality Date  . Coronary artery bypass graft  2002    times 4  . Cardiac catheterization  1996    stent placement  . Insert / replace / remove pacemaker       Family History  Problem Relation Age of Onset  . Heart attack Father      History   Social History  . Marital Status: Divorced    Spouse Name: N/A    Number of Children: N/A  . Years of Education: N/A   Occupational History  . Not on file.   Social History Main Topics  . Smoking status: Former Smoker    Quit date: 04/23/1989  . Smokeless tobacco: Not on file  . Alcohol Use: No  . Drug Use: Not on file     Comment: marijuana  . Sexual Activity: Not on file   Other Topics Concern  . Not on file   Social History Narrative  . No narrative on file     BP 130/84  Pulse 85  Ht 5\' 7"  (1.702 m)  Wt 258 lb (117.028 kg)  BMI 40.40 kg/m2  Physical Exam:  Well appearing 58 year old man, obese,NAD HEENT: Unremarkable Neck: 7 cm JVD, no thyromegally Back:  No CVA tenderness Lungs:  Clear with no wheezes, rales, or  rhonchi. HEART:  Regular rate rhythm, no murmurs, no rubs, no clicks Abd:  soft, obese, positive bowel sounds, no organomegally, no rebound, no guarding Ext:  2 plus pulses, no edema, no cyanosis, no clubbing Skin:  No rashes no nodules Neuro:  CN II through XII intact, motor grossly intact   DEVICE  Normal device function.  See PaceArt for details.   Assess/Plan:
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
## 2013-09-02 NOTE — Assessment & Plan Note (Signed)
His Medtronic ICD is working normally. His thresholds are stable. Will recheck in several months

## 2013-09-02 NOTE — Patient Instructions (Signed)
Your physician recommends that you continue on your current medications as directed. Please refer to the Current Medication list given to you today.  Limit your salt intake  Weight reduction  Your physician recommends that you schedule a follow-up appointment in: 3 months with Dr. Antoine PocheHochrein.  Remote monitoring is used to monitor your Pacemaker of ICD from home. This monitoring reduces the number of office visits required to check your device to one time per year. It allows us to keep an eye on the functioning of your device to ensure it is working properly. You are scheduled for a device check from home on 12/07/13. You may send your transmission at any time that day. If you have a wireless device, the transmission will be sent automatically. After your physician reviews your transmission, you will receive a postcard with your next transmission date.  Your physician wants you to follow-up in: 1 year with Dr. Ladona Ridgelaylor.  You will receive a reminder letter in the mail two months in advance. If you don't receive a letter, please call our office to schedule the follow-up appointment.

## 2013-09-02 NOTE — Assessment & Plan Note (Signed)
He is back in NSR today. He is on both ASA and Xarelto.  I will defer the decision to continue both with Dr. Antoine PocheHochrein.

## 2013-09-03 ENCOUNTER — Encounter: Payer: Self-pay | Admitting: Internal Medicine

## 2013-09-05 NOTE — Assessment & Plan Note (Signed)
He has had a h/o of RVR in atrial fib. He will continue his current meds.

## 2013-09-05 NOTE — Assessment & Plan Note (Signed)
His device is working normally. Will recheck in several months. 

## 2013-09-05 NOTE — Assessment & Plan Note (Signed)
He has had no recurrent VT. He will continue his current meds.  

## 2013-09-05 NOTE — Assessment & Plan Note (Signed)
He denies anginal symptoms. No change in meds.  
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
## 2013-11-04 ENCOUNTER — Encounter: Payer: Self-pay | Admitting: Cardiology

## 2013-11-04 ENCOUNTER — Other Ambulatory Visit: Payer: Self-pay | Admitting: *Deleted

## 2013-11-04 ENCOUNTER — Ambulatory Visit (INDEPENDENT_AMBULATORY_CARE_PROVIDER_SITE_OTHER): Payer: Medicare HMO | Admitting: Cardiology

## 2013-11-04 VITALS — BP 114/80 | HR 71 | Ht 69.0 in | Wt 279.7 lb

## 2013-11-04 DIAGNOSIS — I255 Ischemic cardiomyopathy: Secondary | ICD-10-CM

## 2013-11-04 DIAGNOSIS — E782 Mixed hyperlipidemia: Secondary | ICD-10-CM

## 2013-11-04 DIAGNOSIS — I480 Paroxysmal atrial fibrillation: Secondary | ICD-10-CM

## 2013-11-04 DIAGNOSIS — Z79899 Other long term (current) drug therapy: Secondary | ICD-10-CM

## 2013-11-04 DIAGNOSIS — R5383 Other fatigue: Secondary | ICD-10-CM

## 2013-11-04 DIAGNOSIS — R5381 Other malaise: Secondary | ICD-10-CM

## 2013-11-04 DIAGNOSIS — I2589 Other forms of chronic ischemic heart disease: Secondary | ICD-10-CM

## 2013-11-04 MED ORDER — METOPROLOL TARTRATE 50 MG PO TABS
50.0000 mg | ORAL_TABLET | Freq: Every day | ORAL | Status: DC
Start: 2013-11-04 — End: 2014-08-09

## 2013-11-04 NOTE — Progress Notes (Signed)
HPI The patient presents for followup of his cardiomyopathy.  He was in the hospital several weeks ago after a fall and possible seizure. The etiology of this was not entirely clear.  He was in atrial fibrillation at that time with a rapid ventricular rate. He was started on Xarelto.  He denies any further syncope or palpitations. He's had followup with Dr. [name] and has not had any recurrent ventricular tachycardia. He has had documented atrial fib. However, he took himself off of the Xarelto because he had bleeding from the leg wound. He apparently was taking an aspirin at that time. He says he feels great. He continues to lose weight. He denies any chest pressure, neck or arm discomfort. He's not been having any shortness of breath, PND or orthopnea. He's had no weight gain or edema.  Allergies  Allergen Reactions  . Codeine Hives and Itching  . Vioxx [Rofecoxib] Palpitations    "Caused massive heart attack."    Current Outpatient Prescriptions  Medication Sig Dispense Refill  . albuterol (PROVENTIL HFA;VENTOLIN HFA) 108 (90 BASE) MCG/ACT inhaler Inhale 2 puffs into the lungs every 6 (six) hours as needed. For wheezing.      Marland Kitchen ALPRAZolam (XANAX) 1 MG tablet Take 1 mg by mouth 3 (three) times daily as needed. For anxiety.      Marland Kitchen aspirin 325 MG EC tablet Take 325 mg by mouth daily.      Marland Kitchen atorvastatin (LIPITOR) 80 MG tablet Take 80 mg by mouth daily.      . citalopram (CELEXA) 20 MG tablet Take 20 mg by mouth daily.        . cyclobenzaprine (FLEXERIL) 10 MG tablet Take 10 mg by mouth daily.        . fexofenadine (ALLEGRA) 180 MG tablet Take 180 mg by mouth daily.        . Fluticasone-Salmeterol (ADVAIR) 250-50 MCG/DOSE AEPB Inhale 1 puff into the lungs 2 (two) times daily.      . furosemide (LASIX) 40 MG tablet Take 40 mg by mouth daily.      . lansoprazole (PREVACID) 30 MG capsule Take 30 mg by mouth daily.        Marland Kitchen lisinopril (PRINIVIL,ZESTRIL) 20 MG tablet Take 1 tablet by mouth daily.       . meloxicam (MOBIC) 15 MG tablet Take 15 mg by mouth daily.      . metoprolol (LOPRESSOR) 50 MG tablet Take 50 mg by mouth 2 (two) times daily.       . Multiple Vitamin (MULTIVITAMIN WITH MINERALS) TABS Take 1 tablet by mouth daily.      . Omega-3 Fatty Acids (FISH OIL) 1200 MG CAPS Take 1,200 mg by mouth daily.      Marland Kitchen oxyCODONE-acetaminophen (PERCOCET) 10-325 MG per tablet Take 1 tablet by mouth every 6 (six) hours as needed for pain.      Marland Kitchen tiZANidine (ZANAFLEX) 4 MG tablet Take 4 mg by mouth 2 (two) times daily.       . traZODone (DESYREL) 50 MG tablet Take 50 mg by mouth at bedtime.       No current facility-administered medications for this visit.    Past Medical History  Diagnosis Date  . Hypertension   . Obesity   . Dyslipidemia   . Asthma   . Fatigue   . History of heart attack     times 4  . CAD (coronary artery disease)   . CHF (congestive heart failure)   .  Pacemaker   . Myocardial infarction     Past Surgical History  Procedure Laterality Date  . Coronary artery bypass graft  2002    times 4  . Cardiac catheterization  1996    stent placement  . Insert / replace / remove pacemaker      ROS:  As stated in the HPI and negative for all other systems.  PHYSICAL EXAM BP 114/80  Pulse 71  Ht  (1.753 m)  Wt 279 lb 11.2 oz (126.871 kg)  BMI 41.29 kg/m2 GENERAL:  Well appearing HEENT:  Pupils equal round and reactive, fundi not visualized, oral mucosa unremarkable NECK:  No jugular venous distention, waveform within normal limits, carotid upstroke brisk and symmetric, no bruits, no thyromegaly LYMPHATICS:  No cervical, inguinal adenopathy LUNGS:  Clear to auscultation bilaterally BACK:  No CVA tenderness CHEST:  Well healed sternotomy scar and right sided ICD pocket HEART:  PMI not displaced or sustained,S1 and S2 within normal limits, no S3, no S4, no clicks, no rubs, no murmurs ABD:  Flat, positive bowel sounds normal in frequency in pitch, no  bruits, no rebound, no guarding, no midline pulsatile mass, no hepatomegaly, no splenomegaly, obese EXT:  2 plus pulses throughout, mild bilateral ankle edema, no cyanosis no clubbing SKIN:  No rashes no nodules NEURO:  Cranial nerves II through XII grossly intact, motor grossly intact throughout PSYCH:  Cognitively intact, oriented to person place and time  EKG:  NSR, rate 71, lateral infarct, old anteroseptal infarct, demand ventricular pacing, nonspecific lateral T-wave changes. 11/04/2013  ASSESSMENT AND PLAN  CARDIOMYOPATHY:  He seems to be euvolemic. He is tolerating his medications and I will continue as listed.  ATRIAL FIB:  He does not want to take any anticoagulant at this time.  He understands aspirin will not protect him from the risk of stroke that he has with his atrial fibrillation. He understands the indication for anticoagulation for stroke prevention and we discussed this. However, he does not want to take anticoagulants at this point and wants to remain on the medications as listed.  He does agree to come back and talk to me in 2 months to reconsider taking an anticoagulant which I recommend.  CAD:  The patient has no new sypmtoms.  No further cardiovascular testing is indicated.  We will continue with aggressive risk reduction and meds as listed.  DYSLIPIDEMIA:  I will check a lipid profile today.  HTN:  Blood pressure is very slightly elevated. He will remain on the meds as listed.  OBESITY:  I applauded his efforts at weight loss.

## 2013-11-04 NOTE — Patient Instructions (Signed)
Your physician recommends that you schedule a follow-up appointment in:  2 months  Have your labs done as you can

## 2013-11-05 LAB — COMPLETE METABOLIC PANEL WITH GFR
ALK PHOS: 66 U/L (ref 39–117)
ALT: 17 U/L (ref 0–53)
AST: 18 U/L (ref 0–37)
Albumin: 4 g/dL (ref 3.5–5.2)
BUN: 13 mg/dL (ref 6–23)
CO2: 27 mEq/L (ref 19–32)
CREATININE: 0.84 mg/dL (ref 0.50–1.35)
Calcium: 9.3 mg/dL (ref 8.4–10.5)
Chloride: 102 mEq/L (ref 96–112)
GFR, Est African American: >89 mL/min
GFR, Est Non African American: >89 mL/min
Glucose, Bld: 102 mg/dL — ABNORMAL HIGH (ref 70–99)
Potassium: 4.8 mEq/L (ref 3.5–5.3)
Sodium: 139 mEq/L (ref 135–145)
Total Bilirubin: 0.7 mg/dL (ref 0.2–1.2)
Total Protein: 6.8 g/dL (ref 6.0–8.3)

## 2013-11-05 LAB — CBC
HCT: 34.6 % — ABNORMAL LOW (ref 39.0–52.0)
Hemoglobin: 11.4 g/dL — ABNORMAL LOW (ref 13.0–17.0)
MCH: 30.2 pg (ref 26.0–34.0)
MCHC: 32.9 g/dL (ref 30.0–36.0)
MCV: 91.8 fL (ref 78.0–100.0)
PLATELETS: 270 10*3/uL (ref 150–400)
RBC: 3.77 MIL/uL — ABNORMAL LOW (ref 4.22–5.81)
RDW: 15.1 % (ref 11.5–15.5)
WBC: 9.8 10*3/uL (ref 4.0–10.5)

## 2013-11-05 LAB — LIPID PANEL
CHOL/HDL RATIO: 4.2 ratio
Cholesterol: 127 mg/dL (ref 0–200)
HDL: 30 mg/dL — ABNORMAL LOW (ref >39)
LDL Cholesterol: 75 mg/dL (ref 0–99)
TRIGLYCERIDES: 111 mg/dL (ref <150)
VLDL: 22 mg/dL (ref 0–40)

## 2013-11-06 LAB — TSH: TSH: 2.288 u[IU]/mL (ref 0.350–4.500)

## 2013-11-12 ENCOUNTER — Other Ambulatory Visit: Payer: Self-pay | Admitting: *Deleted

## 2013-11-12 DIAGNOSIS — D501 Sideropenic dysphagia: Secondary | ICD-10-CM

## 2013-11-22 ENCOUNTER — Ambulatory Visit (INDEPENDENT_AMBULATORY_CARE_PROVIDER_SITE_OTHER): Payer: Medicare HMO | Admitting: Internal Medicine

## 2013-11-22 VITALS — BP 136/68 | HR 98 | Temp 98.8°F | Resp 18 | Ht 68.0 in | Wt 277.0 lb

## 2013-11-22 DIAGNOSIS — G8929 Other chronic pain: Secondary | ICD-10-CM

## 2013-11-22 DIAGNOSIS — K644 Residual hemorrhoidal skin tags: Secondary | ICD-10-CM

## 2013-11-22 DIAGNOSIS — M549 Dorsalgia, unspecified: Secondary | ICD-10-CM

## 2013-11-22 DIAGNOSIS — Z6841 Body Mass Index (BMI) 40.0 and over, adult: Secondary | ICD-10-CM

## 2013-11-22 MED ORDER — HYDROCORTISONE 2.5 % RE CREA: 1.0000 "application " | TOPICAL_CREAM | Freq: Two times a day (BID) | RECTAL | Status: DC

## 2013-11-22 NOTE — Progress Notes (Signed)
Subjective:  This chart was scribed for Douglas Sia, MD by Elveria Rising, Medial Scribe. This patient was seen in room 8 and the patient's care was started at 10:01 AM.  Chief Complaint  Patient presents with  . Hemorrhoids  . Abdominal Pain     Patient ID: Douglas [name], male    DOB: 05-07-1955, 58 y.o.   MRN: [id number] PCP [facility]  HPI HPI Comments: Douglas [name] is a 58 y.o. male who presents to the [facility] complaining of hemorrhoids flare onset one week ago. Patient reports history of intermittent hemorrhoids with flares up described as swelling at the rectal sphincter that causes painful bowel movements. Patient reports usual resolution with rupture. Experiences this every other month. Patient reports treatment at home with Wet Wipes and soaking in [facility].  Patient is here today requesting preventative treatment for future inflammation as we used for him back in 2005/6   Patient shares continued back pain. He reports attempts to lose weight to alleviate his issues. Patient reports modification to his diet and an exercise regimen. He has lost 30 or 40 pounds. Continues to need narcotic pain medication for back pain.  He is not happy with his current PCP because of the office practice environment has frequent delays/he continues to enjoy Dr. Andrey Campanile however. He has moved and is now closer to this practice and would like to return here. Last here in 2006 Patient Active Problem List   Diagnosis Date Noted  . DJD (degenerative joint disease), lumbosacral 08/26/2013  . Seizure 08/25/2013  . Atrial fibrillation 08/25/2013  . Substance abuse 08/25/2013  . Ventricular tachycardia 05/23/2011  . CAD (coronary artery disease)   . DYSLIPIDEMIA 05/01/2010  . MORBID OBESITY 05/01/2010  . Essential hypertension, benign 05/01/2010  . CARDIOMYOPATHY, ISCHEMIC 05/01/2010  . ICD-Medtronic 05/01/2010   primary cardiology Dr. Antoine Poche  Past  Medical History  Diagnosis Date  . Hypertension   . Obesity   . Dyslipidemia   . Asthma   . Fatigue   . History of heart attack     times 4  . CAD (coronary artery disease)   . CHF (congestive heart failure)   . Pacemaker   . Myocardial infarction    Past Surgical History  Procedure Laterality Date  . Coronary artery bypass graft  2002    times 4  . Cardiac catheterization  1996    stent placement  . Insert / replace / remove pacemaker     Allergies  Allergen Reactions  . Codeine Hives and Itching  . Vioxx [Rofecoxib] Palpitations    "Caused massive heart attack."   Prior to Admission medications   Medication Sig Start Date End Date Taking? Authorizing Provider  albuterol (PROVENTIL HFA;VENTOLIN HFA) 108 (90 BASE) MCG/ACT inhaler Inhale 2 puffs into the lungs every 6 (six) hours as needed. For wheezing.   Yes Historical Provider, MD  ALPRAZolam Prudy Feeler) 1 MG tablet Take 1 mg by mouth 3 (three) times daily as needed. For anxiety.   Yes Historical Provider, MD  aspirin 325 MG EC tablet Take 325 mg by mouth daily.   Yes Historical Provider, MD  atorvastatin (LIPITOR) 80 MG tablet Take 80 mg by mouth daily.   Yes Historical Provider, MD  citalopram (CELEXA) 20 MG tablet Take 20 mg by mouth daily.     Yes Historical Provider, MD  cyclobenzaprine (FLEXERIL) 10 MG tablet Take 10 mg by mouth daily.     Yes Historical Provider,  MD  fexofenadine (ALLEGRA) 180 MG tablet Take 180 mg by mouth daily.     Yes Historical Provider, MD  Fluticasone-Salmeterol (ADVAIR) 250-50 MCG/DOSE AEPB Inhale 1 puff into the lungs 2 (two) times daily.   Yes Historical Provider, MD  furosemide (LASIX) 40 MG tablet Take 40 mg by mouth daily. 08/27/13  Yes Drema Dallas, MD  lansoprazole (PREVACID) 30 MG capsule Take 30 mg by mouth daily.     Yes Historical Provider, MD  lisinopril (PRINIVIL,ZESTRIL) 20 MG tablet Take 1 tablet by mouth daily. 09/16/13  Yes Historical Provider, MD  meloxicam (MOBIC) 15 MG tablet  Take 15 mg by mouth daily.   Yes Historical Provider, MD  metoprolol (LOPRESSOR) 50 MG tablet Take 1 tablet (50 mg total) by mouth daily. 11/04/13  Yes [name], MD  Multiple Vitamin (MULTIVITAMIN WITH MINERALS) TABS Take 1 tablet by mouth daily.   Yes Historical Provider, MD  Omega-3 Fatty Acids (FISH OIL) 1200 MG CAPS Take 1,200 mg by mouth daily.   Yes Historical Provider, MD  oxyCODONE-acetaminophen (PERCOCET) 10-325 MG per tablet Take 1 tablet by mouth every 6 (six) hours as needed for pain.   Yes Historical Provider, MD  tiZANidine (ZANAFLEX) 4 MG tablet Take 4 mg by mouth 2 (two) times daily.  09/29/11  Yes Historical Provider, MD  traZODone (DESYREL) 50 MG tablet Take 50 mg by mouth at bedtime.   Yes Historical Provider, MD     Review of Systems  Constitutional: Negative for fever and chills.   no chest pain or increased dyspnea with exertion No fatigue     Objective:   Physical Exam  Nursing note and vitals reviewed. Constitutional: He is oriented to person, place, and time. He appears well-developed and well-nourished. No distress.  HENT:  Head: Normocephalic and atraumatic.  Eyes: EOM are normal. Pupils are equal, round, and reactive to light.  Cardiovascular: Normal rate.   Pulmonary/Chest: Effort normal. No respiratory distress.  Neurological: He is alert and oriented to person, place, and time.  Psychiatric: He has a normal mood and affect.    Filed Vitals:   11/22/13 0852  BP: 136/68  Pulse: 98  Temp: 98.8 F (37.1 C)  Resp: 18  Height:  (1.727 m)  Weight: 277 lb (125.646 kg)  SpO2: 96%       Assessment & Plan:  Recurrent hemorrhoids with anal fissure Elevated BMI Chronic back pain--- we discussed referral to chronic pain management if he does decide to adopt our practice as his primary  Meds ordered this encounter  Medications  . hydrocortisone (ANUSOL-HC) 2.5 % rectal cream    Sig: Place 1 application rectally 2 (two) times daily. When  hemorrhoids are active    Dispense:  30 g    Refill:  0   Cardizem 2% gel twice a day for 30 days Topical lidocaine if needed to consider surgical followup   I personally performed the services described in this documentation, which was scribed in my presence. The recorded information has been reviewed and is accurate.

## 2013-11-24 ENCOUNTER — Telehealth: Payer: Self-pay | Admitting: Cardiology

## 2013-11-24 NOTE — Telephone Encounter (Signed)
Pt said when He received his last results he was told he was anemic. He was given a lab order to get it rechecked,but does not know when he is suppose to get it.Pt also said he thought the nurse was going to call and suggest some things he could eat for this condition.

## 2013-11-24 NOTE — Telephone Encounter (Signed)
Instructed to get labs rechecked about one month from 11/11/13 labs.  Instructed to eat iron rich foods ie. Dark green leafy vegetables, beans, meat, fish etc.  Voiced understanding and will go to the lab 12/11/13.

## 2013-12-07 ENCOUNTER — Ambulatory Visit (INDEPENDENT_AMBULATORY_CARE_PROVIDER_SITE_OTHER): Payer: Medicare HMO | Admitting: *Deleted

## 2013-12-07 ENCOUNTER — Encounter: Payer: Self-pay | Admitting: Internal Medicine

## 2013-12-07 DIAGNOSIS — I472 Ventricular tachycardia, unspecified: Secondary | ICD-10-CM

## 2013-12-07 DIAGNOSIS — I255 Ischemic cardiomyopathy: Secondary | ICD-10-CM

## 2013-12-07 NOTE — Progress Notes (Signed)
Remote ICD transmission.   

## 2013-12-11 ENCOUNTER — Encounter: Payer: Self-pay | Admitting: *Deleted

## 2013-12-11 ENCOUNTER — Telehealth: Payer: Self-pay | Admitting: Cardiology

## 2013-12-11 LAB — MDC_IDC_ENUM_SESS_TYPE_REMOTE
Brady Statistic AP VP Percent: 0.04 %
Brady Statistic AS VP Percent: 0.05 %
Brady Statistic AS VS Percent: 77.18 %
Brady Statistic RA Percent Paced: 22.77 %
Brady Statistic RV Percent Paced: 0.09 %
Date Time Interrogation Session: 20151012141842
HIGH POWER IMPEDANCE MEASURED VALUE: 228 Ohm
HIGH POWER IMPEDANCE MEASURED VALUE: 418 Ohm
HighPow Impedance: 69 Ohm
Lead Channel Impedance Value: 513 Ohm
Lead Channel Impedance Value: 551 Ohm
Lead Channel Pacing Threshold Amplitude: 2.5 V
Lead Channel Pacing Threshold Pulse Width: 0.4 ms
Lead Channel Pacing Threshold Pulse Width: 0.4 ms
Lead Channel Setting Pacing Amplitude: 2 V
Lead Channel Setting Sensing Sensitivity: 0.3 mV
MDC IDC MSMT BATTERY VOLTAGE: 3.12 V
MDC IDC MSMT LEADCHNL RA SENSING INTR AMPL: 1.875 mV
MDC IDC MSMT LEADCHNL RV PACING THRESHOLD AMPLITUDE: 0.5 V
MDC IDC MSMT LEADCHNL RV SENSING INTR AMPL: 25.625 mV
MDC IDC SET LEADCHNL RA PACING AMPLITUDE: 5 V
MDC IDC SET LEADCHNL RV PACING PULSEWIDTH: 0.4 ms
MDC IDC SET ZONE DETECTION INTERVAL: 300 ms
MDC IDC SET ZONE DETECTION INTERVAL: 350 ms
MDC IDC STAT BRADY AP VS PERCENT: 22.73 %
Zone Setting Detection Interval: 270 ms
Zone Setting Detection Interval: 450 ms

## 2013-12-11 NOTE — Telephone Encounter (Signed)
Katrina is calling to get a ICD-10 code .Marland Kitchen. Please call    Thanks

## 2013-12-11 NOTE — Telephone Encounter (Signed)
LMTCB

## 2013-12-11 NOTE — Telephone Encounter (Signed)
Addressed by another clinic member per Katrina.

## 2013-12-12 LAB — CBC
HCT: 41.2 % (ref 39.0–52.0)
Hemoglobin: 13.3 g/dL (ref 13.0–17.0)
MCH: 29.5 pg (ref 26.0–34.0)
MCHC: 32.3 g/dL (ref 30.0–36.0)
MCV: 91.4 fL (ref 78.0–100.0)
Platelets: 339 10*3/uL (ref 150–400)
RBC: 4.51 MIL/uL (ref 4.22–5.81)
RDW: 14.9 % (ref 11.5–15.5)
WBC: 9.1 10*3/uL (ref 4.0–10.5)

## 2013-12-17 NOTE — Progress Notes (Signed)
Py. Informed about his labs

## 2013-12-18 ENCOUNTER — Encounter: Payer: Self-pay | Admitting: *Deleted

## 2014-01-05 ENCOUNTER — Ambulatory Visit: Payer: Medicare HMO | Admitting: Cardiology

## 2014-02-04 ENCOUNTER — Encounter (HOSPITAL_COMMUNITY): Payer: Self-pay | Admitting: Cardiology

## 2014-02-05 ENCOUNTER — Ambulatory Visit: Payer: Medicare HMO | Admitting: Cardiology

## 2014-03-09 ENCOUNTER — Ambulatory Visit: Payer: Medicare HMO | Admitting: Cardiology

## 2014-03-11 ENCOUNTER — Ambulatory Visit (INDEPENDENT_AMBULATORY_CARE_PROVIDER_SITE_OTHER): Payer: Medicare HMO | Admitting: *Deleted

## 2014-03-11 DIAGNOSIS — I255 Ischemic cardiomyopathy: Secondary | ICD-10-CM

## 2014-03-11 LAB — MDC_IDC_ENUM_SESS_TYPE_REMOTE
Brady Statistic AP VS Percent: 17.07 %
Brady Statistic AS VP Percent: 0.05 %
Brady Statistic AS VS Percent: 82.87 %
Brady Statistic RA Percent Paced: 17.08 %
Date Time Interrogation Session: 20160114163703
HIGH POWER IMPEDANCE MEASURED VALUE: 399 Ohm
HIGH POWER IMPEDANCE MEASURED VALUE: 60 Ohm
Lead Channel Impedance Value: 513 Ohm
Lead Channel Impedance Value: 551 Ohm
Lead Channel Pacing Threshold Amplitude: 0.5 V
Lead Channel Pacing Threshold Pulse Width: 0.4 ms
Lead Channel Sensing Intrinsic Amplitude: 1.75 mV
MDC IDC MSMT BATTERY VOLTAGE: 3.12 V
MDC IDC MSMT LEADCHNL RA PACING THRESHOLD AMPLITUDE: HIGH
MDC IDC MSMT LEADCHNL RA SENSING INTR AMPL: 1.75 mV
MDC IDC MSMT LEADCHNL RV SENSING INTR AMPL: 29.875 mV
MDC IDC MSMT LEADCHNL RV SENSING INTR AMPL: 29.875 mV
MDC IDC SET LEADCHNL RA PACING AMPLITUDE: 5 V
MDC IDC SET LEADCHNL RV PACING AMPLITUDE: 2 V
MDC IDC SET LEADCHNL RV PACING PULSEWIDTH: 0.4 ms
MDC IDC SET LEADCHNL RV SENSING SENSITIVITY: 0.3 mV
MDC IDC SET ZONE DETECTION INTERVAL: 270 ms
MDC IDC SET ZONE DETECTION INTERVAL: 300 ms
MDC IDC STAT BRADY AP VP PERCENT: 0.01 %
MDC IDC STAT BRADY RV PERCENT PACED: 0.06 %
Zone Setting Detection Interval: 350 ms
Zone Setting Detection Interval: 450 ms

## 2014-03-11 NOTE — Progress Notes (Signed)
Remote ICD transmission.   

## 2014-03-30 ENCOUNTER — Encounter: Payer: Self-pay | Admitting: Cardiology

## 2014-04-01 ENCOUNTER — Ambulatory Visit (INDEPENDENT_AMBULATORY_CARE_PROVIDER_SITE_OTHER): Payer: Medicare HMO | Admitting: Cardiology

## 2014-04-01 ENCOUNTER — Encounter: Payer: Self-pay | Admitting: Cardiology

## 2014-04-01 VITALS — BP 142/92 | HR 65 | Ht 69.0 in | Wt 256.5 lb

## 2014-04-01 DIAGNOSIS — I2510 Atherosclerotic heart disease of native coronary artery without angina pectoris: Secondary | ICD-10-CM

## 2014-04-01 DIAGNOSIS — I2583 Coronary atherosclerosis due to lipid rich plaque: Principal | ICD-10-CM

## 2014-04-01 NOTE — Patient Instructions (Signed)
Your physician recommends that you schedule a follow-up appointment in: 6 months with Dr. Hochrein  

## 2014-04-01 NOTE — Progress Notes (Signed)
HPI The patient presents for followup of his cardiomyopathy and atrial fibrillation at that time with a rapid ventricular rate. He was started on Xarelto.  However, he did not want to remain on anticoagulation. He also didn't feel well on Cardizem so we stopped this. Since that time he's been walking. He's lost 24 pounds. He's not had any of the edema he had previously. He says he would know if he was in fibrillation he does not feel this. He says his breathing is better. He denies any chest pressure, neck or arm discomfort. He's had no PND or orthopnea.  Allergies  Allergen Reactions  . Codeine Hives and Itching  . Vioxx [Rofecoxib] Palpitations    "Caused massive heart attack."    Current Outpatient Prescriptions  Medication Sig Dispense Refill  . albuterol (PROVENTIL HFA;VENTOLIN HFA) 108 (90 BASE) MCG/ACT inhaler Inhale 2 puffs into the lungs every 6 (six) hours as needed. For wheezing.    Marland Kitchen ALPRAZolam (XANAX) 1 MG tablet Take 1 mg by mouth 3 (three) times daily as needed. For anxiety.    Marland Kitchen aspirin 325 MG EC tablet Take 325 mg by mouth daily.    Marland Kitchen atorvastatin (LIPITOR) 80 MG tablet Take 80 mg by mouth daily.    . citalopram (CELEXA) 20 MG tablet Take 20 mg by mouth daily.      . cyclobenzaprine (FLEXERIL) 10 MG tablet Take 10 mg by mouth daily.      . fexofenadine (ALLEGRA) 180 MG tablet Take 180 mg by mouth daily.      . Fluticasone-Salmeterol (ADVAIR) 250-50 MCG/DOSE AEPB Inhale 1 puff into the lungs 2 (two) times daily.    . furosemide (LASIX) 40 MG tablet Take 40 mg by mouth daily.    . hydrocortisone (ANUSOL-HC) 2.5 % rectal cream Place 1 application rectally 2 (two) times daily. When hemorrhoids are active 30 g 0  . lansoprazole (PREVACID) 30 MG capsule Take 30 mg by mouth daily.      Marland Kitchen lisinopril (PRINIVIL,ZESTRIL) 20 MG tablet Take 1 tablet by mouth daily.    . meloxicam (MOBIC) 15 MG tablet Take 15 mg by mouth daily.    . metoprolol (LOPRESSOR) 50 MG tablet Take 1 tablet  (50 mg total) by mouth daily. 90 tablet 3  . Multiple Vitamin (MULTIVITAMIN WITH MINERALS) TABS Take 1 tablet by mouth daily.    . Omega-3 Fatty Acids (FISH OIL) 1200 MG CAPS Take 1,200 mg by mouth daily.    Marland Kitchen oxyCODONE-acetaminophen (PERCOCET) 10-325 MG per tablet Take 1 tablet by mouth every 6 (six) hours as needed for pain.    Marland Kitchen tiZANidine (ZANAFLEX) 4 MG tablet Take 4 mg by mouth 2 (two) times daily.     . traZODone (DESYREL) 50 MG tablet Take 50 mg by mouth at bedtime.     No current facility-administered medications for this visit.    Past Medical History  Diagnosis Date  . Hypertension   . Obesity   . Dyslipidemia   . Asthma   . Fatigue   . History of heart attack     times 4  . CAD (coronary artery disease)   . CHF (congestive heart failure)   . Pacemaker     Past Surgical History  Procedure Laterality Date  . Coronary artery bypass graft  2002    times 4  . Cardiac catheterization  1996    stent placement  . Insert / replace / remove pacemaker    . Left heart catheterization  with coronary angiogram N/A 08/29/2011    Procedure: LEFT HEART CATHETERIZATION WITH CORONARY ANGIOGRAM;  Surgeon: Peter M SwazilandJordan, MD;  [facility];  Service: Cardiovascular;  Laterality: N/A;  . Implantable cardioverter defibrillator (icd) generator change N/A 09/10/2011    Procedure: ICD GENERATOR CHANGE;  Surgeon: Marinus MawGregg W Taylor, MD;  [facility];  Service: Cardiovascular;  Laterality: N/A;    ROS:  As stated in the HPI and negative for all other systems.  PHYSICAL EXAM BP 142/92 mmHg  Pulse 65  Ht 5\' 9"  (1.753 m)  Wt 256 lb 8 oz (116.348 kg)  BMI 37.86 kg/m2 GENERAL:  Well appearing NECK:  No jugular venous distention, waveform within normal limits, carotid upstroke brisk and symmetric, no bruits, no thyromegaly LUNGS:  Clear to auscultation bilaterally CHEST:  Well healed sternotomy scar and right sided ICD pocket HEART:  PMI not displaced or sustained,S1 and S2  within normal limits, no S3, no S4, no clicks, no rubs, no murmurs ABD:  Flat, positive bowel sounds normal in frequency in pitch, no bruits, no rebound, no guarding, no midline pulsatile mass, no hepatomegaly, no splenomegaly, obese EXT:  2 plus pulses throughout, mild bilateral ankle edema, no cyanosis no clubbing  EKG:  NSR, rate 65, lateral infarct, old anteroseptal infarct,  nonspecific lateral T-wave changes. 04/01/2014  ASSESSMENT AND PLAN  CARDIOMYOPATHY:  He seems to be euvolemic. He is tolerating his medications and I will continue as listed.  ATRIAL FIB:  He does not want to take any anticoagulant.  He understands aspirin will not protect him from the risk of stroke that he has with his atrial fibrillation. He understands the indication for anticoagulation for stroke prevention and we discussed this at a previous appt. However, he does not want to take anticoagulants at this point.  No change in therapy is indicated.  CAD:  The patient has no new sypmtoms.  No further cardiovascular testing is indicated.  We will continue with aggressive risk reduction and meds as listed.  DYSLIPIDEMIA:    His last LDL was 75. No change in therapy is indicated.  HTN:  Blood pressure is at target.  He will remain on the meds as listed.  OBESITY:  He has lost 25 pounds since September of last year. I encourage more of the same.

## 2014-04-06 ENCOUNTER — Encounter: Payer: Self-pay | Admitting: Internal Medicine

## 2014-06-10 ENCOUNTER — Telehealth: Payer: Self-pay | Admitting: Cardiology

## 2014-06-10 ENCOUNTER — Encounter: Payer: Self-pay | Admitting: Internal Medicine

## 2014-06-10 ENCOUNTER — Ambulatory Visit (INDEPENDENT_AMBULATORY_CARE_PROVIDER_SITE_OTHER): Payer: Medicare HMO | Admitting: *Deleted

## 2014-06-10 DIAGNOSIS — I255 Ischemic cardiomyopathy: Secondary | ICD-10-CM

## 2014-06-10 NOTE — Telephone Encounter (Signed)
LMOVM reminding pt to send remote transmission.   

## 2014-06-10 NOTE — Progress Notes (Signed)
Remote ICD transmission.   

## 2014-06-20 LAB — MDC_IDC_ENUM_SESS_TYPE_REMOTE
Battery Voltage: 3.09 V
Brady Statistic AP VP Percent: 0.02 %
Brady Statistic AS VP Percent: 0.05 %
Brady Statistic AS VS Percent: 73.13 %
Brady Statistic RA Percent Paced: 26.82 %
Date Time Interrogation Session: 20160414203307
HighPow Impedance: 456 Ohm
HighPow Impedance: 73 Ohm
Lead Channel Pacing Threshold Amplitude: 0.5 V
Lead Channel Pacing Threshold Pulse Width: 0.4 ms
Lead Channel Sensing Intrinsic Amplitude: 31.625 mV
Lead Channel Setting Pacing Amplitude: 2 V
Lead Channel Setting Pacing Amplitude: 5 V
Lead Channel Setting Sensing Sensitivity: 0.3 mV
MDC IDC MSMT LEADCHNL RA IMPEDANCE VALUE: 589 Ohm
MDC IDC MSMT LEADCHNL RA SENSING INTR AMPL: 2 mV
MDC IDC MSMT LEADCHNL RV IMPEDANCE VALUE: 551 Ohm
MDC IDC SET LEADCHNL RV PACING PULSEWIDTH: 0.4 ms
MDC IDC SET ZONE DETECTION INTERVAL: 270 ms
MDC IDC STAT BRADY AP VS PERCENT: 26.8 %
MDC IDC STAT BRADY RV PERCENT PACED: 0.07 %
Zone Setting Detection Interval: 300 ms
Zone Setting Detection Interval: 350 ms
Zone Setting Detection Interval: 450 ms

## 2014-06-25 ENCOUNTER — Encounter: Payer: Self-pay | Admitting: Cardiology

## 2014-08-09 ENCOUNTER — Other Ambulatory Visit: Payer: Self-pay

## 2014-08-09 DIAGNOSIS — I480 Paroxysmal atrial fibrillation: Secondary | ICD-10-CM

## 2014-08-09 MED ORDER — METOPROLOL TARTRATE 50 MG PO TABS: 50.0000 mg | ORAL_TABLET | Freq: Every day | ORAL | Status: DC

## 2014-08-27 ENCOUNTER — Encounter: Payer: Self-pay | Admitting: *Deleted

## 2014-10-28 ENCOUNTER — Ambulatory Visit (INDEPENDENT_AMBULATORY_CARE_PROVIDER_SITE_OTHER): Payer: Medicare HMO | Admitting: Internal Medicine

## 2014-10-28 ENCOUNTER — Encounter: Payer: Self-pay | Admitting: Internal Medicine

## 2014-10-28 VITALS — BP 122/84 | HR 60 | Ht 68.0 in | Wt 262.0 lb

## 2014-10-28 DIAGNOSIS — I480 Paroxysmal atrial fibrillation: Secondary | ICD-10-CM | POA: Diagnosis not present

## 2014-10-28 DIAGNOSIS — I472 Ventricular tachycardia, unspecified: Secondary | ICD-10-CM

## 2014-10-28 DIAGNOSIS — Z95810 Presence of automatic (implantable) cardiac defibrillator: Secondary | ICD-10-CM | POA: Diagnosis not present

## 2014-10-28 DIAGNOSIS — I5022 Chronic systolic (congestive) heart failure: Secondary | ICD-10-CM

## 2014-10-28 LAB — CUP PACEART INCLINIC DEVICE CHECK
Brady Statistic AP VS Percent: 25.06 %
Brady Statistic AS VP Percent: 0.05 %
Brady Statistic RA Percent Paced: 25.08 %
Brady Statistic RV Percent Paced: 0.07 %
Date Time Interrogation Session: 20160901125817
HighPow Impedance: 228 Ohm
HighPow Impedance: 418 Ohm
HighPow Impedance: 66 Ohm
Lead Channel Impedance Value: 608 Ohm
Lead Channel Pacing Threshold Amplitude: 0.5 V
Lead Channel Pacing Threshold Pulse Width: 0.4 ms
Lead Channel Pacing Threshold Pulse Width: 0.4 ms
Lead Channel Sensing Intrinsic Amplitude: 1.875 mV
Lead Channel Sensing Intrinsic Amplitude: 2.125 mV
Lead Channel Setting Pacing Amplitude: 2 V
Lead Channel Setting Pacing Amplitude: 5.5 V
Lead Channel Setting Sensing Sensitivity: 0.3 mV
MDC IDC MSMT BATTERY VOLTAGE: 3.07 V
MDC IDC MSMT LEADCHNL RA PACING THRESHOLD AMPLITUDE: 2.75 V
MDC IDC MSMT LEADCHNL RV IMPEDANCE VALUE: 532 Ohm
MDC IDC MSMT LEADCHNL RV SENSING INTR AMPL: 31.625 mV
MDC IDC MSMT LEADCHNL RV SENSING INTR AMPL: 31.625 mV
MDC IDC SET LEADCHNL RV PACING PULSEWIDTH: 0.4 ms
MDC IDC SET ZONE DETECTION INTERVAL: 300 ms
MDC IDC SET ZONE DETECTION INTERVAL: 450 ms
MDC IDC STAT BRADY AP VP PERCENT: 0.02 %
MDC IDC STAT BRADY AS VS PERCENT: 74.87 %
Zone Setting Detection Interval: 270 ms
Zone Setting Detection Interval: 350 ms

## 2014-10-28 NOTE — Progress Notes (Signed)
HPI Douglas [name] returns today for followup. He is a very pleasant 59 year old man with an ischemic cardiomyopathy, chronic systolic heart failure, morbid obesity, status post ICD implantation. He has a history of ventricular tachycardia He denies chest pain.  He also has had additional CHF. The patient has tried to eat less salt but notes some difficulty with this. He has mild peripheral edema. He has started an exercise program and has lost 12 lbs. No ICD shock.  Allergies  Allergen Reactions  . Codeine Hives and Itching  . Vioxx [Rofecoxib] Palpitations    "Caused massive heart attack."     Current Outpatient Prescriptions  Medication Sig Dispense Refill  . albuterol (PROVENTIL HFA;VENTOLIN HFA) 108 (90 BASE) MCG/ACT inhaler Inhale 2 puffs into the lungs every 6 (six) hours as needed. For wheezing.    Marland Kitchen ALPRAZolam (XANAX) 1 MG tablet Take 1 mg by mouth 3 (three) times daily as needed. For anxiety.    Marland Kitchen aspirin 325 MG EC tablet Take 325 mg by mouth daily.    Marland Kitchen atorvastatin (LIPITOR) 80 MG tablet Take 80 mg by mouth daily.    . citalopram (CELEXA) 20 MG tablet Take 20 mg by mouth daily.      . cyclobenzaprine (FLEXERIL) 10 MG tablet Take 10 mg by mouth daily.      . fexofenadine (ALLEGRA) 180 MG tablet Take 180 mg by mouth daily.      . Fluticasone-Salmeterol (ADVAIR) 250-50 MCG/DOSE AEPB Inhale 1 puff into the lungs 2 (two) times daily.    . furosemide (LASIX) 40 MG tablet Take 40 mg by mouth daily.    . hydrocortisone (ANUSOL-HC) 2.5 % rectal cream Place 1 application rectally 2 (two) times daily. When hemorrhoids are active 30 g 0  . lansoprazole (PREVACID) 30 MG capsule Take 30 mg by mouth daily.      Marland Kitchen lisinopril (PRINIVIL,ZESTRIL) 20 MG tablet Take 1 tablet by mouth daily.    . meloxicam (MOBIC) 15 MG tablet Take 15 mg by mouth daily.    . metoprolol (LOPRESSOR) 50 MG tablet Take 1 tablet (50 mg total) by mouth daily. 90 tablet 3  . Multiple Vitamin (MULTIVITAMIN WITH MINERALS) TABS  Take 1 tablet by mouth daily.    . Omega-3 Fatty Acids (FISH OIL) 1200 MG CAPS Take 1,200 mg by mouth daily.    Marland Kitchen oxyCODONE-acetaminophen (PERCOCET) 10-325 MG per tablet Take 1 tablet by mouth every 6 (six) hours as needed for pain.    Marland Kitchen tiZANidine (ZANAFLEX) 4 MG tablet Take 4 mg by mouth 2 (two) times daily.     . traZODone (DESYREL) 50 MG tablet Take 50 mg by mouth at bedtime.     No current facility-administered medications for this visit.     Past Medical History  Diagnosis Date  . Hypertension   . Obesity   . Dyslipidemia   . Asthma   . Fatigue   . History of heart attack     times 4  . CAD (coronary artery disease)   . CHF (congestive heart failure)   . Pacemaker     ROS:   All systems reviewed and negative except as noted in the HPI.   Past Surgical History  Procedure Laterality Date  . Coronary artery bypass graft  2002    times 4  . Cardiac catheterization  1996    stent placement  . Insert / replace / remove pacemaker    . Left heart catheterization with coronary angiogram N/A 08/29/2011  Procedure: LEFT HEART CATHETERIZATION WITH CORONARY ANGIOGRAM;  Surgeon: Peter M Swaziland, MD;  Location: [facility] CATH LAB;  Service: Cardiovascular;  Laterality: N/A;  . Implantable cardioverter defibrillator (icd) generator change N/A 09/10/2011    Procedure: ICD GENERATOR CHANGE;  Surgeon: Marinus Maw, MD;  Location: [facility] CATH LAB;  Service: Cardiovascular;  Laterality: N/A;     Family History  Problem Relation Age of Onset  . Heart attack Father      Social History   Social History  . Marital Status: Divorced    Spouse Name: N/A  . Number of Children: N/A  . Years of Education: N/A   Occupational History  . Not on file.   Social History Main Topics  . Smoking status: Former Smoker    Quit date: 04/23/1989  . Smokeless tobacco: Never Used  . Alcohol Use: No  . Drug Use: Not on file     Comment: marijuana  . Sexual Activity: Not on file   Other Topics  Concern  . Not on file   Social History Narrative     BP 122/84 mmHg  Pulse 60  Ht  (1.727 m)  Wt 262 lb (118.842 kg)  BMI 39.85 kg/m2  SpO2 94%  Physical Exam:  Well appearing 59 year old man, obese,NAD HEENT: Unremarkable Neck: 7 cm JVD, no thyromegally Back:  No CVA tenderness Lungs:  Clear with no wheezes, rales, or rhonchi. HEART:  Regular rate rhythm, no murmurs, no rubs, no clicks Abd:  soft, obese, positive bowel sounds, no organomegally, no rebound, no guarding Ext:  2 plus pulses, no edema, no cyanosis, no clubbing Skin:  No rashes no nodules Neuro:  CN II through XII intact, motor grossly intact   DEVICE  Normal device function.  See PaceArt for details.   Assess/Plan:

## 2014-10-28 NOTE — Patient Instructions (Signed)
Your physician wants you to follow-up in: 12 months with Dr. Taylor. You will receive a reminder letter in the mail two months in advance. If you don't receive a letter, please call our office to schedule the follow-up appointment.  Remote monitoring is used to monitor your Pacemaker of ICD from home. This monitoring reduces the number of office visits required to check your device to one time per year. It allows us to keep an eye on the functioning of your device to ensure it is working properly. You are scheduled for a device check from home on 01/27/15. You may send your transmission at any time that day. If you have a wireless device, the transmission will be sent automatically. After your physician reviews your transmission, you will receive a postcard with your next transmission date.  Your physician recommends that you continue on your current medications as directed. Please refer to the Current Medication list given to you today.  Thank you for choosing [facility]!   

## 2014-10-28 NOTE — Assessment & Plan Note (Signed)
He is doing well from an arrhythmia perspective with no VT on his ICD interogation. Will follow.

## 2014-10-28 NOTE — Assessment & Plan Note (Signed)
His medtronic device is working normally except for an elevated pacing output in the atrium. We have reprogrammed his device to minimize atrial pacing. If he has problems with bradycardia then we would consider atrial lead revision.

## 2014-10-28 NOTE — Assessment & Plan Note (Signed)
He denies anginal symptoms. He will continue his regular daily activity (walking 2-4 miles a day) and his current medications.

## 2014-12-27 DIAGNOSIS — J309 Allergic rhinitis, unspecified: Secondary | ICD-10-CM | POA: Insufficient documentation

## 2014-12-27 DIAGNOSIS — E6609 Other obesity due to excess calories: Secondary | ICD-10-CM | POA: Insufficient documentation

## 2014-12-27 DIAGNOSIS — G8929 Other chronic pain: Secondary | ICD-10-CM | POA: Insufficient documentation

## 2014-12-27 DIAGNOSIS — M545 Low back pain, unspecified: Secondary | ICD-10-CM | POA: Insufficient documentation

## 2014-12-27 DIAGNOSIS — R110 Nausea: Secondary | ICD-10-CM | POA: Insufficient documentation

## 2014-12-27 DIAGNOSIS — M25511 Pain in right shoulder: Secondary | ICD-10-CM | POA: Insufficient documentation

## 2014-12-27 DIAGNOSIS — K219 Gastro-esophageal reflux disease without esophagitis: Secondary | ICD-10-CM | POA: Insufficient documentation

## 2014-12-27 DIAGNOSIS — G4700 Insomnia, unspecified: Secondary | ICD-10-CM | POA: Insufficient documentation

## 2014-12-27 DIAGNOSIS — F32A Depression, unspecified: Secondary | ICD-10-CM | POA: Insufficient documentation

## 2014-12-27 DIAGNOSIS — E785 Hyperlipidemia, unspecified: Secondary | ICD-10-CM | POA: Insufficient documentation

## 2014-12-27 DIAGNOSIS — J45909 Unspecified asthma, uncomplicated: Secondary | ICD-10-CM | POA: Insufficient documentation

## 2014-12-27 DIAGNOSIS — F329 Major depressive disorder, single episode, unspecified: Secondary | ICD-10-CM | POA: Insufficient documentation

## 2014-12-27 DIAGNOSIS — F411 Generalized anxiety disorder: Secondary | ICD-10-CM | POA: Insufficient documentation

## 2014-12-27 DIAGNOSIS — R7301 Impaired fasting glucose: Secondary | ICD-10-CM | POA: Insufficient documentation

## 2015-01-27 ENCOUNTER — Ambulatory Visit (INDEPENDENT_AMBULATORY_CARE_PROVIDER_SITE_OTHER): Payer: Medicare HMO | Admitting: *Deleted

## 2015-01-27 DIAGNOSIS — I5022 Chronic systolic (congestive) heart failure: Secondary | ICD-10-CM

## 2015-01-27 DIAGNOSIS — I255 Ischemic cardiomyopathy: Secondary | ICD-10-CM

## 2015-01-28 NOTE — Progress Notes (Signed)
Remote ICD transmission.   

## 2015-02-07 LAB — CUP PACEART REMOTE DEVICE CHECK
Battery Voltage: 3.09 V
Brady Statistic RA Percent Paced: 4.76 %
Brady Statistic RV Percent Paced: 0.06 %
HighPow Impedance: 418 Ohm
HighPow Impedance: 70 Ohm
Implantable Lead Implant Date: 20060116
Implantable Lead Location: 753859
Implantable Lead Location: 753860
Implantable Lead Model: 6935
Lead Channel Impedance Value: 551 Ohm
Lead Channel Impedance Value: 665 Ohm
Lead Channel Pacing Threshold Amplitude: 2.5 V
Lead Channel Pacing Threshold Pulse Width: 0.4 ms
Lead Channel Sensing Intrinsic Amplitude: 2 mV
Lead Channel Sensing Intrinsic Amplitude: 31.625 mV
Lead Channel Setting Pacing Amplitude: 2 V
Lead Channel Setting Pacing Amplitude: 5.5 V
Lead Channel Setting Pacing Pulse Width: 0.4 ms
MDC IDC LEAD IMPLANT DT: 20130715
MDC IDC MSMT LEADCHNL RA SENSING INTR AMPL: 2 mV
MDC IDC MSMT LEADCHNL RV PACING THRESHOLD AMPLITUDE: 0.5 V
MDC IDC MSMT LEADCHNL RV PACING THRESHOLD PULSEWIDTH: 0.4 ms
MDC IDC MSMT LEADCHNL RV SENSING INTR AMPL: 31.625 mV
MDC IDC SESS DTM: 20161201062306
MDC IDC SET LEADCHNL RV SENSING SENSITIVITY: 0.3 mV
MDC IDC STAT BRADY AP VP PERCENT: 0 %
MDC IDC STAT BRADY AP VS PERCENT: 4.75 %
MDC IDC STAT BRADY AS VP PERCENT: 0.06 %
MDC IDC STAT BRADY AS VS PERCENT: 95.19 %

## 2015-02-08 ENCOUNTER — Encounter: Payer: Self-pay | Admitting: Cardiology

## 2015-04-28 ENCOUNTER — Telehealth: Payer: Self-pay | Admitting: Cardiology

## 2015-04-28 ENCOUNTER — Ambulatory Visit (INDEPENDENT_AMBULATORY_CARE_PROVIDER_SITE_OTHER): Payer: Medicare HMO | Admitting: *Deleted

## 2015-04-28 ENCOUNTER — Telehealth: Payer: Self-pay | Admitting: Internal Medicine

## 2015-04-28 DIAGNOSIS — I255 Ischemic cardiomyopathy: Secondary | ICD-10-CM

## 2015-04-28 NOTE — Telephone Encounter (Signed)
Attempted to return pt call. Number was busy.
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
## 2015-04-28 NOTE — Telephone Encounter (Signed)
Spoke with pt and reminded pt of remote transmission that is due today. Pt verbalized understanding.   

## 2015-04-28 NOTE — Telephone Encounter (Signed)
Pt wants to know if you received his transmission?Please let him know asap.

## 2015-04-28 NOTE — Telephone Encounter (Signed)
Pt said he sent his transmission,wants to know if you received it? He had to send it the old way.Please call.

## 2015-04-29 NOTE — Progress Notes (Signed)
Remote ICD transmission.   

## 2015-05-06 LAB — CUP PACEART REMOTE DEVICE CHECK
Brady Statistic AP VS Percent: 1.48 %
Brady Statistic AS VS Percent: 98.45 %
Date Time Interrogation Session: 20170302185715
HighPow Impedance: 399 Ohm
HighPow Impedance: 67 Ohm
Implantable Lead Location: 753859
Implantable Lead Location: 753860
Implantable Lead Model: 5076
Implantable Lead Model: 6935
Lead Channel Impedance Value: 513 Ohm
Lead Channel Impedance Value: 665 Ohm
Lead Channel Pacing Threshold Amplitude: 0.5 V
Lead Channel Pacing Threshold Amplitude: 2.5 V
Lead Channel Pacing Threshold Pulse Width: 0.4 ms
Lead Channel Sensing Intrinsic Amplitude: 2.25 mV
Lead Channel Sensing Intrinsic Amplitude: 2.25 mV
Lead Channel Setting Pacing Amplitude: 2 V
MDC IDC LEAD IMPLANT DT: 20060116
MDC IDC LEAD IMPLANT DT: 20130715
MDC IDC MSMT BATTERY VOLTAGE: 3.05 V
MDC IDC MSMT LEADCHNL RA PACING THRESHOLD PULSEWIDTH: 0.4 ms
MDC IDC MSMT LEADCHNL RV SENSING INTR AMPL: 31.5 mV
MDC IDC MSMT LEADCHNL RV SENSING INTR AMPL: 31.5 mV
MDC IDC SET LEADCHNL RA PACING AMPLITUDE: 5.5 V
MDC IDC SET LEADCHNL RV PACING PULSEWIDTH: 0.4 ms
MDC IDC SET LEADCHNL RV SENSING SENSITIVITY: 0.3 mV
MDC IDC STAT BRADY AP VP PERCENT: 0 %
MDC IDC STAT BRADY AS VP PERCENT: 0.06 %
MDC IDC STAT BRADY RA PERCENT PACED: 1.49 %
MDC IDC STAT BRADY RV PERCENT PACED: 0.07 %

## 2015-05-06 NOTE — Progress Notes (Signed)
Normal remote reviewed.  Next [facility] 07/28/15 

## 2015-05-11 ENCOUNTER — Encounter: Payer: Self-pay | Admitting: Cardiology

## 2015-07-28 ENCOUNTER — Telehealth: Payer: Self-pay | Admitting: Cardiology

## 2015-07-28 ENCOUNTER — Telehealth: Payer: Self-pay | Admitting: Internal Medicine

## 2015-07-28 ENCOUNTER — Ambulatory Visit (INDEPENDENT_AMBULATORY_CARE_PROVIDER_SITE_OTHER): Payer: Medicare HMO | Admitting: *Deleted

## 2015-07-28 DIAGNOSIS — I255 Ischemic cardiomyopathy: Secondary | ICD-10-CM | POA: Diagnosis not present
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
## 2015-07-28 NOTE — Telephone Encounter (Signed)
Informed pt that we did receive his remote transmission. Pt verbalized understanding.  

## 2015-07-28 NOTE — Telephone Encounter (Signed)
LMOVM reminding pt to send remote transmission.   

## 2015-07-29 ENCOUNTER — Other Ambulatory Visit: Payer: Self-pay

## 2015-07-29 DIAGNOSIS — I480 Paroxysmal atrial fibrillation: Secondary | ICD-10-CM

## 2015-07-29 MED ORDER — METOPROLOL TARTRATE 50 MG PO TABS: 50.0000 mg | ORAL_TABLET | Freq: Every day | ORAL | Status: DC

## 2015-07-29 NOTE — Progress Notes (Signed)
Remote ICD transmission.   

## 2015-08-11 LAB — CUP PACEART REMOTE DEVICE CHECK
Battery Voltage: 3.06 V
Brady Statistic AP VP Percent: 0 %
Brady Statistic AS VP Percent: 0 %
Brady Statistic AS VS Percent: 100 %
Brady Statistic RA Percent Paced: 0 %
Brady Statistic RV Percent Paced: 0 %
Date Time Interrogation Session: 20170601191824
HIGH POWER IMPEDANCE MEASURED VALUE: 456 Ohm
HIGH POWER IMPEDANCE MEASURED VALUE: 78 Ohm
Implantable Lead Implant Date: 20130715
Implantable Lead Location: 753859
Implantable Lead Location: 753860
Implantable Lead Model: 5076
Lead Channel Pacing Threshold Amplitude: 2.5 V
Lead Channel Pacing Threshold Pulse Width: 0.4 ms
Lead Channel Sensing Intrinsic Amplitude: 1.75 mV
Lead Channel Sensing Intrinsic Amplitude: 1.75 mV
Lead Channel Sensing Intrinsic Amplitude: 29 mV
Lead Channel Sensing Intrinsic Amplitude: 29 mV
Lead Channel Setting Pacing Amplitude: 2 V
Lead Channel Setting Pacing Amplitude: 5.5 V
Lead Channel Setting Pacing Pulse Width: 0.4 ms
Lead Channel Setting Sensing Sensitivity: 0.3 mV
MDC IDC LEAD IMPLANT DT: 20060116
MDC IDC LEAD MODEL: 6935
MDC IDC MSMT LEADCHNL RA IMPEDANCE VALUE: 722 Ohm
MDC IDC MSMT LEADCHNL RA PACING THRESHOLD PULSEWIDTH: 0.4 ms
MDC IDC MSMT LEADCHNL RV IMPEDANCE VALUE: 551 Ohm
MDC IDC MSMT LEADCHNL RV PACING THRESHOLD AMPLITUDE: 0.375 V
MDC IDC STAT BRADY AP VS PERCENT: 0 %

## 2015-08-17 ENCOUNTER — Encounter: Payer: Self-pay | Admitting: Cardiology

## 2015-10-10 ENCOUNTER — Other Ambulatory Visit: Payer: Self-pay | Admitting: *Deleted

## 2015-10-10 DIAGNOSIS — I480 Paroxysmal atrial fibrillation: Secondary | ICD-10-CM

## 2015-10-10 MED ORDER — METOPROLOL TARTRATE 50 MG PO TABS: 50.0000 mg | ORAL_TABLET | Freq: Every day | ORAL | 0 refills | Status: DC

## 2015-10-26 ENCOUNTER — Encounter: Payer: Self-pay | Admitting: Cardiology

## 2015-11-04 DIAGNOSIS — Z79899 Other long term (current) drug therapy: Secondary | ICD-10-CM | POA: Insufficient documentation

## 2015-11-04 DIAGNOSIS — M67431 Ganglion, right wrist: Secondary | ICD-10-CM | POA: Insufficient documentation

## 2015-11-09 ENCOUNTER — Encounter: Payer: Self-pay | Admitting: Cardiology

## 2015-11-09 NOTE — Progress Notes (Signed)
HPI The patient presents for followup of his cardiomyopathy and atrial fibrillation  with a rapid ventricular rate. He was started on Xarelto in the past.  However, he did not want to remain on anticoagulation. He also didn't feel well on Cardizem so we stopped this.  Since I last saw him he's done relatively well. He didn't injure himself because a lawnmower fell on him. He just had some x-rays of his right wrist and shoulder. (I was able to review these and there were no fractures).     He has otherwise done well. He walks almost daily. He denies any symptoms related to this. He's had no palpitations, presyncope or syncope. He's had no chest discomfort, neck or arm discomfort. He says he is not having any shortness of breath, PND or orthopnea. He's had a little bit of weight gain.    Allergies  Allergen Reactions  . Codeine Hives and Itching  . Vioxx [Rofecoxib] Palpitations    "Caused massive heart attack."    Current Outpatient Prescriptions  Medication Sig Dispense Refill  . albuterol (PROVENTIL HFA;VENTOLIN HFA) 108 (90 BASE) MCG/ACT inhaler Inhale 2 puffs into the lungs every 6 (six) hours as needed. For wheezing.    Marland Kitchen ALPRAZolam (XANAX) 1 MG tablet Take 1 mg by mouth 3 (three) times daily as needed. For anxiety.    Marland Kitchen aspirin 325 MG EC tablet Take 325 mg by mouth daily.    Marland Kitchen atorvastatin (LIPITOR) 80 MG tablet Take 80 mg by mouth daily.    . Beclomethasone Dipropionate 80 MCG/ACT AERS Place 1-2 sprays into the nose daily.    . citalopram (CELEXA) 20 MG tablet Take 20 mg by mouth daily.      . cyclobenzaprine (FLEXERIL) 10 MG tablet Take 10 mg by mouth daily.      . fexofenadine (ALLEGRA) 180 MG tablet Take 180 mg by mouth daily.      . Fluticasone-Salmeterol (ADVAIR) 250-50 MCG/DOSE AEPB Inhale 1 puff into the lungs 2 (two) times daily.    . furosemide (LASIX) 40 MG tablet Take 40 mg by mouth daily.    . hydrocortisone (ANUSOL-HC) 2.5 % rectal cream Place 1 application rectally 2  (two) times daily. When hemorrhoids are active 30 g 0  . lansoprazole (PREVACID) 30 MG capsule Take 30 mg by mouth daily.      Marland Kitchen lisinopril (PRINIVIL,ZESTRIL) 20 MG tablet Take 1 tablet by mouth daily.    Marland Kitchen loratadine (LORATADINE ALLERGY RELIEF) 10 MG dissolvable tablet Take 10 mg by mouth daily.    . meloxicam (MOBIC) 15 MG tablet Take 15 mg by mouth daily.    . metoprolol succinate (TOPROL-XL) 50 MG 24 hr tablet Take 50 mg by mouth daily.    . Multiple Vitamin (MULTIVITAMIN WITH MINERALS) TABS Take 1 tablet by mouth daily.    . Omega-3 Fatty Acids (FISH OIL) 1200 MG CAPS Take 1,200 mg by mouth daily.    Marland Kitchen oxyCODONE-acetaminophen (PERCOCET) 10-325 MG per tablet Take 1 tablet by mouth every 6 (six) hours as needed for pain.    . potassium chloride SA (K-DUR,KLOR-CON) 20 MEQ tablet Take 1 tablet by mouth daily.    Marland Kitchen tiZANidine (ZANAFLEX) 4 MG tablet Take 4 mg by mouth 2 (two) times daily.      No current facility-administered medications for this visit.     Past Medical History:  Diagnosis Date  . Asthma   . Atrial fibrillation, transient (HCC)   . CAD (coronary artery disease)   .  CHF (congestive heart failure) (HCC)   . Dyslipidemia   . Fatigue   . Hypertension   . Obesity   . Pacemaker     Past Surgical History:  Procedure Laterality Date  . CARDIAC CATHETERIZATION  1996   stent placement  . CORONARY ARTERY BYPASS GRAFT  2002   times 4  . IMPLANTABLE CARDIOVERTER DEFIBRILLATOR (ICD) GENERATOR CHANGE N/A 09/10/2011   Procedure: ICD GENERATOR CHANGE;  Surgeon: Marinus MawGregg W Taylor, MD;  [facility];  Service: Cardiovascular;  Laterality: N/A;  . INSERT / REPLACE / REMOVE PACEMAKER    . LEFT HEART CATHETERIZATION WITH CORONARY ANGIOGRAM N/A 08/29/2011   Procedure: LEFT HEART CATHETERIZATION WITH CORONARY ANGIOGRAM;  Surgeon: Peter M SwazilandJordan, MD;  [facility];  Service: Cardiovascular;  Laterality: N/A;    ROS:     As stated in the HPI and negative for all other  systems.  PHYSICAL EXAM BP 128/82 (BP Location: Left Arm, Patient Position: Sitting, Cuff Size: Large)   Pulse 78   Ht 5\' 9"  (1.753 m)   Wt 264 lb 12.8 oz (120.1 kg)   BMI 39.10 kg/m  GENERAL:  Well appearing NECK:  No jugular venous distention, waveform within normal limits, carotid upstroke brisk and symmetric, no bruits, no thyromegaly LUNGS:  Clear to auscultation bilaterally CHEST:  Well healed sternotomy scar and right sided ICD pocket HEART:  PMI not displaced or sustained,S1 and S2 within normal limits, no S3, no S4, no clicks, no rubs, no murmurs ABD:  Flat, positive bowel sounds normal in frequency in pitch, no bruits, no rebound, no guarding, no midline pulsatile mass, no hepatomegaly, no splenomegaly, obese EXT:  2 plus pulses throughout, mild bilateral ankle edema, no cyanosis no clubbing  EKG:  NSR, rate 79, lateral infarct, old anteroseptal infarct,  nonspecific lateral T-wave changes.  PVCs.    11/10/2015  ASSESSMENT AND PLAN  CARDIOMYOPATHY:  He seems to be euvolemic. He is tolerating his medications and I will continue as listed.  I will repeat an echo given his EF of 20% in the past.  I will try to titrate meds in the future although he's been somewhat reluctant for med changes. He's been intolerant of multiple medications in the past.  ATRIAL FIB:  He does not want to take any anticoagulant.  He understands aspirin will not protect him from the risk of stroke that he has with his atrial fibrillation. He understands the indication for anticoagulation for stroke prevention and we discussed this at a previous appt. However, he does not want to take anticoagulants still .  Douglas [name] has a CHA2DS2 - VASc score of 3 with a risk of stroke of 3.2%.  CAD:  The patient has no new sypmtoms.  No further cardiovascular testing is indicated.  We will continue with aggressive risk reduction and meds as listed.  DYSLIPIDEMIA:    His last LDL was 92. No change in therapy is  indicated.  He understands the need for better diet. I did review his labs today from his primary provider. He is on high-dose statin.  HTN:  Blood pressure is at target.  He will remain on the meds as listed.  OBESITY:  He has gained back some of the weight that he had lost.  I encouraged continued weight loss. He's going to start walking a little bit more.

## 2015-11-10 ENCOUNTER — Ambulatory Visit (INDEPENDENT_AMBULATORY_CARE_PROVIDER_SITE_OTHER): Payer: Medicare HMO | Admitting: Cardiology

## 2015-11-10 ENCOUNTER — Encounter: Payer: Self-pay | Admitting: Cardiology

## 2015-11-10 VITALS — BP 128/82 | HR 78 | Ht 69.0 in | Wt 264.8 lb

## 2015-11-10 DIAGNOSIS — I5022 Chronic systolic (congestive) heart failure: Secondary | ICD-10-CM

## 2015-11-10 DIAGNOSIS — I255 Ischemic cardiomyopathy: Secondary | ICD-10-CM

## 2015-11-10 DIAGNOSIS — I480 Paroxysmal atrial fibrillation: Secondary | ICD-10-CM

## 2015-11-10 NOTE — Patient Instructions (Signed)

## 2015-11-24 ENCOUNTER — Ambulatory Visit (HOSPITAL_COMMUNITY): Payer: Medicare HMO | Attending: Cardiology

## 2015-11-24 ENCOUNTER — Other Ambulatory Visit: Payer: Self-pay

## 2015-11-24 DIAGNOSIS — I340 Nonrheumatic mitral (valve) insufficiency: Secondary | ICD-10-CM | POA: Diagnosis not present

## 2015-11-24 DIAGNOSIS — I255 Ischemic cardiomyopathy: Secondary | ICD-10-CM | POA: Diagnosis not present

## 2015-11-24 MED ORDER — PERFLUTREN LIPID MICROSPHERE
1.0000 mL | INTRAVENOUS | Status: AC | PRN
  Administered 2015-11-24: 3 mL via INTRAVENOUS

## 2015-12-01 ENCOUNTER — Encounter: Payer: Self-pay | Admitting: Internal Medicine

## 2015-12-01 ENCOUNTER — Ambulatory Visit (INDEPENDENT_AMBULATORY_CARE_PROVIDER_SITE_OTHER): Payer: Medicare HMO | Admitting: Internal Medicine

## 2015-12-01 DIAGNOSIS — Z23 Encounter for immunization: Secondary | ICD-10-CM

## 2015-12-01 NOTE — Patient Instructions (Signed)
Your physician wants you to follow-up in: 1 Year with Dr. Taylor.  You will receive a reminder letter in the mail two months in advance. If you don't receive a letter, please call our office to schedule the follow-up appointment.  Your physician recommends that you continue on your current medications as directed. Please refer to the Current Medication list given to you today.  Remote monitoring is used to monitor your Pacemaker of ICD from home. This monitoring reduces the number of office visits required to check your device to one time per year. It allows us to keep an eye on the functioning of your device to ensure it is working properly. You are scheduled for a device check from home on 03/01/16. You may send your transmission at any time that day. If you have a wireless device, the transmission will be sent automatically. After your physician reviews your transmission, you will receive a postcard with your next transmission date.  If you need a refill on your cardiac medications before your next appointment, please call your pharmacy.  Thank you for choosing [facility]!    

## 2015-12-01 NOTE — Progress Notes (Signed)
HPI [name] returns today for followup. He is a very pleasant 60 year old man with an ischemic cardiomyopathy, chronic systolic heart failure, obesity, status post ICD implantation. He has a history of ventricular tachycardia He denies chest pain.  He also has had additional CHF. The patient has tried to eat less salt but notes some difficulty with this. He has mild peripheral edema. He has started an exercise program and has lost weight.  No ICD shock.  Allergies  Allergen Reactions  . Codeine Hives and Itching  . Vioxx [Rofecoxib] Palpitations    "Caused massive heart attack."     Current Outpatient Prescriptions  Medication Sig Dispense Refill  . albuterol (PROVENTIL HFA;VENTOLIN HFA) 108 (90 BASE) MCG/ACT inhaler Inhale 2 puffs into the lungs every 6 (six) hours as needed. For wheezing.    Marland Kitchen. ALPRAZolam (XANAX) 1 MG tablet Take 1 mg by mouth 3 (three) times daily as needed. For anxiety.    Marland Kitchen. aspirin 325 MG EC tablet Take 325 mg by mouth daily.    Marland Kitchen. atorvastatin (LIPITOR) 80 MG tablet Take 80 mg by mouth daily.    . Beclomethasone Dipropionate 80 MCG/ACT AERS Place 1-2 sprays into the nose daily.    . citalopram (CELEXA) 20 MG tablet Take 20 mg by mouth daily.      . cyclobenzaprine (FLEXERIL) 10 MG tablet Take 10 mg by mouth daily.      . fexofenadine (ALLEGRA) 180 MG tablet Take 180 mg by mouth daily.      . Fluticasone-Salmeterol (ADVAIR) 250-50 MCG/DOSE AEPB Inhale 1 puff into the lungs 2 (two) times daily.    . furosemide (LASIX) 40 MG tablet Take 40 mg by mouth daily.    . hydrocortisone (ANUSOL-HC) 2.5 % rectal cream Place 1 application rectally 2 (two) times daily. When hemorrhoids are active 30 g 0  . lansoprazole (PREVACID) 30 MG capsule Take 30 mg by mouth daily.      Marland Kitchen. lisinopril (PRINIVIL,ZESTRIL) 20 MG tablet Take 1 tablet by mouth daily.    Marland Kitchen. loratadine (LORATADINE ALLERGY RELIEF) 10 MG dissolvable tablet Take 10 mg by mouth daily.    . meloxicam (MOBIC) 15 MG tablet  Take 15 mg by mouth daily.    . metoprolol succinate (TOPROL-XL) 50 MG 24 hr tablet Take 50 mg by mouth daily.    . Multiple Vitamin (MULTIVITAMIN WITH MINERALS) TABS Take 1 tablet by mouth daily.    . Omega-3 Fatty Acids (FISH OIL) 1200 MG CAPS Take 1,200 mg by mouth daily.    Marland Kitchen. oxyCODONE-acetaminophen (PERCOCET) 10-325 MG per tablet Take 1 tablet by mouth every 6 (six) hours as needed for pain.    . potassium chloride SA (K-DUR,KLOR-CON) 20 MEQ tablet Take 1 tablet by mouth daily.    Marland Kitchen. tiZANidine (ZANAFLEX) 4 MG tablet Take 4 mg by mouth 2 (two) times daily.      No current facility-administered medications for this visit.      Past Medical History:  Diagnosis Date  . Asthma   . Atrial fibrillation, transient (HCC)   . CAD (coronary artery disease)   . CHF (congestive heart failure) (HCC)   . Dyslipidemia   . Fatigue   . Hypertension   . Obesity   . Pacemaker     ROS:   All systems reviewed and negative except as noted in the HPI.   Past Surgical History:  Procedure Laterality Date  . CARDIAC CATHETERIZATION  1996   stent placement  . CORONARY ARTERY BYPASS  GRAFT  2002   times 4  . IMPLANTABLE CARDIOVERTER DEFIBRILLATOR (ICD) GENERATOR CHANGE N/A 09/10/2011   Procedure: ICD GENERATOR CHANGE;  Surgeon: Douglas Maw, MD;  Location: [facility] CATH LAB;  Service: Cardiovascular;  Laterality: N/A;  . INSERT / REPLACE / REMOVE PACEMAKER    . LEFT HEART CATHETERIZATION WITH CORONARY ANGIOGRAM N/A 08/29/2011   Procedure: LEFT HEART CATHETERIZATION WITH CORONARY ANGIOGRAM;  Surgeon: Douglas M Swaziland, MD;  Location[facility] LAB;  Service: Cardiovascular;  Laterality: N/A;     Family History  Problem Relation Age of Onset  . Heart attack Father      Social History   Social History  . Marital status: Divorced    Spouse name: N/A  . Number of children: N/A  . Years of education: N/A   Occupational History  . Not on file.   Social History Main Topics  . Smoking status: Former  Smoker    Quit date: 04/23/1989  . Smokeless tobacco: Never Used  . Alcohol use No  . Drug use: Unknown     Comment: marijuana  . Sexual activity: Not on file   Other Topics Concern  . Not on file   Social History Narrative  . No narrative on file     BP 136/90   Pulse 63   Ht 5\' 9"  (1.753 m)   Wt 262 lb (118.8 kg)   SpO2 97%   BMI 38.69 kg/m   Physical Exam:  Well appearing 60 year old man, obese,NAD HEENT: Unremarkable Neck: 6 cm JVD, no thyromegally Back:  No CVA tenderness Lungs:  Clear with no wheezes, rales, or rhonchi. HEART:  Regular rate rhythm, no murmurs, no rubs, no clicks Abd:  soft, obese, positive bowel sounds, no organomegally, no rebound, no guarding Ext:  2 plus pulses, no edema, no cyanosis, no clubbing Skin:  No rashes no nodules Neuro:  CN II through XII intact, motor grossly intact   DEVICE  Normal device function.  See PaceArt for details.   Assess/Plan: 1. Chronic systolic heart failure - his symptoms are class 2. He will continue his current meds. 2. VT - he has had no ICD therapies. 3. ICD - His medtronic device is working normally. Will recheck in several months. 4. Obesity - he is exercising daily. He is encouraged to continue to lose more weight.  Douglas [name].D.

## 2015-12-05 LAB — CUP PACEART INCLINIC DEVICE CHECK
Battery Voltage: 3.04 V
Brady Statistic AS VP Percent: 0.06 %
Brady Statistic AS VS Percent: 97.18 %
HIGH POWER IMPEDANCE MEASURED VALUE: 73 Ohm
HighPow Impedance: 418 Ohm
Implantable Lead Implant Date: 20060116
Implantable Lead Location: 753859
Lead Channel Impedance Value: 513 Ohm
Lead Channel Pacing Threshold Amplitude: 2.5 V
Lead Channel Pacing Threshold Pulse Width: 0.4 ms
Lead Channel Sensing Intrinsic Amplitude: 1.875 mV
Lead Channel Setting Pacing Pulse Width: 0.4 ms
MDC IDC LEAD IMPLANT DT: 20130715
MDC IDC LEAD LOCATION: 753860
MDC IDC LEAD MODEL: 6935
MDC IDC MSMT LEADCHNL RA IMPEDANCE VALUE: 760 Ohm
MDC IDC MSMT LEADCHNL RV PACING THRESHOLD AMPLITUDE: 0.5 V
MDC IDC MSMT LEADCHNL RV PACING THRESHOLD PULSEWIDTH: 0.4 ms
MDC IDC MSMT LEADCHNL RV SENSING INTR AMPL: 31.625 mV
MDC IDC SESS DTM: 20171005130801
MDC IDC SET LEADCHNL RA PACING AMPLITUDE: 5.5 V
MDC IDC SET LEADCHNL RV PACING AMPLITUDE: 2 V
MDC IDC SET LEADCHNL RV SENSING SENSITIVITY: 0.3 mV
MDC IDC STAT BRADY AP VP PERCENT: 0 %
MDC IDC STAT BRADY AP VS PERCENT: 2.76 %
MDC IDC STAT BRADY RA PERCENT PACED: 2.76 %
MDC IDC STAT BRADY RV PERCENT PACED: 0.06 %

## 2016-01-12 ENCOUNTER — Other Ambulatory Visit: Payer: Self-pay

## 2016-01-12 MED ORDER — METOPROLOL SUCCINATE ER 50 MG PO TB24: 50.0000 mg | ORAL_TABLET | Freq: Every day | ORAL | 3 refills | Status: DC

## 2016-03-01 ENCOUNTER — Ambulatory Visit (INDEPENDENT_AMBULATORY_CARE_PROVIDER_SITE_OTHER): Payer: Medicare HMO | Admitting: *Deleted

## 2016-03-01 DIAGNOSIS — I255 Ischemic cardiomyopathy: Secondary | ICD-10-CM

## 2016-03-01 NOTE — Progress Notes (Signed)
Remote ICD transmission.   

## 2016-03-02 ENCOUNTER — Encounter: Payer: Self-pay | Admitting: Cardiology

## 2016-03-20 LAB — CUP PACEART REMOTE DEVICE CHECK
Brady Statistic AP VP Percent: 0 %
Brady Statistic AP VS Percent: 3.12 %
Brady Statistic AS VP Percent: 0.05 %
Brady Statistic AS VS Percent: 96.83 %
HIGH POWER IMPEDANCE MEASURED VALUE: 418 Ohm
HIGH POWER IMPEDANCE MEASURED VALUE: 73 Ohm
Implantable Lead Implant Date: 20130715
Implantable Pulse Generator Implant Date: 20130715
Lead Channel Pacing Threshold Amplitude: 0.375 V
Lead Channel Pacing Threshold Pulse Width: 0.4 ms
Lead Channel Sensing Intrinsic Amplitude: 30.25 mV
Lead Channel Sensing Intrinsic Amplitude: 30.25 mV
Lead Channel Setting Pacing Amplitude: 2 V
Lead Channel Setting Pacing Amplitude: 5.5 V
Lead Channel Setting Pacing Pulse Width: 0.4 ms
Lead Channel Setting Sensing Sensitivity: 0.3 mV
MDC IDC LEAD IMPLANT DT: 20060116
MDC IDC LEAD LOCATION: 753859
MDC IDC LEAD LOCATION: 753860
MDC IDC MSMT BATTERY VOLTAGE: 3.03 V
MDC IDC MSMT LEADCHNL RA IMPEDANCE VALUE: 722 Ohm
MDC IDC MSMT LEADCHNL RA PACING THRESHOLD AMPLITUDE: 2.5 V
MDC IDC MSMT LEADCHNL RA PACING THRESHOLD PULSEWIDTH: 0.4 ms
MDC IDC MSMT LEADCHNL RA SENSING INTR AMPL: 2.125 mV
MDC IDC MSMT LEADCHNL RA SENSING INTR AMPL: 2.125 mV
MDC IDC MSMT LEADCHNL RV IMPEDANCE VALUE: 513 Ohm
MDC IDC SESS DTM: 20180104085108
MDC IDC STAT BRADY RA PERCENT PACED: 3.11 %
MDC IDC STAT BRADY RV PERCENT PACED: 0.05 %

## 2016-04-23 ENCOUNTER — Other Ambulatory Visit: Payer: Self-pay | Admitting: Cardiology

## 2016-05-31 ENCOUNTER — Ambulatory Visit (INDEPENDENT_AMBULATORY_CARE_PROVIDER_SITE_OTHER): Payer: Medicare HMO | Admitting: *Deleted

## 2016-05-31 DIAGNOSIS — I255 Ischemic cardiomyopathy: Secondary | ICD-10-CM | POA: Diagnosis not present

## 2016-05-31 LAB — CUP PACEART REMOTE DEVICE CHECK
Battery Voltage: 3.01 V
Brady Statistic RA Percent Paced: 3.04 %
Brady Statistic RV Percent Paced: 0.04 %
Date Time Interrogation Session: 20180405082208
HIGH POWER IMPEDANCE MEASURED VALUE: 456 Ohm
HighPow Impedance: 74 Ohm
Implantable Lead Implant Date: 20060116
Implantable Lead Location: 753859
Implantable Lead Model: 6935
Implantable Pulse Generator Implant Date: 20130715
Lead Channel Impedance Value: 532 Ohm
Lead Channel Pacing Threshold Pulse Width: 0.4 ms
Lead Channel Sensing Intrinsic Amplitude: 2.25 mV
MDC IDC LEAD IMPLANT DT: 20130715
MDC IDC LEAD LOCATION: 753860
MDC IDC MSMT LEADCHNL RA IMPEDANCE VALUE: 779 Ohm
MDC IDC MSMT LEADCHNL RA PACING THRESHOLD AMPLITUDE: 2.5 V
MDC IDC MSMT LEADCHNL RA SENSING INTR AMPL: 2.25 mV
MDC IDC MSMT LEADCHNL RV PACING THRESHOLD AMPLITUDE: 0.375 V
MDC IDC MSMT LEADCHNL RV PACING THRESHOLD PULSEWIDTH: 0.4 ms
MDC IDC MSMT LEADCHNL RV SENSING INTR AMPL: 31.625 mV
MDC IDC MSMT LEADCHNL RV SENSING INTR AMPL: 31.625 mV
MDC IDC SET LEADCHNL RA PACING AMPLITUDE: 5.5 V
MDC IDC SET LEADCHNL RV PACING AMPLITUDE: 2 V
MDC IDC SET LEADCHNL RV PACING PULSEWIDTH: 0.4 ms
MDC IDC SET LEADCHNL RV SENSING SENSITIVITY: 0.3 mV
MDC IDC STAT BRADY AP VP PERCENT: 0 %
MDC IDC STAT BRADY AP VS PERCENT: 3.06 %
MDC IDC STAT BRADY AS VP PERCENT: 0.04 %
MDC IDC STAT BRADY AS VS PERCENT: 96.9 %

## 2016-05-31 NOTE — Progress Notes (Signed)
Remote ICD transmission.   

## 2016-06-01 ENCOUNTER — Encounter: Payer: Self-pay | Admitting: Cardiology

## 2016-08-30 ENCOUNTER — Ambulatory Visit (INDEPENDENT_AMBULATORY_CARE_PROVIDER_SITE_OTHER): Payer: Medicare HMO | Admitting: *Deleted

## 2016-08-30 DIAGNOSIS — I255 Ischemic cardiomyopathy: Secondary | ICD-10-CM

## 2016-08-31 NOTE — Progress Notes (Signed)
Remote ICD transmission.   

## 2016-09-06 ENCOUNTER — Telehealth: Payer: Self-pay | Admitting: *Deleted

## 2016-09-06 LAB — CUP PACEART REMOTE DEVICE CHECK
Battery Voltage: 3 V
Brady Statistic AP VP Percent: 0.04 %
Brady Statistic RA Percent Paced: 2.79 %
Brady Statistic RV Percent Paced: 1.2 %
HIGH POWER IMPEDANCE MEASURED VALUE: 456 Ohm
HighPow Impedance: 79 Ohm
Implantable Lead Implant Date: 20060116
Implantable Lead Location: 753859
Implantable Lead Model: 5076
Implantable Lead Model: 6935
Implantable Pulse Generator Implant Date: 20130715
Lead Channel Impedance Value: 532 Ohm
Lead Channel Pacing Threshold Amplitude: 0.375 V
Lead Channel Pacing Threshold Amplitude: 2.5 V
Lead Channel Pacing Threshold Pulse Width: 0.4 ms
Lead Channel Sensing Intrinsic Amplitude: 2.375 mV
Lead Channel Sensing Intrinsic Amplitude: 2.375 mV
Lead Channel Sensing Intrinsic Amplitude: 31.625 mV
Lead Channel Setting Pacing Pulse Width: 0.4 ms
Lead Channel Setting Sensing Sensitivity: 0.3 mV
MDC IDC LEAD IMPLANT DT: 20130715
MDC IDC LEAD LOCATION: 753860
MDC IDC MSMT LEADCHNL RA IMPEDANCE VALUE: 779 Ohm
MDC IDC MSMT LEADCHNL RV PACING THRESHOLD PULSEWIDTH: 0.4 ms
MDC IDC MSMT LEADCHNL RV SENSING INTR AMPL: 31.625 mV
MDC IDC SESS DTM: 20180705112515
MDC IDC SET LEADCHNL RA PACING AMPLITUDE: 5.5 V
MDC IDC SET LEADCHNL RV PACING AMPLITUDE: 2 V
MDC IDC STAT BRADY AP VS PERCENT: 2.94 %
MDC IDC STAT BRADY AS VP PERCENT: 1.08 %
MDC IDC STAT BRADY AS VS PERCENT: 95.94 %

## 2016-09-06 NOTE — Telephone Encounter (Signed)
Transmission 08/30/16, VT with [facility] 07/13/16. Overnight- patient asymptomatic. He is very grateful that I reached out to him today. He takes his medications as prescribed, stays hydrated and is active. I made him aware of [place] restrictions x 6 months from 07/13/16. I will review episode with Dr. Ladona Ridgelaylor and call him back with any recommendations. He verbalizes understanding.

## 2016-09-07 ENCOUNTER — Encounter: Payer: Self-pay | Admitting: Cardiology

## 2016-11-29 ENCOUNTER — Telehealth: Payer: Self-pay | Admitting: Cardiology

## 2016-11-29 ENCOUNTER — Encounter: Payer: Medicare HMO | Admitting: *Deleted

## 2016-11-29 ENCOUNTER — Telehealth: Payer: Self-pay | Admitting: Internal Medicine

## 2016-11-29 NOTE — Telephone Encounter (Signed)
Spoke with pt and reminded pt of remote transmission that is due today. Pt verbalized understanding.   

## 2016-11-29 NOTE — Telephone Encounter (Signed)
Spoke with pt, attempted to help pt send in a manual transmission, unsuccessful new monitor ordered for pt.

## 2016-11-29 NOTE — Telephone Encounter (Signed)
Patient called back and agreed to an appt on 01-16-2017 at 3:15 PM.

## 2016-11-29 NOTE — Telephone Encounter (Signed)
Please give pt a call and let him know if his transmission was received, he's called twice to [facility] to see if it was received.

## 2016-11-29 NOTE — Telephone Encounter (Signed)
LMOVM for pt to return call. Can patient come to the [facility] office after January 13, 2017?

## 2016-11-29 NOTE — Telephone Encounter (Signed)
Called pt to confirm remote appt for Thursday 11-29-16. Pt is very frustrated because he received a ICD shock in May 2018 and no one has called him to schedule an appt w/ MD.I looked through MD schedule for the remainder of the year in the [place] office but there is no open slots for MD in the [place]. I asked pt if he could come the [facility] office and pt stated that "our rules screw him because he can not drive" I informed pt that the no driving rule is not a [facility] rule it is a [place] DMV rule. Pt stated that I was incriminating myself b/c I was telling him it was ok to break the law. I informed pt that I am not telling him that it is ok to break the DMV rule I am simply letting him know that it is not a [facility] rule it is a [place] DMV rule. I informed pt that I would reach out to the [place] office and see if they could get the patient an appt sooner rather than later. Pt verbalized understanding and appreciation.   After reviewing patients chart with a Device Clinic RN it was discovered that the recommendation for patient to have an appt was never made.

## 2016-12-07 ENCOUNTER — Encounter: Payer: Self-pay | Admitting: Cardiology

## 2016-12-18 ENCOUNTER — Telehealth: Payer: Self-pay | Admitting: *Deleted

## 2016-12-18 NOTE — Telephone Encounter (Signed)
   [facility] Pre-operative Risk Assessment    Request for surgical clearance:  1. What type of surgery is being performed? RIGHT WRIST MASS EXCISION WITH RADIAL ARTERY DISSECTION AND ARTHROTOMY/SYNOVECTOMY    2. When is this surgery scheduled? 02/01/17   3. Are there any medications that need to be held prior to surgery and how long?PER RECEIVED REQUEST PLEASE ADVISE ON ASPIRIN AND PACEMAKER    4. Practice name and name of physician performing surgery? [facility]    5. What is your office phone and fax number? [facility] Anesthesia type (None, local, MAC, general) ? UNKNOWN

## 2016-12-21 NOTE — Telephone Encounter (Signed)
    Chart reviewed as part of pre-operative protocol coverage. Because of Douglas [name]'s past medical history and time since last visit, he/she will require a follow-up visit in order to better assess preoperative cardiovascular risk.   Pre-op covering staff: - Please schedule appointment and call patient to inform them. - Please contact requesting surgeon's office via preferred method (i.e, phone, fax) to inform them of need for appointment prior to surgery.  Douglas [name], [place]  12/21/2016, 4:28 PM

## 2016-12-21 NOTE — Telephone Encounter (Signed)
Patient is scheduled to see Dr Ladona Ridgelaylor on 01/16/17 at 3:15p. Advised him to keep this appointment as his surgery isn't until December. Patient verbalized understanding and agreed with plan.

## 2016-12-24 NOTE — Telephone Encounter (Signed)
Please make sure schedule reflects that patient is being seen for surgical clearance when he sees Dr. Lewayne BuntingGregg Taylor. Tereso NewcomerScott Nachman Sundt, PA-C    12/24/2016 2:41 PM

## 2017-01-03 ENCOUNTER — Other Ambulatory Visit: Payer: Self-pay | Admitting: Cardiology

## 2017-01-16 ENCOUNTER — Encounter: Payer: Self-pay | Admitting: Internal Medicine

## 2017-01-16 ENCOUNTER — Ambulatory Visit: Payer: Medicare HMO | Admitting: Internal Medicine

## 2017-01-16 ENCOUNTER — Encounter (INDEPENDENT_AMBULATORY_CARE_PROVIDER_SITE_OTHER): Payer: Self-pay

## 2017-01-16 VITALS — BP 124/76 | HR 77 | Ht 69.0 in | Wt 263.0 lb

## 2017-01-16 DIAGNOSIS — Z95810 Presence of automatic (implantable) cardiac defibrillator: Secondary | ICD-10-CM | POA: Diagnosis not present

## 2017-01-16 DIAGNOSIS — I5022 Chronic systolic (congestive) heart failure: Secondary | ICD-10-CM

## 2017-01-16 DIAGNOSIS — I255 Ischemic cardiomyopathy: Secondary | ICD-10-CM

## 2017-01-16 LAB — CUP PACEART INCLINIC DEVICE CHECK
Battery Voltage: 2.94 V
Brady Statistic AP VP Percent: 0.01 %
Brady Statistic AS VP Percent: 0.27 %
Brady Statistic AS VS Percent: 95.75 %
Brady Statistic RA Percent Paced: 3.9 %
Brady Statistic RV Percent Paced: 0.3 %
HIGH POWER IMPEDANCE MEASURED VALUE: 456 Ohm
HIGH POWER IMPEDANCE MEASURED VALUE: 70 Ohm
Implantable Lead Implant Date: 20130715
Implantable Lead Location: 753859
Implantable Pulse Generator Implant Date: 20130715
Lead Channel Impedance Value: 551 Ohm
Lead Channel Pacing Threshold Amplitude: 2.25 V
Lead Channel Pacing Threshold Pulse Width: 0.4 ms
Lead Channel Sensing Intrinsic Amplitude: 2 mV
Lead Channel Sensing Intrinsic Amplitude: 31.625 mV
Lead Channel Sensing Intrinsic Amplitude: 31.625 mV
Lead Channel Setting Pacing Amplitude: 2 V
Lead Channel Setting Pacing Amplitude: 5.5 V
Lead Channel Setting Pacing Pulse Width: 0.4 ms
Lead Channel Setting Sensing Sensitivity: 0.3 mV
MDC IDC LEAD IMPLANT DT: 20060116
MDC IDC LEAD LOCATION: 753860
MDC IDC MSMT LEADCHNL RA IMPEDANCE VALUE: 817 Ohm
MDC IDC MSMT LEADCHNL RA PACING THRESHOLD PULSEWIDTH: 1 ms
MDC IDC MSMT LEADCHNL RA SENSING INTR AMPL: 1.875 mV
MDC IDC MSMT LEADCHNL RV PACING THRESHOLD AMPLITUDE: 0.5 V
MDC IDC SESS DTM: 20181121163424
MDC IDC STAT BRADY AP VS PERCENT: 3.96 %

## 2017-01-16 NOTE — Progress Notes (Signed)
HPI [name] returns today for ongoing evaluation and management of his ICD and for preoperative evaluation. He is a very pleasant 61 year old man with a history of ventricular tachycardia, chronic systolic heart failure secondary to an ischemic cardiomyopathy, who recently broke his wrist after a fall. The patient has a history of VT with ICD therapies. He denies any recent ICD shock. He seems somewhat anxious today and notes that he has had some trouble with his neighbors. Allergies  Allergen Reactions  . Codeine Hives and Itching  . Vioxx [Rofecoxib] Palpitations    "Caused massive heart attack."     Current Outpatient Medications  Medication Sig Dispense Refill  . albuterol (PROVENTIL HFA;VENTOLIN HFA) 108 (90 BASE) MCG/ACT inhaler Inhale 2 puffs into the lungs every 6 (six) hours as needed. For wheezing.    Marland Kitchen. ALPRAZolam (XANAX) 1 MG tablet Take 1 mg by mouth 3 (three) times daily as needed. For anxiety.    Marland Kitchen. aspirin 325 MG EC tablet Take 325 mg by mouth daily.    Marland Kitchen. atorvastatin (LIPITOR) 80 MG tablet Take 80 mg by mouth daily.    . Beclomethasone Dipropionate 80 MCG/ACT AERS Place 1-2 sprays into the nose daily.    . citalopram (CELEXA) 20 MG tablet Take 20 mg by mouth daily.      . cyclobenzaprine (FLEXERIL) 10 MG tablet Take 10 mg by mouth daily.      . fexofenadine (ALLEGRA) 180 MG tablet Take 180 mg by mouth daily.      . Fluticasone-Salmeterol (ADVAIR) 250-50 MCG/DOSE AEPB Inhale 1 puff into the lungs 2 (two) times daily.    . furosemide (LASIX) 40 MG tablet Take 40 mg by mouth daily.    . hydrocortisone (ANUSOL-HC) 2.5 % rectal cream Place 1 application rectally 2 (two) times daily. When hemorrhoids are active 30 g 0  . lansoprazole (PREVACID) 30 MG capsule Take 30 mg by mouth daily.      Marland Kitchen. lisinopril (PRINIVIL,ZESTRIL) 20 MG tablet Take 1 tablet by mouth daily.    Marland Kitchen. loratadine (LORATADINE ALLERGY RELIEF) 10 MG dissolvable tablet Take 10 mg by mouth daily.    .  meloxicam (MOBIC) 15 MG tablet Take 15 mg by mouth daily.    . metoprolol succinate (TOPROL-XL) 50 MG 24 hr tablet TAKE ONE (1) TABLET EACH DAY 90 tablet 3  . Multiple Vitamin (MULTIVITAMIN WITH MINERALS) TABS Take 1 tablet by mouth daily.    . Omega-3 Fatty Acids (FISH OIL) 1200 MG CAPS Take 1,200 mg by mouth daily.    Marland Kitchen. oxyCODONE-acetaminophen (PERCOCET) 10-325 MG per tablet Take 1 tablet by mouth every 6 (six) hours as needed for pain.     . potassium chloride SA (K-DUR,KLOR-CON) 20 MEQ tablet Take 1 tablet by mouth daily.    Marland Kitchen. tiZANidine (ZANAFLEX) 4 MG tablet Take 4 mg by mouth 2 (two) times daily.      No current facility-administered medications for this visit.      Past Medical History:  Diagnosis Date  . Asthma   . Atrial fibrillation, transient (HCC)   . CAD (coronary artery disease)   . CHF (congestive heart failure) (HCC)   . Dyslipidemia   . Fatigue   . Hypertension   . Obesity   . Pacemaker     ROS:   All systems reviewed and negative except as noted in the HPI.   Past Surgical History:  Procedure Laterality Date  . CARDIAC CATHETERIZATION  1996   stent  placement  . CORONARY ARTERY BYPASS GRAFT  2002   times 4  . IMPLANTABLE CARDIOVERTER DEFIBRILLATOR (ICD) GENERATOR CHANGE N/A 09/10/2011   Procedure: ICD GENERATOR CHANGE;  Surgeon: Marinus MawGregg W Dantonio Justen, MD;  [facility];  Service: Cardiovascular;  Laterality: N/A;  . INSERT / REPLACE / REMOVE PACEMAKER    . LEFT HEART CATHETERIZATION WITH CORONARY ANGIOGRAM N/A 08/29/2011   Procedure: LEFT HEART CATHETERIZATION WITH CORONARY ANGIOGRAM;  Surgeon: Peter M SwazilandJordan, MD;  [facility];  Service: Cardiovascular;  Laterality: N/A;     Family History  Problem Relation Age of Onset  . Heart attack Father      Social History   Socioeconomic History  . Marital status: Divorced    Spouse name: Not on file  . Number of children: Not on file  . Years of education: Not on file  . Highest education  level: Not on file  Social Needs  . Financial resource strain: Not on file  . Food insecurity - worry: Not on file  . Food insecurity - inability: Not on file  . Transportation needs - medical: Not on file  . Transportation needs - non-medical: Not on file  Occupational History  . Not on file  Tobacco Use  . Smoking status: Former Smoker    Last attempt to quit: 04/23/1989    Years since quitting: 27.7  . Smokeless tobacco: Never Used  Substance and Sexual Activity  . Alcohol use: No    Alcohol/week: 0.0 oz  . Drug use: Not on file    Comment: marijuana  . Sexual activity: Not on file  Other Topics Concern  . Not on file  Social History Narrative  . Not on file     BP 124/76   Pulse 77   Ht 5\' 9"  (1.753 m)   Wt 263 lb (119.3 kg)   BMI 38.84 kg/m   Physical Exam:  Well appearing obese 61 year old man, NAD HEENT: Unremarkable Neck:  7 cm JVD, no thyromegally Lymphatics:  No adenopathy Back:  No CVA tenderness Lungs:  Clear, with rare basilar rales, no wheezes or rhonchi, no increased work of breathing HEART:  Regular rate rhythm, no murmurs, no rubs, no clicks Abd:  soft, obese, positive bowel sounds, no organomegally, no rebound, no guarding Ext:  2 plus pulses, no edema, no cyanosis, no clubbing Skin:  No rashes no nodules Neuro:  CN II through XII intact, motor grossly intact  EKG - normal sinus rhythm with prior anterior MI  DEVICE  Normal device function.  See PaceArt for details.   Assess/Plan: 1. Chronic systolic heart failure - his symptoms appear to be class II. He is encouraged to maintain a low-sodium diet and continue his medical therapy. 2. Ischemic cardiomyopathy - he is fairly active and denies anginal symptoms. No change in medications. 3. Ventricular tachycardia - he has had no recurrent ventricular tachycardia in a proximally 6 months. No change in medical therapy at this time. 4. ICD - his Medtronic dual-chamber ICD is working normally. We'll  plan to recheck in several months. His fluid index is back to baseline. 5. Preoperative evaluation - the patient is pending right wrist surgery with reconstruction. He is a low moderate surgical risk and is an acceptable surgical risk and may proceed with surgery with no additional evaluation. The patient is able to walk at a moderate pace without limitation.  Lewayne BuntingGregg Wilber Fini, M.D.

## 2017-01-16 NOTE — Patient Instructions (Addendum)
Medication Instructions:  Your physician recommends that you continue on your current medications as directed. Please refer to the Current Medication list given to you today.  Labwork: None ordered.  Testing/Procedures: None ordered.  Follow-Up: Your physician wants you to follow-up in: one year with Dr. Ladona Ridgelaylor.   You will receive a reminder letter in the mail two months in advance. If you don't receive a letter, please call our office to schedule the follow-up appointment.  Remote monitoring is used to monitor your ICD from home. This monitoring reduces the number of office visits required to check your device to one time per year. It allows us to keep an eye on the functioning of your device to ensure it is working properly. You are scheduled for a device check from home on 04/17/2017. You may send your transmission at any time that day. If you have a wireless device, the transmission will be sent automatically. After your physician reviews your transmission, you will receive a postcard with your next transmission date.  Any Other Special Instructions Will Be Listed Below (If Applicable).  If you need a refill on your cardiac medications before your next appointment, please call your pharmacy.

## 2017-01-28 ENCOUNTER — Telehealth: Payer: Self-pay | Admitting: Internal Medicine

## 2017-01-28 NOTE — Telephone Encounter (Signed)
Follow up     Patient is following up on the surgical clearance. [facility]? Also needs clarification on medication, he stopped all of his medicines.

## 2017-01-28 NOTE — Telephone Encounter (Signed)
Discussed with and routed to Dr. Lubertha Basqueaylor's nurse, Dr. Ladona Ridgelaylor to advise on holding ASA.

## 2017-01-28 NOTE — Telephone Encounter (Signed)
Attempted to reach patient multiple times.  Call does not go through.  Patient was cleared for surgery per last ov note by Dr. Ladona Ridgelaylor on 01/16/17.  Will route to him and PharmD re: holding aspirin 325mg  and if so for how long.

## 2017-01-28 NOTE — Telephone Encounter (Signed)
F/U call:  Patient calling, states that he is returning your call.

## 2017-01-28 NOTE — Telephone Encounter (Signed)
° °  [facility] Pre-operative Risk Assessment    Request for surgical clearance:  1. What type of surgery is being performed? Rt wrist surgery  2. When is this surgery scheduled? 02-01-17   3. Are there any medications that need to be held prior to surgery and how longCan pt hold Aspirin for 5 days prior to surgery-need to know today please   4. Practice name and name of physician performing surgery?Dr Roseanne Kaufman   5. What is your office phone and fax number?[phone] and fax is [phone]    6. Anesthesia type (None, local, MAC, general) ?[facility]   Glyn Ade 01/28/2017, 4:00 PM  _________________________________________________________________   (provider comments below)

## 2017-01-28 NOTE — Telephone Encounter (Signed)
No anticoagulation listed on pt chart. MD to address antiplatelet therapy.

## 2017-01-29 NOTE — Telephone Encounter (Signed)
Clearance faxed as requested.  Per Dr. Ladona Ridgelaylor, OK to hold ASA for 5 days prior to procedure.  NO further action needed.

## 2017-02-01 ENCOUNTER — Other Ambulatory Visit: Payer: Self-pay | Admitting: Orthopedic Surgery

## 2017-02-27 DIAGNOSIS — M25531 Pain in right wrist: Secondary | ICD-10-CM | POA: Insufficient documentation

## 2017-04-17 ENCOUNTER — Telehealth: Payer: Self-pay | Admitting: Cardiology

## 2017-04-17 ENCOUNTER — Encounter: Payer: Medicare HMO | Admitting: *Deleted

## 2017-04-17 NOTE — Telephone Encounter (Signed)
LMOVM reminding pt to send remote transmission.   

## 2017-04-18 ENCOUNTER — Encounter: Payer: Self-pay | Admitting: Cardiology

## 2017-04-29 ENCOUNTER — Ambulatory Visit (INDEPENDENT_AMBULATORY_CARE_PROVIDER_SITE_OTHER): Payer: Medicare HMO | Admitting: *Deleted

## 2017-04-29 DIAGNOSIS — I255 Ischemic cardiomyopathy: Secondary | ICD-10-CM | POA: Diagnosis not present

## 2017-04-29 NOTE — Progress Notes (Signed)
Remote ICD transmission.   

## 2017-05-01 ENCOUNTER — Encounter: Payer: Self-pay | Admitting: Cardiology

## 2017-05-02 ENCOUNTER — Telehealth: Payer: Self-pay | Admitting: *Deleted

## 2017-05-02 ENCOUNTER — Telehealth: Payer: Self-pay

## 2017-05-02 NOTE — Telephone Encounter (Signed)
LVM for pt to call device clinic back to see if he was having SOB, due to optivol being abnormal and increase in AF burden.

## 2017-05-02 NOTE — Telephone Encounter (Signed)
Following up with Douglas [name] d/t increased AF burden since Dec 2018 and abnormal HF diagnostic since that same time. Douglas [name] reports that with his wrist surgery and the bad weather he was eating fast food and had problems with lower extremity edema. He also drank a lot of water to "flush his system" but recently has gotten to feeling better this week. He admits that he will never have fast food again and he has learned his lesson. Douglas [name] has previously declined OAC d/t low AF burden. AF burden now 81%. Remote transmission being processed now and will be sent to Dr. Ladona Ridgelaylor for review.

## 2017-05-05 LAB — CUP PACEART REMOTE DEVICE CHECK
Battery Voltage: 2.86 V
Brady Statistic AP VP Percent: 0.26 %
Brady Statistic AP VS Percent: 0.17 %
Brady Statistic AS VP Percent: 15.65 %
Brady Statistic AS VS Percent: 83.92 %
HIGH POWER IMPEDANCE MEASURED VALUE: 65 Ohm
HighPow Impedance: 399 Ohm
Implantable Lead Implant Date: 20130715
Implantable Lead Location: 753860
Implantable Lead Model: 5076
Lead Channel Impedance Value: 817 Ohm
Lead Channel Pacing Threshold Amplitude: 0.375 V
Lead Channel Pacing Threshold Amplitude: 2.5 V
Lead Channel Pacing Threshold Pulse Width: 0.4 ms
Lead Channel Sensing Intrinsic Amplitude: 2.125 mV
Lead Channel Sensing Intrinsic Amplitude: 29 mV
Lead Channel Setting Pacing Amplitude: 2 V
Lead Channel Setting Pacing Amplitude: 5.5 V
Lead Channel Setting Pacing Pulse Width: 0.4 ms
Lead Channel Setting Sensing Sensitivity: 0.3 mV
MDC IDC LEAD IMPLANT DT: 20060116
MDC IDC LEAD LOCATION: 753859
MDC IDC MSMT LEADCHNL RA PACING THRESHOLD PULSEWIDTH: 0.4 ms
MDC IDC MSMT LEADCHNL RA SENSING INTR AMPL: 2.125 mV
MDC IDC MSMT LEADCHNL RV IMPEDANCE VALUE: 513 Ohm
MDC IDC MSMT LEADCHNL RV SENSING INTR AMPL: 29 mV
MDC IDC PG IMPLANT DT: 20130715
MDC IDC SESS DTM: 20190304021852
MDC IDC STAT BRADY RA PERCENT PACED: 0.26 %
MDC IDC STAT BRADY RV PERCENT PACED: 16.05 %

## 2017-05-14 ENCOUNTER — Telehealth: Payer: Self-pay | Admitting: Cardiology

## 2017-05-14 NOTE — Telephone Encounter (Signed)
Patient's sister Enid Derry called in. She is not on DPR. Explained I cannot provide her with medical info concerning her brother other than we talked today about his concerns and MD has been notified via a message since he is out of the office. She reports she is concerned about his swelling, however she is not with him at present time. Advised that we will contact patient as soon as MD responds and suggested that she be added to patient's DPR at his next appt

## 2017-05-14 NOTE — Telephone Encounter (Signed)
New Message:    Pt is calling with swelling on the feet and spoke with someone on this morning that advised him he would be able to be seen by Dr. Antoine PocheHochrein today.

## 2017-05-14 NOTE — Telephone Encounter (Signed)
Returned call to patient. Scheduled him for [place], [place] on 05/16/17 @ 9am

## 2017-05-14 NOTE — Telephone Encounter (Signed)
I am not in the office this week.  They would need to be seen by an APP if he thinks he needs to be seen.

## 2017-05-14 NOTE — Telephone Encounter (Signed)
Returned call to patient of Dr. Shona SimpsonHochrein/Dr. Ladona Ridgelaylor. He fell asleep and fell out of his chair when his arm slipped off the arm rest and got a black eye. He had been sitting in a chair, elevating legs on an ottoman d/t edema, when this episode happened.   He reports he "got a hold of fast food" - ate some chinese food about 3 weeks ago. He had surgery on his wrist on Dec 7 and he ate out a few times after he got home. He knows Cong[place]inese food was not a good choice, d/t sodium content. He reports swelling in both feet and weight gain of 4-5lbs in 2 weeks. He reports his edema was improving but then came back, but not as bad, since eating the [place] food. He is compliant with medications - takes lasix 40mg  daily. Per chart review, no recent labs and none in [facility] to assess BUN/creat.   Patient would like to schedule an appointment with Dr. Antoine PocheHochrein - last visit was 10/2015. First available appointment Jul 02, 2017. Will route to MD to review and advise on his concerns/when he should follow up.

## 2017-05-14 NOTE — Telephone Encounter (Signed)
New message   Pt c/o swelling: STAT is pt has developed SOB within 24 hours  1) How much weight have you gained and in what time span? 4-5 lbs 2 weeks  2) If swelling, where is the swelling located? Both feet  3) Are you currently taking a fluid pill? yes  4) Are you currently SOB? no  5) Do you have a log of your daily weights (if so, list)? no  6) Have you gained 3 pounds in a day or 5 pounds in a week? no  7) Have you traveled recently? no  Pt states he has edema in both feet and he fell out of his chair and black his eye

## 2017-05-16 ENCOUNTER — Encounter: Payer: Self-pay | Admitting: Physician Assistant

## 2017-05-16 ENCOUNTER — Ambulatory Visit: Payer: Medicare HMO | Admitting: Physician Assistant

## 2017-05-16 VITALS — BP 120/80 | HR 84 | Ht 69.0 in | Wt 290.8 lb

## 2017-05-16 DIAGNOSIS — I10 Essential (primary) hypertension: Secondary | ICD-10-CM | POA: Diagnosis not present

## 2017-05-16 DIAGNOSIS — E785 Hyperlipidemia, unspecified: Secondary | ICD-10-CM

## 2017-05-16 DIAGNOSIS — I5043 Acute on chronic combined systolic (congestive) and diastolic (congestive) heart failure: Secondary | ICD-10-CM

## 2017-05-16 DIAGNOSIS — I25810 Atherosclerosis of coronary artery bypass graft(s) without angina pectoris: Secondary | ICD-10-CM

## 2017-05-16 DIAGNOSIS — I4819 Other persistent atrial fibrillation: Secondary | ICD-10-CM

## 2017-05-16 DIAGNOSIS — I255 Ischemic cardiomyopathy: Secondary | ICD-10-CM

## 2017-05-16 DIAGNOSIS — I481 Persistent atrial fibrillation: Secondary | ICD-10-CM | POA: Diagnosis not present

## 2017-05-16 DIAGNOSIS — Z95810 Presence of automatic (implantable) cardiac defibrillator: Secondary | ICD-10-CM

## 2017-05-16 DIAGNOSIS — S0591XA Unspecified injury of right eye and orbit, initial encounter: Secondary | ICD-10-CM | POA: Diagnosis not present

## 2017-05-16 DIAGNOSIS — Z79899 Other long term (current) drug therapy: Secondary | ICD-10-CM | POA: Diagnosis not present

## 2017-05-16 NOTE — Patient Instructions (Addendum)
Medication Instructions:  INCREASE- Lasix 40 mg twice a day INCREASE- Potassium 20 meq twice a day  If you need a refill on your cardiac medications before your next appointment, please call your pharmacy.  Labwork: BMP In 1 Week HERE IN OUR OFFICE AT [facility]  Take the provided lab slips for you to take with you to the lab for you blood draw.   You will NOT need to fast   Testing/Procedures: None Ordered  Follow-Up: Your physician wants you to follow-up in: 2-3 weeks with Douglas [name].     Thank you for choosing [facility] at [facility]!!      Address: [address], [place], [place] [address]

## 2017-05-16 NOTE — Progress Notes (Signed)
Cardiology Office Note    Date:  05/16/2017   ID:  Douglas PaganiniRobert G [name], DOB 01/11/1956, MRN [id number]  PCP:  Barbie BannerWilson, Fred H, MD  Cardiologist:  Dr. Antoine PocheHochrein EP: Dr. Ladona Ridgelaylor   Chief Complaint  Patient presents with  . Follow-up    seen for Dr. Antoine PocheHochrein, leg edema    History of Present Illness:  Douglas [name] is a 62 y.o. male with PMH of persistent atrial fibrillation, CAD s/p CABG, HTN, HLD, ICM, h/o VT s/p ICD, and chronic combined systolic and diastolic heart failure.  Cardiac catheterization in June 2002 showed a totally occluded RCA, persistent patency of stent in LAD but with residual 50% ostial narrowing, 20-30% distal left main lesion.  Last cardiac catheterization was performed by Dr. Gala RomneyBensimhon on 08/29/2011 which showed 30-40% distal left main, 80% ostial LAD, total occlusion of proximal LAD, 30-40% left circumflex vessel, occluded distal obtuse marginal branches, proximally occluded RCA lesion, patent LIMA to LAD with small diffusely diseased native LAD, patent SVG to diagonal with 30-40% ostial narrowing, patent sequential SVG to OM 2 and OM 3, EF 30% with inferior akinesis.  His ejection fraction has been very low in the 20% range as seen on echocardiogram in July 2015.  He was last seen by Dr. Antoine PocheHochrein in September 2017.  Repeat echocardiogram in September 2017 continued to show EF 20-25%, grade 3 diastolic dysfunction.  In the previous note, it mentioned that he was started on Xarelto in the past, however he did not want to remain on anticoagulation.  He also did not feel well on Cardizem either.  Patient presents today for cardiology office visit.  For the past several weeks, he has been accumulating fluid.  His weight is now 290 pounds today, he is previous weight in November 2018 was 263 pounds.  He does have more shortness of breath with walking however denies any chest pain.  He does not have significant orthopnea or PND.  His lung is clear on physical exam.  However he does  have 3+ pitting edema in bilateral lower extremity.  He is currently in acute on chronic combined systolic and diastolic heart failure.  I will increase his Lasix to 40 mg twice daily and also potassium supplement to 20 mEq twice daily.  He will need 1 week basic metabolic panel.  I will see him back in 2-3 weeks for reassessment.  The second issue with this patient is recent device interrogation showed his A. fib burden is now 81%.  He is currently in atrial fibrillation.  He says he became very anemic on Xarelto before however willing to try Eliquis in the future.  Unfortunately he fell on his face 2 days ago and right now has a bloodshot right eye.  He noticed some discomfort with eye movement, I am concerned of ocular fracture.  We have set up the patient to be seen by ophthalmology today.  I will hold off on starting him on the Eliquis.  He is aware of symptom and signs of stroke.  I will potentially start him on the Eliquis on the next visit if his eye improves.  The third problem with this patient is the persistent chronic systolic heart failure, echocardiogram in 2015 and the 2017 is essentially unchanged.  I think he would be a good candidate for Entresto, I want to give Sherryll Burgerntresto a try on the next follow-up.   Past Medical History:  Diagnosis Date  . Asthma   . Atrial fibrillation, transient (HCC)   .  CAD (coronary artery disease)   . CHF (congestive heart failure) (HCC)   . Dyslipidemia   . Fatigue   . Hypertension   . Obesity   . Pacemaker     Past Surgical History:  Procedure Laterality Date  . CARDIAC CATHETERIZATION  1996   stent placement  . CORONARY ARTERY BYPASS GRAFT  2002   times 4  . IMPLANTABLE CARDIOVERTER DEFIBRILLATOR (ICD) GENERATOR CHANGE N/A 09/10/2011   Procedure: ICD GENERATOR CHANGE;  Surgeon: Marinus Maw, MD;  Location[facility] C[facility] LAB;  Service: Cardiovascular;  Laterality: N/A;  . INSERT / REPLACE / REMOVE PACEMAKER    . LEFT HEART CATHETERIZATION WITH CORONARY  ANGIOGRAM N/A 08/29/2011   Procedure: LEFT HEART CATHETERIZATION WITH CORONARY ANGIOGRAM;  Surgeon: Peter M Swaziland, MD;  Location: [facility] CATH LAB;  Service: Cardiovascular;  Laterality: N/A;    Current Medications: Outpatient Medications Prior to Visit  Medication Sig Dispense Refill  . albuterol (PROVENTIL HFA;VENTOLIN HFA) 108 (90 BASE) MCG/ACT inhaler Inhale 2 puffs into the lungs every 6 (six) hours as needed. For wheezing.    Marland Kitchen ALPRAZolam (XANAX) 1 MG tablet Take 1 mg by mouth 3 (three) times daily as needed. For anxiety.    Marland Kitchen aspirin 325 MG EC tablet Take 325 mg by mouth daily.    Marland Kitchen atorvastatin (LIPITOR) 80 MG tablet Take 80 mg by mouth daily.    . Beclomethasone Dipropionate 80 MCG/ACT AERS Place 1-2 sprays into the nose daily.    . citalopram (CELEXA) 20 MG tablet Take 20 mg by mouth daily.      . cyclobenzaprine (FLEXERIL) 10 MG tablet Take 10 mg by mouth daily.      . fexofenadine (ALLEGRA) 180 MG tablet Take 180 mg by mouth daily.      . Fluticasone-Salmeterol (ADVAIR) 250-50 MCG/DOSE AEPB Inhale 1 puff into the lungs 2 (two) times daily.    . furosemide (LASIX) 40 MG tablet Take 40 mg by mouth daily.    . hydrocortisone (ANUSOL-HC) 2.5 % rectal cream Place 1 application rectally 2 (two) times daily. When hemorrhoids are active 30 g 0  . lansoprazole (PREVACID) 30 MG capsule Take 30 mg by mouth daily.      Marland Kitchen lisinopril (PRINIVIL,ZESTRIL) 20 MG tablet Take 1 tablet by mouth daily.    Marland Kitchen loratadine (LORATADINE ALLERGY RELIEF) 10 MG dissolvable tablet Take 10 mg by mouth daily.    . meloxicam (MOBIC) 15 MG tablet Take 15 mg by mouth daily.    . metoprolol succinate (TOPROL-XL) 50 MG 24 hr tablet TAKE ONE (1) TABLET EACH DAY 90 tablet 3  . Multiple Vitamin (MULTIVITAMIN WITH MINERALS) TABS Take 1 tablet by mouth daily.    . Omega-3 Fatty Acids (FISH OIL) 1200 MG CAPS Take 1,200 mg by mouth daily.    Marland Kitchen oxyCODONE-acetaminophen (PERCOCET) 10-325 MG per tablet Take 1 tablet by mouth every 6  (six) hours as needed for pain.     . potassium chloride SA (K-DUR,KLOR-CON) 20 MEQ tablet Take 1 tablet by mouth daily.    Marland Kitchen tiZANidine (ZANAFLEX) 4 MG tablet Take 4 mg by mouth 2 (two) times daily.      No facility-administered medications prior to visit.      Allergies:   Codeine and Vioxx [rofecoxib]   Social History   Socioeconomic History  . Marital status: Divorced    Spouse name: Not on file  . Number of children: Not on file  . Years of education: Not on file  .  Highest education level: Not on file  Occupational History  . Not on file  Social Needs  . Financial resource strain: Not on file  . Food insecurity:    Worry: Not on file    Inability: Not on file  . Transportation needs:    Medical: Not on file    Non-medical: Not on file  Tobacco Use  . Smoking status: Former Smoker    Last attempt to quit: 04/23/1989    Years since quitting: 28.0  . Smokeless tobacco: Never Used  Substance and Sexual Activity  . Alcohol use: No    Alcohol/week: 0.0 oz  . Drug use: Not on file    Comment: marijuana  . Sexual activity: Not on file  Lifestyle  . Physical activity:    Days per week: Not on file    Minutes per session: Not on file  . Stress: Not on file  Relationships  . Social connections:    Talks on phone: Not on file    Gets together: Not on file    Attends religious service: Not on file    Active member of club or organization: Not on file    Attends meetings of clubs or organizations: Not on file    Relationship status: Not on file  Other Topics Concern  . Not on file  Social History Narrative  . Not on file     Family History:  The patient's family history includes Heart attack in his father.   ROS:   Please see the history of present illness.    ROS All other systems reviewed and are negative.   PHYSICAL EXAM:   VS:  BP 120/80   Pulse 84   Ht 5\' 9"  (1.753 m)   Wt 290 lb 12.8 oz (131.9 kg)   BMI 42.94 kg/m    GEN: Well nourished, well  developed, in no acute distress  HEENT: bruising over right eye with blood under the conjunctiva Neck: no JVD, carotid bruits, or masses Cardiac: Irregularly irregular; no murmurs, rubs, or gallops. 2-3+ pitting edema, ICD present on the right chest  Respiratory:  clear to auscultation bilaterally, normal work of breathing GI: soft, nontender, nondistended, + BS MS: no deformity or atrophy  Skin: warm and dry, no rash Neuro:  Alert and Oriented x 3, Strength and sensation are intact Psych: euthymic mood, full affect  Wt Readings from Last 3 Encounters:  05/16/17 290 lb 12.8 oz (131.9 kg)  01/16/17 263 lb (119.3 kg)  12/01/15 262 lb (118.8 kg)      Studies/Labs Reviewed:   EKG:  EKG is ordered today.  The ekg ordered today demonstrates atrial fibrillation, heart rate controlled  Recent Labs: No results found for requested labs within last 8760 hours.   Lipid Panel    Component Value Date/Time   CHOL 127 11/04/2013 1654   TRIG 111 11/04/2013 1654   HDL 30 (L) 11/04/2013 1654   CHOLHDL 4.2 11/04/2013 1654   VLDL 22 11/04/2013 1654   LDLCALC 75 11/04/2013 1654    Additional studies/ records that were reviewed today include:   Cath 08/29/2011 Findings:  Ao Pressure: 114/73 (90) LV Pressure:  122/14/25 There was no signficant gradient across the aortic valve on pullback.  Left main: 30-40% distal  LAD: 80% ostially. Occluded proximally.   LCX: Large vessel. 20-30% proximally. Two distal marginals are occluded  RCA: Occluded proximally.  LIMA-LAD: Patent. Native LAD small and diffusely diseased.   SVG - Diag: Patent. 30-40% ostial  narrowing  SVG - OM2 -OM3: Patent. Very mild narrowing in midsection  LV-gram done in the RAO projection: Ejection fraction = ~30% with inferior akinesis  Assessment: 1. 3V CAD with stable revascularization 2. EF 30% with elevated LVEDP  Plan/Discussion: Chest pressure may be related to small vessel ischemia vs elevated  LVEDP. Continue medical therapy. Will give 1 dose IV lasix in holding area.     Echo 11/24/2015 LV EF: 20% -   25% Study Conclusions  - Left ventricle: The cavity size was moderately dilated. There was   mild concentric hypertrophy. Systolic function was severely   reduced. The estimated ejection fraction was in the range of 20%   to 25%. Diffuse hypokinesis. Doppler parameters are consistent   with a reversible restrictive pattern, indicative of decreased   left ventricular diastolic compliance and/or increased left   atrial pressure (grade 3 diastolic dysfunction). - Mitral valve: Calcified annulus. Mildly thickened leaflets .   There was mild regurgitation. - Left atrium: The atrium was moderately dilated. - Right ventricle: Pacer wire or catheter noted in right ventricle.  Impressions:  - Compared to the prior study, there has been no significant   interval change.   ASSESSMENT:    1. Acute on chronic combined systolic and diastolic CHF (congestive heart failure) (HCC)   2. Essential hypertension   3. Medication management   4. Persistent atrial fibrillation (HCC)   5. Ischemic cardiomyopathy   6. Coronary artery disease involving coronary bypass graft of native heart without angina pectoris   7. Hyperlipidemia, unspecified hyperlipidemia type   8. ICD (implantable cardioverter-defibrillator) in place   9. Right eye injury, initial encounter      PLAN:  In order of problems listed above:  1. Acute on chronic combined systolic and diastolic heart failure: He has at least 3+ pitting edema in bilateral lower extremity, baseline EF is 20%.  I will increase his Lasix to 40 mg twice daily and increase his potassium supplement to 20 mEq twice daily.  He will need 1 week basic metabolic panel, I will bring him back in 2-3 weeks for reassessment  2. Persistent atrial fibrillation: Recent device interrogation shows he mainly stays in atrial fibrillation.  I did not add NOAC  and significant bleeding issue.  He has history of anemia and gastric ulcers on Xarelto, although I think we need to discuss Eliquis on follow-up.  He is amenable to start on Eliquis at some point.  3. Mechanical fall with right eye injury: He has significant bruising over his right eye, he fell 2 days ago and hit his right face on his table.  He will be referred to ophthalmology, potentially consider CT to rule out ocular fracture.  At this point is he is still able to move his eyeball without too much discomfort.  This is the main reason I did not start on NOAC today.  4. Ischemic cardiomyopathy s/p ICD: His ICD was placed on the right side, no recent issue with the device.  His ischemic cardiomyopathy is chronic, although I think at some point we need to switch his lisinopril to [facility] as a trial.  5. CAD s/p CABG: Denies any chest pain.  Last cardiac catheterization 2013.  Aspirin and statin  6. Hypertension: Blood pressure well controlled  7. Hyperlipidemia: On Lipitor 80 mg daily.  Lipid panel monitored by primary care provider    Medication Adjustments/Labs and Tests Ordered: Current medicines are reviewed at length with the patient today.  Concerns  regarding medicines are outlined above.  Medication changes, Labs and Tests ordered today are listed in the Patient Instructions below. Patient Instructions  Medication Instructions:  INCREASE- Lasix 40 mg twice a day INCREASE- Potassium 20 meq twice a day  If you need a refill on your cardiac medications before your next appointment, please call your pharmacy.  Labwork: BMP In 1 Week HERE IN OUR OFFICE AT [facility]  Take the provided lab slips for you to take with you to the lab for you blood draw.   You will NOT need to fast   Testing/Procedures: None Ordered  Follow-Up: Your physician wants you to follow-up in: 2-3 weeks with Azalee Course.     Thank you for choosing [facility] at [facility]!!      Address: [address]  [place], [place], [place] [address]        SignedAzalee Course, [place]  05/16/2017 1:20 PM    [facility] [address] [place], [place], [place]  [address] Phone: [phone]; Fax: [phone]

## 2017-05-23 LAB — BASIC METABOLIC PANEL
BUN/Creatinine Ratio: 11 (ref 10–24)
BUN: 14 mg/dL (ref 8–27)
CALCIUM: 9.1 mg/dL (ref 8.6–10.2)
CO2: 26 mmol/L (ref 20–29)
CREATININE: 1.24 mg/dL (ref 0.76–1.27)
Chloride: 99 mmol/L (ref 96–106)
GFR, EST AFRICAN AMERICAN: 72 mL/min/{1.73_m2} (ref >59)
GFR, EST NON AFRICAN AMERICAN: 62 mL/min/{1.73_m2} (ref >59)
Glucose: 106 mg/dL — ABNORMAL HIGH (ref 65–99)
POTASSIUM: 4.4 mmol/L (ref 3.5–5.2)
Sodium: 142 mmol/L (ref 134–144)

## 2017-05-27 ENCOUNTER — Telehealth: Payer: Self-pay | Admitting: Physician Assistant

## 2017-05-27 ENCOUNTER — Other Ambulatory Visit: Payer: Self-pay

## 2017-05-27 DIAGNOSIS — I472 Ventricular tachycardia, unspecified: Secondary | ICD-10-CM

## 2017-05-27 NOTE — Telephone Encounter (Signed)
Pt aware of lab results ./cy 

## 2017-05-27 NOTE — Telephone Encounter (Signed)
New Message   Patient is returning calls in reference to labs. Please call to discuss.

## 2017-05-27 NOTE — Progress Notes (Signed)
Repeat labs:

## 2017-05-28 ENCOUNTER — Telehealth: Payer: Self-pay

## 2017-05-28 NOTE — Telephone Encounter (Signed)
-----   Message from EllisvilleHao Meng, [place] sent at 05/23/2017  7:44 PM EDT ----- Kidney function slightly down when compare to previous lab from 3 years ago. Given the recent increase in fluid pill, recommend repeat BMET in 2 weeks

## 2017-05-28 NOTE — Telephone Encounter (Signed)
Called patient to give results and patient wanted to know if he could decrease his lasix and potassium. He feels like only Dr Antoine PocheHochrein knows his history and has his best interest at heart. Wants Dr Antoine PocheHochrein opinion.

## 2017-05-30 ENCOUNTER — Ambulatory Visit: Payer: Medicare HMO | Admitting: Physician Assistant

## 2017-05-31 NOTE — Telephone Encounter (Signed)
OK to reduce back to 40 mg Lasix once daily and 20 meq potassium once daily.

## 2017-05-31 NOTE — Telephone Encounter (Signed)
Spoke with pt letting him know about Dr Antoine PocheHochrein recommendation to reduce Lasix and Potassium..Marland Kitchen

## 2017-06-02 ENCOUNTER — Emergency Department (HOSPITAL_COMMUNITY): Payer: Medicare HMO

## 2017-06-02 ENCOUNTER — Inpatient Hospital Stay (HOSPITAL_COMMUNITY): Payer: Medicare HMO

## 2017-06-02 ENCOUNTER — Encounter (HOSPITAL_COMMUNITY): Payer: Self-pay | Admitting: Emergency Medicine

## 2017-06-02 ENCOUNTER — Other Ambulatory Visit (HOSPITAL_COMMUNITY): Payer: Self-pay

## 2017-06-02 ENCOUNTER — Inpatient Hospital Stay (HOSPITAL_COMMUNITY)
Admission: EM | Admit: 2017-06-02 | Discharge: 2017-06-10 | DRG: 551 | Disposition: A | Discharge status: Skilled Nursing Facility | Payer: Medicare HMO | Attending: Family Medicine | Admitting: Family Medicine

## 2017-06-02 DIAGNOSIS — I5023 Acute on chronic systolic (congestive) heart failure: Secondary | ICD-10-CM | POA: Diagnosis present

## 2017-06-02 DIAGNOSIS — I2510 Atherosclerotic heart disease of native coronary artery without angina pectoris: Secondary | ICD-10-CM | POA: Diagnosis present

## 2017-06-02 DIAGNOSIS — K219 Gastro-esophageal reflux disease without esophagitis: Secondary | ICD-10-CM | POA: Diagnosis present

## 2017-06-02 DIAGNOSIS — Z6836 Body mass index (BMI) 36.0-36.9, adult: Secondary | ICD-10-CM

## 2017-06-02 DIAGNOSIS — Z79899 Other long term (current) drug therapy: Secondary | ICD-10-CM

## 2017-06-02 DIAGNOSIS — S2249XA Multiple fractures of ribs, unspecified side, initial encounter for closed fracture: Secondary | ICD-10-CM

## 2017-06-02 DIAGNOSIS — Z951 Presence of aortocoronary bypass graft: Secondary | ICD-10-CM

## 2017-06-02 DIAGNOSIS — Z23 Encounter for immunization: Secondary | ICD-10-CM

## 2017-06-02 DIAGNOSIS — S27321A Contusion of lung, unilateral, initial encounter: Secondary | ICD-10-CM | POA: Diagnosis present

## 2017-06-02 DIAGNOSIS — E785 Hyperlipidemia, unspecified: Secondary | ICD-10-CM | POA: Diagnosis present

## 2017-06-02 DIAGNOSIS — N320 Bladder-neck obstruction: Secondary | ICD-10-CM | POA: Diagnosis present

## 2017-06-02 DIAGNOSIS — M79602 Pain in left arm: Secondary | ICD-10-CM

## 2017-06-02 DIAGNOSIS — S299XXA Unspecified injury of thorax, initial encounter: Secondary | ICD-10-CM | POA: Diagnosis not present

## 2017-06-02 DIAGNOSIS — D72829 Elevated white blood cell count, unspecified: Secondary | ICD-10-CM | POA: Diagnosis present

## 2017-06-02 DIAGNOSIS — S0083XA Contusion of other part of head, initial encounter: Secondary | ICD-10-CM | POA: Diagnosis present

## 2017-06-02 DIAGNOSIS — S32029A Unspecified fracture of second lumbar vertebra, initial encounter for closed fracture: Secondary | ICD-10-CM | POA: Diagnosis present

## 2017-06-02 DIAGNOSIS — S2241XA Multiple fractures of ribs, right side, initial encounter for closed fracture: Secondary | ICD-10-CM | POA: Diagnosis present

## 2017-06-02 DIAGNOSIS — R55 Syncope and collapse: Secondary | ICD-10-CM | POA: Diagnosis present

## 2017-06-02 DIAGNOSIS — S0285XA Fracture of orbit, unspecified, initial encounter for closed fracture: Secondary | ICD-10-CM | POA: Diagnosis present

## 2017-06-02 DIAGNOSIS — S0531XA Ocular laceration without prolapse or loss of intraocular tissue, right eye, initial encounter: Secondary | ICD-10-CM | POA: Diagnosis present

## 2017-06-02 DIAGNOSIS — S0521XA Ocular laceration and rupture with prolapse or loss of intraocular tissue, right eye, initial encounter: Secondary | ICD-10-CM | POA: Diagnosis present

## 2017-06-02 DIAGNOSIS — Z9181 History of falling: Secondary | ICD-10-CM

## 2017-06-02 DIAGNOSIS — E1165 Type 2 diabetes mellitus with hyperglycemia: Secondary | ICD-10-CM | POA: Diagnosis not present

## 2017-06-02 DIAGNOSIS — Z950 Presence of cardiac pacemaker: Secondary | ICD-10-CM | POA: Diagnosis not present

## 2017-06-02 DIAGNOSIS — I110 Hypertensive heart disease with heart failure: Secondary | ICD-10-CM | POA: Diagnosis present

## 2017-06-02 DIAGNOSIS — S5012XA Contusion of left forearm, initial encounter: Secondary | ICD-10-CM | POA: Diagnosis present

## 2017-06-02 DIAGNOSIS — J9811 Atelectasis: Secondary | ICD-10-CM | POA: Diagnosis present

## 2017-06-02 DIAGNOSIS — M4850XA Collapsed vertebra, not elsewhere classified, site unspecified, initial encounter for fracture: Secondary | ICD-10-CM | POA: Diagnosis present

## 2017-06-02 DIAGNOSIS — I4891 Unspecified atrial fibrillation: Secondary | ICD-10-CM | POA: Diagnosis not present

## 2017-06-02 DIAGNOSIS — S22089A Unspecified fracture of T11-T12 vertebra, initial encounter for closed fracture: Secondary | ICD-10-CM

## 2017-06-02 DIAGNOSIS — Y92410 Unspecified street and highway as the place of occurrence of the external cause: Secondary | ICD-10-CM | POA: Diagnosis not present

## 2017-06-02 DIAGNOSIS — K5790 Diverticulosis of intestine, part unspecified, without perforation or abscess without bleeding: Secondary | ICD-10-CM | POA: Diagnosis present

## 2017-06-02 DIAGNOSIS — T07XXXA Unspecified multiple injuries, initial encounter: Secondary | ICD-10-CM | POA: Diagnosis not present

## 2017-06-02 DIAGNOSIS — J9621 Acute and chronic respiratory failure with hypoxia: Secondary | ICD-10-CM | POA: Diagnosis present

## 2017-06-02 DIAGNOSIS — R4020 Unspecified coma: Secondary | ICD-10-CM | POA: Diagnosis present

## 2017-06-02 DIAGNOSIS — I481 Persistent atrial fibrillation: Secondary | ICD-10-CM | POA: Diagnosis present

## 2017-06-02 DIAGNOSIS — Z7982 Long term (current) use of aspirin: Secondary | ICD-10-CM

## 2017-06-02 DIAGNOSIS — S40029A Contusion of unspecified upper arm, initial encounter: Secondary | ICD-10-CM

## 2017-06-02 DIAGNOSIS — S0231XA Fracture of orbital floor, right side, initial encounter for closed fracture: Secondary | ICD-10-CM | POA: Diagnosis present

## 2017-06-02 DIAGNOSIS — Z8781 Personal history of (healed) traumatic fracture: Secondary | ICD-10-CM | POA: Insufficient documentation

## 2017-06-02 DIAGNOSIS — Z888 Allergy status to other drugs, medicaments and biological substances status: Secondary | ICD-10-CM

## 2017-06-02 DIAGNOSIS — G8929 Other chronic pain: Secondary | ICD-10-CM | POA: Diagnosis present

## 2017-06-02 DIAGNOSIS — D689 Coagulation defect, unspecified: Secondary | ICD-10-CM | POA: Diagnosis present

## 2017-06-02 DIAGNOSIS — Z7951 Long term (current) use of inhaled steroids: Secondary | ICD-10-CM

## 2017-06-02 DIAGNOSIS — Z955 Presence of coronary angioplasty implant and graft: Secondary | ICD-10-CM

## 2017-06-02 DIAGNOSIS — Z95810 Presence of automatic (implantable) cardiac defibrillator: Secondary | ICD-10-CM

## 2017-06-02 DIAGNOSIS — R4182 Altered mental status, unspecified: Secondary | ICD-10-CM | POA: Diagnosis not present

## 2017-06-02 DIAGNOSIS — I340 Nonrheumatic mitral (valve) insufficiency: Secondary | ICD-10-CM

## 2017-06-02 DIAGNOSIS — R7309 Other abnormal glucose: Secondary | ICD-10-CM | POA: Diagnosis present

## 2017-06-02 DIAGNOSIS — Z751 Person awaiting admission to adequate facility elsewhere: Secondary | ICD-10-CM

## 2017-06-02 DIAGNOSIS — Z87891 Personal history of nicotine dependence: Secondary | ICD-10-CM

## 2017-06-02 DIAGNOSIS — I5043 Acute on chronic combined systolic (congestive) and diastolic (congestive) heart failure: Secondary | ICD-10-CM | POA: Diagnosis present

## 2017-06-02 DIAGNOSIS — J4540 Moderate persistent asthma, uncomplicated: Secondary | ICD-10-CM | POA: Diagnosis present

## 2017-06-02 DIAGNOSIS — S32019A Unspecified fracture of first lumbar vertebra, initial encounter for closed fracture: Principal | ICD-10-CM | POA: Diagnosis present

## 2017-06-02 DIAGNOSIS — R569 Unspecified convulsions: Secondary | ICD-10-CM

## 2017-06-02 DIAGNOSIS — S0281XA Fracture of other specified skull and facial bones, right side, initial encounter for closed fracture: Secondary | ICD-10-CM | POA: Diagnosis not present

## 2017-06-02 DIAGNOSIS — I4819 Other persistent atrial fibrillation: Secondary | ICD-10-CM | POA: Diagnosis present

## 2017-06-02 DIAGNOSIS — M47812 Spondylosis without myelopathy or radiculopathy, cervical region: Secondary | ICD-10-CM | POA: Diagnosis present

## 2017-06-02 DIAGNOSIS — Z885 Allergy status to narcotic agent status: Secondary | ICD-10-CM

## 2017-06-02 DIAGNOSIS — I255 Ischemic cardiomyopathy: Secondary | ICD-10-CM | POA: Diagnosis present

## 2017-06-02 DIAGNOSIS — R739 Hyperglycemia, unspecified: Secondary | ICD-10-CM | POA: Diagnosis present

## 2017-06-02 DIAGNOSIS — F411 Generalized anxiety disorder: Secondary | ICD-10-CM | POA: Diagnosis present

## 2017-06-02 DIAGNOSIS — M47892 Other spondylosis, cervical region: Secondary | ICD-10-CM | POA: Diagnosis present

## 2017-06-02 HISTORY — DX: Presence of automatic (implantable) cardiac defibrillator: Z95.810

## 2017-06-02 HISTORY — DX: Pure hypercholesterolemia, unspecified: E78.00

## 2017-06-02 HISTORY — DX: Anemia, unspecified: D64.9

## 2017-06-02 HISTORY — DX: Unspecified atrial fibrillation: I48.91

## 2017-06-02 LAB — ETHANOL: Alcohol, Ethyl (B): <10 mg/dL (ref <10)

## 2017-06-02 LAB — CBC
HEMATOCRIT: 37.8 % — AB (ref 39.0–52.0)
HEMOGLOBIN: 11.3 g/dL — AB (ref 13.0–17.0)
MCH: 26.7 pg (ref 26.0–34.0)
MCHC: 29.9 g/dL — AB (ref 30.0–36.0)
MCV: 89.2 fL (ref 78.0–100.0)
Platelets: 266 10*3/uL (ref 150–400)
RBC: 4.24 MIL/uL (ref 4.22–5.81)
RDW: 15.7 % — ABNORMAL HIGH (ref 11.5–15.5)
WBC: 18.8 10*3/uL — ABNORMAL HIGH (ref 4.0–10.5)

## 2017-06-02 LAB — COMPREHENSIVE METABOLIC PANEL
ALBUMIN: 3.2 g/dL — AB (ref 3.5–5.0)
ALT: 33 U/L (ref 17–63)
ANION GAP: 10 (ref 5–15)
AST: 51 U/L — ABNORMAL HIGH (ref 15–41)
Alkaline Phosphatase: 80 U/L (ref 38–126)
BILIRUBIN TOTAL: 0.7 mg/dL (ref 0.3–1.2)
BUN: 22 mg/dL — ABNORMAL HIGH (ref 6–20)
CO2: 22 mmol/L (ref 22–32)
Calcium: 8.2 mg/dL — ABNORMAL LOW (ref 8.9–10.3)
Chloride: 107 mmol/L (ref 101–111)
Creatinine, Ser: 1.02 mg/dL (ref 0.61–1.24)
GFR calc Af Amer: >60 mL/min (ref >60)
GFR calc non Af Amer: >60 mL/min (ref >60)
GLUCOSE: 143 mg/dL — AB (ref 65–99)
POTASSIUM: 4.2 mmol/L (ref 3.5–5.1)
SODIUM: 139 mmol/L (ref 135–145)
TOTAL PROTEIN: 6 g/dL — AB (ref 6.5–8.1)

## 2017-06-02 LAB — TROPONIN I: Troponin I: <0.03 ng/mL (ref <0.03)

## 2017-06-02 LAB — BLOOD PRODUCT ORDER (VERBAL) VERIFICATION

## 2017-06-02 LAB — I-STAT CHEM 8, ED
BUN: 22 mg/dL — AB (ref 6–20)
Calcium, Ion: 0.97 mmol/L — ABNORMAL LOW (ref 1.15–1.40)
Chloride: 106 mmol/L (ref 101–111)
Creatinine, Ser: 0.9 mg/dL (ref 0.61–1.24)
Glucose, Bld: 141 mg/dL — ABNORMAL HIGH (ref 65–99)
HEMATOCRIT: 39 % (ref 39.0–52.0)
Hemoglobin: 13.3 g/dL (ref 13.0–17.0)
POTASSIUM: 4.1 mmol/L (ref 3.5–5.1)
SODIUM: 140 mmol/L (ref 135–145)
TCO2: 22 mmol/L (ref 22–32)

## 2017-06-02 LAB — I-STAT ARTERIAL BLOOD GAS, ED
ACID-BASE DEFICIT: 2 mmol/L (ref 0.0–2.0)
ACID-BASE EXCESS: 2 mmol/L (ref 0.0–2.0)
BICARBONATE: 23 mmol/L (ref 20.0–28.0)
Bicarbonate: 21.6 mmol/L (ref 20.0–28.0)
O2 SAT: 100 %
O2 Saturation: 99 %
PH ART: 7.36 (ref 7.350–7.450)
PH ART: 7.61 — AB (ref 7.350–7.450)
TCO2: 24 mmol/L (ref 22–32)
TCO2: 22 mmol/L (ref 22–32)
pCO2 arterial: 40.7 mmHg (ref 32.0–48.0)
pCO2 arterial: 21.5 mmHg — ABNORMAL LOW (ref 32.0–48.0)
pO2, Arterial: 136 mmHg — ABNORMAL HIGH (ref 83.0–108.0)
pO2, Arterial: 195 mmHg — ABNORMAL HIGH (ref 83.0–108.0)

## 2017-06-02 LAB — PREPARE FRESH FROZEN PLASMA
Unit division: 0
Unit division: 0

## 2017-06-02 LAB — BPAM FFP
Blood Product Expiration Date: 201904122359
Blood Product Expiration Date: 201904132359
ISSUE DATE / TIME: 201904071247
ISSUE DATE / TIME: 201904071247
Unit Type and Rh: 6200
Unit Type and Rh: 6200

## 2017-06-02 LAB — HEMOGLOBIN A1C
HEMOGLOBIN A1C: 6.2 % — AB (ref 4.8–5.6)
Mean Plasma Glucose: 131.24 mg/dL

## 2017-06-02 LAB — I-STAT TROPONIN, ED: TROPONIN I, POC: 0 ng/mL (ref 0.00–0.08)

## 2017-06-02 LAB — I-STAT CG4 LACTIC ACID, ED: Lactic Acid, Venous: 1.59 mmol/L (ref 0.5–1.9)

## 2017-06-02 LAB — CDS SEROLOGY

## 2017-06-02 LAB — BRAIN NATRIURETIC PEPTIDE: B Natriuretic Peptide: 647 pg/mL — ABNORMAL HIGH (ref 0.0–100.0)

## 2017-06-02 LAB — ABO/RH: ABO/RH(D): O POS

## 2017-06-02 LAB — ECHOCARDIOGRAM COMPLETE

## 2017-06-02 LAB — TSH: TSH: 1.104 u[IU]/mL (ref 0.350–4.500)

## 2017-06-02 LAB — PROTIME-INR
INR: 1.4
PROTHROMBIN TIME: 17.1 s — AB (ref 11.4–15.2)

## 2017-06-02 MED ORDER — METOPROLOL SUCCINATE ER 50 MG PO TB24
50.0000 mg | ORAL_TABLET | Freq: Every day | ORAL | Status: DC
  Administered 2017-06-03 – 2017-06-06 (×4): 50 mg via ORAL
  Filled 2017-06-02 (×4): qty 1

## 2017-06-02 MED ORDER — METHYLPREDNISOLONE SODIUM SUCC 125 MG IJ SOLR
125.0000 mg | Freq: Once | INTRAMUSCULAR | Status: AC
  Administered 2017-06-02: 125 mg via INTRAVENOUS
  Filled 2017-06-02: qty 2

## 2017-06-02 MED ORDER — ONDANSETRON HCL 4 MG PO TABS
4.0000 mg | ORAL_TABLET | Freq: Four times a day (QID) | ORAL | Status: DC | PRN
  Filled 2017-06-02: qty 1

## 2017-06-02 MED ORDER — ALPRAZOLAM 0.25 MG PO TABS
1.0000 mg | ORAL_TABLET | Freq: Three times a day (TID) | ORAL | Status: DC | PRN
  Administered 2017-06-09: 1 mg via ORAL
  Filled 2017-06-02: qty 4

## 2017-06-02 MED ORDER — ATORVASTATIN CALCIUM 80 MG PO TABS
80.0000 mg | ORAL_TABLET | Freq: Every day | ORAL | Status: DC
  Administered 2017-06-02 – 2017-06-09 (×8): 80 mg via ORAL
  Filled 2017-06-02 (×8): qty 1

## 2017-06-02 MED ORDER — FENTANYL CITRATE (PF) 100 MCG/2ML IJ SOLN
INTRAMUSCULAR | Status: AC
  Filled 2017-06-02: qty 2

## 2017-06-02 MED ORDER — ZOLPIDEM TARTRATE 5 MG PO TABS
5.0000 mg | ORAL_TABLET | Freq: Every evening | ORAL | Status: DC | PRN
  Administered 2017-06-05 – 2017-06-09 (×6): 5 mg via ORAL
  Filled 2017-06-02 (×6): qty 1

## 2017-06-02 MED ORDER — PANTOPRAZOLE SODIUM 40 MG PO TBEC
40.0000 mg | DELAYED_RELEASE_TABLET | Freq: Every day | ORAL | Status: DC
  Administered 2017-06-03 – 2017-06-10 (×8): 40 mg via ORAL
  Filled 2017-06-02 (×8): qty 1

## 2017-06-02 MED ORDER — ADULT MULTIVITAMIN W/MINERALS CH
1.0000 | ORAL_TABLET | Freq: Every day | ORAL | Status: DC
  Administered 2017-06-03 – 2017-06-10 (×7): 1 via ORAL
  Filled 2017-06-02 (×8): qty 1

## 2017-06-02 MED ORDER — FENTANYL CITRATE (PF) 100 MCG/2ML IJ SOLN
INTRAMUSCULAR | Status: AC | PRN
  Administered 2017-06-02: 50 ug via INTRAVENOUS

## 2017-06-02 MED ORDER — FUROSEMIDE 10 MG/ML IJ SOLN: 40.0000 mg | Freq: Every day | INTRAMUSCULAR | Status: DC

## 2017-06-02 MED ORDER — ALBUTEROL SULFATE (2.5 MG/3ML) 0.083% IN NEBU: 2.5000 mg | INHALATION_SOLUTION | Freq: Four times a day (QID) | RESPIRATORY_TRACT | Status: DC | PRN

## 2017-06-02 MED ORDER — LEVALBUTEROL HCL 0.63 MG/3ML IN NEBU
0.6300 mg | INHALATION_SOLUTION | Freq: Four times a day (QID) | RESPIRATORY_TRACT | Status: DC | PRN
Start: 2017-06-02 — End: 2017-06-10

## 2017-06-02 MED ORDER — TETANUS-DIPHTH-ACELL PERTUSSIS 5-2.5-18.5 LF-MCG/0.5 IM SUSP
0.5000 mL | Freq: Once | INTRAMUSCULAR | Status: AC
  Administered 2017-06-02: 0.5 mL via INTRAMUSCULAR
  Filled 2017-06-02 (×2): qty 0.5

## 2017-06-02 MED ORDER — FUROSEMIDE 10 MG/ML IJ SOLN
40.0000 mg | Freq: Two times a day (BID) | INTRAMUSCULAR | Status: DC
  Administered 2017-06-02 – 2017-06-05 (×6): 40 mg via INTRAVENOUS
  Filled 2017-06-02 (×6): qty 4

## 2017-06-02 MED ORDER — ASPIRIN 325 MG PO TABS
325.0000 mg | ORAL_TABLET | Freq: Every day | ORAL | Status: DC
  Administered 2017-06-03 – 2017-06-10 (×8): 325 mg via ORAL
  Filled 2017-06-02 (×8): qty 1

## 2017-06-02 MED ORDER — FLUTICASONE PROPIONATE 50 MCG/ACT NA SUSP
2.0000 | Freq: Every day | NASAL | Status: DC
  Administered 2017-06-02 – 2017-06-10 (×9): 2 via NASAL
  Filled 2017-06-02: qty 16

## 2017-06-02 MED ORDER — ONDANSETRON HCL 4 MG/2ML IJ SOLN
4.0000 mg | Freq: Four times a day (QID) | INTRAMUSCULAR | Status: DC | PRN
  Administered 2017-06-04 (×3): 4 mg via INTRAVENOUS
  Filled 2017-06-02 (×3): qty 2

## 2017-06-02 MED ORDER — MOMETASONE FURO-FORMOTEROL FUM 200-5 MCG/ACT IN AERO
2.0000 | INHALATION_SPRAY | Freq: Two times a day (BID) | RESPIRATORY_TRACT | Status: DC
  Administered 2017-06-03 – 2017-06-10 (×14): 2 via RESPIRATORY_TRACT
  Filled 2017-06-02: qty 8.8

## 2017-06-02 MED ORDER — POTASSIUM CHLORIDE CRYS ER 20 MEQ PO TBCR
20.0000 meq | EXTENDED_RELEASE_TABLET | Freq: Two times a day (BID) | ORAL | Status: DC
  Administered 2017-06-02 – 2017-06-10 (×16): 20 meq via ORAL
  Filled 2017-06-02 (×16): qty 1

## 2017-06-02 MED ORDER — POLYETHYLENE GLYCOL 3350 17 G PO PACK
17.0000 g | PACK | Freq: Every day | ORAL | Status: DC | PRN
  Administered 2017-06-09: 17 g via ORAL
  Filled 2017-06-02: qty 1

## 2017-06-02 MED ORDER — CITALOPRAM HYDROBROMIDE 20 MG PO TABS
20.0000 mg | ORAL_TABLET | Freq: Every day | ORAL | Status: DC
  Administered 2017-06-03 – 2017-06-10 (×8): 20 mg via ORAL
  Filled 2017-06-02 (×5): qty 1
  Filled 2017-06-02: qty 2
  Filled 2017-06-02 (×3): qty 1

## 2017-06-02 MED ORDER — OXYCODONE HCL 5 MG PO TABS: 10.0000 mg | ORAL_TABLET | ORAL | Status: DC | PRN

## 2017-06-02 MED ORDER — PERFLUTREN LIPID MICROSPHERE
1.0000 mL | INTRAVENOUS | Status: AC | PRN
  Administered 2017-06-02: 2 mL via INTRAVENOUS
  Filled 2017-06-02: qty 10

## 2017-06-02 MED ORDER — IOPAMIDOL (ISOVUE-300) INJECTION 61%
INTRAVENOUS | Status: AC
  Administered 2017-06-02: 100 mL
  Filled 2017-06-02: qty 100

## 2017-06-02 MED ORDER — DOCUSATE SODIUM 100 MG PO CAPS
100.0000 mg | ORAL_CAPSULE | Freq: Two times a day (BID) | ORAL | Status: DC
  Administered 2017-06-02 – 2017-06-09 (×14): 100 mg via ORAL
  Filled 2017-06-02 (×16): qty 1

## 2017-06-02 MED ORDER — HYDROMORPHONE HCL 1 MG/ML IJ SOLN
1.0000 mg | INTRAMUSCULAR | Status: DC | PRN
  Administered 2017-06-02 – 2017-06-03 (×2): 1 mg via INTRAVENOUS
  Filled 2017-06-02 (×2): qty 1

## 2017-06-02 MED ORDER — ALBUTEROL SULFATE (2.5 MG/3ML) 0.083% IN NEBU
5.0000 mg | INHALATION_SOLUTION | Freq: Once | RESPIRATORY_TRACT | Status: AC
Start: 2017-06-02 — End: 2017-06-02
  Administered 2017-06-02: 5 mg via RESPIRATORY_TRACT
  Filled 2017-06-02: qty 6

## 2017-06-02 NOTE — ED Notes (Signed)
Upon entering room patient noted to be minimally responsive with distressed respirations, MD Ray to bedside
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
## 2017-06-02 NOTE — Progress Notes (Signed)
  Echocardiogram 2D Echocardiogram has been performed.  Roosvelt MaserLane, Yazaira Speas F 06/02/2017, 5:29 PM

## 2017-06-02 NOTE — ED Notes (Signed)
I stat results given to Dr. Rosalia Hammersay by B. Bing PlumeHaynes, EMT
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
## 2017-06-02 NOTE — Consult Note (Signed)
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 CONTRAST TECHNIQUE: Multidetector CT imaging of the head, cervical spine, and maxillofacial structures were performed using the standard protocol without intravenous contrast. Multiplanar CT image reconstructions of the cervical spine and maxillofacial structures were also generated. COMPARISON:  None. FINDINGS: CT HEAD FINDINGS Brain: Ventricle size normal. Mild white matter hypodensity in the left frontal white matter. Negative for acute infarct, hemorrhage, mass. Vascular: Negative for hyperdense vessel Skull: Negative for skull fracture. Right orbital injury described below. Other: None CT MAXILLOFACIAL FINDINGS Osseous: Depressed fracture of the right orbital floor. Non displaced fracture the right medial orbit. Small amount of gas in the right medial orbit. No other facial fractures identified. Orbits: Severe right orbital injury with extensive soft tissue contusion. Rupture of globe with proptosis due to orbital hemorrhage. Normal left orbit Sinuses: Paranasal sinuses clear.  No air-fluid levels. Soft tissues: Extensive contusion to the soft tissues in the right face and orbit CT CERVICAL SPINE FINDINGS  Alignment: Mild anterolisthesis C3-4 and C4-5. Skull base and vertebrae: Negative for cervical spine fracture Soft tissues and spinal canal: Negative Disc levels: Multilevel disc and facet degeneration throughout the cervical spine. Multilevel spinal and foraminal stenosis due to spurring. Upper chest: Pacemaker on the right. Right upper lobe infiltrate suggesting aspiration. Other: None IMPRESSION: 1. No acute intracranial abnormality 2. Extensive contusion to the right orbit and face. Right orbital rupture hematoma with proptosis. Fracture of the  right orbital floor and medial orbit. No other facial fracture 3. Negative for cervical spine fracture. Moderate to advanced spondylosis 4. Right upper lobe infiltrate compatible with aspiration. 5. The images were reviewed with Dr. Lindie Spruce at the time of interpretation. Electronically Signed   By: Marlan Palau M.D.   On: 06/02/2017 14:04    Assessment and Plan:   1. Syncope: unclear cause. Has had multiple episodes over the last week. Did have ICD shocks in the past but none today. Interrogation shows atrial fibrillation. Would continue to monitor on telemetry. Plan to get TTE. Troponin negative initially to cardiac contusion less likely. May benefit from adjustment in ICD settings. 2. Atrial fibrillation: at this point, with ongoing managemnt, agree with holding anticoagulation. Would restart once surgical issues are corrected. 3. Ischemic cardiomyopathy: device interrogation shows volume overload. Would continue with home meds if possible and agree with IV lasix. Would give 40 mg BID.   For questions or updates, please contact [facility] Please consult [email or web address] for contact info under Cardiology/STEMI.   Signed, Viola Kinnick Jorja Loa, MD  06/02/2017 4:29 PM

## 2017-06-02 NOTE — Progress Notes (Signed)
   06/02/17 1400  Clinical Encounter Type  Visited With Patient;Health care provider  Visit Type ED;Trauma;Spiritual support  Referral From Nurse  Consult/Referral To Chaplain  Spiritual Encounters  Spiritual Needs Emotional   Responded to a Level II that was upgraded to a Level 1.  Let Dr. Lindie SpruceWyatt know that patient has a sister and we have contact information for her.  Patient was assessed and I was able to be bedside to provide comfort and support.  Patient went to CT and I was able to come back and provide some additional support.  Patient very appreciative of the care he has received so far.  Will follow and support as needed. Chaplain Agustin CreeNewton Avnoor Koury
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
## 2017-06-02 NOTE — Progress Notes (Signed)
L-spine transverse fractures confirmed by dedicated lumbar CT.  Patient continues to be asymptomatic.  Left forearm X-rays negative.  Marta Lamas. Gae Bon, MD, FACS [phone] Trauma Surgeon
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
## 2017-06-02 NOTE — ED Notes (Signed)
438 mL bladder scan.

## 2017-06-02 NOTE — ED Notes (Signed)
Attempted to trial patient on nasal cannula, pt sats up to only 85% on 6L nasal cannula. NRB replaced. MD Ray aware.
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
## 2017-06-02 NOTE — ED Notes (Signed)
PT medtronic pacemaker has been interrogated.

## 2017-06-03 ENCOUNTER — Inpatient Hospital Stay (HOSPITAL_COMMUNITY): Payer: Medicare HMO

## 2017-06-03 DIAGNOSIS — S32029A Unspecified fracture of second lumbar vertebra, initial encounter for closed fracture: Secondary | ICD-10-CM

## 2017-06-03 DIAGNOSIS — S2249XA Multiple fractures of ribs, unspecified side, initial encounter for closed fracture: Secondary | ICD-10-CM

## 2017-06-03 DIAGNOSIS — S299XXA Unspecified injury of thorax, initial encounter: Secondary | ICD-10-CM

## 2017-06-03 DIAGNOSIS — T07XXXA Unspecified multiple injuries, initial encounter: Secondary | ICD-10-CM

## 2017-06-03 DIAGNOSIS — R4182 Altered mental status, unspecified: Secondary | ICD-10-CM

## 2017-06-03 DIAGNOSIS — S0231XA Fracture of orbital floor, right side, initial encounter for closed fracture: Secondary | ICD-10-CM

## 2017-06-03 DIAGNOSIS — I2510 Atherosclerotic heart disease of native coronary artery without angina pectoris: Secondary | ICD-10-CM

## 2017-06-03 DIAGNOSIS — D72829 Elevated white blood cell count, unspecified: Secondary | ICD-10-CM

## 2017-06-03 DIAGNOSIS — S0531XA Ocular laceration without prolapse or loss of intraocular tissue, right eye, initial encounter: Secondary | ICD-10-CM

## 2017-06-03 DIAGNOSIS — I481 Persistent atrial fibrillation: Secondary | ICD-10-CM

## 2017-06-03 DIAGNOSIS — R55 Syncope and collapse: Secondary | ICD-10-CM

## 2017-06-03 DIAGNOSIS — S32019A Unspecified fracture of first lumbar vertebra, initial encounter for closed fracture: Principal | ICD-10-CM

## 2017-06-03 LAB — COMPREHENSIVE METABOLIC PANEL
ALBUMIN: 3.3 g/dL — AB (ref 3.5–5.0)
ALT: 33 U/L (ref 17–63)
AST: 43 U/L — AB (ref 15–41)
Alkaline Phosphatase: 71 U/L (ref 38–126)
Anion gap: 12 (ref 5–15)
BILIRUBIN TOTAL: 1.1 mg/dL (ref 0.3–1.2)
BUN: 15 mg/dL (ref 6–20)
CO2: 26 mmol/L (ref 22–32)
Calcium: 8.4 mg/dL — ABNORMAL LOW (ref 8.9–10.3)
Chloride: 100 mmol/L — ABNORMAL LOW (ref 101–111)
Creatinine, Ser: 1 mg/dL (ref 0.61–1.24)
GFR calc Af Amer: >60 mL/min (ref >60)
GFR calc non Af Amer: >60 mL/min (ref >60)
GLUCOSE: 126 mg/dL — AB (ref 65–99)
POTASSIUM: 4.1 mmol/L (ref 3.5–5.1)
SODIUM: 138 mmol/L (ref 135–145)
TOTAL PROTEIN: 6.4 g/dL — AB (ref 6.5–8.1)

## 2017-06-03 LAB — TYPE AND SCREEN
ABO/RH(D): O POS
ANTIBODY SCREEN: NEGATIVE
Unit division: 0
Unit division: 0

## 2017-06-03 LAB — TROPONIN I
Troponin I: <0.03 ng/mL (ref <0.03)
Troponin I: <0.03 ng/mL (ref <0.03)

## 2017-06-03 LAB — URINALYSIS, ROUTINE W REFLEX MICROSCOPIC
BACTERIA UA: NONE SEEN
BILIRUBIN URINE: NEGATIVE
Glucose, UA: NEGATIVE mg/dL
KETONES UR: NEGATIVE mg/dL
NITRITE: NEGATIVE
PROTEIN: NEGATIVE mg/dL
SPECIFIC GRAVITY, URINE: 1.008 (ref 1.005–1.030)
SQUAMOUS EPITHELIAL / LPF: NONE SEEN
pH: 6 (ref 5.0–8.0)

## 2017-06-03 LAB — BPAM RBC
BLOOD PRODUCT EXPIRATION DATE: 201904072359
BLOOD PRODUCT EXPIRATION DATE: 201904082359
ISSUE DATE / TIME: 201904071429
ISSUE DATE / TIME: 201904072229
Unit Type and Rh: 9500
Unit Type and Rh: 9500

## 2017-06-03 LAB — GLUCOSE, CAPILLARY
GLUCOSE-CAPILLARY: 121 mg/dL — AB (ref 65–99)
Glucose-Capillary: 120 mg/dL — ABNORMAL HIGH (ref 65–99)
Glucose-Capillary: 121 mg/dL — ABNORMAL HIGH (ref 65–99)

## 2017-06-03 LAB — PROTIME-INR
INR: 1.32
Prothrombin Time: 16.3 seconds — ABNORMAL HIGH (ref 11.4–15.2)

## 2017-06-03 LAB — CBC
HEMATOCRIT: 39.5 % (ref 39.0–52.0)
Hemoglobin: 11.9 g/dL — ABNORMAL LOW (ref 13.0–17.0)
MCH: 26.5 pg (ref 26.0–34.0)
MCHC: 30.1 g/dL (ref 30.0–36.0)
MCV: 88 fL (ref 78.0–100.0)
Platelets: 279 10*3/uL (ref 150–400)
RBC: 4.49 MIL/uL (ref 4.22–5.81)
RDW: 15.5 % (ref 11.5–15.5)
WBC: 19.3 10*3/uL — ABNORMAL HIGH (ref 4.0–10.5)

## 2017-06-03 LAB — HIV ANTIBODY (ROUTINE TESTING W REFLEX): HIV Screen 4th Generation wRfx: NONREACTIVE

## 2017-06-03 MED ORDER — OXYCODONE HCL 5 MG PO TABS
5.0000 mg | ORAL_TABLET | ORAL | Status: DC | PRN
  Administered 2017-06-03 – 2017-06-10 (×28): 10 mg via ORAL
  Administered 2017-06-10: 5 mg via ORAL
  Filled 2017-06-03 (×28): qty 2
  Filled 2017-06-03: qty 1

## 2017-06-03 MED ORDER — ACETAMINOPHEN 325 MG PO TABS
650.0000 mg | ORAL_TABLET | Freq: Three times a day (TID) | ORAL | Status: DC
  Administered 2017-06-03 – 2017-06-10 (×17): 650 mg via ORAL
  Filled 2017-06-03 (×20): qty 2

## 2017-06-03 MED ORDER — LEVETIRACETAM 250 MG PO TABS: 250.0000 mg | ORAL_TABLET | Freq: Two times a day (BID) | ORAL | Status: DC

## 2017-06-03 MED ORDER — HYDROMORPHONE HCL 1 MG/ML IJ SOLN
1.0000 mg | INTRAMUSCULAR | Status: DC | PRN
  Administered 2017-06-03: 1 mg via INTRAVENOUS
  Filled 2017-06-03: qty 1

## 2017-06-03 MED ORDER — LEVETIRACETAM 500 MG PO TABS: 500.0000 mg | ORAL_TABLET | Freq: Two times a day (BID) | ORAL | Status: DC

## 2017-06-03 MED ORDER — INSULIN ASPART 100 UNIT/ML ~~LOC~~ SOLN
0.0000 [IU] | Freq: Three times a day (TID) | SUBCUTANEOUS | Status: DC
  Administered 2017-06-03 – 2017-06-05 (×3): 1 [IU] via SUBCUTANEOUS
  Administered 2017-06-05: 2 [IU] via SUBCUTANEOUS
  Administered 2017-06-06 – 2017-06-09 (×4): 1 [IU] via SUBCUTANEOUS

## 2017-06-03 MED ORDER — LEVETIRACETAM 500 MG PO TABS
1000.0000 mg | ORAL_TABLET | Freq: Once | ORAL | Status: AC
  Administered 2017-06-03: 1000 mg via ORAL
  Filled 2017-06-03: qty 2

## 2017-06-03 MED ORDER — METHOCARBAMOL 500 MG PO TABS: 500.0000 mg | ORAL_TABLET | Freq: Three times a day (TID) | ORAL | Status: DC | PRN
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
## 2017-06-03 NOTE — Progress Notes (Signed)
EEG completed; results pending.    
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
## 2017-06-03 NOTE — Plan of Care (Signed)
  Problem: Education: Goal: Knowledge of General Education information will improve Outcome: Progressing   Problem: Health Behavior/Discharge Planning: Goal: Ability to manage health-related needs will improve Outcome: Progressing   Problem: Clinical Measurements: Goal: Ability to maintain clinical measurements within normal limits will improve Outcome: Progressing Goal: Will remain free from infection Outcome: Progressing Goal: Diagnostic test results will improve Outcome: Progressing Goal: Respiratory complications will improve Outcome: Progressing Goal: Cardiovascular complication will be avoided Outcome: Progressing   Problem: Activity: Goal: Risk for activity intolerance will decrease Outcome: Progressing   Problem: Elimination: Goal: Will not experience complications related to bowel motility Outcome: Progressing Goal: Will not experience complications related to urinary retention Outcome: Progressing   Problem: Pain Managment: Goal: General experience of comfort will improve Outcome: Progressing   Problem: Safety: Goal: Ability to remain free from injury will improve Outcome: Progressing   Problem: Skin Integrity: Goal: Risk for impaired skin integrity will decrease Outcome: Progressing
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
## 2017-06-03 NOTE — Progress Notes (Addendum)
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 mobility (it has been ordered). Continue syncopal workup per primary.  Plan outpatient referral to [facility] for treatment of ruptured right globe.   LOS: 1 day    Franne Forts , [facility] 06/03/2017, 8:13 AM Pager: [phone] Consults: [phone] Mon-Fri 7:00 am-4:30 pm Sat-Sun 7:00 am-11:30 am

## 2017-06-03 NOTE — Progress Notes (Signed)
Orthopedic Tech Progress Note Patient Details:  Douglas PaganiniRobert G Meals 01/15/1956 [id number]  Patient ID: Douglas Paganiniobert G Gassert, male   DOB: 04/29/1955, 62 y.o.   MRN: [id number]   Nikki DomCrawford, Natnael Biederman 06/03/2017, 9:07 AM Called in bio-tech brace order; spoke with Wylene MenLacey

## 2017-06-04 ENCOUNTER — Other Ambulatory Visit: Payer: Self-pay

## 2017-06-04 ENCOUNTER — Encounter (HOSPITAL_COMMUNITY): Payer: Self-pay

## 2017-06-04 DIAGNOSIS — R4020 Unspecified coma: Secondary | ICD-10-CM

## 2017-06-04 DIAGNOSIS — I5043 Acute on chronic combined systolic (congestive) and diastolic (congestive) heart failure: Secondary | ICD-10-CM

## 2017-06-04 LAB — BASIC METABOLIC PANEL
Anion gap: 10 (ref 5–15)
BUN: 13 mg/dL (ref 6–20)
CALCIUM: 8.7 mg/dL — AB (ref 8.9–10.3)
CO2: 27 mmol/L (ref 22–32)
CREATININE: 0.82 mg/dL (ref 0.61–1.24)
Chloride: 99 mmol/L — ABNORMAL LOW (ref 101–111)
GFR calc non Af Amer: >60 mL/min (ref >60)
Glucose, Bld: 113 mg/dL — ABNORMAL HIGH (ref 65–99)
Potassium: 3.8 mmol/L (ref 3.5–5.1)
Sodium: 136 mmol/L (ref 135–145)

## 2017-06-04 LAB — CBC
HEMATOCRIT: 41.2 % (ref 39.0–52.0)
Hemoglobin: 12.7 g/dL — ABNORMAL LOW (ref 13.0–17.0)
MCH: 27.2 pg (ref 26.0–34.0)
MCHC: 30.8 g/dL (ref 30.0–36.0)
MCV: 88.2 fL (ref 78.0–100.0)
Platelets: 293 10*3/uL (ref 150–400)
RBC: 4.67 MIL/uL (ref 4.22–5.81)
RDW: 15.7 % — AB (ref 11.5–15.5)
WBC: 18.2 10*3/uL — ABNORMAL HIGH (ref 4.0–10.5)

## 2017-06-04 LAB — GLUCOSE, CAPILLARY
GLUCOSE-CAPILLARY: 127 mg/dL — AB (ref 65–99)
GLUCOSE-CAPILLARY: 159 mg/dL — AB (ref 65–99)
Glucose-Capillary: 112 mg/dL — ABNORMAL HIGH (ref 65–99)
Glucose-Capillary: 116 mg/dL — ABNORMAL HIGH (ref 65–99)

## 2017-06-04 MED ORDER — POTASSIUM CHLORIDE CRYS ER 20 MEQ PO TBCR
20.0000 meq | EXTENDED_RELEASE_TABLET | Freq: Once | ORAL | Status: AC
  Administered 2017-06-04: 20 meq via ORAL
  Filled 2017-06-04: qty 1
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
## 2017-06-04 NOTE — Plan of Care (Signed)
  Problem: Education: Goal: Knowledge of General Education information will improve Outcome: Progressing   Problem: Health Behavior/Discharge Planning: Goal: Ability to manage health-related needs will improve Outcome: Progressing   Problem: Clinical Measurements: Goal: Respiratory complications will improve Outcome: Progressing   Problem: Pain Managment: Goal: General experience of comfort will improve Outcome: Progressing   Problem: Elimination: Goal: Will not experience complications related to urinary retention Outcome: Not Progressing
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
## 2017-06-05 LAB — BASIC METABOLIC PANEL
ANION GAP: 11 (ref 5–15)
BUN: 14 mg/dL (ref 6–20)
CO2: 32 mmol/L (ref 22–32)
Calcium: 8.8 mg/dL — ABNORMAL LOW (ref 8.9–10.3)
Chloride: 96 mmol/L — ABNORMAL LOW (ref 101–111)
Creatinine, Ser: 0.95 mg/dL (ref 0.61–1.24)
GFR calc Af Amer: >60 mL/min (ref >60)
GLUCOSE: 110 mg/dL — AB (ref 65–99)
POTASSIUM: 4.8 mmol/L (ref 3.5–5.1)
Sodium: 139 mmol/L (ref 135–145)

## 2017-06-05 LAB — GLUCOSE, CAPILLARY
Glucose-Capillary: 117 mg/dL — ABNORMAL HIGH (ref 65–99)
Glucose-Capillary: 163 mg/dL — ABNORMAL HIGH (ref 65–99)
Glucose-Capillary: 139 mg/dL — ABNORMAL HIGH (ref 65–99)

## 2017-06-05 MED ORDER — TAMSULOSIN HCL 0.4 MG PO CAPS
0.4000 mg | ORAL_CAPSULE | Freq: Every day | ORAL | Status: DC
  Administered 2017-06-05 – 2017-06-10 (×6): 0.4 mg via ORAL
  Filled 2017-06-05 (×6): qty 1

## 2017-06-05 MED ORDER — ALUM & MAG HYDROXIDE-SIMETH 200-200-20 MG/5ML PO SUSP
15.0000 mL | ORAL | Status: DC | PRN
  Administered 2017-06-05: 15 mL via ORAL
  Filled 2017-06-05: qty 30

## 2017-06-05 MED ORDER — BETHANECHOL CHLORIDE 10 MG PO TABS
10.0000 mg | ORAL_TABLET | Freq: Three times a day (TID) | ORAL | Status: DC
  Administered 2017-06-05 – 2017-06-10 (×15): 10 mg via ORAL
  Filled 2017-06-05 (×18): qty 1

## 2017-06-05 MED ORDER — TORSEMIDE 20 MG PO TABS
40.0000 mg | ORAL_TABLET | Freq: Every day | ORAL | Status: DC
Start: 2017-06-05 — End: 2017-06-10
  Administered 2017-06-05 – 2017-06-10 (×6): 40 mg via ORAL
  Filled 2017-06-05 (×6): qty 2
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
## 2017-06-06 DIAGNOSIS — S0281XA Fracture of other specified skull and facial bones, right side, initial encounter for closed fracture: Secondary | ICD-10-CM

## 2017-06-06 DIAGNOSIS — J9621 Acute and chronic respiratory failure with hypoxia: Secondary | ICD-10-CM

## 2017-06-06 LAB — BASIC METABOLIC PANEL
Anion gap: 9 (ref 5–15)
BUN: 12 mg/dL (ref 6–20)
CHLORIDE: 96 mmol/L — AB (ref 101–111)
CO2: 35 mmol/L — ABNORMAL HIGH (ref 22–32)
CREATININE: 1.06 mg/dL (ref 0.61–1.24)
Calcium: 8.8 mg/dL — ABNORMAL LOW (ref 8.9–10.3)
GFR calc Af Amer: >60 mL/min (ref >60)
GFR calc non Af Amer: >60 mL/min (ref >60)
GLUCOSE: 143 mg/dL — AB (ref 65–99)
Potassium: 4.5 mmol/L (ref 3.5–5.1)
SODIUM: 140 mmol/L (ref 135–145)

## 2017-06-06 LAB — GLUCOSE, CAPILLARY
GLUCOSE-CAPILLARY: 139 mg/dL — AB (ref 65–99)
Glucose-Capillary: 124 mg/dL — ABNORMAL HIGH (ref 65–99)
Glucose-Capillary: 148 mg/dL — ABNORMAL HIGH (ref 65–99)
Glucose-Capillary: 123 mg/dL — ABNORMAL HIGH (ref 65–99)

## 2017-06-06 LAB — MAGNESIUM: MAGNESIUM: 1.8 mg/dL (ref 1.7–2.4)

## 2017-06-06 MED ORDER — METOPROLOL TARTRATE 50 MG PO TABS
50.0000 mg | ORAL_TABLET | Freq: Once | ORAL | Status: AC
  Administered 2017-06-06: 50 mg via ORAL
  Filled 2017-06-06: qty 1

## 2017-06-06 MED ORDER — METOPROLOL SUCCINATE ER 100 MG PO TB24
100.0000 mg | ORAL_TABLET | Freq: Every day | ORAL | Status: DC
  Administered 2017-06-07 – 2017-06-10 (×4): 100 mg via ORAL
  Filled 2017-06-06 (×4): qty 1

## 2017-06-06 NOTE — Progress Notes (Addendum)
At 23:48, pt ran 41 beats of vtach. Central telemetry called and informed. When entering the room, pt converted back to a fib. Pt's VSS. Pt was resting in bed. Cardiology was paged. Will continue to monitor.   Judithann SheenJuhi Littie Chiem, RN
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
## 2017-06-06 NOTE — Care Management Important Message (Signed)
Important Message  Patient Details  Name: Trellis PaganiniRobert G Vonstein MRN: [id number] Date of Birth: 01/14/1956   Medicare Important Message Given:  Yes    Tashae Inda P Sheneka Schrom 06/06/2017, 2:05 PM

## 2017-06-07 LAB — GLUCOSE, CAPILLARY
GLUCOSE-CAPILLARY: 120 mg/dL — AB (ref 65–99)
GLUCOSE-CAPILLARY: 152 mg/dL — AB (ref 65–99)
Glucose-Capillary: 104 mg/dL — ABNORMAL HIGH (ref 65–99)
Glucose-Capillary: 137 mg/dL — ABNORMAL HIGH (ref 65–99)

## 2017-06-07 LAB — BASIC METABOLIC PANEL
ANION GAP: 12 (ref 5–15)
BUN: 14 mg/dL (ref 6–20)
CHLORIDE: 95 mmol/L — AB (ref 101–111)
CO2: 33 mmol/L — AB (ref 22–32)
Calcium: 9 mg/dL (ref 8.9–10.3)
Creatinine, Ser: 0.98 mg/dL (ref 0.61–1.24)
GFR calc Af Amer: >60 mL/min (ref >60)
GLUCOSE: 121 mg/dL — AB (ref 65–99)
POTASSIUM: 4.2 mmol/L (ref 3.5–5.1)
Sodium: 140 mmol/L (ref 135–145)
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
## 2017-06-08 LAB — GLUCOSE, CAPILLARY
GLUCOSE-CAPILLARY: 118 mg/dL — AB (ref 65–99)
Glucose-Capillary: 113 mg/dL — ABNORMAL HIGH (ref 65–99)
Glucose-Capillary: 141 mg/dL — ABNORMAL HIGH (ref 65–99)
Glucose-Capillary: 118 mg/dL — ABNORMAL HIGH (ref 65–99)

## 2017-06-08 LAB — BASIC METABOLIC PANEL
Anion gap: 12 (ref 5–15)
BUN: 15 mg/dL (ref 6–20)
CALCIUM: 9 mg/dL (ref 8.9–10.3)
CO2: 30 mmol/L (ref 22–32)
Chloride: 95 mmol/L — ABNORMAL LOW (ref 101–111)
Creatinine, Ser: 1.03 mg/dL (ref 0.61–1.24)
GFR calc Af Amer: >60 mL/min (ref >60)
Glucose, Bld: 106 mg/dL — ABNORMAL HIGH (ref 65–99)
POTASSIUM: 4 mmol/L (ref 3.5–5.1)
SODIUM: 137 mmol/L (ref 135–145)
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
## 2017-06-08 NOTE — Plan of Care (Signed)
  Problem: Education: Goal: Knowledge of General Education information will improve Outcome: Progressing   Problem: Health Behavior/Discharge Planning: Goal: Ability to manage health-related needs will improve Outcome: Progressing   Problem: Clinical Measurements: Goal: Ability to maintain clinical measurements within normal limits will improve Outcome: Progressing   

## 2017-06-09 LAB — GLUCOSE, CAPILLARY
GLUCOSE-CAPILLARY: 115 mg/dL — AB (ref 65–99)
GLUCOSE-CAPILLARY: 137 mg/dL — AB (ref 65–99)
Glucose-Capillary: 138 mg/dL — ABNORMAL HIGH (ref 65–99)
Glucose-Capillary: 115 mg/dL — ABNORMAL HIGH (ref 65–99)

## 2017-06-09 NOTE — Plan of Care (Signed)
  Problem: Education: Goal: Knowledge of General Education information will improve Outcome: Progressing   Problem: Health Behavior/Discharge Planning: Goal: Ability to manage health-related needs will improve Outcome: Progressing   Problem: Clinical Measurements: Goal: Ability to maintain clinical measurements within normal limits will improve Outcome: Progressing   
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
## 2017-06-10 DIAGNOSIS — I5082 Biventricular heart failure: Secondary | ICD-10-CM | POA: Insufficient documentation

## 2017-06-10 DIAGNOSIS — S2241XA Multiple fractures of ribs, right side, initial encounter for closed fracture: Secondary | ICD-10-CM

## 2017-06-10 DIAGNOSIS — E870 Hyperosmolality and hypernatremia: Secondary | ICD-10-CM | POA: Insufficient documentation

## 2017-06-10 DIAGNOSIS — R262 Difficulty in walking, not elsewhere classified: Secondary | ICD-10-CM | POA: Insufficient documentation

## 2017-06-10 DIAGNOSIS — F330 Major depressive disorder, recurrent, mild: Secondary | ICD-10-CM | POA: Insufficient documentation

## 2017-06-10 DIAGNOSIS — Z950 Presence of cardiac pacemaker: Secondary | ICD-10-CM

## 2017-06-10 DIAGNOSIS — M6281 Muscle weakness (generalized): Secondary | ICD-10-CM | POA: Insufficient documentation

## 2017-06-10 DIAGNOSIS — N320 Bladder-neck obstruction: Secondary | ICD-10-CM | POA: Insufficient documentation

## 2017-06-10 DIAGNOSIS — S2249XA Multiple fractures of ribs, unspecified side, initial encounter for closed fracture: Secondary | ICD-10-CM | POA: Insufficient documentation

## 2017-06-10 DIAGNOSIS — J4520 Mild intermittent asthma, uncomplicated: Secondary | ICD-10-CM | POA: Insufficient documentation

## 2017-06-10 LAB — GLUCOSE, CAPILLARY
GLUCOSE-CAPILLARY: 111 mg/dL — AB (ref 65–99)
GLUCOSE-CAPILLARY: 141 mg/dL — AB (ref 65–99)

## 2017-06-10 MED ORDER — METOPROLOL SUCCINATE ER 100 MG PO TB24: 100.0000 mg | ORAL_TABLET | Freq: Every day | ORAL | 0 refills | Status: DC

## 2017-06-10 MED ORDER — POLYETHYLENE GLYCOL 3350 17 G PO PACK: 17.0000 g | PACK | Freq: Every day | ORAL | 0 refills | Status: DC | PRN

## 2017-06-10 MED ORDER — DOCUSATE SODIUM 100 MG PO CAPS: 100.0000 mg | ORAL_CAPSULE | Freq: Two times a day (BID) | ORAL | 0 refills | Status: DC

## 2017-06-10 MED ORDER — OXYCODONE HCL 5 MG PO TABS: 5.0000 mg | ORAL_TABLET | ORAL | 0 refills | Status: DC | PRN

## 2017-06-10 MED ORDER — TORSEMIDE 20 MG PO TABS: 40.0000 mg | ORAL_TABLET | Freq: Every day | ORAL | 6 refills | Status: DC

## 2017-06-10 MED ORDER — ACETAMINOPHEN 500 MG PO TABS: 500.0000 mg | ORAL_TABLET | Freq: Four times a day (QID) | ORAL | 0 refills | Status: DC | PRN

## 2017-06-10 MED ORDER — METHOCARBAMOL 500 MG PO TABS: 500.0000 mg | ORAL_TABLET | Freq: Three times a day (TID) | ORAL | 0 refills | Status: DC | PRN

## 2017-06-10 MED ORDER — BETHANECHOL CHLORIDE 10 MG PO TABS: 10.0000 mg | ORAL_TABLET | Freq: Three times a day (TID) | ORAL | 0 refills | Status: DC

## 2017-06-10 MED ORDER — TAMSULOSIN HCL 0.4 MG PO CAPS: 0.4000 mg | ORAL_CAPSULE | Freq: Every day | ORAL | 6 refills | Status: DC

## 2017-06-10 MED ORDER — ASPIRIN 325 MG PO TABS: 325.0000 mg | ORAL_TABLET | Freq: Every day | ORAL | 0 refills | Status: DC
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
## 2017-06-10 NOTE — Progress Notes (Signed)
Clinical Social Worker facilitated patient discharge including contacting patient family and facility to confirm patient discharge plans.  Clinical information faxed to facility and family agreeable with plan.  CSW arranged ambulance transport via [facility] to [facility] .  RN to call [phone] (pt will go in room 101) for report prior to discharge.  Clinical Social Worker will sign off for now as social work intervention is no longer needed. Please consult us again if new need arises.  Mazi Brailsford, MSW, LCSWA [phone]  

## 2017-06-10 NOTE — Progress Notes (Signed)
LOS: 8 days   Subjective: [name] is doing well today. He is ambulating, tolerating solid food, urinating and defecating normally. He reports some soreness in his back and chest wall. He is awaiting SNF placement.   Objective: Vital signs in last 24 hours: Temp:  [97.4 F (36.3 C)-98.1 F (36.7 C)] 97.6 F (36.4 C) (04/15 0820) Pulse Rate:  [81-107] 81 (04/14 1633) Resp:  [14-23] 14 (04/15 0820) BP: (89-129)/(64-91) 100/74 (04/15 0820) SpO2:  [92 %-98 %] 96 % (04/15 0820) Weight:  [111.8 kg (246 lb 6.4 oz)] 111.8 kg (246 lb 6.4 oz) (04/15 0430) Last BM Date: 06/10/17   Laboratory  CBC No results for input(s): WBC, HGB, HCT, PLT in the last 72 hours. BMET Recent Labs    06/08/17 0245  NA 137  K 4.0  CL 95*  CO2 30  GLUCOSE 106*  BUN 15  CREATININE 1.03  CALCIUM 9.0     Physical Exam General appearance: alert and cooperative Resp: clear to auscultation bilaterally Cardio: regular rate and rhythm   Assessment/Plan:  [name] is a 62yo M with a right orbital blow out fx  - appointment made to see occuloplastics at [facility] this week. Has transportation for this visit.  - pending SNF placement   Syncopy - managed by internal medicine   A.Fib, CHF, CAD, - managed by electrophysiology, cardiology, and internal medicine  Douglas [name]    General Trauma PA pager [phone]  06/10/2017

## 2017-06-10 NOTE — Care Management Important Message (Signed)
Important Message  Patient Details  Name: Douglas PaganiniRobert G [name] MRN: [id number] Date of Birth: 03/04/1955   Medicare Important Message Given:  Yes    [name] [name] 06/10/2017, 1:41 PM

## 2017-06-10 NOTE — Progress Notes (Signed)
[facility] No questions at this time. Will continue to monitor.
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
## 2017-06-10 NOTE — Clinical Social Work Placement (Signed)
   CLINICAL SOCIAL WORK PLACEMENT  NOTE  Date:  06/10/2017  Patient Details  Name: Douglas [name] MRN: [id number] Date of Birth: 10/23/1955  Clinical Social Work is seeking post-discharge placement for this patient at the Skilled  Nursing Facility level of care (*CSW will initial, date and re-position this form in  chart as items are completed):      Patient/family provided with [facility] Clinical [facility] Work Department's list of facilities offering this level of care within the geographic area requested by the patient (or if unable, by the patient's family).  Yes   Patient/family informed of their freedom to choose among providers that offer the needed level of care, that participate in Medicare, Medicaid or managed care program needed by the patient, have an available bed and are willing to accept the patient.      Patient/family informed of [facility]'s ownership interest in [facility] and [facility], as well as of the fact that they are under no obligation to receive care at these facilities.  PASRR submitted to EDS on       PASRR number received on       Existing PASRR number confirmed on       FL2 transmitted to all facilities in geographic area requested by pt/family on       FL2 transmitted to all facilities within larger geographic area on       Patient informed that his/her managed care company has contracts with or will negotiate with certain facilities, including the following:            Patient/family informed of bed offers received.  [facility]     Physician recommends and patient chooses bed at      [facility].  Patient to be transferred to facility by ptar     Patient family notified on 06/10/17 of transfer.  Name of family member notified:  sister is aware of dc     PHYSICIAN       Additional Comment:    _______________________________________________ Althea CharonAshley  C [place], LCSW 06/10/2017, 2:52 PM
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
## 2017-06-11 ENCOUNTER — Encounter (HOSPITAL_COMMUNITY): Payer: Self-pay | Admitting: Emergency Medicine

## 2017-06-11 DIAGNOSIS — S0520XA Ocular laceration and rupture with prolapse or loss of intraocular tissue, unspecified eye, initial encounter: Secondary | ICD-10-CM | POA: Insufficient documentation

## 2017-06-19 ENCOUNTER — Telehealth: Payer: Self-pay | Admitting: Cardiology
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
## 2017-06-19 NOTE — Telephone Encounter (Signed)
Left message to call back  

## 2017-06-19 NOTE — Telephone Encounter (Signed)
New Message:       Pt's wife returning call

## 2017-06-19 NOTE — Telephone Encounter (Signed)
Spoke to Ms Zachery DauerBarnes, she wanted to inform Dr Delrae SawyersHOCHREN that [facility]. She states Dr Delrae SawyersHochren can access information. She staes that they would keep  Office appointment on may 13 ,2019.  Ms Zachery DauerBarnes is aware RN will send information to Dr Antoine PocheHochrein  For review - ( not in office this week)
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
## 2017-07-08 ENCOUNTER — Ambulatory Visit: Payer: Medicare HMO | Admitting: Cardiology
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
## 2017-07-12 ENCOUNTER — Ambulatory Visit: Payer: Medicare HMO | Admitting: Cardiology

## 2017-07-29 ENCOUNTER — Telehealth: Payer: Self-pay | Admitting: Cardiology

## 2017-07-29 ENCOUNTER — Ambulatory Visit (INDEPENDENT_AMBULATORY_CARE_PROVIDER_SITE_OTHER): Payer: Medicare HMO | Admitting: *Deleted

## 2017-07-29 DIAGNOSIS — I472 Ventricular tachycardia, unspecified: Secondary | ICD-10-CM

## 2017-07-29 NOTE — Telephone Encounter (Signed)
LMOVM reminding pt to send remote transmission.   

## 2017-07-30 ENCOUNTER — Telehealth: Payer: Self-pay
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
## 2017-07-30 NOTE — Telephone Encounter (Signed)
Left message for pt to call device clinic back to make apt for a defib check to change ICD therapy detection rate back to parameters from 12/2016, [facility] pt received inappropriate ATP for AF w/ RVR.

## 2017-07-30 NOTE — Progress Notes (Signed)
Remote ICD transmission.   

## 2017-07-31 ENCOUNTER — Encounter: Payer: Self-pay | Admitting: Cardiology

## 2017-08-02 ENCOUNTER — Telehealth: Payer: Self-pay

## 2017-08-02 NOTE — Telephone Encounter (Signed)
Spoke with pt regarding Dr. Lubertha Basqueaylor's recommendations to change ICD detection back to parameters set by [facility], pt voiced understanding pt agreeable to apt on 6/20@3 :30pm, pt could not come before then d/t he relies on his sister for transportation.

## 2017-08-08 LAB — CUP PACEART REMOTE DEVICE CHECK
Brady Statistic AP VS Percent: 0.39 %
Brady Statistic RA Percent Paced: 0.47 %
Brady Statistic RV Percent Paced: 2.43 %
Date Time Interrogation Session: 20190604002640
HIGH POWER IMPEDANCE MEASURED VALUE: 456 Ohm
HighPow Impedance: 69 Ohm
Implantable Lead Implant Date: 20060116
Implantable Lead Location: 753859
Implantable Lead Model: 5076
Implantable Lead Model: 6935
Lead Channel Impedance Value: 551 Ohm
Lead Channel Pacing Threshold Pulse Width: 0.4 ms
Lead Channel Sensing Intrinsic Amplitude: 0.25 mV
Lead Channel Sensing Intrinsic Amplitude: 0.25 mV
MDC IDC LEAD IMPLANT DT: 20130715
MDC IDC LEAD LOCATION: 753860
MDC IDC MSMT BATTERY VOLTAGE: 2.77 V
MDC IDC MSMT LEADCHNL RA IMPEDANCE VALUE: 874 Ohm
MDC IDC MSMT LEADCHNL RA PACING THRESHOLD AMPLITUDE: 2.5 V
MDC IDC MSMT LEADCHNL RV PACING THRESHOLD AMPLITUDE: 0.375 V
MDC IDC MSMT LEADCHNL RV PACING THRESHOLD PULSEWIDTH: 0.4 ms
MDC IDC MSMT LEADCHNL RV SENSING INTR AMPL: 22.5 mV
MDC IDC MSMT LEADCHNL RV SENSING INTR AMPL: 22.5 mV
MDC IDC PG IMPLANT DT: 20130715
MDC IDC SET LEADCHNL RA PACING AMPLITUDE: 5.5 V
MDC IDC SET LEADCHNL RV PACING AMPLITUDE: 2 V
MDC IDC SET LEADCHNL RV PACING PULSEWIDTH: 0.4 ms
MDC IDC SET LEADCHNL RV SENSING SENSITIVITY: 0.3 mV
MDC IDC STAT BRADY AP VP PERCENT: 0.85 %
MDC IDC STAT BRADY AS VP PERCENT: 2.96 %
MDC IDC STAT BRADY AS VS PERCENT: 95.8 %

## 2017-08-14 ENCOUNTER — Other Ambulatory Visit: Payer: Self-pay | Admitting: Cardiology

## 2017-08-14 NOTE — Telephone Encounter (Signed)
Discharge Medications: New Medications   acetaminophen 325 MG tablet  650 mg, Oral, Every 6 hours PRN  Quantity: 30 tablet  Refills: 0  apixaban 5 mg tablet ELIQUIS  5 mg, Oral, Every 12 hours  Refills: 0  aspirin 81 MG chewable tablet  Start taking on: 06/26/2017 Replaces: aspirin 325 MG EC tablet;81 mg, Oral, Daily Quantity: 30 tablet Refills: 0  atropine 1 % ophthalmic solution 1 drop, RIGHT Eye, 2 times Daily Quantity: 2 mL Refills: 0  ipratropium-albuterol nebulizer solution DUO-NEB 3 mLs, Nebulization, 4 times Daily PRN Quantity: 360mL Refills: 0  lidocaine 5 % patch; LIDODERM 1 patch, Transdermal, Every 24 hours, Apply patch to the most painful area for up to 12 hours in a 24 hour period. Quantity: 30 patch Refills: 0  melatonin 3 mg tablet 3 mg, Oral, Nightly Refills: 0  metoprolol succinate 25 MG XL tablet; TOPROL-XL Start taking on: 06/26/2017 75 mg, Oral, Daily Quantity: 90 tablet Refills: 0  polyvinyl alcohol 1.4 % ophthalmic solution Commonly known as: LiquiTears 1 drop, LEFT Eye, 4 times Daily PRN Quantity: 15 mL Refills: 0  prednisoLONE acetate 1 % ophthalmic suspension; PRED FORTE 1 drop, RIGHT Eye, 4 times Daily Quantity: 5 mL Refills: 0  sennosides-docusate 8.6-50 mg tablet Commonly known as: SENOKOT-S 2 tablets, Oral, 2 times Daily Quantity: 120 tablet Refills:0  spironolactone 25 MG tablet: ALDACTONE Start taking on: 06/26/2017 12.5 mg, Oral, Daily Quantity: 15 tablet Refills: 0  Modified Medications  Details  ALPRAZolam 1 MG tablet Commonly known as: XANAX What changed:   medication strength  See the new instructions. 1 mg, Oral, 2 times Daily PRN Quantity: 10 tablet Refills: 0  atorvastatin 80 MG tablet Commonly known as: LIPITOR What changed:   how much to take  when to take this 80 mg, Oral, Nightly Quantity: 30 tablet Refills: 0  citalopram 20 MG tablet Commonly known as: CeleXA What changed:   how much to take  when to take  this 20 mg, Oral, Daily Refills: 0  fexofenadine 180 MG tablet Commonly known as: ALLEGRA What changed:   how much to take  when to take this  reasons to take this 180 mg, Oral, Daily PRN Refills: 0  fluticasone propion-salmeterol 250-50 mcg/dose diskus inhaler Commonly known as: ADVAIR DISKUS What changed: See the new instructions. 1 inhalation, Inhalation, Every 12 hours Quantity: 1 Inhaler Refills: 0  lansoprazole 30 MG DR capsule Commonly known as: PREVACID What changed:   how much to take  when to take this 30 mg, Oral, Daily Refills: 0  lisinopril 2.5 MG tablet Commonly known as: PRINIVIL,ZESTRIL What changed:   medication strength  how much to take  how to take this  when to take this 2.5 mg, Oral, Daily Refills: 0  multivitamin with iron-minerals tablet Commonly known as: VITAMINS AND MINERALS What changed:   how much to take  when to take this 1 tablet, Oral, Daily Refills: 0  oxyCODONE 5 MG immediate release tablet Commonly known as: ROXICODONE What changed:   how much to take  when to take this  reasons to take this 5 mg, Oral, Every 4 hours PRN Quantity: 30 tablet Refills: 0  polyethylene glycol packet Commonly known as: MIRALAX Start taking on: 06/26/2017 What changed:   how much to take  when to take this  additional instructions 17 g, Oral, Daily, Mix in 4-8ounces of fluid prior to taking. Quantity: 14 each Refills: 0  tamsulosin 0.4 mg capsule Commonly known as: FLOMAX Start  taking on: 06/26/2017 What changed:   how much to take  when to take this  additional instructions 0.4 mg, Oral, Daily, Take 30 minutes after same meal each day. Quantity: 30 capsule Refills: 0  tiZANidine 2 MG tablet Commonly known as: ZANAFLEX What changed:   medication strength  how much to take  how to take this  when to take this  reasons to take this 2 mg, Oral, 2 times Daily PRN Refills: 0  TORsemide 20 MG  tablet Commonly known as: DEMADEX Start taking on: 06/26/2017 What changed:   how much to take  when to take this 20 mg, Oral, Daily Quantity: 30 tablet Refills: 0   Medications To Continue  Details  naloxone 1 mg/mL injection Commonly known as: NARCAN For suspected opioid overdose, spray 1 mL in each nostril. Repeat after 3 minutes if no or minimal response. Refills: 0   Stopped Medications  aspirin 325 MG EC tablet Replaced by: aspirin 81 MG chewable tablet  cyclobenzaprine 10 MG tablet Commonly known as: FLEXERIL  docusate 100 MG capsule Commonly known as: COLACE  FISH OIL 360-1,200 mg Cap Generic drug: omega-3 fatty acids-fish oil  FUROsemide 40 MG tablet Commonly known as: LASIX  loratadine 10 mg tablet Commonly known as: CLARITIN  meloxicam 15 MG tablet Commonly known as: MOBIC  methocarbamol 500 MG tablet Commonly known as: ROBAXIN  oxyCODONE-acetaminophen 10-325 mg tablet Commonly known as: PERCOCET  potassium chloride 20 MEQ ER tablet Commonly known as: K-DUR,KLOR-CON  PROVENTIL HFA 90 mcg/actuation inhaler Generic drug: albuterol  QNASL 80 mcg/actuation Hfaa Generic drug: beclomethasone dipropionate  traZODone 50 MG tablet Commonly known as: DESYREL    Beta Blocker: prescribed  ACE Inhibitor/ARB: prescribed  Spironolactone: prescribed   Discharge Labs: Recent Labs  Lab 06/23/17 0724 06/24/17 0523 06/25/17 0600 06/25/17 0832  NA 139 137 135 --  K 4.8 4.5 -- 4.2  CL 101 102 103 --  CO2 28 27 24  --  BUN 11 12 13  --  CREATININE 0.8 0.9 0.8 --  GLUCOSE 99 98 85 --  CALCIUM 9.1 8.7 8.7 --   Recent Labs  Lab 06/23/17 0724 06/24/17 0523 06/25/17 0600  WBC 8.3 8.3 7.0  HGB 12.5* 13.1* 13.0*  HCT 41.7 42.5 43.8  PLT 385 371 289   Recent Labs  Lab 06/21/17 0333  APTT 30.8  INR 1.3*   No results for input(s): LDH in the last 168 hours.   Disposition:  Condition on Discharge: stable Code status: Full  Code Disposition: [facility] Discharge Instructions:  Discharge Procedure Orders  Notify cardiology provider of chest pain   Notify provider of swelling in arms, legs, or stomach   Notify provider temperature greater than 101.0 F (38.3 C) degrees   Notify provider of weight gain greater than 2 lbs in 1 day or 5 lbs in 1 week   Report questions or concerns to the [facility] at [phone]   Notify primary care physician of other symptoms   For a life-threatening emergency, call 911   Weigh yourself daily and record   If you smoke (or have smoked within the last year), we strongly recommend that you do not smoke.   Universal Precautions   Notify provider of bleeding or swelling at groin cath access site   Notify provider of nausea or vomiting   Notify provider of dizziness or passing out   Notify provider of abdominal pain   Notify provider of difficulty breathing or shortness of breath  Physical Therapy Evaluation  Order Comments: Evaluate and treat per facility protocol.   Order Specific Question Answer Comments  Patient's weight bearing status: No weight bearing restrictions   Occupational Therapy Evaluation  Order Comments: Evaluate and treat per facility protocol.   Order Specific Question Answer Comments  Patient's weight bearing status: No weight bearing restrictions   Do not lift anything greater than 10 lbs  Order Comments: For next 5 days following heart cath on 06/21/2017.   May shower, but no soaking tub baths  Order Comments: For the next 5 days following heart cath on 06/21/2017.   No added salt diet   Renal diet   Vital Signs  Order Comments: Daily or per facility protocol   Intake & Output   Daily Weight   Other activity instructions:  Order Comments: No driving.   Other Nursing  Order Comments: Ocular Precautions: Keep HOB elevated at all times. No eye patch needed. Eye protection at all times (ie, sunglasses).   Patient  Instructed to Report: nausea or vomiting, fever greater than 101.0 F (38,3 C) degrees, bleeding, redness, swelling, pain, or drainage from surgical incision(s), difficulty breathing or shortness of breath, dizziness, weight gain greater than 2 lbs in 1 day or 5 lbs in 1 week  Report Issues: [facility]   Future labs needed: BMP in 2 weeks  Follow-up/Transition Care Plan: Patient was scheduled to follow-up with their Cardiologist or [facility] within one week of discharge and their Primary Care Provider within 2-4 weeks from discharge.  Future Appointments  Date Time Provider Department Center  07/03/2017 11:20 AM Curt Bears, MD 80F/2G C[facility]  PCP f/u with Dr. Benedetto Goad 07/19/2017 10:40am  I have discussed Douglas [name] with family, medical team and outpatient providers and have spent 35 minutes on discharge planning and documentation.  Tereso Newcomer, NP  Electronically signed by Francesco Runner, MD at 06/25/2017 10:38 PM EDT    Associated attestation - Francesco Runner, MD - 06/25/2017 10:38 PM EDT  I personally saw the patient and performed a substantive portion of this encounter in conjunction with the listed APP as documented above.

## 2017-08-14 NOTE — Telephone Encounter (Signed)
°*  STAT* If patient is at the pharmacy, call can be transferred to refill team.   1. Which medications need to be refilled? (please list name of each medication and dose if known) new prescriptions for these medicine-some of these were changed while pt were in the hospital-Eliquis 5 mg, Sprionolactone  25 mg, Atorvastatin 80 mg, Metoprolol 100 mg   2. Which pharmacy/location (including street and city if local pharmacy) is medication to be sent to? [facility][phone]  3. Do they need a 30 day or 90 day supply? 90 and refills all except Eliquis 180 and refills

## 2017-08-15 ENCOUNTER — Telehealth: Payer: Self-pay | Admitting: Cardiology

## 2017-08-15 NOTE — Telephone Encounter (Signed)
Pt calling   Pt is confused about his medication he is taking after being discharged from the hospital. Pt need some guidance and also has some pills he don't know what they are. Please call pt.
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
## 2017-08-18 ENCOUNTER — Encounter: Payer: Self-pay | Admitting: Cardiology
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
## 2017-08-19 ENCOUNTER — Encounter: Payer: Self-pay | Admitting: Cardiology

## 2017-08-19 ENCOUNTER — Ambulatory Visit: Payer: Medicare HMO | Admitting: Cardiology

## 2017-08-19 VITALS — BP 106/74 | HR 87 | Ht 69.0 in | Wt 238.0 lb

## 2017-08-19 DIAGNOSIS — I482 Chronic atrial fibrillation, unspecified: Secondary | ICD-10-CM

## 2017-08-19 DIAGNOSIS — I2510 Atherosclerotic heart disease of native coronary artery without angina pectoris: Secondary | ICD-10-CM

## 2017-08-19 DIAGNOSIS — I5022 Chronic systolic (congestive) heart failure: Secondary | ICD-10-CM

## 2017-08-19 MED ORDER — SPIRONOLACTONE 25 MG PO TABS: 25.0000 mg | ORAL_TABLET | Freq: Every day | ORAL | 3 refills | Status: DC

## 2017-08-19 MED ORDER — ATORVASTATIN CALCIUM 80 MG PO TABS: 80.0000 mg | ORAL_TABLET | Freq: Every day | ORAL | 3 refills | Status: DC

## 2017-08-19 MED ORDER — ELIQUIS 5 MG PO TABS: 5.0000 mg | ORAL_TABLET | Freq: Two times a day (BID) | ORAL | 3 refills | Status: DC

## 2017-08-19 MED ORDER — LISINOPRIL 2.5 MG PO TABS: 2.5000 mg | ORAL_TABLET | Freq: Every day | ORAL | 3 refills | Status: DC

## 2017-08-19 MED ORDER — METOPROLOL SUCCINATE ER 100 MG PO TB24: 100.0000 mg | ORAL_TABLET | Freq: Every day | ORAL | 3 refills | Status: DC

## 2017-08-19 MED ORDER — POTASSIUM CHLORIDE CRYS ER 20 MEQ PO TBCR: 20.0000 meq | EXTENDED_RELEASE_TABLET | Freq: Every day | ORAL | 3 refills | Status: DC

## 2017-08-19 MED ORDER — TORSEMIDE 20 MG PO TABS: 20.0000 mg | ORAL_TABLET | Freq: Every day | ORAL | 3 refills | Status: DC

## 2017-08-19 NOTE — Patient Instructions (Signed)
Medication Instructions:  Continue current medications  If you need a refill on your cardiac medications before your next appointment, please call your pharmacy.  Labwork: None Ordered   Testing/Procedures: None Ordered  Follow-Up: Your physician wants you to follow-up in: Keep upcoming appointment.      Thank you for choosing [facility]!!

## 2017-08-23 ENCOUNTER — Ambulatory Visit: Payer: Medicare HMO | Admitting: Internal Medicine

## 2017-08-23 ENCOUNTER — Ambulatory Visit: Payer: Medicare HMO

## 2017-08-23 ENCOUNTER — Encounter: Payer: Self-pay | Admitting: Internal Medicine

## 2017-08-23 VITALS — BP 124/64 | HR 82 | Ht 69.0 in | Wt 240.0 lb

## 2017-08-23 DIAGNOSIS — I472 Ventricular tachycardia, unspecified: Secondary | ICD-10-CM

## 2017-08-23 DIAGNOSIS — I5022 Chronic systolic (congestive) heart failure: Secondary | ICD-10-CM | POA: Diagnosis not present

## 2017-08-23 DIAGNOSIS — Z95810 Presence of automatic (implantable) cardiac defibrillator: Secondary | ICD-10-CM

## 2017-08-23 DIAGNOSIS — I255 Ischemic cardiomyopathy: Secondary | ICD-10-CM

## 2017-08-23 NOTE — Patient Instructions (Addendum)
Medication Instructions:  Your physician recommends that you continue on your current medications as directed. Please refer to the Current Medication list given to you today.  Labwork: None ordered.  Testing/Procedures: None ordered.  Follow-Up: Your physician wants you to follow-up in: 3 months with [place].      Remote monitoring is used to monitor your ICD from home. This monitoring reduces the number of office visits required to check your device to one time per year. It allows us to keep an eye on the functioning of your device to ensure it is working properly. You are scheduled for a device check from home on 10/29/2017. You may send your transmission at any time that day. If you have a wireless device, the transmission will be sent automatically. After your physician reviews your transmission, you will receive a postcard with your next transmission date.  Any Other Special Instructions Will Be Listed Below (If Applicable).  If you need a refill on your cardiac medications before your next appointment, please call your pharmacy.
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
## 2017-08-27 ENCOUNTER — Ambulatory Visit (INDEPENDENT_AMBULATORY_CARE_PROVIDER_SITE_OTHER): Payer: Medicare HMO | Admitting: Pharmacist

## 2017-08-27 VITALS — BP 90/58 | HR 84

## 2017-08-27 DIAGNOSIS — I10 Essential (primary) hypertension: Secondary | ICD-10-CM

## 2017-08-27 DIAGNOSIS — Z79899 Other long term (current) drug therapy: Secondary | ICD-10-CM
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
## 2017-08-27 NOTE — Patient Instructions (Addendum)
Return for a  follow up appointment as needed  Check your blood pressure at home daily (if able) and keep record of the readings. *No change in medication*   Bring all of your meds, your BP cuff and your record of home blood pressures to your next appointment.  Exercise as you're able, try to walk approximately 30 minutes per day.  Keep salt intake to a minimum, especially watch canned and prepared boxed foods.  Eat more fresh fruits and vegetables and fewer canned items.  Avoid eating in fast food restaurants.    HOW TO TAKE YOUR BLOOD PRESSURE: . Rest 5 minutes before taking your blood pressure. .  Don't smoke or drink caffeinated beverages for at least 30 minutes before. . Take your blood pressure before (not after) you eat. . Sit comfortably with your back supported and both feet on the floor (don't cross your legs). . Elevate your arm to heart level on a table or a desk. . Use the proper sized cuff. It should fit smoothly and snugly around your bare upper arm. There should be enough room to slip a fingertip under the cuff. The bottom edge of the cuff should be 1 inch above the crease of the elbow. . Ideally, take 3 measurements at one sitting and record the average.

## 2017-08-28 ENCOUNTER — Encounter: Payer: Self-pay | Admitting: Pharmacist

## 2017-08-28 NOTE — Assessment & Plan Note (Signed)
All medication was reviewed with patient during office visit. 6 drug were removed from his current medication list due to duplication of therapy. Patient aware of changes in list, and copy of new list provided as part of AVS. [name] was encouraged to contact clinic with additional questions and if problems with BP noted.

## 2017-08-28 NOTE — Assessment & Plan Note (Signed)
Blood pressure today at 90/58 is slightly below patient's average but he had pain medication less than 1hr ago. Patient denies dizziness, lightheadedness or any symptoms of hypotension. Will continue current medication without changes and follow up as needed.

## 2017-09-03 NOTE — Addendum Note (Signed)
Addended by: Lorain ChildesPRESSLEY, Rodrigo Mcgranahan A on: 09/03/2017 03:36 PM   Modules accepted: Orders

## 2017-10-03 DIAGNOSIS — M19039 Primary osteoarthritis, unspecified wrist: Secondary | ICD-10-CM | POA: Insufficient documentation

## 2017-10-03 DIAGNOSIS — G5602 Carpal tunnel syndrome, left upper limb: Secondary | ICD-10-CM | POA: Insufficient documentation

## 2017-10-09 ENCOUNTER — Telehealth: Payer: Self-pay

## 2017-10-09 NOTE — Telephone Encounter (Signed)
   [facility] Pre-operative Risk Assessment    Request for surgical clearance:  1. What type of surgery is being performed? Left CTR   2. When is this surgery scheduled? TBD   3. What type of clearance is required (medical clearance vs. Pharmacy clearance to hold med vs. Both)? Both  4. Are there any medications that need to be held prior to surgery and how long? Eliquis, Aspirin   5. Practice name and name of physician performing surgery? [facility] - Dr.Gramig   6. What is your office phone number [phone]    7.   What is your office fax number [phone]  8.   Anesthesia type (None, local, MAC, general) ? IV sedation   Ena Dawley 10/09/2017, 1:13 PM  _________________________________________________________________   (provider comments below)

## 2017-10-10 NOTE — Telephone Encounter (Signed)
Lmtcb for PraxairSherri Wills surgery scheduler for Dr. Amanda PeaGramig.  We need clarification on the patient's surgery, need to confirm if the "left CTR" is in fact a left carpal tunnel release.

## 2017-10-10 NOTE — Telephone Encounter (Signed)
Please clarify surgery.

## 2017-10-10 NOTE — Telephone Encounter (Signed)
Pt takes Eliquis for afib with CHADS2VASc score of 3 (HTN, CHF, CAD). Renal function is normal. Assume this abbreviation is for carpal tunnel release but would clarify with requesting office. If so, ok to hold Eliquis for 1-2 days prior to procedure. ASA clearance to be addressed by preop pool.

## 2017-10-14 NOTE — Telephone Encounter (Signed)
Left a detailed message for Cordelia PenSherry, surgery scheduler, a detailed message that we needed clarification of type of surgery pt will be having so we can work on his cardiac clearance.

## 2017-10-15 NOTE — Telephone Encounter (Signed)
Eliquis has been addressed for the requested surgery.  Need recommendation on holding aspirin. Sending to Dr. Antoine PocheHochrein to advise.  Dr. Antoine PocheHochrein, please advise how long to hold aspirin if ok.   Please route your response back to CV DIV PREOP  Thanks  Rosalio MacadamiaLori C. Aasir Daigler, RN, [facility]NP-C [facility] [address] [place], [place]  [address] [phone]

## 2017-10-15 NOTE — Telephone Encounter (Signed)
OK to hold ASA if necessary but I would think it was not necessary for this type of surgery.  I will defer to the surgeon and typically would hold 3 to 5 days if necessary.

## 2017-10-15 NOTE — Telephone Encounter (Signed)
Rosalva FerronSherry Wills from Dr.Gramig's office stated Left CTR is left carpal tunnel release.  Will send back to pre-op pool to address.

## 2017-10-15 NOTE — Telephone Encounter (Signed)
Pleasecall the requesting office again to clarify the exact surgery.

## 2017-10-15 NOTE — Telephone Encounter (Signed)
lvm for Rosalva FerronSherry Wills, Dr.Gramig's surgical scheduler about upcoming surgery what does "LEFT CTR " mean.

## 2017-10-16 NOTE — Telephone Encounter (Signed)
Routing to Dr. Trenton FoundsGramig  Nayla Dias C. Saharah Sherrow, RN, [facility] [address] [place], [place]  [id number] [phone]

## 2017-10-29 ENCOUNTER — Encounter: Payer: Medicare HMO | Admitting: *Deleted

## 2017-10-29 ENCOUNTER — Telehealth: Payer: Self-pay | Admitting: Cardiology

## 2017-10-29 NOTE — Telephone Encounter (Signed)
Spoke with pt and reminded pt of remote transmission that is due today. Pt verbalized understanding.   

## 2017-11-01 ENCOUNTER — Encounter: Payer: Self-pay | Admitting: Cardiology

## 2017-11-07 ENCOUNTER — Ambulatory Visit (INDEPENDENT_AMBULATORY_CARE_PROVIDER_SITE_OTHER): Payer: Medicare HMO | Admitting: *Deleted

## 2017-11-07 DIAGNOSIS — I255 Ischemic cardiomyopathy: Secondary | ICD-10-CM

## 2017-11-07 DIAGNOSIS — I5022 Chronic systolic (congestive) heart failure: Secondary | ICD-10-CM

## 2017-11-07 NOTE — Progress Notes (Signed)
Remote ICD transmission.   

## 2017-11-08 LAB — CUP PACEART INCLINIC DEVICE CHECK
Battery Voltage: 2.74 V
Brady Statistic AP VP Percent: 0.66 %
Brady Statistic AS VP Percent: 4.62 %
Brady Statistic RA Percent Paced: 0.4 %
Brady Statistic RV Percent Paced: 3.9 %
HIGH POWER IMPEDANCE MEASURED VALUE: 475 Ohm
HIGH POWER IMPEDANCE MEASURED VALUE: 80 Ohm
Implantable Lead Implant Date: 20130715
Implantable Lead Location: 753860
Implantable Lead Model: 5076
Implantable Pulse Generator Implant Date: 20130715
Lead Channel Impedance Value: 551 Ohm
Lead Channel Impedance Value: 931 Ohm
Lead Channel Pacing Threshold Amplitude: 0.5 V
Lead Channel Pacing Threshold Pulse Width: 0.4 ms
Lead Channel Sensing Intrinsic Amplitude: 31.625 mV
Lead Channel Setting Pacing Amplitude: 2 V
Lead Channel Setting Sensing Sensitivity: 0.3 mV
MDC IDC LEAD IMPLANT DT: 20060116
MDC IDC LEAD LOCATION: 753859
MDC IDC MSMT LEADCHNL RA SENSING INTR AMPL: 0.25 mV
MDC IDC SESS DTM: 20190628171704
MDC IDC SET LEADCHNL RA PACING AMPLITUDE: 5.5 V
MDC IDC SET LEADCHNL RV PACING PULSEWIDTH: 0.4 ms
MDC IDC STAT BRADY AP VS PERCENT: 0.32 %
MDC IDC STAT BRADY AS VS PERCENT: 94.4 %

## 2017-11-20 ENCOUNTER — Ambulatory Visit: Payer: Medicare HMO | Admitting: Internal Medicine

## 2017-11-20 ENCOUNTER — Encounter: Payer: Self-pay | Admitting: Internal Medicine

## 2017-11-20 VITALS — BP 136/74 | HR 76 | Ht 69.0 in | Wt 237.0 lb

## 2017-11-20 DIAGNOSIS — I482 Chronic atrial fibrillation, unspecified: Secondary | ICD-10-CM

## 2017-11-20 DIAGNOSIS — I5022 Chronic systolic (congestive) heart failure: Secondary | ICD-10-CM

## 2017-11-20 NOTE — Patient Instructions (Signed)
Medication Instructions:  Your physician recommends that you continue on your current medications as directed. Please refer to the Current Medication list given to you today.   Labwork: NONE   Testing/Procedures: NONE   Follow-Up: Your physician wants you to follow-up in: 1 Year. You will receive a reminder letter in the mail two months in advance. If you don't receive a letter, please call our office to schedule the follow-up appointment.   Any Other Special Instructions Will Be Listed Below (If Applicable).     If you need a refill on your cardiac medications before your next appointment, please call your pharmacy.  Thank you for choosing [facility]!   
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
## 2017-11-27 LAB — CUP PACEART REMOTE DEVICE CHECK
Battery Voltage: 2.64 V
Brady Statistic AP VP Percent: 0.32 %
Brady Statistic AP VS Percent: 0.12 %
Brady Statistic AS VS Percent: 79.76 %
Brady Statistic RV Percent Paced: 19.92 %
Date Time Interrogation Session: 20190912185304
HIGH POWER IMPEDANCE MEASURED VALUE: 475 Ohm
HIGH POWER IMPEDANCE MEASURED VALUE: 80 Ohm
Implantable Lead Implant Date: 20060116
Implantable Lead Implant Date: 20130715
Implantable Lead Location: 753860
Implantable Pulse Generator Implant Date: 20130715
Lead Channel Impedance Value: 551 Ohm
Lead Channel Impedance Value: 931 Ohm
Lead Channel Pacing Threshold Amplitude: 0.375 V
Lead Channel Pacing Threshold Pulse Width: 0.4 ms
Lead Channel Sensing Intrinsic Amplitude: 31.625 mV
Lead Channel Sensing Intrinsic Amplitude: 31.625 mV
Lead Channel Setting Pacing Amplitude: 2 V
Lead Channel Setting Pacing Amplitude: 5.5 V
Lead Channel Setting Pacing Pulse Width: 0.4 ms
Lead Channel Setting Sensing Sensitivity: 0.3 mV
MDC IDC LEAD LOCATION: 753859
MDC IDC MSMT LEADCHNL RA PACING THRESHOLD AMPLITUDE: 2.5 V
MDC IDC MSMT LEADCHNL RA SENSING INTR AMPL: 0.5 mV
MDC IDC MSMT LEADCHNL RA SENSING INTR AMPL: 0.5 mV
MDC IDC MSMT LEADCHNL RV PACING THRESHOLD PULSEWIDTH: 0.4 ms
MDC IDC STAT BRADY AS VP PERCENT: 19.8 %
MDC IDC STAT BRADY RA PERCENT PACED: 0.24 %

## 2017-12-16 ENCOUNTER — Encounter (HOSPITAL_COMMUNITY): Payer: Self-pay | Admitting: Physician Assistant

## 2017-12-16 NOTE — Progress Notes (Signed)
I faxed pre- op device orders to [facility].
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
## 2017-12-17 ENCOUNTER — Ambulatory Visit (HOSPITAL_COMMUNITY): Admission: RE | Admit: 2017-12-17 | Payer: Medicare HMO | Source: Ambulatory Visit | Admitting: Orthopedic Surgery

## 2017-12-17 SURGERY — CARPAL TUNNEL RELEASE
Anesthesia: Monitor Anesthesia Care | Laterality: Left

## 2018-01-10 ENCOUNTER — Telehealth: Payer: Self-pay | Admitting: Cardiology

## 2018-01-10 NOTE — Telephone Encounter (Signed)
° °  Patient calling to request a letter that states he can drive to provide DMV. He is requesting a letter stating his BP medications have been changed.  Patient has contacted Dr Tawana ScaleWilson's office, but states they have not addressed the issue of BP and driving
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
## 2018-01-13 NOTE — Telephone Encounter (Signed)
Follow up    Patient is calling back in reference to not hearing from Dr. Andrey CampanileWilson. He is pleading for contact to be made again with Dr. Andrey CampanileWilson office because he desperately needs those forms to be filled out in order to drive. If not can Dr. Antoine PocheHochrein do the letter. Please call to discuss.

## 2018-01-14 NOTE — Telephone Encounter (Signed)
Unfortunately I could not comment on his ability to drive if the question is his vision and the limitations there.  We can certainly comment that 6 months after his syncopal episode without any recurrent events he be okay to drive from a cardiac standpoint.

## 2018-01-15 NOTE — Telephone Encounter (Signed)
Call and spoke with pt to see how everything goes with Dr Andrey CampanileWilson, pt stated that everything is taken care of paper work was faxed this morning from Dr Andrey CampanileWilson office, pt voice thanks.

## 2018-02-06 ENCOUNTER — Ambulatory Visit (INDEPENDENT_AMBULATORY_CARE_PROVIDER_SITE_OTHER): Payer: Medicare HMO

## 2018-02-06 DIAGNOSIS — I255 Ischemic cardiomyopathy: Secondary | ICD-10-CM | POA: Diagnosis not present

## 2018-02-06 DIAGNOSIS — I5022 Chronic systolic (congestive) heart failure: Secondary | ICD-10-CM

## 2018-02-07 ENCOUNTER — Encounter: Payer: Self-pay | Admitting: Cardiology

## 2018-02-07 NOTE — Progress Notes (Signed)
Remote ICD transmission.   

## 2018-03-22 LAB — CUP PACEART REMOTE DEVICE CHECK
Battery Voltage: 2.64 V
Brady Statistic AP VP Percent: 0.39 %
Brady Statistic AP VS Percent: 0.21 %
Brady Statistic AS VS Percent: 94.17 %
Brady Statistic RA Percent Paced: 0.5 %
HighPow Impedance: 475 Ohm
HighPow Impedance: 64 Ohm
Implantable Lead Implant Date: 20060116
Implantable Lead Implant Date: 20130715
Implantable Lead Location: 753859
Implantable Lead Location: 753860
Implantable Lead Model: 5076
Implantable Lead Model: 6935
Implantable Pulse Generator Implant Date: 20130715
Lead Channel Impedance Value: 532 Ohm
Lead Channel Impedance Value: 931 Ohm
Lead Channel Pacing Threshold Amplitude: 0.375 V
Lead Channel Pacing Threshold Amplitude: 2.5 V
Lead Channel Pacing Threshold Pulse Width: 0.4 ms
Lead Channel Pacing Threshold Pulse Width: 0.4 ms
Lead Channel Sensing Intrinsic Amplitude: 26.75 mV
Lead Channel Sensing Intrinsic Amplitude: 4.25 mV
Lead Channel Sensing Intrinsic Amplitude: 4.25 mV
Lead Channel Setting Pacing Amplitude: 2 V
Lead Channel Setting Pacing Pulse Width: 0.4 ms
Lead Channel Setting Sensing Sensitivity: 0.3 mV
MDC IDC MSMT LEADCHNL RV SENSING INTR AMPL: 26.75 mV
MDC IDC SESS DTM: 20191212094226
MDC IDC STAT BRADY AS VP PERCENT: 5.22 %
MDC IDC STAT BRADY RV PERCENT PACED: 9.21 %

## 2018-05-08 ENCOUNTER — Ambulatory Visit (INDEPENDENT_AMBULATORY_CARE_PROVIDER_SITE_OTHER): Payer: Medicare Other | Admitting: *Deleted

## 2018-05-08 DIAGNOSIS — I255 Ischemic cardiomyopathy: Secondary | ICD-10-CM

## 2018-05-08 LAB — CUP PACEART REMOTE DEVICE CHECK
Battery Voltage: 2.63 V
Brady Statistic AP VP Percent: 0 %
Brady Statistic AP VS Percent: 0 %
Brady Statistic AS VP Percent: 4.57 %
Brady Statistic AS VS Percent: 95.43 %
Brady Statistic RV Percent Paced: 11.12 %
Date Time Interrogation Session: 20200312041606
HIGH POWER IMPEDANCE MEASURED VALUE: 475 Ohm
HIGH POWER IMPEDANCE MEASURED VALUE: 68 Ohm
Implantable Lead Implant Date: 20060116
Implantable Lead Implant Date: 20130715
Implantable Lead Location: 753859
Implantable Lead Location: 753860
Implantable Lead Model: 5076
Implantable Lead Model: 6935
Implantable Pulse Generator Implant Date: 20130715
Lead Channel Impedance Value: 551 Ohm
Lead Channel Pacing Threshold Amplitude: 0.375 V
Lead Channel Pacing Threshold Amplitude: 2.5 V
Lead Channel Pacing Threshold Pulse Width: 0.4 ms
Lead Channel Sensing Intrinsic Amplitude: 31 mV
Lead Channel Sensing Intrinsic Amplitude: 31 mV
Lead Channel Sensing Intrinsic Amplitude: 4.25 mV
Lead Channel Sensing Intrinsic Amplitude: 4.25 mV
Lead Channel Setting Pacing Amplitude: 2 V
Lead Channel Setting Pacing Pulse Width: 0.4 ms
Lead Channel Setting Sensing Sensitivity: 0.3 mV
MDC IDC MSMT LEADCHNL RA IMPEDANCE VALUE: 931 Ohm
MDC IDC MSMT LEADCHNL RA PACING THRESHOLD PULSEWIDTH: 0.4 ms
MDC IDC STAT BRADY RA PERCENT PACED: 0 %

## 2018-05-09 ENCOUNTER — Telehealth: Payer: Self-pay | Admitting: *Deleted

## 2018-05-09 NOTE — Telephone Encounter (Signed)
LMOM requesting call back to the DC. Gave direct number for return call.   Per 05/08/18 remote transmission, patient's ICD is nearing RRT (battery voltage 2.63V, RRT 2.63V). Pt's monitor appears to be transmitting automatically, but will advise of alert tone.

## 2018-05-12 ENCOUNTER — Telehealth: Payer: Self-pay | Admitting: Cardiology

## 2018-05-12 NOTE — Telephone Encounter (Signed)
Spoke w/ pt and informed him that his device has not reached RRT on the May 08, 2018 remote. I informed him that once his device does reach RRT we will call him and schedule him an appt w/ GT and that more than likely it will be before September. Pt verbalized understanding.

## 2018-05-15 ENCOUNTER — Encounter: Payer: Self-pay | Admitting: Cardiology

## 2018-05-15 NOTE — Progress Notes (Signed)
Remote ICD transmission.   

## 2018-05-19 ENCOUNTER — Other Ambulatory Visit: Payer: Self-pay | Admitting: Cardiology

## 2018-06-09 ENCOUNTER — Other Ambulatory Visit: Payer: Self-pay | Admitting: Cardiology

## 2018-07-18 ENCOUNTER — Other Ambulatory Visit: Payer: Self-pay | Admitting: Cardiology

## 2018-07-23 ENCOUNTER — Telehealth: Payer: Self-pay | Admitting: Cardiology

## 2018-07-23 NOTE — Telephone Encounter (Signed)
° ° °  appt scheduled , patient last seen 08/19/2017

## 2018-07-23 NOTE — Telephone Encounter (Signed)
LVM for pt to call back and schedule a virtual visit with Dr. Antoine Poche.  Not seen since 2017.

## 2018-08-07 ENCOUNTER — Telehealth: Payer: Self-pay | Admitting: Student

## 2018-08-07 ENCOUNTER — Ambulatory Visit (INDEPENDENT_AMBULATORY_CARE_PROVIDER_SITE_OTHER): Payer: Medicare Other | Admitting: *Deleted

## 2018-08-07 DIAGNOSIS — I255 Ischemic cardiomyopathy: Secondary | ICD-10-CM

## 2018-08-07 LAB — CUP PACEART REMOTE DEVICE CHECK
Battery Voltage: 2.61 V
Brady Statistic AP VP Percent: 0 %
Brady Statistic AP VS Percent: 0 %
Brady Statistic AS VP Percent: 5.24 %
Brady Statistic AS VS Percent: 94.76 %
Brady Statistic RA Percent Paced: 0 %
Brady Statistic RV Percent Paced: 10.18 %
Date Time Interrogation Session: 20200611041606
HighPow Impedance: 80 Ohm
Implantable Lead Implant Date: 20060116
Implantable Lead Implant Date: 20130715
Implantable Lead Location: 753859
Implantable Lead Location: 753860
Implantable Lead Model: 5076
Implantable Lead Model: 6935
Implantable Pulse Generator Implant Date: 20130715
Lead Channel Impedance Value: 589 Ohm
Lead Channel Impedance Value: 931 Ohm
Lead Channel Pacing Threshold Amplitude: 0.5 V
Lead Channel Pacing Threshold Amplitude: 2.5 V
Lead Channel Pacing Threshold Pulse Width: 0.4 ms
Lead Channel Pacing Threshold Pulse Width: 0.4 ms
Lead Channel Sensing Intrinsic Amplitude: 31.625 mV
Lead Channel Sensing Intrinsic Amplitude: 4.25 mV
Lead Channel Setting Pacing Amplitude: 2 V
Lead Channel Setting Pacing Pulse Width: 0.4 ms
Lead Channel Setting Sensing Sensitivity: 0.3 mV

## 2018-08-07 NOTE — Telephone Encounter (Signed)
Pt at [facility] as of 08/07/2018. Will need appt to discuss gen change.  Thank you!   Legrand Como [address]" [place], PA-C 08/07/2018 4:44 PM

## 2018-08-13 ENCOUNTER — Encounter: Payer: Self-pay | Admitting: Cardiology

## 2018-08-13 NOTE — Progress Notes (Signed)
Remote ICD transmission.   

## 2018-08-25 NOTE — Telephone Encounter (Signed)
Phone visit scheduled for 7/31

## 2018-08-28 ENCOUNTER — Other Ambulatory Visit: Payer: Self-pay | Admitting: Cardiology

## 2018-08-28 DIAGNOSIS — I4891 Unspecified atrial fibrillation: Secondary | ICD-10-CM

## 2018-08-28 NOTE — Telephone Encounter (Signed)
Outdated labs but age and weight are good will give enough enough to get pt to upcoming appt with hochrein. 62yom requesting eliquis 5mg  wt 116kg

## 2018-08-28 NOTE — Telephone Encounter (Signed)
Called pt and lmomed overdue for labs awaiting callback

## 2018-09-25 ENCOUNTER — Other Ambulatory Visit: Payer: Self-pay | Admitting: Cardiology

## 2018-09-25 ENCOUNTER — Telehealth: Payer: Self-pay | Admitting: Internal Medicine

## 2018-09-25 NOTE — Telephone Encounter (Signed)

## 2018-09-26 ENCOUNTER — Other Ambulatory Visit (HOSPITAL_COMMUNITY)
Admission: RE | Admit: 2018-09-26 | Discharge: 2018-09-26 | Disposition: A | Discharge status: Home / Self Care | Payer: Medicare Other | Source: Ambulatory Visit | Attending: Internal Medicine | Admitting: Internal Medicine

## 2018-09-26 ENCOUNTER — Other Ambulatory Visit: Payer: Self-pay

## 2018-09-26 ENCOUNTER — Telehealth (INDEPENDENT_AMBULATORY_CARE_PROVIDER_SITE_OTHER): Payer: Medicare Other | Admitting: Internal Medicine

## 2018-09-26 ENCOUNTER — Other Ambulatory Visit: Payer: Medicare Other | Admitting: *Deleted

## 2018-09-26 DIAGNOSIS — Z20828 Contact with and (suspected) exposure to other viral communicable diseases: Secondary | ICD-10-CM | POA: Diagnosis present

## 2018-09-26 DIAGNOSIS — Z95810 Presence of automatic (implantable) cardiac defibrillator: Secondary | ICD-10-CM

## 2018-09-26 DIAGNOSIS — I5022 Chronic systolic (congestive) heart failure: Secondary | ICD-10-CM

## 2018-09-26 DIAGNOSIS — I255 Ischemic cardiomyopathy: Secondary | ICD-10-CM

## 2018-09-26 LAB — CBC WITH DIFFERENTIAL/PLATELET
Basophils Absolute: 0.1 10*3/uL (ref 0.0–0.2)
Basos: 1 %
EOS (ABSOLUTE): 0.2 10*3/uL (ref 0.0–0.4)
Eos: 2 %
Hematocrit: 48 % (ref 37.5–51.0)
Hemoglobin: 15.9 g/dL (ref 13.0–17.7)
Immature Grans (Abs): 0 10*3/uL (ref 0.0–0.1)
Immature Granulocytes: 0 %
Lymphocytes Absolute: 3.1 10*3/uL (ref 0.7–3.1)
Lymphs: 30 %
MCH: 32.4 pg (ref 26.6–33.0)
MCHC: 33.1 g/dL (ref 31.5–35.7)
MCV: 98 fL — ABNORMAL HIGH (ref 79–97)
Monocytes Absolute: 1.1 10*3/uL — ABNORMAL HIGH (ref 0.1–0.9)
Monocytes: 10 %
Neutrophils Absolute: 5.8 10*3/uL (ref 1.4–7.0)
Neutrophils: 57 %
Platelets: 272 10*3/uL (ref 150–450)
RBC: 4.9 x10E6/uL (ref 4.14–5.80)
RDW: 13.2 % (ref 11.6–15.4)
WBC: 10.3 10*3/uL (ref 3.4–10.8)

## 2018-09-26 LAB — BASIC METABOLIC PANEL
BUN/Creatinine Ratio: 16 (ref 10–24)
BUN: 21 mg/dL (ref 8–27)
CO2: 24 mmol/L (ref 20–29)
Calcium: 10.1 mg/dL (ref 8.6–10.2)
Chloride: 98 mmol/L (ref 96–106)
Creatinine, Ser: 1.31 mg/dL — ABNORMAL HIGH (ref 0.76–1.27)
GFR calc Af Amer: 67 mL/min/{1.73_m2} (ref >59)
GFR calc non Af Amer: 58 mL/min/{1.73_m2} — ABNORMAL LOW (ref >59)
Glucose: 92 mg/dL (ref 65–99)
Potassium: 5.7 mmol/L — ABNORMAL HIGH (ref 3.5–5.2)
Sodium: 141 mmol/L (ref 134–144)

## 2018-09-26 LAB — SARS CORONAVIRUS 2 (TAT 6-24 HRS): SARS Coronavirus 2: NEGATIVE

## 2018-09-26 NOTE — Addendum Note (Signed)
Addended by: Willeen Cass A on: 09/26/2018 09:40 AM   Modules accepted: Orders

## 2018-09-26 NOTE — Progress Notes (Signed)
Electrophysiology TeleHealth Note   Due to national recommendations of social distancing due to COVID 19, an audio/video telehealth visit is felt to be most appropriate for this patient at this time.  See MyChart message from today for the patient's consent to telehealth for [facility].   Date:  09/26/2018   ID:  Douglas [name], DOB August 21, 1955, MRN [id number]  Location: patient's home  Provider location: [address] [place]  Evaluation Performed: Follow-up visit  PCP:  Christain Sacramento, MD  Cardiologist:  Minus Breeding, MD  Electrophysiologist:  Dr Lovena Le Chief Complaint:  "I'm doing alright."  History of Present Illness:    Douglas [name] is a 63 y.o. male who presents via audio/video conferencing for a telehealth visit today.  Since last being seen in our clinic, the patient reports doing very well.  Today, he denies symptoms of palpitations, chest pain, shortness of breath,  lower extremity edema, dizziness, presyncope, or syncope.  The patient is otherwise without complaint today.  The patient denies symptoms of fevers, chills, cough, or new SOB worrisome for COVID 19.  Past Medical History:  Diagnosis Date  . A-fib ([place])   . AICD (automatic cardioverter/defibrillator) present   . Anemia   . Asthma   . CAD (coronary artery disease)   . CHF (congestive heart failure) ([place])   . Dyslipidemia   . Elevated cholesterol   . Hypertension     Past Surgical History:  Procedure Laterality Date  . [place]   stent placement  . CARDIAC DEFIBRILLATOR PLACEMENT    . CORONARY ARTERY BYPASS GRAFT  2002   times 4  . CORONARY ARTERY BYPASS GRAFT    . EYE SURGERY    . IMPLANTABLE CARDIOVERTER DEFIBRILLATOR (ICD) GENERATOR CHANGE N/A 09/10/2011   Procedure: ICD GENERATOR CHANGE;  Surgeon: Evans Lance, MD;  Location: [facility] CATH LAB;  Service: Cardiovascular;  Laterality: N/A;  . INSERT / REPLACE / REMOVE PACEMAKER    . LEFT HEART  CATHETERIZATION WITH CORONARY ANGIOGRAM N/A 08/29/2011   Procedure: LEFT HEART CATHETERIZATION WITH CORONARY ANGIOGRAM;  Surgeon: Peter M Martinique, MD;  Location: [facility] CATH LAB;  Service: Cardiovascular;  Laterality: N/A;    Current Outpatient Medications  Medication Sig Dispense Refill  . albuterol (PROVENTIL HFA;VENTOLIN HFA) 108 (90 Base) MCG/ACT inhaler Inhale 2 puffs into the lungs every 6 (six) hours as needed for wheezing or shortness of breath.     . alprazolam (XANAX) 2 MG tablet Take 1 tablet by mouth daily.    Marland Kitchen atorvastatin (LIPITOR) 80 MG tablet Take 1 tablet (80 mg total) by mouth daily. 90 tablet 3  . atropine 1 % ophthalmic solution PLACE 1 DROP IN THE RIGHT EYE TWICE DAILY    . Beclomethasone Dipropionate 80 MCG/ACT AERS Place 1-2 sprays into the nose daily.    . bethanechol (URECHOLINE) 10 MG tablet Take 1 tablet (10 mg total) by mouth 3 (three) times daily. 30 tablet 0  . citalopram (CELEXA) 20 MG tablet Take 20 mg by mouth daily.    Marland Kitchen docusate sodium (COLACE) 100 MG capsule Take 1 capsule (100 mg total) by mouth 2 (two) times daily. 10 capsule 0  . ELIQUIS 5 MG TABS tablet TAKE ONE TABLET BY MOUTH TWICE DAILY 120 tablet 0  . Ferrous Sulfate (IRON) 142 (45 Fe) MG TBCR Take 1 tablet by mouth daily.    . Fluticasone-Salmeterol (ADVAIR) 250-50 MCG/DOSE AEPB Inhale 1 puff into the lungs 2 (  two) times daily.    . hydrocortisone (ANUSOL-HC) 2.5 % rectal cream Place 1 application rectally 2 (two) times daily. When hemorrhoids are active 30 g 0  . lansoprazole (PREVACID) 30 MG capsule Take 30 mg by mouth daily.      Marland Kitchen. lisinopril (PRINIVIL,ZESTRIL) 2.5 MG tablet TAKE ONE (1) TABLET EACH DAY 90 tablet 3  . loratadine (CLARITIN) 10 MG tablet Take 10 mg by mouth at bedtime.    . Melatonin 3 MG TABS Take 1 tablet by mouth at bedtime.    . meloxicam (MOBIC) 15 MG tablet Take 15 mg by mouth daily.    . metoprolol succinate (TOPROL-XL) 100 MG 24 hr tablet TAKE ONE (1) TABLET EACH DAY 90 tablet 0   . Multiple Vitamin (MULTIVITAMIN WITH MINERALS) TABS Take 1 tablet by mouth daily.    . Omega-3 Fatty Acids (FISH OIL) 1200 MG CAPS Take 1,200 mg by mouth daily.    Marland Kitchen. oxyCODONE-acetaminophen (PERCOCET) 10-325 MG per tablet Take 1 tablet by mouth every 6 (six) hours as needed for pain.     . polyethylene glycol (MIRALAX / GLYCOLAX) packet Take 17 g by mouth daily as needed for mild constipation. 14 each 0  . potassium chloride SA (K-DUR,KLOR-CON) 20 MEQ tablet Take 1 tablet (20 mEq total) by mouth daily. 90 tablet 3  . prednisoLONE acetate (PRED FORTE) 1 % ophthalmic suspension Place 1 drop into the right eye 4 (four) times daily.    . promethazine (PHENERGAN) 25 MG tablet TAKE 1/2 TO 1 TABLET EVERY 4 HOURS AS NEEDED FOR NAUSEA    . spironolactone (ALDACTONE) 25 MG tablet TAKE ONE (1) TABLET EACH DAY 90 tablet 3  . tamsulosin (FLOMAX) 0.4 MG CAPS capsule Take 1 capsule (0.4 mg total) by mouth daily. 30 capsule 6  . tiZANidine (ZANAFLEX) 4 MG tablet Take 4 mg by mouth 2 (two) times daily.     Marland Kitchen. torsemide (DEMADEX) 20 MG tablet TAKE ONE (1) TABLET EACH DAY 30 tablet 1  . traZODone (DESYREL) 50 MG tablet Take 50 mg by mouth at bedtime. As needed     No current facility-administered medications for this visit.     Allergies:   Codeine, Codeine, Xarelto [rivaroxaban], Vioxx [rofecoxib], and Vioxx [rofecoxib]   Social History:  The patient  reports that he has never smoked. He has never used smokeless tobacco. He reports that he does not drink alcohol or use drugs.   Family History:  The patient's family history includes Heart attack in his father.   ROS:  Please see the history of present illness.   All other systems are personally reviewed and negative.    Exam:    Vital Signs:  None   Labs/Other Tests and Data Reviewed:    Recent Labs: No results found for requested labs within last 8760 hours.   Wt Readings from Last 3 Encounters:  11/20/17 237 lb (107.5 kg)  08/23/17 240 lb (108.9  kg)  08/19/17 238 lb (108 kg)     Other studies personally reviewed:  Last device remote is reviewed from [employer] PDF dated 08/07/18 which reveals normal device function, [facility]   ASSESSMENT & PLAN:    1.  Chronic systolic heart failure - his symptoms are class 2. He will continue his current meds.  2. ICD - his device was placed for primary prevention and has reached ERI.  3. Obesity - he has lost 65 lbs.  4. COVID 19 screen The patient  denies symptoms of COVID 19 at this time.  The importance of social distancing was discussed today.  Follow-up:  3-4 months after ICD gen change out Next remote: 10/20  Current medicines are reviewed at length with the patient today.   The patient does not have concerns regarding his medicines.  The following changes were made today:  none  Labs/ tests ordered today include: none No orders of the defined types were placed in this encounter.    Patient Risk:  after full review of this patients clinical status, I feel that they are at moderate risk at this time.  Today, I have spent 15 minutes with the patient with telehealth technology discussing as above .    Signed, Lewayne BuntingGregg , MD  09/26/2018 9:12 AM     [facility] [address] [place] [address] [phone] (office) [phone] (fax)

## 2018-09-26 NOTE — H&P (View-Only) (Signed)
Electrophysiology TeleHealth Note   Due to national recommendations of social distancing due to COVID 19, an audio/video telehealth visit is felt to be most appropriate for this patient at this time.  See MyChart message from today for the patient's consent to telehealth for [facility].   Date:  09/26/2018   ID:  Douglas [name], DOB August 21, 1955, MRN [id number]  Location: patient's home  Provider location: [address] [place]  Evaluation Performed: Follow-up visit  PCP:  Christain Sacramento, MD  Cardiologist:  Minus Breeding, MD  Electrophysiologist:  Dr Lovena Le Chief Complaint:  "I'm doing alright."  History of Present Illness:    Douglas [name] is a 63 y.o. male who presents via audio/video conferencing for a telehealth visit today.  Since last being seen in our clinic, the patient reports doing very well.  Today, he denies symptoms of palpitations, chest pain, shortness of breath,  lower extremity edema, dizziness, presyncope, or syncope.  The patient is otherwise without complaint today.  The patient denies symptoms of fevers, chills, cough, or new SOB worrisome for COVID 19.  Past Medical History:  Diagnosis Date  . A-fib ([place])   . AICD (automatic cardioverter/defibrillator) present   . Anemia   . Asthma   . CAD (coronary artery disease)   . CHF (congestive heart failure) ([place])   . Dyslipidemia   . Elevated cholesterol   . Hypertension     Past Surgical History:  Procedure Laterality Date  . [place]   stent placement  . CARDIAC DEFIBRILLATOR PLACEMENT    . CORONARY ARTERY BYPASS GRAFT  2002   times 4  . CORONARY ARTERY BYPASS GRAFT    . EYE SURGERY    . IMPLANTABLE CARDIOVERTER DEFIBRILLATOR (ICD) GENERATOR CHANGE N/A 09/10/2011   Procedure: ICD GENERATOR CHANGE;  Surgeon: Evans Lance, MD;  Location: [facility] CATH LAB;  Service: Cardiovascular;  Laterality: N/A;  . INSERT / REPLACE / REMOVE PACEMAKER    . LEFT HEART  CATHETERIZATION WITH CORONARY ANGIOGRAM N/A 08/29/2011   Procedure: LEFT HEART CATHETERIZATION WITH CORONARY ANGIOGRAM;  Surgeon: Peter M Martinique, MD;  Location: [facility] CATH LAB;  Service: Cardiovascular;  Laterality: N/A;    Current Outpatient Medications  Medication Sig Dispense Refill  . albuterol (PROVENTIL HFA;VENTOLIN HFA) 108 (90 Base) MCG/ACT inhaler Inhale 2 puffs into the lungs every 6 (six) hours as needed for wheezing or shortness of breath.     . alprazolam (XANAX) 2 MG tablet Take 1 tablet by mouth daily.    Marland Kitchen atorvastatin (LIPITOR) 80 MG tablet Take 1 tablet (80 mg total) by mouth daily. 90 tablet 3  . atropine 1 % ophthalmic solution PLACE 1 DROP IN THE RIGHT EYE TWICE DAILY    . Beclomethasone Dipropionate 80 MCG/ACT AERS Place 1-2 sprays into the nose daily.    . bethanechol (URECHOLINE) 10 MG tablet Take 1 tablet (10 mg total) by mouth 3 (three) times daily. 30 tablet 0  . citalopram (CELEXA) 20 MG tablet Take 20 mg by mouth daily.    Marland Kitchen docusate sodium (COLACE) 100 MG capsule Take 1 capsule (100 mg total) by mouth 2 (two) times daily. 10 capsule 0  . ELIQUIS 5 MG TABS tablet TAKE ONE TABLET BY MOUTH TWICE DAILY 120 tablet 0  . Ferrous Sulfate (IRON) 142 (45 Fe) MG TBCR Take 1 tablet by mouth daily.    . Fluticasone-Salmeterol (ADVAIR) 250-50 MCG/DOSE AEPB Inhale 1 puff into the lungs 2 (  two) times daily.    . hydrocortisone (ANUSOL-HC) 2.5 % rectal cream Place 1 application rectally 2 (two) times daily. When hemorrhoids are active 30 g 0  . lansoprazole (PREVACID) 30 MG capsule Take 30 mg by mouth daily.      Marland Kitchen. lisinopril (PRINIVIL,ZESTRIL) 2.5 MG tablet TAKE ONE (1) TABLET EACH DAY 90 tablet 3  . loratadine (CLARITIN) 10 MG tablet Take 10 mg by mouth at bedtime.    . Melatonin 3 MG TABS Take 1 tablet by mouth at bedtime.    . meloxicam (MOBIC) 15 MG tablet Take 15 mg by mouth daily.    . metoprolol succinate (TOPROL-XL) 100 MG 24 hr tablet TAKE ONE (1) TABLET EACH DAY 90 tablet 0   . Multiple Vitamin (MULTIVITAMIN WITH MINERALS) TABS Take 1 tablet by mouth daily.    . Omega-3 Fatty Acids (FISH OIL) 1200 MG CAPS Take 1,200 mg by mouth daily.    Marland Kitchen. oxyCODONE-acetaminophen (PERCOCET) 10-325 MG per tablet Take 1 tablet by mouth every 6 (six) hours as needed for pain.     . polyethylene glycol (MIRALAX / GLYCOLAX) packet Take 17 g by mouth daily as needed for mild constipation. 14 each 0  . potassium chloride SA (K-DUR,KLOR-CON) 20 MEQ tablet Take 1 tablet (20 mEq total) by mouth daily. 90 tablet 3  . prednisoLONE acetate (PRED FORTE) 1 % ophthalmic suspension Place 1 drop into the right eye 4 (four) times daily.    . promethazine (PHENERGAN) 25 MG tablet TAKE 1/2 TO 1 TABLET EVERY 4 HOURS AS NEEDED FOR NAUSEA    . spironolactone (ALDACTONE) 25 MG tablet TAKE ONE (1) TABLET EACH DAY 90 tablet 3  . tamsulosin (FLOMAX) 0.4 MG CAPS capsule Take 1 capsule (0.4 mg total) by mouth daily. 30 capsule 6  . tiZANidine (ZANAFLEX) 4 MG tablet Take 4 mg by mouth 2 (two) times daily.     Marland Kitchen. torsemide (DEMADEX) 20 MG tablet TAKE ONE (1) TABLET EACH DAY 30 tablet 1  . traZODone (DESYREL) 50 MG tablet Take 50 mg by mouth at bedtime. As needed     No current facility-administered medications for this visit.     Allergies:   Codeine, Codeine, Xarelto [rivaroxaban], Vioxx [rofecoxib], and Vioxx [rofecoxib]   Social History:  The patient  reports that he has never smoked. He has never used smokeless tobacco. He reports that he does not drink alcohol or use drugs.   Family History:  The patient's family history includes Heart attack in his father.   ROS:  Please see the history of present illness.   All other systems are personally reviewed and negative.    Exam:    Vital Signs:  None   Labs/Other Tests and Data Reviewed:    Recent Labs: No results found for requested labs within last 8760 hours.   Wt Readings from Last 3 Encounters:  11/20/17 237 lb (107.5 kg)  08/23/17 240 lb (108.9  kg)  08/19/17 238 lb (108 kg)     Other studies personally reviewed:  Last device remote is reviewed from [employer] PDF dated 08/07/18 which reveals normal device function, [facility] arrhythmias [facility]   ASSESSMENT & PLAN:    1.  Chronic systolic heart failure - his symptoms are class 2. He will continue his current meds.  2. ICD - his device was placed for primary prevention and has reached ERI.  3. Obesity - he has lost 65 lbs.  4. COVID 19 screen The patient  denies symptoms of COVID 19 at this time.  The importance of social distancing was discussed today.  Follow-up:  3-4 months after ICD gen change out Next remote: 10/20  Current medicines are reviewed at length with the patient today.   The patient does not have concerns regarding his medicines.  The following changes were made today:  none  Labs/ tests ordered today include: none No orders of the defined types were placed in this encounter.    Patient Risk:  after full review of this patients clinical status, I feel that they are at moderate risk at this time.  Today, I have spent 15 minutes with the patient with telehealth technology discussing as above .    Signed, Aydn Ferrara, MD  09/26/2018 9:12 AM     [facility] [address] [place] [address] [phone] (office) [phone] (fax)  

## 2018-09-30 ENCOUNTER — Encounter (HOSPITAL_COMMUNITY): Payer: Self-pay | Admitting: Internal Medicine

## 2018-09-30 ENCOUNTER — Other Ambulatory Visit: Payer: Self-pay

## 2018-09-30 ENCOUNTER — Ambulatory Visit (HOSPITAL_COMMUNITY)
Admission: RE | Admit: 2018-09-30 | Discharge: 2018-09-30 | Disposition: A | Discharge status: Home / Self Care | Payer: Medicare Other | Attending: Internal Medicine | Admitting: Internal Medicine

## 2018-09-30 ENCOUNTER — Encounter (HOSPITAL_COMMUNITY)
Admission: RE | Disposition: A | Discharge status: Home / Self Care | Payer: Medicare Other | Source: Home / Self Care | Attending: Internal Medicine

## 2018-09-30 DIAGNOSIS — Z885 Allergy status to narcotic agent status: Secondary | ICD-10-CM | POA: Diagnosis not present

## 2018-09-30 DIAGNOSIS — Z955 Presence of coronary angioplasty implant and graft: Secondary | ICD-10-CM | POA: Insufficient documentation

## 2018-09-30 DIAGNOSIS — I110 Hypertensive heart disease with heart failure: Secondary | ICD-10-CM | POA: Diagnosis not present

## 2018-09-30 DIAGNOSIS — Z4502 Encounter for adjustment and management of automatic implantable cardiac defibrillator: Secondary | ICD-10-CM | POA: Insufficient documentation

## 2018-09-30 DIAGNOSIS — Z951 Presence of aortocoronary bypass graft: Secondary | ICD-10-CM | POA: Insufficient documentation

## 2018-09-30 DIAGNOSIS — Z006 Encounter for examination for normal comparison and control in clinical research program: Secondary | ICD-10-CM | POA: Diagnosis not present

## 2018-09-30 DIAGNOSIS — Z8249 Family history of ischemic heart disease and other diseases of the circulatory system: Secondary | ICD-10-CM | POA: Insufficient documentation

## 2018-09-30 DIAGNOSIS — E785 Hyperlipidemia, unspecified: Secondary | ICD-10-CM | POA: Insufficient documentation

## 2018-09-30 DIAGNOSIS — Z7901 Long term (current) use of anticoagulants: Secondary | ICD-10-CM | POA: Insufficient documentation

## 2018-09-30 DIAGNOSIS — E669 Obesity, unspecified: Secondary | ICD-10-CM | POA: Diagnosis not present

## 2018-09-30 DIAGNOSIS — J45909 Unspecified asthma, uncomplicated: Secondary | ICD-10-CM | POA: Diagnosis not present

## 2018-09-30 DIAGNOSIS — Z79899 Other long term (current) drug therapy: Secondary | ICD-10-CM | POA: Diagnosis not present

## 2018-09-30 DIAGNOSIS — I5022 Chronic systolic (congestive) heart failure: Secondary | ICD-10-CM | POA: Diagnosis not present

## 2018-09-30 DIAGNOSIS — Z7951 Long term (current) use of inhaled steroids: Secondary | ICD-10-CM | POA: Diagnosis not present

## 2018-09-30 DIAGNOSIS — Z888 Allergy status to other drugs, medicaments and biological substances status: Secondary | ICD-10-CM | POA: Diagnosis not present

## 2018-09-30 DIAGNOSIS — Z6835 Body mass index (BMI) 35.0-35.9, adult: Secondary | ICD-10-CM | POA: Diagnosis not present

## 2018-09-30 DIAGNOSIS — I2510 Atherosclerotic heart disease of native coronary artery without angina pectoris: Secondary | ICD-10-CM | POA: Insufficient documentation

## 2018-09-30 DIAGNOSIS — I4891 Unspecified atrial fibrillation: Secondary | ICD-10-CM | POA: Insufficient documentation

## 2018-09-30 DIAGNOSIS — E7800 Pure hypercholesterolemia, unspecified: Secondary | ICD-10-CM | POA: Diagnosis not present

## 2018-09-30 HISTORY — PX: ICD GENERATOR CHANGEOUT: EP1231

## 2018-09-30 LAB — SURGICAL PCR SCREEN
MRSA, PCR: NEGATIVE
Staphylococcus aureus: NEGATIVE

## 2018-09-30 LAB — POTASSIUM: Potassium: 4.7 mmol/L (ref 3.5–5.1)

## 2018-09-30 SURGERY — ICD GENERATOR CHANGEOUT

## 2018-09-30 MED ORDER — ONDANSETRON HCL 4 MG/2ML IJ SOLN: 4.0000 mg | Freq: Four times a day (QID) | INTRAMUSCULAR | Status: DC | PRN

## 2018-09-30 MED ORDER — FENTANYL CITRATE (PF) 100 MCG/2ML IJ SOLN
INTRAMUSCULAR | Status: AC
  Filled 2018-09-30: qty 2

## 2018-09-30 MED ORDER — MIDAZOLAM HCL 5 MG/5ML IJ SOLN
INTRAMUSCULAR | Status: AC
  Filled 2018-09-30: qty 5

## 2018-09-30 MED ORDER — MUPIROCIN 2 % EX OINT
TOPICAL_OINTMENT | Freq: Once | CUTANEOUS | Status: AC
  Administered 2018-09-30: 1 via NASAL
  Filled 2018-09-30 (×2): qty 22

## 2018-09-30 MED ORDER — LIDOCAINE HCL (PF) 1 % IJ SOLN
INTRAMUSCULAR | Status: AC
  Filled 2018-09-30: qty 60

## 2018-09-30 MED ORDER — CHLORHEXIDINE GLUCONATE 4 % EX LIQD
60.0000 mL | Freq: Once | CUTANEOUS | Status: DC
  Filled 2018-09-30: qty 60

## 2018-09-30 MED ORDER — LIDOCAINE HCL (PF) 1 % IJ SOLN
INTRAMUSCULAR | Status: DC | PRN
  Administered 2018-09-30: 60 mL

## 2018-09-30 MED ORDER — CEFAZOLIN SODIUM-DEXTROSE 2-4 GM/100ML-% IV SOLN
2.0000 g | INTRAVENOUS | Status: AC
  Administered 2018-09-30: 10:00:00 2 g via INTRAVENOUS
  Filled 2018-09-30: qty 100

## 2018-09-30 MED ORDER — SODIUM CHLORIDE 0.9 % IV SOLN
80.0000 mg | INTRAVENOUS | Status: AC
  Administered 2018-09-30: 11:00:00 80 mg
  Filled 2018-09-30: qty 2

## 2018-09-30 MED ORDER — SODIUM CHLORIDE 0.9 % IV SOLN: INTRAVENOUS | Status: DC

## 2018-09-30 MED ORDER — CEFAZOLIN SODIUM-DEXTROSE 2-4 GM/100ML-% IV SOLN
INTRAVENOUS | Status: AC
  Filled 2018-09-30: qty 100

## 2018-09-30 MED ORDER — MIDAZOLAM HCL 5 MG/5ML IJ SOLN
INTRAMUSCULAR | Status: DC | PRN
  Administered 2018-09-30 (×2): 1 mg via INTRAVENOUS

## 2018-09-30 MED ORDER — FENTANYL CITRATE (PF) 100 MCG/2ML IJ SOLN
INTRAMUSCULAR | Status: DC | PRN
  Administered 2018-09-30: 25 ug via INTRAVENOUS

## 2018-09-30 MED ORDER — SODIUM CHLORIDE 0.9 % IV SOLN
INTRAVENOUS | Status: AC
  Filled 2018-09-30: qty 2

## 2018-09-30 MED ORDER — ACETAMINOPHEN 325 MG PO TABS: 325.0000 mg | ORAL_TABLET | ORAL | Status: DC | PRN

## 2018-09-30 SURGICAL SUPPLY — 5 items
CABLE SURGICAL S-101-97-12 (CABLE) ×3 IMPLANT
ICD EVERA XT MRI DF1  DDMB1D1 (ICD Generator) ×2 IMPLANT
ICD EVERA XT MRI DF1 DDMB1D1 (ICD Generator) ×1 IMPLANT
PAD PRO RADIOLUCENT 2001M-C (PAD) ×3 IMPLANT
TRAY PACEMAKER INSERTION (PACKS) ×3 IMPLANT

## 2018-09-30 NOTE — Interval H&P Note (Signed)
History and Physical Interval Note:  09/30/2018 9:50 AM  Douglas [name]  has presented today for surgery, with the diagnosis of ERI.  The various methods of treatment have been discussed with the patient and family. After consideration of risks, benefits and other options for treatment, the patient has consented to  Procedure(s): ICD Justice (N/A) as a surgical intervention.  The patient's history has been reviewed, patient examined, no change in status, stable for surgery.  I have reviewed the patient's chart and labs.  Questions were answered to the patient's satisfaction.     Cristopher Peru

## 2018-09-30 NOTE — Discharge Instructions (Signed)
Pacemaker Battery Change, Care After °This sheet gives you information about how to care for yourself after your procedure. Your health care provider may also give you more specific instructions. If you have problems or questions, contact your health care provider. °What can I expect after the procedure? °After your procedure, it is common to have: °· Pain or soreness at the site where the pacemaker was inserted. °· Swelling at the site where the pacemaker was inserted. °Follow these instructions at home: °Incision care ° °· Keep the incision clean and dry. °? Do not take baths, swim, or use a hot tub until your health care provider approves. °? You may shower the day after your procedure, or as directed by your health care provider. °? Pat the area dry with a clean towel. Do not rub the area. This may cause bleeding. °· Follow instructions from your health care provider about how to take care of your incision. Make sure you: °? Wash your hands with soap and water before you change your bandage (dressing). If soap and water are not available, use hand sanitizer. °? Change your dressing as told by your health care provider. °? Leave stitches (sutures), skin glue, or adhesive strips in place. These skin closures may need to stay in place for 2 weeks or longer. If adhesive strip edges start to loosen and curl up, you may trim the loose edges. Do not remove adhesive strips completely unless your health care provider tells you to do that. °· Check your incision area every day for signs of infection. Check for: °? More redness, swelling, or pain. °? More fluid or blood. °? Warmth. °? Pus or a bad smell. °Activity °· Do not lift anything that is heavier than 10 lb (4.5 kg) until your health care provider says it is okay to do so. °· For the first 2 weeks, or as long as told by your health care provider: °? Avoid lifting your left arm higher than your shoulder. °? Be gentle when you move your arms over your head. It is okay  to raise your arm to comb your hair. °? Avoid strenuous exercise. °· Ask your health care provider when it is okay to: °? Resume your normal activities. °? Return to work or school. °? Resume sexual activity. °Eating and drinking °· Eat a heart-healthy diet. This should include plenty of fresh fruits and vegetables, whole grains, low-fat dairy products, and lean protein like chicken and fish. °· Limit alcohol intake to no more than 1 drink a day for non-pregnant women and 2 drinks a day for men. One drink equals 12 oz of beer, 5 oz of wine, or 1½ oz of hard liquor. °· Check ingredients and nutrition facts on packaged foods and beverages. Avoid the following types of food: °? Food that is high in salt (sodium). °? Food that is high in saturated fat, like full-fat dairy or red meat. °? Food that is high in trans fat, like fried food. °? Food and drinks that are high in sugar. °Lifestyle °· Do not use any products that contain nicotine or tobacco, such as cigarettes and e-cigarettes. If you need help quitting, ask your health care provider. °· Take steps to manage and control your weight. °· Get regular exercise. Aim for 150 minutes of moderate-intensity exercise (such as walking or yoga) or 75 minutes of vigorous exercise (such as running or swimming) each week. °· Manage other health problems, such as diabetes or high blood pressure. Ask your health   care provider how you can manage these conditions. °General instructions °· Do not drive for 24 hours after your procedure if you were given a medicine to help you relax (sedative). °· Take over-the-counter and prescription medicines only as told by your health care provider. °· Avoid putting pressure on the area where the pacemaker was placed. °· If you need an MRI after your pacemaker has been placed, be sure to tell the health care provider who orders the MRI that you have a pacemaker. °· Avoid close and prolonged exposure to electrical devices that have strong  magnetic fields. These include: °? Cell phones. Avoid keeping them in a pocket near the pacemaker, and try using the ear opposite the pacemaker. °? MP3 players. °? Household appliances, like microwaves. °? Metal detectors. °? Electric generators. °? High-tension wires. °· Keep all follow-up visits as directed by your health care provider. This is important. °Contact a health care provider if: °· You have pain at the incision site that is not relieved by over-the-counter or prescription medicines. °· You have any of these around your incision site or coming from it: °? More redness, swelling, or pain. °? Fluid or blood. °? Warmth to the touch. °? Pus or a bad smell. °· You have a fever. °· You feel brief, occasional palpitations, light-headedness, or any symptoms that you think might be related to your heart. °Get help right away if: °· You experience chest pain that is different from the pain at the pacemaker site. °· You develop a red streak that extends above or below the incision site. °· You experience shortness of breath. °· You have palpitations or an irregular heartbeat. °· You have light-headedness that does not go away quickly. °· You faint or have dizzy spells. °· Your pulse suddenly drops or increases rapidly and does not return to normal. °· You begin to gain weight and your legs and ankles swell. °Summary °· After your procedure, it is common to have pain, soreness, and some swelling where the pacemaker was inserted. °· Make sure to keep your incision clean and dry. Follow instructions from your health care provider about how to take care of your incision. °· Check your incision every day for signs of infection, such as more pain or swelling, pus or a bad smell, warmth, or leaking fluid and blood. °· Avoid strenuous exercise and lifting your left arm higher than your shoulder for 2 weeks, or as long as told by your health care provider. °This information is not intended to replace advice given to you by  your health care provider. Make sure you discuss any questions you have with your health care provider. °Document Released: 12/03/2012 Document Revised: 01/25/2017 Document Reviewed: 01/05/2016 °Elsevier Patient Education © 2020 [employer] Inc[employer] ° ° °Moderate Conscious Sedation, Adult, Care After °These instructions provide you with information about caring for yourself after your procedure. Your health care provider may also give you more specific instructions. Your treatment has been planned according to current medical practices, but problems sometimes occur. Call your health care provider if you have any problems or questions after your procedure. °What can I expect after the procedure? °After your procedure, it is common: °· To feel sleepy for several hours. °· To feel clumsy and have poor balance for several hours. °· To have poor judgment for several hours. °· To vomit if you eat too soon. °Follow these instructions at home: °For at least 24 hours after the procedure: ° °· Do not: °? Participate in   activities where you could fall or become injured. °? Drive. °? Use heavy machinery. °? Drink alcohol. °? Take sleeping pills or medicines that cause drowsiness. °? Make important decisions or sign legal documents. °? Take care of children on your own. °· Rest. °Eating and drinking °· Follow the diet recommended by your health care provider. °· If you vomit: °? Drink water, juice, or soup when you can drink without vomiting. °? Make sure you have little or no nausea before eating solid foods. °General instructions °· Have a responsible adult stay with you until you are awake and alert. °· Take over-the-counter and prescription medicines only as told by your health care provider. °· If you smoke, do not smoke without supervision. °· Keep all follow-up visits as told by your health care provider. This is important. °Contact a health care provider if: °· You keep feeling nauseous or you keep vomiting. °· You feel  light-headed. °· You develop a rash. °· You have a fever. °Get help right away if: °· You have trouble breathing. °This information is not intended to replace advice given to you by your health care provider. Make sure you discuss any questions you have with your health care provider. °Document Released: 12/03/2012 Document Revised: 01/25/2017 Document Reviewed: 06/04/2015 °Elsevier Patient Education © 2020 [employer] Inc. ° °

## 2018-10-02 ENCOUNTER — Telehealth: Payer: Self-pay

## 2018-10-02 NOTE — Telephone Encounter (Signed)
Pt states he tried on several occasions to send a transmission with his home monitor to pair it with his new ICD however, it keep giving him the error code 3230. I gave him the number to [employer] support to get additional help. Pt agreed to call tomorrow to let me know if they going to send him a new monitor.

## 2018-10-03 NOTE — Telephone Encounter (Signed)
Pt called stating that [employer] is sending him a new monitor. It will take 7-10 business days. Pt states he will call when he get his new monitor.

## 2018-10-08 ENCOUNTER — Telehealth: Payer: Self-pay

## 2018-10-08 NOTE — Telephone Encounter (Signed)
Pt states he has not receive his new home monitor yet.

## 2018-10-08 NOTE — Telephone Encounter (Signed)
    COVID-19 Pre-Screening Questions:  . In the past 7 to 10 days have you had a cough,  shortness of breath, headache, congestion, fever (100 or greater) body aches, chills, sore throat, or sudden loss of taste or sense of smell? no . Have you been around anyone with known Covid 19. no . Have you been around anyone who is awaiting Covid 19 test results in the past 7 to 10 days? no . Have you been around anyone who has been exposed to Covid 19, or has mentioned symptoms of Covid 19 within the past 7 to 10 days? no  If you have any concerns/questions about symptoms patients report during screening (either on the phone or at threshold). Contact the provider seeing the patient or DOD for further guidance.  If neither are available contact a member of the leadership team.        Pt answered no to all covid-19 prescreening questions. I asked the pt to wear a mask if he has one. I told the pt we are reducing the number of people coming into the office and if he can physically walk into his appointment to please come alone. I told him if anything changes between now and his appointment time to call the office. The pt verbalized understanding.

## 2018-10-09 ENCOUNTER — Ambulatory Visit (INDEPENDENT_AMBULATORY_CARE_PROVIDER_SITE_OTHER): Payer: Medicare Other | Admitting: *Deleted

## 2018-10-09 ENCOUNTER — Other Ambulatory Visit: Payer: Self-pay

## 2018-10-09 DIAGNOSIS — I472 Ventricular tachycardia, unspecified: Secondary | ICD-10-CM

## 2018-10-09 LAB — CUP PACEART INCLINIC DEVICE CHECK
Battery Remaining Longevity: 131 mo
Battery Voltage: 3.09 V
Brady Statistic AP VP Percent: 0 %
Brady Statistic AP VS Percent: 0 %
Brady Statistic AS VP Percent: 2.79 %
Brady Statistic AS VS Percent: 97.21 %
Brady Statistic RA Percent Paced: 0 %
Brady Statistic RV Percent Paced: 3.84 %
Date Time Interrogation Session: 20200813155010
HighPow Impedance: 77 Ohm
Implantable Lead Implant Date: 20060116
Implantable Lead Implant Date: 20130715
Implantable Lead Location: 753859
Implantable Lead Location: 753860
Implantable Lead Model: 5076
Implantable Lead Model: 6935
Implantable Pulse Generator Implant Date: 20200804
Lead Channel Impedance Value: 494 Ohm
Lead Channel Impedance Value: 627 Ohm
Lead Channel Impedance Value: 988 Ohm
Lead Channel Pacing Threshold Amplitude: 0.5 V
Lead Channel Pacing Threshold Pulse Width: 0.4 ms
Lead Channel Sensing Intrinsic Amplitude: 20 mV
Lead Channel Setting Pacing Amplitude: 2.5 V
Lead Channel Setting Pacing Pulse Width: 0.4 ms
Lead Channel Setting Sensing Sensitivity: 0.3 mV

## 2018-10-09 NOTE — Patient Instructions (Signed)
Set up new monitor when you arrive home.

## 2018-10-09 NOTE — Progress Notes (Signed)
Wound check appointment. Steri-strips removed. Wound without redness or edema. Incision edges approximated, wound well healed. Normal device function. Generator change with chronic leads with thresholds, sensing, and impedances consistent with implant measurements. Device programmed at appropriate safety margins. Histogram distribution appropriate for patient and level of activity. No ventricular arrhythmias noted. Chronic AF , + OAS. Patient educated about wound care, arm mobility, lifting restrictions, shock plan. Remote monitor arrived today and patient will set up home monitor, next remote transmission scheduled for 12/30/18. F/U with Dr Lovena Le on 01/13/19.

## 2018-10-10 NOTE — Telephone Encounter (Signed)
Spoke w/ pt and he wanted to make sure his remote transmission was received. Informed pt that it was received. He stated that he will be sending the old one back today.

## 2018-10-10 NOTE — Telephone Encounter (Signed)
Follow up    Patient is calling to advise that he received the monitor. He would like to know whether or not the test was received.

## 2018-10-15 ENCOUNTER — Other Ambulatory Visit: Payer: Self-pay | Admitting: Cardiology
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
## 2018-10-21 ENCOUNTER — Ambulatory Visit: Payer: Medicare Other | Admitting: Cardiology

## 2018-10-26 ENCOUNTER — Other Ambulatory Visit: Payer: Self-pay | Admitting: Cardiology

## 2018-10-28 NOTE — Progress Notes (Signed)
HPI The patient presents for followup of his cardiomyopathy and atrial fibrillation  with a rapid ventricular rate. He was started on Xarelto in the past.  However, he did not want to remain on anticoagulation. He also didn't feel well on Cardizem so we stopped this.   At the last visit he had his pacemaker replaced for ERI.    He walks Max twice daily.  He has been adherent to diet advice and taking his medications.  Has been tolerating his meds.  He denies any ongoing cardiovascular symptoms.The patient denies any new symptoms such as chest discomfort, neck or arm discomfort. There has been no new shortness of breath, PND or orthopnea. There have been no reported palpitations, presyncope or syncope.   Allergies  Allergen Reactions  . Codeine Hives and Itching  . Xarelto [Rivaroxaban] Other (See Comments)    Bleeding ulcers  . Vioxx [Rofecoxib] Palpitations and Other (See Comments)    Caused massive heart attack    Current Outpatient Medications  Medication Sig Dispense Refill  . albuterol (PROVENTIL HFA;VENTOLIN HFA) 108 (90 Base) MCG/ACT inhaler Inhale 2 puffs into the lungs every 6 (six) hours as needed for wheezing or shortness of breath.     . alprazolam (XANAX) 2 MG tablet Take 1-2 mg by mouth 4 (four) times daily as needed for anxiety.     Marland Kitchen aspirin EC 81 MG tablet Take 81 mg by mouth daily.    Marland Kitchen atorvastatin (LIPITOR) 80 MG tablet Take 1 tablet (80 mg total) by mouth daily. (Patient taking differently: Take 80 mg by mouth every evening. ) 90 tablet 3  . bethanechol (URECHOLINE) 10 MG tablet Take 1 tablet (10 mg total) by mouth 3 (three) times daily. 30 tablet 0  . Carboxymethylcellulose Sodium (ARTIFICIAL TEARS OP) Place 1 drop into the right eye 3 (three) times daily.    . citalopram (CELEXA) 20 MG tablet Take 20 mg by mouth daily.    Marland Kitchen ELIQUIS 5 MG TABS tablet TAKE ONE TABLET BY MOUTH TWICE DAILY (Patient taking differently: Take 5 mg by mouth 2 (two) times daily. ) 120 tablet  0  . Emollient (GOLD BOND MEDICATED BODY) 5-0.5 % LOTN Apply 1 application topically at bedtime. Apply to left wrist    . fluticasone (FLONASE) 50 MCG/ACT nasal spray Place 1 spray into both nostrils daily.    . Fluticasone-Salmeterol (ADVAIR) 250-50 MCG/DOSE AEPB Inhale 1 puff into the lungs 2 (two) times daily.    . lansoprazole (PREVACID) 30 MG capsule Take 30 mg by mouth daily.      Marland Kitchen lisinopril (PRINIVIL,ZESTRIL) 2.5 MG tablet TAKE ONE (1) TABLET EACH DAY (Patient taking differently: Take 2.5 mg by mouth daily. ) 90 tablet 3  . loratadine (CLARITIN) 10 MG tablet Take 10 mg by mouth at bedtime.    . meloxicam (MOBIC) 15 MG tablet Take 15 mg by mouth daily.    . metoprolol succinate (TOPROL-XL) 100 MG 24 hr tablet TAKE ONE (1) TABLET EACH DAY 90 tablet 0  . Multiple Vitamin (MULTIVITAMIN WITH MINERALS) TABS Take 1 tablet by mouth daily.    . Omega-3 Fatty Acids (FISH OIL) 1200 MG CAPS Take 1,200 mg by mouth daily.    Marland Kitchen oxyCODONE-acetaminophen (PERCOCET) 10-325 MG per tablet Take 1 tablet by mouth every 6 (six) hours as needed for pain.     . polyethylene glycol (MIRALAX / GLYCOLAX) packet Take 17 g by mouth daily as needed for mild constipation. 14 each 0  . potassium  chloride SA (K-DUR,KLOR-CON) 20 MEQ tablet Take 1 tablet (20 mEq total) by mouth daily. 90 tablet 3  . promethazine (PHENERGAN) 25 MG tablet Take 12.5-25 mg by mouth every 8 (eight) hours as needed for nausea or vomiting.     Marland Kitchen. spironolactone (ALDACTONE) 25 MG tablet TAKE ONE (1) TABLET EACH DAY (Patient taking differently: Take 25 mg by mouth daily. ) 90 tablet 3  . tamsulosin (FLOMAX) 0.4 MG CAPS capsule Take 1 capsule (0.4 mg total) by mouth daily. 30 capsule 6  . tiZANidine (ZANAFLEX) 4 MG tablet Take 4 mg by mouth 2 (two) times daily.     Marland Kitchen. torsemide (DEMADEX) 20 MG tablet Take 1 tablet (20 mg total) by mouth daily. Please keep upcoming appt in August for future refills. Thank you 30 tablet 0  . traZODone (DESYREL) 50 MG  tablet Take 50 mg by mouth at bedtime as needed for sleep.      No current facility-administered medications for this visit.     Past Medical History:  Diagnosis Date  . A-fib (HCC)   . AICD (automatic cardioverter/defibrillator) present   . Anemia   . Asthma   . CAD (coronary artery disease)   . CHF (congestive heart failure) (HCC)   . Dyslipidemia   . Elevated cholesterol   . Hypertension     Past Surgical History:  Procedure Laterality Date  . CARDIAC CATHETERIZATION  1996   stent placement  . CARDIAC DEFIBRILLATOR PLACEMENT    . CORONARY ARTERY BYPASS GRAFT  2002   times 4  . CORONARY ARTERY BYPASS GRAFT    . EYE SURGERY    . ICD GENERATOR CHANGEOUT N/A 09/30/2018   Procedure: ICD GENERATOR CHANGEOUT;  Surgeon: Marinus Mawaylor, Gregg W, MD;  [facility];  Service: Cardiovascular;  Laterality: N/A;  . IMPLANTABLE CARDIOVERTER DEFIBRILLATOR (ICD) GENERATOR CHANGE N/A 09/10/2011   Procedure: ICD GENERATOR CHANGE;  Surgeon: Marinus MawGregg W Taylor, MD;  [facility];  Service: Cardiovascular;  Laterality: N/A;  . INSERT / REPLACE / REMOVE PACEMAKER    . LEFT HEART CATHETERIZATION WITH CORONARY ANGIOGRAM N/A 08/29/2011   Procedure: LEFT HEART CATHETERIZATION WITH CORONARY ANGIOGRAM;  Surgeon: Peter M SwazilandJordan, MD;  [facility];  Service: Cardiovascular;  Laterality: N/A;    ROS:     As stated in the HPI and negative for all other systems.  PHYSICAL EXAM BP (!) 90/50 (BP Location: Left Arm, Patient Position: Sitting, Cuff Size: Large)   Pulse 66   Temp (!) 97.2 F (36.2 C)   Ht 5\' 9"  (1.753 m)   Wt 252 lb (114.3 kg)   BMI 37.21 kg/m  GENERAL:  Well appearing NECK:  No jugular venous distention, waveform within normal limits, carotid upstroke brisk and symmetric, no bruits, no thyromegaly LUNGS:  Clear to auscultation bilaterally CHEST:  Well healed ICD scar.   HEART:  PMI not displaced or sustained,S1 and S2 within normal limits, no S3, no S4, no clicks, no  rubs, no murmurs ABD:  Flat, positive bowel sounds normal in frequency in pitch, no bruits, no rebound, no guarding, no midline pulsatile mass, no hepatomegaly, no splenomegaly EXT:  2 plus pulses throughout, no edema, no cyanosis no clubbing   EKG: NA  ASSESSMENT AND PLAN  CARDIOMYOPATHY:   He seems to be euvolemic.  He will not tolerate up titration of medications because of his low blood pressure.  He is otherwise doing well.  No change in therapy.  I am  proud of his adherence to diet and medications.  ATRIAL FIB:   Mr. SARIM PROPER has a CHA2DS2 - VASc score of 3.  He tolerates this rhythm and does not really notice it.  No change in therapy.    CAD:  The patient has no new sypmtoms.  No further cardiovascular testing is indicated.  We will continue with aggressive risk reduction and meds as listed.  DYSLIPIDEMIA:    His last LDL was 92.  I would like him to get a repeat at his primary care office.   HTN:  Blood pressure is running low.  He will continue the meds as listed.  This is being managed in the context of treating his heart failure.  OBESITY: He is really worked on weight loss and I am proud of this and encouraged more of the same.

## 2018-10-30 ENCOUNTER — Ambulatory Visit (INDEPENDENT_AMBULATORY_CARE_PROVIDER_SITE_OTHER): Payer: Medicare Other | Admitting: Cardiology

## 2018-10-30 ENCOUNTER — Encounter: Payer: Self-pay | Admitting: Cardiology

## 2018-10-30 ENCOUNTER — Other Ambulatory Visit: Payer: Self-pay

## 2018-10-30 VITALS — BP 90/50 | HR 66 | Temp 97.2°F | Ht 69.0 in | Wt 252.0 lb

## 2018-10-30 DIAGNOSIS — Z95810 Presence of automatic (implantable) cardiac defibrillator: Secondary | ICD-10-CM

## 2018-10-30 DIAGNOSIS — E785 Hyperlipidemia, unspecified: Secondary | ICD-10-CM | POA: Diagnosis not present

## 2018-10-30 DIAGNOSIS — I10 Essential (primary) hypertension: Secondary | ICD-10-CM

## 2018-10-30 DIAGNOSIS — I482 Chronic atrial fibrillation, unspecified: Secondary | ICD-10-CM | POA: Diagnosis not present

## 2018-10-30 DIAGNOSIS — I255 Ischemic cardiomyopathy: Secondary | ICD-10-CM

## 2018-10-30 NOTE — Patient Instructions (Signed)
Medication Instructions:  NO CHANGE If you need a refill on your cardiac medications before your next appointment, please call your pharmacy.   Lab work: Your physician recommends that you return for lab work [place] If you have labs (blood work) drawn today and your tests are completely normal, you will receive your results only by: Marland Kitchen MyChart Message (if you have MyChart) OR . A paper copy in the mail If you have any lab test that is abnormal or we need to change your treatment, we will call you to review the results.  Follow-Up: At [facility], you and your health needs are our priority.  As part of our continuing mission to provide you with exceptional heart care, we have created designated Provider Care Teams.  These Care Teams include your primary Cardiologist (physician) and Advanced Practice Providers (APPs -  Physician Assistants and Nurse Practitioners) who all work together to provide you with the care you need, when you need it. Your physician wants you to follow-up in: [place] will receive a reminder letter in the mail two months in advance. If you don't receive a letter, please call our office to schedule the follow-up appointment.

## 2018-12-13 DIAGNOSIS — Z23 Encounter for immunization: Secondary | ICD-10-CM | POA: Insufficient documentation

## 2018-12-23 ENCOUNTER — Other Ambulatory Visit: Payer: Self-pay | Admitting: Cardiology

## 2018-12-30 ENCOUNTER — Ambulatory Visit (INDEPENDENT_AMBULATORY_CARE_PROVIDER_SITE_OTHER): Payer: Medicare Other | Admitting: *Deleted

## 2018-12-30 ENCOUNTER — Telehealth: Payer: Self-pay

## 2018-12-30 ENCOUNTER — Telehealth: Payer: Self-pay | Admitting: Emergency Medicine

## 2018-12-30 DIAGNOSIS — I4891 Unspecified atrial fibrillation: Secondary | ICD-10-CM | POA: Diagnosis not present

## 2018-12-30 DIAGNOSIS — I472 Ventricular tachycardia, unspecified: Secondary | ICD-10-CM

## 2018-12-30 LAB — CUP PACEART REMOTE DEVICE CHECK
Battery Remaining Longevity: 129 mo
Battery Voltage: 3.1 V
Brady Statistic AP VP Percent: 0 %
Brady Statistic AP VS Percent: 0 %
Brady Statistic AS VP Percent: 8.59 %
Brady Statistic AS VS Percent: 91.41 %
Brady Statistic RA Percent Paced: 0 %
Brady Statistic RV Percent Paced: 11.32 %
Date Time Interrogation Session: 20201103072824
HighPow Impedance: 71 Ohm
Implantable Lead Implant Date: 20060116
Implantable Lead Implant Date: 20130715
Implantable Lead Location: 753859
Implantable Lead Location: 753860
Implantable Lead Model: 5076
Implantable Lead Model: 6935
Implantable Pulse Generator Implant Date: 20200804
Lead Channel Impedance Value: 456 Ohm
Lead Channel Impedance Value: 570 Ohm
Lead Channel Impedance Value: 988 Ohm
Lead Channel Pacing Threshold Amplitude: 0.375 V
Lead Channel Pacing Threshold Pulse Width: 0.4 ms
Lead Channel Sensing Intrinsic Amplitude: 1.125 mV
Lead Channel Sensing Intrinsic Amplitude: 29.875 mV
Lead Channel Sensing Intrinsic Amplitude: 29.875 mV
Lead Channel Setting Pacing Amplitude: 2.5 V
Lead Channel Setting Pacing Pulse Width: 0.4 ms
Lead Channel Setting Sensing Sensitivity: 0.3 mV

## 2018-12-30 NOTE — Telephone Encounter (Signed)
Alert for increased Optivol and decreased thoracic impedence. Consider referral to [facility] clinic.

## 2018-12-30 NOTE — Telephone Encounter (Signed)
Referred to ICM clinic by Jodi Geralds, RN due to 12/30/2018 Optivol suggesting fluid accumulation.  Spoke with patient and ICM intro given.  He agreed to monthly ICM call and said he does not feel like he has any fluid.  He is walking 2 miles a day and following low salt diet.  He uses dash for seasoning and occasionally eats in a restaurant. Scheduled remote transmission for 01/13/2019.  Provided direct ICM number and encouraged to call for fluid symptoms.

## 2019-01-13 ENCOUNTER — Ambulatory Visit (INDEPENDENT_AMBULATORY_CARE_PROVIDER_SITE_OTHER): Payer: Medicare Other | Admitting: Internal Medicine

## 2019-01-13 ENCOUNTER — Ambulatory Visit (INDEPENDENT_AMBULATORY_CARE_PROVIDER_SITE_OTHER): Payer: Medicare Other

## 2019-01-13 ENCOUNTER — Encounter: Payer: Self-pay | Admitting: Internal Medicine

## 2019-01-13 VITALS — BP 131/80 | HR 83 | Temp 96.9°F | Ht 69.0 in | Wt 240.0 lb

## 2019-01-13 DIAGNOSIS — I5022 Chronic systolic (congestive) heart failure: Secondary | ICD-10-CM

## 2019-01-13 DIAGNOSIS — I4819 Other persistent atrial fibrillation: Secondary | ICD-10-CM

## 2019-01-13 DIAGNOSIS — Z95810 Presence of automatic (implantable) cardiac defibrillator: Secondary | ICD-10-CM | POA: Diagnosis not present

## 2019-01-13 DIAGNOSIS — I255 Ischemic cardiomyopathy: Secondary | ICD-10-CM

## 2019-01-13 NOTE — Progress Notes (Signed)
HPI Mr. Nall returns today for ongoing evaluation and management of chronic systolic heart failure, PAF, s/p ICD with recent ICD gen change out. In the past he has had trouble with eating too much sodium. He has tried to reduce his salt intake and notes that his dyspnea and swelling are much improved.  Allergies  Allergen Reactions  . Codeine Hives and Itching  . Xarelto [Rivaroxaban] Other (See Comments)    Bleeding ulcers  . Vioxx [Rofecoxib] Palpitations and Other (See Comments)    Caused massive heart attack     Current Outpatient Medications  Medication Sig Dispense Refill  . albuterol (PROVENTIL HFA;VENTOLIN HFA) 108 (90 Base) MCG/ACT inhaler Inhale 2 puffs into the lungs every 6 (six) hours as needed for wheezing or shortness of breath.     . alprazolam (XANAX) 2 MG tablet Take 1-2 mg by mouth 4 (four) times daily as needed for anxiety.     Marland Kitchen aspirin EC 81 MG tablet Take 81 mg by mouth daily.    Marland Kitchen atorvastatin (LIPITOR) 80 MG tablet Take 1 tablet (80 mg total) by mouth daily. (Patient taking differently: Take 80 mg by mouth every evening. ) 90 tablet 3  . Carboxymethylcellulose Sodium (ARTIFICIAL TEARS OP) Place 1 drop into the right eye 3 (three) times daily.    . citalopram (CELEXA) 20 MG tablet Take 20 mg by mouth daily.    Marland Kitchen ELIQUIS 5 MG TABS tablet TAKE ONE TABLET BY MOUTH TWICE DAILY (Patient taking differently: Take 5 mg by mouth 2 (two) times daily. ) 120 tablet 0  . Emollient (GOLD BOND MEDICATED BODY) 5-0.5 % LOTN Apply 1 application topically at bedtime. Apply to left wrist    . fluticasone (FLONASE) 50 MCG/ACT nasal spray Place 1 spray into both nostrils daily.    . Fluticasone-Salmeterol (ADVAIR) 250-50 MCG/DOSE AEPB Inhale 1 puff into the lungs 2 (two) times daily.    . lansoprazole (PREVACID) 30 MG capsule Take 30 mg by mouth daily.      Marland Kitchen lisinopril (PRINIVIL,ZESTRIL) 2.5 MG tablet TAKE ONE (1) TABLET EACH DAY (Patient taking differently: Take 2.5 mg by  mouth daily. ) 90 tablet 3  . loratadine (CLARITIN) 10 MG tablet Take 10 mg by mouth at bedtime.    . meloxicam (MOBIC) 15 MG tablet Take 15 mg by mouth daily.    . metoprolol succinate (TOPROL-XL) 100 MG 24 hr tablet TAKE ONE (1) TABLET EACH DAY 90 tablet 0  . Multiple Vitamin (MULTIVITAMIN WITH MINERALS) TABS Take 1 tablet by mouth daily.    . Omega-3 Fatty Acids (FISH OIL) 1200 MG CAPS Take 1,200 mg by mouth daily.    Marland Kitchen oxyCODONE-acetaminophen (PERCOCET) 10-325 MG per tablet Take 1 tablet by mouth every 6 (six) hours as needed for pain.     . polyethylene glycol (MIRALAX / GLYCOLAX) packet Take 17 g by mouth daily as needed for mild constipation. 14 each 0  . potassium chloride SA (K-DUR,KLOR-CON) 20 MEQ tablet Take 1 tablet (20 mEq total) by mouth daily. 90 tablet 3  . promethazine (PHENERGAN) 25 MG tablet Take 12.5-25 mg by mouth every 8 (eight) hours as needed for nausea or vomiting.     Marland Kitchen spironolactone (ALDACTONE) 25 MG tablet TAKE ONE (1) TABLET EACH DAY (Patient taking differently: Take 25 mg by mouth daily. ) 90 tablet 3  . tamsulosin (FLOMAX) 0.4 MG CAPS capsule Take 1 capsule (0.4 mg total) by mouth daily. 30 capsule 6  .  tiZANidine (ZANAFLEX) 4 MG tablet Take 4 mg by mouth 2 (two) times daily.     Marland Kitchen torsemide (DEMADEX) 20 MG tablet TAKE ONE (1) TABLET EACH DAY 90 tablet 3  . traZODone (DESYREL) 50 MG tablet Take 50 mg by mouth at bedtime as needed for sleep.      No current facility-administered medications for this visit.      Past Medical History:  Diagnosis Date  . A-fib (HCC)   . AICD (automatic cardioverter/defibrillator) present   . Anemia   . Asthma   . CAD (coronary artery disease)   . CHF (congestive heart failure) (HCC)   . Dyslipidemia   . Elevated cholesterol   . Hypertension     ROS:   All systems reviewed and negative except as noted in the HPI.   Past Surgical History:  Procedure Laterality Date  . CARDIAC CATHETERIZATION  1996   stent placement   . CARDIAC DEFIBRILLATOR PLACEMENT    . CORONARY ARTERY BYPASS GRAFT  2002   times 4  . CORONARY ARTERY BYPASS GRAFT    . EYE SURGERY    . ICD GENERATOR CHANGEOUT N/A 09/30/2018   Procedure: ICD GENERATOR CHANGEOUT;  Surgeon: Marinus Maw, MD;  Location: [facility] INVASIVE CV LAB;  Service: Cardiovascular;  Laterality: N/A;  . IMPLANTABLE CARDIOVERTER DEFIBRILLATOR (ICD) GENERATOR CHANGE N/A 09/10/2011   Procedure: ICD GENERATOR CHANGE;  Surgeon: Marinus Maw, MD;  Location: [facility] CATH LAB;  Service: Cardiovascular;  Laterality: N/A;  . INSERT / REPLACE / REMOVE PACEMAKER    . LEFT HEART CATHETERIZATION WITH CORONARY ANGIOGRAM N/A 08/29/2011   Procedure: LEFT HEART CATHETERIZATION WITH CORONARY ANGIOGRAM;  Surgeon: Peter M Swaziland, MD;  Location: [facility] CATH LAB;  Service: Cardiovascular;  Laterality: N/A;     Family History  Problem Relation Age of Onset  . Heart attack Father      Social History   Socioeconomic History  . Marital status: Divorced    Spouse name: Not on file  . Number of children: Not on file  . Years of education: Not on file  . Highest education level: Not on file  Occupational History  . Not on file  Social Needs  . Financial resource strain: Not on file  . Food insecurity    Worry: Not on file    Inability: Not on file  . Transportation needs    Medical: Not on file    Non-medical: Not on file  Tobacco Use  . Smoking status: Never Smoker  . Smokeless tobacco: Never Used  Substance and Sexual Activity  . Alcohol use: Never    Frequency: Never  . Drug use: Never    Comment: marijuana  . Sexual activity: Not on file  Lifestyle  . Physical activity    Days per week: Not on file    Minutes per session: Not on file  . Stress: Not on file  Relationships  . Social Musician on phone: Not on file    Gets together: Not on file    Attends religious service: Not on file    Active member of club or organization: Not on file    Attends meetings of clubs  or organizations: Not on file    Relationship status: Not on file  . Intimate partner violence    Fear of current or ex partner: Not on file    Emotionally abused: Not on file    Physically abused: Not on file  Forced sexual activity: Not on file  Other Topics Concern  . Not on file  Social History Narrative   ** Merged History Encounter **         BP 131/80   Pulse 83   Temp (!) 96.9 F (36.1 C) (Temporal)   Ht 5\' 9"  (1.753 m)   Wt 240 lb (108.9 kg)   SpO2 98%   BMI 35.44 kg/m   Physical Exam:  Well appearing NAD HEENT: Unremarkable Neck:  No JVD, no thyromegally Lymphatics:  No adenopathy Back:  No CVA tenderness Lungs:  Clear with no wheezes HEART:  Regular rate rhythm, no murmurs, no rubs, no clicks Abd:  soft, positive bowel sounds, no organomegally, no rebound, no guarding Ext:  2 plus pulses, no edema, no cyanosis, no clubbing Skin:  No rashes no nodules Neuro:  CN II through XII intact, motor grossly intact  DEVICE  Normal device function.  See PaceArt for details.   Assess/Plan: 1. ICM - he denies anginal symptoms. He will continue his current meds. He is walking a couple of miles a day and is encouraged to continue doing so. 2. PAF - he is essentially asymptomatic. He will continue his anti-coagulation and rate control. He has not been in atrial fib in 3 months. 3. ICD - his Medtronic DDD ICD is working normally. We will recheck in several months. 4. Chronic systolic heart failure- he is walking and his symptoms are class 2. He will continue his current meds.  Leonia ReevesGregg ,M.D

## 2019-01-13 NOTE — Patient Instructions (Signed)
Medication Instructions:  Your physician recommends that you continue on your current medications as directed. Please refer to the Current Medication list given to you today.  *If you need a refill on your cardiac medications before your next appointment, please call your pharmacy*  Lab Work: NONE  If you have labs (blood work) drawn today and your tests are completely normal, you will receive your results only by: . MyChart Message (if you have MyChart) OR . A paper copy in the mail If you have any lab test that is abnormal or we need to change your treatment, we will call you to review the results.  Testing/Procedures: NONE   Follow-Up: At [facility], you and your health needs are our priority.  As part of our continuing mission to provide you with exceptional heart care, we have created designated Provider Care Teams.  These Care Teams include your primary Cardiologist (physician) and Advanced Practice Providers (APPs -  Physician Assistants and Nurse Practitioners) who all work together to provide you with the care you need, when you need it.  Your next appointment:   12 months  The format for your next appointment:   In Person  Provider:   Gregg Taylor, MD  Other Instructions Thank you for choosing [facility]!    

## 2019-01-14 NOTE — Progress Notes (Signed)
EPIC Encounter for ICM Monitoring  Patient Name: Douglas [name] is a 63 y.o. male Date: 01/14/2019 Primary Care Physican: Christain Sacramento, MD Primary Cardiologist: Hochrein Electrophysiologist: Lovena Le 01/13/2019 Weight: 240 lbs        1st remote transmission.  Heart Failure questions reviewed.  Pt asymptomatic.  He has been vigilant decreasing salt intake and avoiding fast food.  He has a black lab named Max.   Optivol Thoracic impedance normal for the past couple of weeks.   Prescribed: Torsemide 20 mg take 1 tablet daily.  Recommendations: Reinforced limiting salt intake to < 2000 mg daily.  Encouraged to call if experiencing fluid symptoms.  Follow-up plan: ICM clinic phone appointment on 02/23/2019.   91 day device clinic remote transmission 03/31/2019.    Copy of ICM check sent to Dr. Lovena Le.   3 month ICM trend: 01/13/2019    1 Year ICM trend:       Rosalene Billings, RN 01/14/2019 4:51 PM

## 2019-01-19 NOTE — Progress Notes (Signed)
Remote ICD transmission.   

## 2019-01-23 ENCOUNTER — Other Ambulatory Visit: Payer: Self-pay | Admitting: Cardiology

## 2019-02-23 ENCOUNTER — Ambulatory Visit (INDEPENDENT_AMBULATORY_CARE_PROVIDER_SITE_OTHER): Payer: Medicare Other

## 2019-02-23 ENCOUNTER — Other Ambulatory Visit: Payer: Self-pay | Admitting: Internal Medicine

## 2019-02-23 DIAGNOSIS — I5022 Chronic systolic (congestive) heart failure: Secondary | ICD-10-CM

## 2019-02-23 DIAGNOSIS — Z95810 Presence of automatic (implantable) cardiac defibrillator: Secondary | ICD-10-CM | POA: Diagnosis not present

## 2019-02-23 NOTE — Progress Notes (Signed)
EPIC Encounter for ICM Monitoring  Patient Name: Douglas [name] is a 63 y.o. male Date: 02/23/2019 Primary Care Physican: Christain Sacramento, MD Primary Cardiologist: Hochrein Electrophysiologist: Lovena Le 02/23/2019 Weight: 240 lbs                                                            Heart Failure questions reviewed.  Pt asymptomatic and it could be related to some ham he ate over Christmas holiday.  His dog, a black lab named Max died 12-Mar-2023.     Optivol Thoracic impedance normal.   Prescribed: Torsemide 20 mg take 1 tablet daily.  Labs: 09/30/2018 Potassium 4.7 09/26/2018 Creatinine 1.31, BUN 21, Potassium 5.7, Sodium 141, GFR 58-67 A complete set of results can be found in Results Review.  Recommendations: Reinforced limiting salt intake to < 2000 mg daily.  Encouraged to call if experiencing fluid symptoms.  Follow-up plan: ICM clinic phone appointment on 03/02/2019.   91 day device clinic remote transmission 03/31/2019.    Copy of ICM check sent to Dr. Lovena Le.   3 month ICM trend: 02/23/2019    1 Year ICM trend:       Rosalene Billings, RN 02/23/2019 4:59 PM

## 2019-03-02 ENCOUNTER — Ambulatory Visit: Payer: Medicare Other

## 2019-03-02 DIAGNOSIS — I5022 Chronic systolic (congestive) heart failure: Secondary | ICD-10-CM

## 2019-03-02 DIAGNOSIS — Z95810 Presence of automatic (implantable) cardiac defibrillator: Secondary | ICD-10-CM

## 2019-03-02 NOTE — Progress Notes (Signed)
EPIC Encounter for ICM Monitoring  Patient Name: Douglas [name] is a 64 y.o. male Date: 03/02/2019 Primary Care Physican: [name], MD Primary Cardiologist:Hochrein Electrophysiologist:Taylor 02/23/2019 Weight:240lbs    Attempted call to patient and unable to reach.   Transmission reviewed.   Pt reported on 12/28 he was not adhering to low salt diet during holidays.  OptivolThoracic impedance improving but still suggesting possible fluid accumulation since 02/23/2020.   Prescribed: Torsemide20 mg take 1 tablet daily.  Labs: 09/30/2018 Potassium 4.7 09/26/2018 Creatinine 1.31, BUN 21, Potassium 5.7, Sodium 141, GFR 58-67 A complete set of results can be found in Results Review  Recommendations: Unable to reach.    Follow-up plan: ICM clinic phone appointment on 03/16/2019 to recheck fluid levels.   91 day device clinic remote transmission 03/31/2019.  Office appt 05/07/2019 with Dr. Antoine Poche.    Copy of ICM check sent to Dr. [name] and Dr Antoine Poche for review and recommendations if needed.   3 month ICM trend: 03/02/2019    1 Year ICM trend:       Karie Soda, RN 03/02/2019 5:02 PM

## 2019-03-03 ENCOUNTER — Telehealth: Payer: Self-pay

## 2019-03-03 NOTE — Telephone Encounter (Signed)
Returned patients call.  See ICM note

## 2019-03-03 NOTE — Telephone Encounter (Signed)
Remote ICM transmission received.  Attempted call to patient regarding ICM remote transmission and no answer.  

## 2019-03-03 NOTE — Progress Notes (Signed)
Spoke with patient.  He has adjusted his diet and lowered the salt intake.  He is asymptomatic and does not have any fluid symptoms.  Advised to continue to limit salt intake and will recheck fluid levels on 03/15/2018.

## 2019-03-03 NOTE — Telephone Encounter (Signed)
Pt left a voicemail returning your call -- he's home now and shouldn't be going anywhere else.   [phone]

## 2019-03-17 ENCOUNTER — Telehealth: Payer: Self-pay

## 2019-03-17 NOTE — Telephone Encounter (Signed)
Left message for patient to remind of missed remote transmission.  

## 2019-03-31 ENCOUNTER — Telehealth: Payer: Self-pay | Admitting: Cardiology

## 2019-03-31 ENCOUNTER — Ambulatory Visit (INDEPENDENT_AMBULATORY_CARE_PROVIDER_SITE_OTHER): Payer: Medicare Other | Admitting: *Deleted

## 2019-03-31 DIAGNOSIS — I4891 Unspecified atrial fibrillation: Secondary | ICD-10-CM | POA: Diagnosis not present

## 2019-03-31 LAB — CUP PACEART REMOTE DEVICE CHECK
Battery Remaining Longevity: 127 mo
Battery Voltage: 3.06 V
Brady Statistic AP VP Percent: 0 %
Brady Statistic AP VS Percent: 0 %
Brady Statistic AS VP Percent: 9 %
Brady Statistic AS VS Percent: 91 %
Brady Statistic RA Percent Paced: 0 %
Brady Statistic RV Percent Paced: 11.08 %
Date Time Interrogation Session: 20210202022702
HighPow Impedance: 68 Ohm
Implantable Lead Implant Date: 20060116
Implantable Lead Implant Date: 20130715
Implantable Lead Location: 753859
Implantable Lead Location: 753860
Implantable Lead Model: 5076
Implantable Lead Model: 6935
Implantable Pulse Generator Implant Date: 20200804
Lead Channel Impedance Value: 437 Ohm
Lead Channel Impedance Value: 532 Ohm
Lead Channel Impedance Value: 988 Ohm
Lead Channel Pacing Threshold Amplitude: 0.375 V
Lead Channel Pacing Threshold Pulse Width: 0.4 ms
Lead Channel Sensing Intrinsic Amplitude: 1.125 mV
Lead Channel Sensing Intrinsic Amplitude: 31.625 mV
Lead Channel Sensing Intrinsic Amplitude: 31.625 mV
Lead Channel Setting Pacing Amplitude: 2.5 V
Lead Channel Setting Pacing Pulse Width: 0.4 ms
Lead Channel Setting Sensing Sensitivity: 0.3 mV

## 2019-03-31 NOTE — Telephone Encounter (Signed)
Patient is calling stating his PCP has not filled out the time sensitive paperwork regarding his license. He is requesting our office reach out to Dr. Andrey Campanile requesting the paperwork be filled out or have them fax it over to our office to be handled by Dr. Antoine Poche. Patient states if the paperwork is not filled out in a timely manner his licenses will be suspended. His PCP's phone number is [phone]. Please advise.

## 2019-03-31 NOTE — Telephone Encounter (Signed)
Spoke with Dr. Tawana Scale office, they have the license paperwork in hand and if there is a specific portion for cardiology they will send it over for Dr. Antoine Poche. Otherwise, they will have the PCP complete paperwork.

## 2019-03-31 NOTE — Telephone Encounter (Signed)
Please see below, unsure if Dr.Hochrein would even fill out paperwork for his license.  Thank you!

## 2019-04-01 ENCOUNTER — Telehealth: Payer: Self-pay

## 2019-04-01 ENCOUNTER — Ambulatory Visit (INDEPENDENT_AMBULATORY_CARE_PROVIDER_SITE_OTHER): Payer: Medicare Other

## 2019-04-01 ENCOUNTER — Other Ambulatory Visit: Payer: Self-pay | Admitting: Cardiology

## 2019-04-01 DIAGNOSIS — I5022 Chronic systolic (congestive) heart failure: Secondary | ICD-10-CM

## 2019-04-01 DIAGNOSIS — Z95810 Presence of automatic (implantable) cardiac defibrillator: Secondary | ICD-10-CM | POA: Diagnosis not present

## 2019-04-01 NOTE — Progress Notes (Signed)
EPIC Encounter for ICM Monitoring  Patient Name: Douglas [name] is a 64 y.o. male Date: 04/01/2019 Primary Care Physican: [name], MD Primary Cardiologist:Hochrein Electrophysiologist:Taylor 02/23/2019 Weight:240lbs    Attempted call to patient and unable to reach.   Transmission reviewed.     OptivolThoracic impedance improving but suggesting possible fluid accumulation since 03/10/2019.   Prescribed: Torsemide20 mg take 1 tablet daily.  Labs: 09/30/2018 Potassium 4.7 09/26/2018 Creatinine1.31, BUN21, Potassium5.7, Sodium141, [id number] A complete set of results can be found in Results Review  Recommendations: Unable to reach.    Follow-up plan: ICM clinic phone appointment on 04/20/2019 to recheck fluid levels.   91 day device clinic remote transmission 06/30/2019.  Office appt 05/07/2019 with Dr. Antoine Poche.    Copy of ICM check sent to Dr. [name] and Dr Antoine Poche.  3 month ICM trend: 04/01/2019    1 Year ICM trend:       Karie Soda, RN 04/01/2019 1:55 PM

## 2019-04-01 NOTE — Progress Notes (Signed)
ICD Remote  

## 2019-04-01 NOTE — Telephone Encounter (Signed)
Remote ICM transmission received.  Attempted call to patient regarding ICM remote transmission and line busy.  

## 2019-04-20 ENCOUNTER — Ambulatory Visit (INDEPENDENT_AMBULATORY_CARE_PROVIDER_SITE_OTHER): Payer: Medicare Other

## 2019-04-20 ENCOUNTER — Telehealth: Payer: Self-pay

## 2019-04-20 DIAGNOSIS — I5022 Chronic systolic (congestive) heart failure: Secondary | ICD-10-CM

## 2019-04-20 DIAGNOSIS — Z95810 Presence of automatic (implantable) cardiac defibrillator: Secondary | ICD-10-CM

## 2019-04-20 NOTE — Progress Notes (Signed)
EPIC Encounter for ICM Monitoring  Patient Name: Douglas [name] is a 64 y.o. male Date: 04/20/2019 Primary Care Physican: [name], MD Primary Cardiologist:Hochrein Electrophysiologist:Taylor 04/20/2019 Weight:240lbs    Spoke with patient and he is asymptomatic. He admits he has not been adhering to a low salt diet due to other people providing him meals.   He has completed 1st COVID vaccine and waiting to take the 2nd dose.  OptivolThoracic impedancesuggesting possible fluid accumulation since 03/30/2019.   Prescribed: Torsemide20 mg take 1 tablet daily.  Labs: 09/30/2018 Potassium 4.7 09/26/2018 Creatinine1.31, BUN21, Potassium5.7, Sodium141, [id number] A complete set of results can be found in Results Review  Recommendations: Recommendation to limit salt intake to 2000 mg daily and fluid intake to 64 oz daily.  Encouraged to call if experiencing any fluid symptoms.   Follow-up plan: ICM clinic phone appointment on3/02/2019 (manual send) to recheck fluid levels. 91 day device clinic remote transmission 06/30/2019. Office appt 05/07/2019 with Dr.Hochrein.   Copy of ICM check sent to Dr.Taylor andDr Hochrein.  3 month ICM trend: 04/20/2019    1 Year ICM trend:       Karie Soda, RN 04/20/2019 2:55 PM

## 2019-04-20 NOTE — Telephone Encounter (Signed)
Yes Dr Antoine Poche, I have scheduled a recheck for 04/27/2019 and advised him to work on limiting salt intake.

## 2019-04-20 NOTE — Progress Notes (Signed)
Advised Dr Antoine Poche will recheck fluid levels 3/1

## 2019-04-20 NOTE — Progress Notes (Signed)
[name], MD  Physician  Specialty:  Cardiology  Telephone Encounter  Signed  Creation Time:  04/20/2019 4:50 PM          Signed        If he has no symptoms and can really work on the salt I might not change meds.  However, could we recheck Optivol in a week or 10 days?

## 2019-04-20 NOTE — Telephone Encounter (Signed)
If he has no symptoms and can really work on the salt I might not change meds.  However, could we recheck Optivol in a week or 10 days?

## 2019-04-20 NOTE — Telephone Encounter (Signed)
Spoke with patient and he is asymptomatic. He admits he has not been adhering to a low salt diet due to other people providing him meals.      OptivolThoracic impedancedecreased suggesting possible fluid accumulation since 03/30/2019.   Prescribed: Torsemide20 mg take 1 tablet daily.  Confirmed patient has been taking this dosage at today's phone call.   Labs: 09/26/2018 Creatinine1.31Lenn Sink, Potassium5.7, [id number], [id number]  Follow-up plan: ICM clinic phone appointment on3/02/2019 (manual send) to recheck fluid levels.   Office appt 05/07/2019 with Dr.Hochrein.   Recommendations:Limit salt intake to 2000 mg daily and fluid intake to 64 oz daily.    Advised patient will send to Dr Antoine Poche for review and if recommendations will call back.    3 month ICM trend: 04/20/2019

## 2019-04-28 ENCOUNTER — Telehealth: Payer: Self-pay

## 2019-04-28 NOTE — Telephone Encounter (Signed)
Left message for patient to remind of missed remote transmission.  

## 2019-05-05 NOTE — Progress Notes (Signed)
No ICM remote transmission received for 04/27/2019 and next ICM transmission scheduled for 05/12/2019.   

## 2019-05-06 ENCOUNTER — Telehealth: Payer: Self-pay | Admitting: Cardiology

## 2019-05-06 NOTE — Telephone Encounter (Signed)
New Message:   Pt says he have an appt Friday morning with Dr Antoine Poche. He says he would like to have lab work when he is here. He like to know if he needs to hold any of his medicine for lab work please?

## 2019-05-06 NOTE — Telephone Encounter (Signed)
Spoke with pt and instructed pt to take am meds with enough water to get them down and to skip breakfast(fasting) bloodwork Md may want labs . Pt verbalizes understanding .Zack Seal

## 2019-05-07 ENCOUNTER — Ambulatory Visit: Payer: Medicare Other | Admitting: Cardiology

## 2019-05-07 NOTE — Progress Notes (Signed)
Cardiology Office Note   Date:  05/10/2019   ID:  Douglas [name], DOB [birth year], MRN [id number]  PCP:  [name], MD  Cardiologist:   [name], MD   Chief Complaint  Patient presents with  . Cardiomyopathy      History of Present Illness: Douglas [name] is a 64 y.o. male who presents for followup of his cardiomyopathy and atrial fibrillation  with a rapid ventricular rate. He was started on Xarelto in the past.  However, he did not want to remain on anticoagulation. He also didn't feel well on Cardizem so we stopped this. He had his pacemaker replaced for ERI.    He comes back for follow-up of his severely reduced ejection fraction.  He lost his right in 2019 after syncope and a fracture.  He gets around with a patch.  He was walking his dog Douglas [name].  He is tearful about this.  However, he has done well from a cardiovascular standpoint.  He denies any palpitations, presyncope or syncope.  He has had no chest pressure, neck or arm discomfort.  Has had no new shortness of breath, PND or orthopnea.  He does not notice his atrial fibrillation.  He tolerates his anticoagulation.   Past Medical History:  Diagnosis Date  . A-fib (HCC)   . AICD (automatic cardioverter/defibrillator) present   . Anemia   . Asthma   . CAD (coronary artery disease)   . CHF (congestive heart failure) (HCC)   . Dyslipidemia   . Elevated cholesterol   . Hypertension     Past Surgical History:  Procedure Laterality Date  . CARDIAC CATHETERIZATION  1996   stent placement  . CARDIAC DEFIBRILLATOR PLACEMENT    . CORONARY ARTERY BYPASS GRAFT  2002   times 4  . CORONARY ARTERY BYPASS GRAFT    . EYE SURGERY    . ICD GENERATOR CHANGEOUT N/A 09/30/2018   Procedure: ICD GENERATOR CHANGEOUT;  Surgeon: Marinus Maw, MD;  Location: [facility] INVASIVE CV LAB;  Service: Cardiovascular;  Laterality: N/A;  . IMPLANTABLE CARDIOVERTER DEFIBRILLATOR (ICD) GENERATOR CHANGE N/A 09/10/2011   Procedure:  ICD GENERATOR CHANGE;  Surgeon: Marinus Maw, MD;  Location: [facility] CATH LAB;  Service: Cardiovascular;  Laterality: N/A;  . INSERT / REPLACE / REMOVE PACEMAKER    . LEFT HEART CATHETERIZATION WITH CORONARY ANGIOGRAM N/A 08/29/2011   Procedure: LEFT HEART CATHETERIZATION WITH CORONARY ANGIOGRAM;  Surgeon: Peter M Swaziland, MD;  Location: [facility] CATH LAB;  Service: Cardiovascular;  Laterality: N/A;     Current Outpatient Medications  Medication Sig Dispense Refill  . albuterol (PROVENTIL HFA;VENTOLIN HFA) 108 (90 Base) MCG/ACT inhaler Inhale 2 puffs into the lungs every 6 (six) hours as needed for wheezing or shortness of breath.     . alprazolam (XANAX) 2 MG tablet Take 1-2 mg by mouth 4 (four) times daily as needed for anxiety.     Marland Kitchen aspirin EC 81 MG tablet Take 81 mg by mouth daily.    Marland Kitchen atorvastatin (LIPITOR) 80 MG tablet Take 1 tablet (80 mg total) by mouth daily. (Patient taking differently: Take 80 mg by mouth every evening. ) 90 tablet 3  . Carboxymethylcellulose Sodium (ARTIFICIAL TEARS OP) Place 1 drop into the right eye 3 (three) times daily.    . citalopram (CELEXA) 20 MG tablet Take 20 mg by mouth daily.    Marland Kitchen ELIQUIS 5 MG TABS tablet TAKE ONE TABLET BY MOUTH TWICE DAILY (Patient taking differently: Take  5 mg by mouth 2 (two) times daily. ) 120 tablet 0  . Emollient (GOLD BOND MEDICATED BODY) 5-0.5 % LOTN Apply 1 application topically at bedtime. Apply to left wrist    . fluticasone (FLONASE) 50 MCG/ACT nasal spray Place 1 spray into both nostrils daily.    . Fluticasone-Salmeterol (ADVAIR) 250-50 MCG/DOSE AEPB Inhale 1 puff into the lungs 2 (two) times daily.    . lansoprazole (PREVACID) 30 MG capsule Take 30 mg by mouth daily.      Marland Kitchen lisinopril (ZESTRIL) 2.5 MG tablet TAKE ONE (1) TABLET EACH DAY 90 tablet 3  . loratadine (CLARITIN) 10 MG tablet Take 10 mg by mouth at bedtime.    . meloxicam (MOBIC) 15 MG tablet Take 15 mg by mouth daily.    . metoprolol succinate (TOPROL-XL) 100 MG 24 hr  tablet TAKE ONE (1) TABLET EACH DAY 90 tablet 3  . Multiple Vitamin (MULTIVITAMIN WITH MINERALS) TABS Take 1 tablet by mouth daily.    . Omega-3 Fatty Acids (FISH OIL) 1200 MG CAPS Take 1,200 mg by mouth daily.    Marland Kitchen oxyCODONE-acetaminophen (PERCOCET) 10-325 MG per tablet Take 1 tablet by mouth every 6 (six) hours as needed for pain.     . polyethylene glycol (MIRALAX / GLYCOLAX) packet Take 17 g by mouth daily as needed for mild constipation. 14 each 0  . potassium chloride SA (K-DUR,KLOR-CON) 20 MEQ tablet Take 1 tablet (20 mEq total) by mouth daily. 90 tablet 3  . promethazine (PHENERGAN) 25 MG tablet Take 12.5-25 mg by mouth every 8 (eight) hours as needed for nausea or vomiting.     Marland Kitchen spironolactone (ALDACTONE) 25 MG tablet Take 1 tablet (25 mg total) by mouth daily. 90 tablet 0  . tamsulosin (FLOMAX) 0.4 MG CAPS capsule Take 1 capsule (0.4 mg total) by mouth daily. 30 capsule 6  . tiZANidine (ZANAFLEX) 4 MG tablet Take 4 mg by mouth 2 (two) times daily.     Marland Kitchen torsemide (DEMADEX) 20 MG tablet TAKE ONE (1) TABLET EACH DAY 90 tablet 3   No current facility-administered medications for this visit.    Allergies:   Codeine, Xarelto [rivaroxaban], and Vioxx [rofecoxib]    ROS:  Please see the history of present illness.   Otherwise, review of systems are positive for none.   All other systems are reviewed and negative.    PHYSICAL EXAM: VS:  BP (!) 142/86   Pulse 73   Ht 5\' 9"  (1.753 m)   BMI 35.44 kg/m  , BMI Body mass index is 35.44 kg/m. GENERAL:  Well appearing NECK:  No jugular venous distention, waveform within normal limits, carotid upstroke brisk and symmetric, no bruits, no thyromegaly LUNGS:  Clear to auscultation bilaterally CHEST:  Well healed sternotomy scar.  Right ICD pocket  HEART:  PMI not displaced or sustained,S1 and S2 within normal limits, no S3, no clicks, no rubs, no murmurs, irregular ABD:  Flat, positive bowel sounds normal in frequency in pitch, no bruits, no  rebound, no guarding, no midline pulsatile mass, no hepatomegaly, no splenomegaly EXT:  2 plus pulses throughout, no edema, no cyanosis no clubbing   EKG:  EKG is ordered today. The ekg ordered today demonstrates atrial fibrillation, rate 73, axis within normal limits, intervals within normal limits, no acute ST-T wave changes.   Recent Labs: 05/08/2019: BUN 33; Creatinine, Ser 1.43; Hemoglobin 15.1; Platelets 283; Potassium 4.6; Sodium 136    Lipid Panel    Component Value Date/Time  CHOL 170 05/08/2019 0935   TRIG 124 05/08/2019 0935   HDL 49 05/08/2019 0935   CHOLHDL 3.5 05/08/2019 0935   CHOLHDL 4.2 11/04/2013 1654   VLDL 22 11/04/2013 1654   LDLCALC 99 05/08/2019 0935      Wt Readings from Last 3 Encounters:  01/13/19 240 lb (108.9 kg)  10/30/18 252 lb (114.3 kg)  09/30/18 243 lb (110.2 kg)      Other studies Reviewed: Additional studies/ records that were reviewed today include: ICD interrogation.. Review of the above records demonstrates:  Please see elsewhere in the note.     ASSESSMENT AND PLAN:  CARDIOMYOPATHY:    His last ICD interrogation suggested that his volume might be a little bit up but he does not seem that way clinically.  I think he is doing well with his salt and fluid restriction.  He certainly has not tolerated med titration in the past because of labile blood pressures and recurrent syncope.  Therefore, I do not think we can try to push his minimal therapy.   ATRIAL FIB:   Douglas [name] has a CHA2DS2 - VASc score of 3.    Tolerates anticoagulation and rate control.  No change in therapy.   CAD:   Has had no chest discomfort.  He will continue with risk reduction.   DYSLIPIDEMIA:    His last LDL was not quite at target last time.  I am repeating a lipid profile today.  HTN:  Blood pressure is actually slightly elevated today but this is quite unusual.  No change in therapy.   OBESITY:  He is gained about 10 pounds since max diet.  We  talked about this and hopefully he will get a new dog and continue weight loss.     Current medicines are reviewed at length with the patient today.  The patient does not have concerns regarding medicines.  The following changes have been made:  no change  Labs/ tests ordered today include:   Orders Placed This Encounter  Procedures  . Lipid panel  . Basic metabolic panel  . CBC  . EKG 12-Lead     Disposition:   FU with me in six months.     Signed, [name], MD  05/10/2019 12:53 PM    [facility]

## 2019-05-08 ENCOUNTER — Encounter: Payer: Self-pay | Admitting: Cardiology

## 2019-05-08 ENCOUNTER — Ambulatory Visit (INDEPENDENT_AMBULATORY_CARE_PROVIDER_SITE_OTHER): Payer: Medicare Other | Admitting: Cardiology

## 2019-05-08 ENCOUNTER — Other Ambulatory Visit: Payer: Self-pay

## 2019-05-08 VITALS — BP 142/86 | HR 73 | Ht 69.0 in

## 2019-05-08 DIAGNOSIS — I482 Chronic atrial fibrillation, unspecified: Secondary | ICD-10-CM

## 2019-05-08 DIAGNOSIS — I255 Ischemic cardiomyopathy: Secondary | ICD-10-CM

## 2019-05-08 DIAGNOSIS — E785 Hyperlipidemia, unspecified: Secondary | ICD-10-CM

## 2019-05-08 DIAGNOSIS — I10 Essential (primary) hypertension: Secondary | ICD-10-CM | POA: Diagnosis not present

## 2019-05-08 DIAGNOSIS — Z7189 Other specified counseling: Secondary | ICD-10-CM

## 2019-05-08 LAB — CBC
Hematocrit: 43.6 % (ref 37.5–51.0)
Hemoglobin: 15.1 g/dL (ref 13.0–17.7)
MCH: 32.4 pg (ref 26.6–33.0)
MCHC: 34.6 g/dL (ref 31.5–35.7)
MCV: 94 fL (ref 79–97)
Platelets: 283 10*3/uL (ref 150–450)
RBC: 4.66 x10E6/uL (ref 4.14–5.80)
RDW: 13.4 % (ref 11.6–15.4)
WBC: 10.4 10*3/uL (ref 3.4–10.8)

## 2019-05-08 LAB — LIPID PANEL
Chol/HDL Ratio: 3.5 ratio (ref 0.0–5.0)
Cholesterol, Total: 170 mg/dL (ref 100–199)
HDL: 49 mg/dL (ref >39)
LDL Chol Calc (NIH): 99 mg/dL (ref 0–99)
Triglycerides: 124 mg/dL (ref 0–149)
VLDL Cholesterol Cal: 22 mg/dL (ref 5–40)

## 2019-05-08 LAB — BASIC METABOLIC PANEL
BUN/Creatinine Ratio: 23 (ref 10–24)
BUN: 33 mg/dL — ABNORMAL HIGH (ref 8–27)
CO2: 23 mmol/L (ref 20–29)
Calcium: 10 mg/dL (ref 8.6–10.2)
Chloride: 95 mmol/L — ABNORMAL LOW (ref 96–106)
Creatinine, Ser: 1.43 mg/dL — ABNORMAL HIGH (ref 0.76–1.27)
GFR calc Af Amer: 60 mL/min/{1.73_m2} (ref >59)
GFR calc non Af Amer: 52 mL/min/{1.73_m2} — ABNORMAL LOW (ref >59)
Glucose: 98 mg/dL (ref 65–99)
Potassium: 4.6 mmol/L (ref 3.5–5.2)
Sodium: 136 mmol/L (ref 134–144)

## 2019-05-08 NOTE — Patient Instructions (Signed)
Medication Instructions:  No changes *If you need a refill on your cardiac medications before your next appointment, please call your pharmacy*  Lab Work: Your physician recommends that you return for lab work today (BMP, CBC, Lipids) If you have labs (blood work) drawn today and your tests are completely normal, you will receive your results only by: Marland Kitchen MyChart Message (if you have MyChart) OR . A paper copy in the mail If you have any lab test that is abnormal or we need to change your treatment, we will call you to review the results.  Testing/Procedures: None needed this visit  Follow-Up: At [facility], you and your health needs are our priority.  As part of our continuing mission to provide you with exceptional heart care, we have created designated Provider Care Teams.  These Care Teams include your primary Cardiologist (physician) and Advanced Practice Providers (APPs -  Physician Assistants and Nurse Practitioners) who all work together to provide you with the care you need, when you need it.  We recommend signing up for the patient portal called "MyChart".  Sign up information is provided on this After Visit Summary.  MyChart is used to connect with patients for Virtual Visits (Telemedicine).  Patients are able to view lab/test results, encounter notes, upcoming appointments, etc.  Non-urgent messages can be sent to your provider as well.   To learn more about what you can do with MyChart, go to [email or web address].    Your next appointment:   6 month(s)  The format for your next appointment:   In Person  Provider:   Azalee Course, PA

## 2019-05-10 ENCOUNTER — Encounter: Payer: Self-pay | Admitting: Cardiology

## 2019-05-12 ENCOUNTER — Ambulatory Visit (INDEPENDENT_AMBULATORY_CARE_PROVIDER_SITE_OTHER): Payer: Medicare Other

## 2019-05-12 ENCOUNTER — Other Ambulatory Visit: Payer: Self-pay

## 2019-05-12 DIAGNOSIS — Z95810 Presence of automatic (implantable) cardiac defibrillator: Secondary | ICD-10-CM | POA: Diagnosis not present

## 2019-05-12 DIAGNOSIS — I5022 Chronic systolic (congestive) heart failure: Secondary | ICD-10-CM

## 2019-05-12 MED ORDER — ROSUVASTATIN CALCIUM 40 MG PO TABS: 40.0000 mg | ORAL_TABLET | Freq: Every day | ORAL | 3 refills | Status: DC

## 2019-05-12 NOTE — Progress Notes (Signed)
Per Dr. Antoine Poche: He is not quite at target with his LDL. Stop Lipitor and begin Crestor 40 mg po daily.

## 2019-05-13 NOTE — Progress Notes (Signed)
EPIC Encounter for ICM Monitoring  Patient Name: Douglas [name] is a 64 y.o. male Date: 05/13/2019 Primary Care Physican: [name], MD Primary Cardiologist:Hochrein Electrophysiologist:Taylor 04/20/2019 Weight:240lbs    Transmission reviewed.  Per Dr [name] 05/08/2019 office notes:  Patient has not tolerated med titration in past because of labile blood pressures and recurrent syncope and for that reason do think think can push his minimal therapy.  OptivolThoracic impedanceclose to normal baseline.   Prescribed: Torsemide20 mg take 1 tablet daily.  Labs: 09/30/2018 Potassium 4.7 09/26/2018 Creatinine1.31, BUN21, Potassium5.7, Sodium141, [id number] A complete set of results can be found in Results Review  Recommendations: None  Follow-up plan: ICM clinic phone appointment on4/19/2021. 91 day device clinic remote transmission5/05/2019.   Copy of ICM check sent to Dr.Taylor.  3 month ICM trend: 05/11/2019    1 Year ICM trend:       Karie Soda, RN 05/13/2019 3:11 PM

## 2019-06-09 DIAGNOSIS — N1831 Chronic kidney disease, stage 3a: Secondary | ICD-10-CM | POA: Insufficient documentation

## 2019-06-15 ENCOUNTER — Ambulatory Visit (INDEPENDENT_AMBULATORY_CARE_PROVIDER_SITE_OTHER): Payer: Medicare Other

## 2019-06-15 DIAGNOSIS — I5022 Chronic systolic (congestive) heart failure: Secondary | ICD-10-CM | POA: Diagnosis not present

## 2019-06-15 DIAGNOSIS — Z95810 Presence of automatic (implantable) cardiac defibrillator: Secondary | ICD-10-CM | POA: Diagnosis not present

## 2019-06-16 NOTE — Progress Notes (Signed)
EPIC Encounter for ICM Monitoring  Patient Name: Douglas [name] is a 64 y.o. male Date: 06/16/2019 Primary Care Physican: [name], MD Primary Cardiologist:Hochrein Electrophysiologist:Taylor 4/20/2021Weight:240lbs    Spoke with patient and reports feeling well at this time.  Denies fluid symptoms.    OptivolThoracic impedancetrending along baseline normal.   Prescribed: Torsemide20 mg take 1 tablet daily.  Labs: 09/30/2018 Potassium 4.7 09/26/2018 Creatinine1.31, BUN21, Potassium5.7, Sodium141, [id number] A complete set of results can be found in Results Review  Recommendations:No changes and encouraged to call if experiencing any fluid symptoms.  Follow-up plan: ICM clinic phone appointment on5/24/2021. 91 day device clinic remote transmission5/05/2019.   Copy of ICM check sent to Dr.Taylor.  3 month ICM trend: 06/15/2019    1 Year ICM trend:       Karie Soda, RN 06/16/2019 10:39 AM

## 2019-06-23 ENCOUNTER — Other Ambulatory Visit: Payer: Self-pay | Admitting: Cardiology

## 2019-06-30 ENCOUNTER — Ambulatory Visit (INDEPENDENT_AMBULATORY_CARE_PROVIDER_SITE_OTHER): Payer: Medicare Other | Admitting: *Deleted

## 2019-06-30 DIAGNOSIS — I255 Ischemic cardiomyopathy: Secondary | ICD-10-CM | POA: Diagnosis not present

## 2019-06-30 LAB — CUP PACEART REMOTE DEVICE CHECK
Battery Remaining Longevity: 125 mo
Battery Voltage: 3.04 V
Brady Statistic AP VP Percent: 0 %
Brady Statistic AP VS Percent: 0 %
Brady Statistic AS VP Percent: 5.05 %
Brady Statistic AS VS Percent: 94.95 %
Brady Statistic RA Percent Paced: 0 %
Brady Statistic RV Percent Paced: 8.33 %
Date Time Interrogation Session: 20210504052705
HighPow Impedance: 85 Ohm
Implantable Lead Implant Date: 20060116
Implantable Lead Implant Date: 20130715
Implantable Lead Location: 753859
Implantable Lead Location: 753860
Implantable Lead Model: 5076
Implantable Lead Model: 6935
Implantable Pulse Generator Implant Date: 20200804
Lead Channel Impedance Value: 494 Ohm
Lead Channel Impedance Value: 589 Ohm
Lead Channel Impedance Value: 988 Ohm
Lead Channel Pacing Threshold Amplitude: 0.375 V
Lead Channel Pacing Threshold Pulse Width: 0.4 ms
Lead Channel Sensing Intrinsic Amplitude: 1.125 mV
Lead Channel Sensing Intrinsic Amplitude: 31.625 mV
Lead Channel Sensing Intrinsic Amplitude: 31.625 mV
Lead Channel Setting Pacing Amplitude: 2.5 V
Lead Channel Setting Pacing Pulse Width: 0.4 ms
Lead Channel Setting Sensing Sensitivity: 0.3 mV

## 2019-07-01 NOTE — Progress Notes (Signed)
Remote ICD transmission.   

## 2019-07-20 ENCOUNTER — Ambulatory Visit (INDEPENDENT_AMBULATORY_CARE_PROVIDER_SITE_OTHER): Payer: Medicare Other

## 2019-07-20 DIAGNOSIS — I5022 Chronic systolic (congestive) heart failure: Secondary | ICD-10-CM | POA: Diagnosis not present

## 2019-07-20 DIAGNOSIS — Z95810 Presence of automatic (implantable) cardiac defibrillator: Secondary | ICD-10-CM

## 2019-07-21 NOTE — Progress Notes (Signed)
EPIC Encounter for ICM Monitoring  Patient Name: Douglas [name] is a 64 y.o. male Date: 07/21/2019 Primary Care Physican: [name], MD Primary Cardiologist:Hochrein Electrophysiologist:Taylor 4/20/2021Weight:240lbs    Spoke with patient and reports feeling well at this time.  Denies fluid symptoms.    OptivolThoracic impedancenormal.   Prescribed: Torsemide20 mg take 1 tablet daily.  Labs: 05/08/2019 Creatinine 1.43, BUN 33, Potassium 4.6, Sodium 136, GFR 52-60 A complete set of results can be found in Results Review  Recommendations:No changes and encouraged to call if experiencing any fluid symptoms.  Follow-up plan: ICM clinic phone appointment on 08/24/2019. 91 day device clinic remote transmission8/04/2019.   Copy of ICM check sent to Dr.Taylor.  3 month ICM trend: 07/20/2019    1 Year ICM trend:       Karie Soda, RN 07/21/2019 4:37 PM

## 2019-08-24 ENCOUNTER — Ambulatory Visit (INDEPENDENT_AMBULATORY_CARE_PROVIDER_SITE_OTHER): Payer: Medicare Other

## 2019-08-24 DIAGNOSIS — I5022 Chronic systolic (congestive) heart failure: Secondary | ICD-10-CM

## 2019-08-24 DIAGNOSIS — Z95810 Presence of automatic (implantable) cardiac defibrillator: Secondary | ICD-10-CM

## 2019-08-26 NOTE — Progress Notes (Signed)
EPIC Encounter for ICM Monitoring  Patient Name: Douglas [name] is a 64 y.o. male Date: 08/26/2019 Primary Care Physican: [name], MD Primary Cardiologist:Hochrein Electrophysiologist:Taylor 4/20/2021Weight:240lbs    Transmission reviewed.  OptivolThoracic impedancenormal.   Prescribed: Torsemide20 mg take 1 tablet daily.  Labs: 05/08/2019 Creatinine 1.43, BUN 33, Potassium 4.6, Sodium 136, GFR 52-60 A complete set of results can be found in Results Review  Recommendations:None.  Follow-up plan: ICM clinic phone appointment on 09/30/2019. 91 day device clinic remote transmission8/04/2019.   EP/Cardiology Office Visits: 10/05/2019 with Azalee Course PA.    Copy of ICM check sent to Dr. [name].   3 month ICM trend: 08/24/2019    1 Year ICM trend:       Karie Soda, RN 08/26/2019 10:24 AM

## 2019-09-08 DIAGNOSIS — Z23 Encounter for immunization: Secondary | ICD-10-CM | POA: Insufficient documentation

## 2019-09-19 DIAGNOSIS — F1120 Opioid dependence, uncomplicated: Secondary | ICD-10-CM | POA: Insufficient documentation

## 2019-09-20 ENCOUNTER — Other Ambulatory Visit: Payer: Self-pay | Admitting: Cardiology

## 2019-09-29 ENCOUNTER — Ambulatory Visit (INDEPENDENT_AMBULATORY_CARE_PROVIDER_SITE_OTHER): Payer: Medicare Other | Admitting: *Deleted

## 2019-09-29 DIAGNOSIS — I255 Ischemic cardiomyopathy: Secondary | ICD-10-CM | POA: Diagnosis not present

## 2019-09-29 LAB — CUP PACEART REMOTE DEVICE CHECK
Battery Remaining Longevity: 122 mo
Battery Voltage: 3.03 V
Brady Statistic AP VP Percent: 0 %
Brady Statistic AP VS Percent: 0 %
Brady Statistic AS VP Percent: 12.8 %
Brady Statistic AS VS Percent: 87.2 %
Brady Statistic RA Percent Paced: 0 %
Brady Statistic RV Percent Paced: 12.33 %
Date Time Interrogation Session: 20210803202705
HighPow Impedance: 73 Ohm
Implantable Lead Implant Date: 20060116
Implantable Lead Implant Date: 20130715
Implantable Lead Location: 753859
Implantable Lead Location: 753860
Implantable Lead Model: 5076
Implantable Lead Model: 6935
Implantable Pulse Generator Implant Date: 20200804
Lead Channel Impedance Value: 494 Ohm
Lead Channel Impedance Value: 570 Ohm
Lead Channel Impedance Value: 988 Ohm
Lead Channel Pacing Threshold Amplitude: 0.375 V
Lead Channel Pacing Threshold Pulse Width: 0.4 ms
Lead Channel Sensing Intrinsic Amplitude: 31.625 mV
Lead Channel Sensing Intrinsic Amplitude: 31.625 mV
Lead Channel Sensing Intrinsic Amplitude: 5.25 mV
Lead Channel Sensing Intrinsic Amplitude: 5.25 mV
Lead Channel Setting Pacing Amplitude: 2.5 V
Lead Channel Setting Pacing Pulse Width: 0.4 ms
Lead Channel Setting Sensing Sensitivity: 0.3 mV

## 2019-09-30 ENCOUNTER — Ambulatory Visit (INDEPENDENT_AMBULATORY_CARE_PROVIDER_SITE_OTHER): Payer: Medicare Other

## 2019-09-30 DIAGNOSIS — I5022 Chronic systolic (congestive) heart failure: Secondary | ICD-10-CM

## 2019-09-30 DIAGNOSIS — Z95810 Presence of automatic (implantable) cardiac defibrillator: Secondary | ICD-10-CM | POA: Diagnosis not present

## 2019-10-01 NOTE — Progress Notes (Signed)
Remote ICD transmission.   

## 2019-10-05 ENCOUNTER — Other Ambulatory Visit: Payer: Self-pay

## 2019-10-05 ENCOUNTER — Ambulatory Visit: Payer: Medicare Other | Admitting: Physician Assistant

## 2019-10-05 ENCOUNTER — Telehealth: Payer: Self-pay

## 2019-10-05 NOTE — Telephone Encounter (Signed)
Pt came in and we rescheduled to Dr Antoine Poche (pt preference) 11-30-2019 @1040am 

## 2019-10-05 NOTE — Progress Notes (Signed)
EPIC Encounter for ICM Monitoring  Patient Name: Douglas [name] is a 64 y.o. male Date: 10/05/2019 Primary Care Physican: [name], MD Primary Cardiologist:Hochrein Electrophysiologist:Taylor 7/13/2021Weight:251lbs    Transmission reviewed.  OptivolThoracic impedancenormal.   Prescribed: Torsemide20 mg take 1 tablet daily.  Labs: 05/08/2019 Creatinine 1.43, BUN 33, Potassium 4.6, Sodium 136, GFR 52-60 A complete set of results can be found in Results Review  Recommendations:None.  Follow-up plan: ICM clinic phone appointment on9/13/2021. 91 day device clinic remote transmission11/03/2019.   EP/Cardiology Office Visits: 11/30/2019 with Dr Antoine Poche.    Copy of ICM check sent to Dr. [name]  3 month ICM trend: 09/29/2019    1 Year ICM trend:       Karie Soda, RN 10/05/2019 3:58 PM

## 2019-10-15 ENCOUNTER — Other Ambulatory Visit: Payer: Self-pay | Admitting: Internal Medicine

## 2019-10-15 ENCOUNTER — Other Ambulatory Visit: Payer: Self-pay | Admitting: Cardiology

## 2019-11-09 ENCOUNTER — Ambulatory Visit (INDEPENDENT_AMBULATORY_CARE_PROVIDER_SITE_OTHER): Payer: Medicare Other

## 2019-11-09 DIAGNOSIS — Z95810 Presence of automatic (implantable) cardiac defibrillator: Secondary | ICD-10-CM | POA: Diagnosis not present

## 2019-11-09 DIAGNOSIS — I5022 Chronic systolic (congestive) heart failure: Secondary | ICD-10-CM | POA: Diagnosis not present

## 2019-11-11 NOTE — Progress Notes (Signed)
EPIC Encounter for ICM Monitoring  Patient Name: Douglas [name] is a 64 y.o. male Date: 11/11/2019 Primary Care Physican: [name], MD Primary Cardiologist:Hochrein Electrophysiologist:Taylor 7/13/2021Weight:251lbs    Transmission reviewed.  OptivolThoracic impedancenormal but was suggesting possible fluid accumulation from 8/9-8/15 and 8/27-9/6.   Prescribed: Torsemide20 mg take 1 tablet daily.  Labs: 05/08/2019 Creatinine 1.43, BUN 33, Potassium 4.6, Sodium 136, GFR 52-60 A complete set of results can be found in Results Review  Recommendations:None.  Follow-up plan: ICM clinic phone appointment on10/18/2021. 91 day device clinic remote transmission11/03/2019.  EP/Cardiology Office Visits:11/30/2019 withDr Antoine Poche. 01/15/2020 with Dr [name]  Copy of ICM check sent to Dr.Taylor.   3 month ICM trend: 11/09/2019    1 Year ICM trend:       Karie Soda, RN 11/11/2019 1:10 PM

## 2019-11-16 ENCOUNTER — Telehealth: Payer: Self-pay | Admitting: Cardiology

## 2019-11-16 NOTE — Telephone Encounter (Signed)
    Pt would like to speak with Dr. [name] nurse, he asked about a "magnet" that he saw EMS use when his defibrillator goes off, he also wants and discuss tips on how he can control his temper.

## 2019-11-16 NOTE — Telephone Encounter (Signed)
The pt got some error codes on his monitor when he tried to send a transmission. He do not know what to do with his magnet? I let the pt know that there is no reason for him to walk around with the heavy magnet. I told him he can put it away in a drawer. I gave him the number to [facility] tech support to get additional help. He thanked me for my help and stated that I made his day for letting him talk for 35 minutes.
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
## 2019-11-18 NOTE — Telephone Encounter (Signed)
Noted.GT 

## 2019-11-23 NOTE — Telephone Encounter (Signed)
Fu Message   Pt called in and stated he does not like the monitor that he has.  She stated that he can not read the new monitor and all the lights on it keeps him up at night.  He stated he never had one like this .  Pt stated when the batteries was changed the last time he monitor has been different.     Best number - [phone]

## 2019-11-23 NOTE — Telephone Encounter (Signed)
I tried to help the pt with the lights of the monitor. The guy only wanted to talk about how much he do not like Dr. [name]. I said sir I just need to help you with the monitor. He told me to shut up and listen to him. I listen to him for 10 minutes then he got mad because I listen and not go back and forth with him. The pt then called me stupid. I told him conversation is over and have a nice day.
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
## 2019-11-24 NOTE — Telephone Encounter (Signed)
Called the patient's PCP office to schedule earlier appointment than late October.  Spoke with [facility] at Dr. Audrie Gallus office who knows the patient well. She states he has an appointment tomorrow to discuss foot symptoms and they will talk about his sleep issues as well.  Called to confirm appointment with patient, but phone number on file would not go through.   Called DPR, who states she would not take a message but would give the correct phone number to call. She then hung up and would not answer when called back. Finally got through to [facility]. She gave number: [phone] to call. Attempted to call, VM picked up with the patient's name.  Instructed him to keep appointment with PCP tomorrow and to call back with cardiac questions or concerns.

## 2019-11-24 NOTE — Telephone Encounter (Signed)
Patient is calling again stating he tried taking the melatonin he said it didn't work for him.  He states he feels worse than before.  He wants to know what else can be done for him.  He states he needs to get some sleep.

## 2019-11-25 ENCOUNTER — Telehealth: Payer: Self-pay

## 2019-11-25 NOTE — Telephone Encounter (Signed)
Recall for GT in [place] cancelled per Pt request

## 2019-11-25 NOTE — Telephone Encounter (Signed)
Patient called and wants all his appts cx and he has found care elsewhere. They will take over his device transmissions.

## 2019-11-30 ENCOUNTER — Ambulatory Visit: Payer: Medicare Other | Admitting: Cardiology

## 2019-12-14 ENCOUNTER — Ambulatory Visit: Payer: Medicare Other

## 2019-12-14 DIAGNOSIS — Z95810 Presence of automatic (implantable) cardiac defibrillator: Secondary | ICD-10-CM

## 2019-12-14 DIAGNOSIS — I5022 Chronic systolic (congestive) heart failure: Secondary | ICD-10-CM

## 2019-12-15 NOTE — Progress Notes (Signed)
Patient has transferred to new device clinic and cardiologist per 11/25/2019 phone note.

## 2019-12-16 ENCOUNTER — Telehealth: Payer: Medicare Other | Admitting: Cardiovascular Disease

## 2020-01-12 ENCOUNTER — Other Ambulatory Visit: Payer: Self-pay | Admitting: Cardiology

## 2020-01-15 ENCOUNTER — Encounter: Payer: Medicare Other | Admitting: Internal Medicine

## 2020-02-16 ENCOUNTER — Ambulatory Visit: Payer: Medicare Other | Admitting: Dermatology

## 2020-06-07 ENCOUNTER — Other Ambulatory Visit: Payer: Self-pay | Admitting: Cardiology

## 2020-06-09 DIAGNOSIS — J4540 Moderate persistent asthma, uncomplicated: Secondary | ICD-10-CM | POA: Insufficient documentation

## 2020-06-09 DIAGNOSIS — R739 Hyperglycemia, unspecified: Secondary | ICD-10-CM | POA: Insufficient documentation

## 2020-06-09 DIAGNOSIS — H543 Unqualified visual loss, both eyes: Secondary | ICD-10-CM | POA: Insufficient documentation

## 2020-06-09 DIAGNOSIS — Z95810 Presence of automatic (implantable) cardiac defibrillator: Secondary | ICD-10-CM | POA: Insufficient documentation

## 2020-07-18 ENCOUNTER — Other Ambulatory Visit: Payer: Self-pay | Admitting: Cardiology

## 2020-10-17 ENCOUNTER — Other Ambulatory Visit: Payer: Self-pay | Admitting: Cardiology

## 2020-12-23 ENCOUNTER — Telehealth: Payer: Self-pay

## 2020-12-23 NOTE — Telephone Encounter (Signed)
I called the patient to confirm he wanted to be release to [facility] Cardiology. LMOVM for pt to return my call.

## 2020-12-23 NOTE — Telephone Encounter (Signed)
I spoke with the patient and he do want to transfer to [facility] Cardiology.

## 2021-07-10 LAB — CUP PACEART REMOTE DEVICE CHECK
Battery Remaining Longevity: 99 mo
Battery Voltage: 3.01 V
Brady Statistic AP VP Percent: 0 %
Brady Statistic AP VS Percent: 0 %
Brady Statistic AS VP Percent: 9.53 %
Brady Statistic AS VS Percent: 90.47 %
Brady Statistic RA Percent Paced: 0 %
Brady Statistic RV Percent Paced: 13.71 %
Date Time Interrogation Session: 20230515103824
HighPow Impedance: 77 Ohm
Implantable Lead Implant Date: 20060116
Implantable Lead Implant Date: 20130715
Implantable Lead Location: 753859
Implantable Lead Location: 753860
Implantable Lead Model: 5076
Implantable Lead Model: 6935
Implantable Pulse Generator Implant Date: 20200804
Lead Channel Impedance Value: 1292 Ohm
Lead Channel Impedance Value: 456 Ohm
Lead Channel Impedance Value: 532 Ohm
Lead Channel Pacing Threshold Amplitude: 0.5 V
Lead Channel Pacing Threshold Pulse Width: 0.4 ms
Lead Channel Sensing Intrinsic Amplitude: 31.625 mV
Lead Channel Sensing Intrinsic Amplitude: 31.625 mV
Lead Channel Sensing Intrinsic Amplitude: 4.125 mV
Lead Channel Sensing Intrinsic Amplitude: 4.125 mV
Lead Channel Setting Pacing Amplitude: 2 V
Lead Channel Setting Pacing Pulse Width: 0.4 ms
Lead Channel Setting Sensing Sensitivity: 0.3 mV

## 2021-07-11 ENCOUNTER — Telehealth: Payer: Self-pay

## 2021-07-11 ENCOUNTER — Ambulatory Visit: Payer: Medicare Other

## 2021-07-11 NOTE — Telephone Encounter (Signed)
Patient called back. He states he is now with [facility] and does not need to be contacted by the office again.  ?

## 2021-07-11 NOTE — Telephone Encounter (Signed)
Attempted all phone numbers and contacts listed for patient to call and schedule appointment to have ICD checked patient is overdue. LVM at a [facility] cell phone ok per DPR asking to contact Douglas [name] to call device clinic to schedule apt.  ?

## 2021-07-31 LAB — COLOGUARD: Cologuard: NEGATIVE

## 2021-08-07 LAB — COLOGUARD: COLOGUARD: NEGATIVE

## 2021-09-07 ENCOUNTER — Encounter (HOSPITAL_COMMUNITY): Payer: Self-pay | Admitting: *Deleted

## 2021-09-07 ENCOUNTER — Emergency Department (HOSPITAL_COMMUNITY)
Admission: EM | Admit: 2021-09-07 | Discharge: 2021-09-07 | Disposition: A | Discharge status: Home / Self Care | Payer: Medicare Other | Attending: Emergency Medicine | Admitting: Emergency Medicine

## 2021-09-07 ENCOUNTER — Other Ambulatory Visit: Payer: Self-pay

## 2021-09-07 ENCOUNTER — Emergency Department (HOSPITAL_COMMUNITY): Payer: Medicare Other

## 2021-09-07 DIAGNOSIS — R3911 Hesitancy of micturition: Secondary | ICD-10-CM | POA: Diagnosis not present

## 2021-09-07 DIAGNOSIS — Z79899 Other long term (current) drug therapy: Secondary | ICD-10-CM | POA: Insufficient documentation

## 2021-09-07 DIAGNOSIS — R42 Dizziness and giddiness: Secondary | ICD-10-CM | POA: Diagnosis not present

## 2021-09-07 DIAGNOSIS — R110 Nausea: Secondary | ICD-10-CM | POA: Insufficient documentation

## 2021-09-07 DIAGNOSIS — I509 Heart failure, unspecified: Secondary | ICD-10-CM | POA: Insufficient documentation

## 2021-09-07 DIAGNOSIS — Z95810 Presence of automatic (implantable) cardiac defibrillator: Secondary | ICD-10-CM | POA: Insufficient documentation

## 2021-09-07 DIAGNOSIS — Z7901 Long term (current) use of anticoagulants: Secondary | ICD-10-CM | POA: Diagnosis not present

## 2021-09-07 DIAGNOSIS — R350 Frequency of micturition: Secondary | ICD-10-CM | POA: Insufficient documentation

## 2021-09-07 DIAGNOSIS — R300 Dysuria: Secondary | ICD-10-CM | POA: Insufficient documentation

## 2021-09-07 DIAGNOSIS — R0602 Shortness of breath: Secondary | ICD-10-CM | POA: Diagnosis present

## 2021-09-07 DIAGNOSIS — Z7982 Long term (current) use of aspirin: Secondary | ICD-10-CM | POA: Diagnosis not present

## 2021-09-07 HISTORY — DX: Type 2 diabetes mellitus without complications: E11.9

## 2021-09-07 LAB — CBC WITH DIFFERENTIAL/PLATELET
Abs Immature Granulocytes: 0.03 10*3/uL (ref 0.00–0.07)
Basophils Absolute: 0 10*3/uL (ref 0.0–0.1)
Basophils Relative: 0 %
Eosinophils Absolute: 0.1 10*3/uL (ref 0.0–0.5)
Eosinophils Relative: 1 %
HCT: 46.6 % (ref 39.0–52.0)
Hemoglobin: 14.7 g/dL (ref 13.0–17.0)
Immature Granulocytes: 0 %
Lymphocytes Relative: 19 %
Lymphs Abs: 1.6 10*3/uL (ref 0.7–4.0)
MCH: 28.5 pg (ref 26.0–34.0)
MCHC: 31.5 g/dL (ref 30.0–36.0)
MCV: 90.5 fL (ref 80.0–100.0)
Monocytes Absolute: 0.9 10*3/uL (ref 0.1–1.0)
Monocytes Relative: 10 %
Neutro Abs: 5.9 10*3/uL (ref 1.7–7.7)
Neutrophils Relative %: 70 %
Platelets: 268 10*3/uL (ref 150–400)
RBC: 5.15 MIL/uL (ref 4.22–5.81)
RDW: 16.1 % — ABNORMAL HIGH (ref 11.5–15.5)
WBC: 8.5 10*3/uL (ref 4.0–10.5)
nRBC: 0 % (ref 0.0–0.2)

## 2021-09-07 LAB — BASIC METABOLIC PANEL
Anion gap: 8 (ref 5–15)
BUN: 11 mg/dL (ref 8–23)
CO2: 24 mmol/L (ref 22–32)
Calcium: 8.7 mg/dL — ABNORMAL LOW (ref 8.9–10.3)
Chloride: 105 mmol/L (ref 98–111)
Creatinine, Ser: 0.98 mg/dL (ref 0.61–1.24)
GFR, Estimated: >60 mL/min (ref >60)
Glucose, Bld: 94 mg/dL (ref 70–99)
Potassium: 4 mmol/L (ref 3.5–5.1)
Sodium: 137 mmol/L (ref 135–145)

## 2021-09-07 LAB — URINALYSIS, ROUTINE W REFLEX MICROSCOPIC
Bacteria, UA: NONE SEEN
Bilirubin Urine: NEGATIVE
Glucose, UA: >=500 mg/dL — AB
Ketones, ur: 5 mg/dL — AB
Leukocytes,Ua: NEGATIVE
Nitrite: NEGATIVE
Protein, ur: 100 mg/dL — AB
Specific Gravity, Urine: 1.007 (ref 1.005–1.030)
pH: 5 (ref 5.0–8.0)

## 2021-09-07 LAB — TROPONIN I (HIGH SENSITIVITY)
Troponin I (High Sensitivity): 14 ng/L (ref <18)
Troponin I (High Sensitivity): 15 ng/L (ref <18)

## 2021-09-07 LAB — BRAIN NATRIURETIC PEPTIDE: B Natriuretic Peptide: 1043 pg/mL — ABNORMAL HIGH (ref 0.0–100.0)

## 2021-09-07 MED ORDER — FUROSEMIDE 10 MG/ML IJ SOLN
40.0000 mg | Freq: Once | INTRAMUSCULAR | Status: DC
  Filled 2021-09-07: qty 4

## 2021-09-07 MED ORDER — POTASSIUM CHLORIDE CRYS ER 20 MEQ PO TBCR
20.0000 meq | EXTENDED_RELEASE_TABLET | Freq: Once | ORAL | Status: AC
  Administered 2021-09-07: 20 meq via ORAL
  Filled 2021-09-07: qty 1

## 2021-09-07 MED ORDER — TORSEMIDE 20 MG PO TABS
40.0000 mg | ORAL_TABLET | Freq: Every day | ORAL | Status: DC
  Administered 2021-09-07: 40 mg via ORAL
  Filled 2021-09-07: qty 2

## 2021-09-07 NOTE — Discharge Instructions (Signed)
You were seen in the emergency department for some difficulty urinating and some urinary frequency.  He also felt more short of breath.  You had blood work EKG and a chest x-ray along with urinalysis.  There was no signs of urine infection.  Your heart failure blood test was elevated and you received an extra dose of your Demadex.  Please continue your regular medications and follow-up with your primary care doctor and cardiologist.  Return to the emergency department if any worsening or concerning symptoms.

## 2021-09-07 NOTE — ED Triage Notes (Signed)
Pt has multiple complaints  Pt states he feels sob and congested  Pt also c/o buring with urination  Pt c/o feeling lightheaded

## 2021-09-07 NOTE — ED Provider Notes (Signed)
[facility] EMERGENCY DEPARTMENT Provider Note   CSN: [id number] Arrival date & time: 09/07/21  0801     History  Chief Complaint  Patient presents with   Shortness of Breath    Douglas [name] is a 66 y.o. male.  Patient here with multiple complaints.  His main complaint is some urinary hesitancy and frequency dysuria that started yesterday afternoon.  He also has 1 week of increased shortness of breath lightheadedness fatigue and nausea.  Denies any chest pain cough abdominal pain vomiting diarrhea.  No fevers or chills.  He has tried nothing for his symptoms.  The history is provided by the patient.  Shortness of Breath Severity:  Moderate Onset quality:  Gradual Duration:  1 week Timing:  Constant Progression:  Unchanged Chronicity:  Recurrent Relieved by:  None tried Worsened by:  Activity Ineffective treatments:  None tried Associated symptoms: no abdominal pain, no chest pain, no cough, no fever, no hemoptysis and no vomiting   Risk factors: no tobacco use   Urinary Frequency This is a new problem. The current episode started yesterday. The problem occurs hourly. The problem has not changed since onset.Associated symptoms include shortness of breath. Pertinent negatives include no chest pain and no abdominal pain. Nothing aggravates the symptoms. Nothing relieves the symptoms. He has tried rest for the symptoms. The treatment provided no relief.       Home Medications Prior to Admission medications   Medication Sig Start Date End Date Taking? Authorizing Provider  albuterol (PROVENTIL HFA;VENTOLIN HFA) 108 (90 Base) MCG/ACT inhaler Inhale 2 puffs into the lungs every 6 (six) hours as needed for wheezing or shortness of breath.  04/22/17   [provider]  alprazolam Prudy Feeler) 2 MG tablet Take 1-2 mg by mouth 4 (four) times daily as needed for anxiety.     [provider]  aspirin EC 81 MG tablet Take 81 mg by mouth daily.    [provider]   Carboxymethylcellulose Sodium (ARTIFICIAL TEARS OP) Place 1 drop into the right eye 3 (three) times daily.    [provider]  citalopram (CELEXA) 20 MG tablet Take 20 mg by mouth daily. 04/05/17   [provider]  ELIQUIS 5 MG TABS tablet TAKE ONE TABLET BY MOUTH TWICE DAILY Patient taking differently: Take 5 mg by mouth 2 (two) times daily.  08/28/18   [name], MD  Emollient (GOLD BOND MEDICATED BODY) 5-0.5 % LOTN Apply 1 application topically at bedtime. Apply to left wrist    [provider]  fluticasone (FLONASE) 50 MCG/ACT nasal spray Place 1 spray into both nostrils daily.    [provider]  Fluticasone-Salmeterol (ADVAIR) 250-50 MCG/DOSE AEPB Inhale 1 puff into the lungs 2 (two) times daily.    [provider]  lansoprazole (PREVACID) 30 MG capsule Take 30 mg by mouth daily.      [provider]  lisinopril (ZESTRIL) 2.5 MG tablet Take 1 tablet (2.5 mg total) by mouth daily. Please make overdue appt with Dr. [name] before anymore refills. 1st attempt 10/15/19   Marinus Maw, MD  loratadine (CLARITIN) 10 MG tablet Take 10 mg by mouth at bedtime.    [provider]  meloxicam (MOBIC) 15 MG tablet Take 15 mg by mouth daily.    [provider]  metoprolol succinate (TOPROL-XL) 100 MG 24 hr tablet TAKE ONE (1) TABLET EACH DAY 10/15/19   [name], MD  Multiple Vitamin (MULTIVITAMIN WITH MINERALS) TABS Take 1 tablet by  mouth daily.    [provider]  Omega-3 Fatty Acids (FISH OIL) 1200 MG CAPS Take 1,200 mg by mouth daily.    [provider]  oxyCODONE-acetaminophen (PERCOCET) 10-325 MG per tablet Take 1 tablet by mouth every 6 (six) hours as needed for pain.     [provider]  polyethylene glycol (MIRALAX / GLYCOLAX) packet Take 17 g by mouth daily as needed for mild constipation. 06/10/17   Danford, Earl Lites, MD  potassium chloride SA (K-DUR,KLOR-CON) 20 MEQ tablet Take 1 tablet  (20 mEq total) by mouth daily. 08/19/17   [name], MD  promethazine (PHENERGAN) 25 MG tablet Take 12.5-25 mg by mouth every 8 (eight) hours as needed for nausea or vomiting.  01/21/17   [provider]  rosuvastatin (CRESTOR) 40 MG tablet TAKE ONE (1) TABLET EACH DAY 10/18/20   [name], MD  spironolactone (ALDACTONE) 25 MG tablet TAKE ONE (1) TABLET EACH DAY 06/07/20   [name], MD  tamsulosin (FLOMAX) 0.4 MG CAPS capsule Take 1 capsule (0.4 mg total) by mouth daily. 06/11/17   Danford, Earl Lites, MD  tiZANidine (ZANAFLEX) 4 MG tablet Take 4 mg by mouth 2 (two) times daily.  09/29/11   [provider]  torsemide (DEMADEX) 20 MG tablet TAKE ONE (1) TABLET EACH DAY 10/15/19   [name], MD      Allergies    Codeine, Xarelto [rivaroxaban], and Vioxx [rofecoxib]    Review of Systems   Review of Systems  Constitutional:  Positive for fatigue. Negative for fever.  Eyes:  Negative for visual disturbance.  Respiratory:  Positive for shortness of breath. Negative for cough and hemoptysis.   Cardiovascular:  Negative for chest pain.  Gastrointestinal:  Positive for nausea. Negative for abdominal pain and vomiting.  Genitourinary:  Positive for difficulty urinating, dysuria and frequency.  Neurological:  Positive for light-headedness.    Physical Exam Updated Vital Signs BP (!) 184/109 (BP Location: Right Arm)   Pulse 71   Temp 97.7 F (36.5 C) (Oral)   Resp 18   Ht 5\' 9"  (1.753 m)   Wt 124.7 kg   SpO2 95%   BMI 40.61 kg/m  Physical Exam Vitals and nursing note reviewed.  Constitutional:      General: He is not in acute distress.    Appearance: He is well-developed. He is obese.  HENT:     Head: Normocephalic and atraumatic.  Eyes:     Conjunctiva/sclera: Conjunctivae normal.  Cardiovascular:     Rate and Rhythm: Normal rate and regular rhythm.     Heart sounds: No murmur heard. Pulmonary:     Effort: Pulmonary effort is normal. No  respiratory distress.     Breath sounds: Normal breath sounds.  Abdominal:     Palpations: Abdomen is soft.     Tenderness: There is no abdominal tenderness.  Musculoskeletal:        General: No swelling.     Cervical back: Neck supple.     Right lower leg: No tenderness.     Left lower leg: No tenderness.  Skin:    General: Skin is warm and dry.     Capillary Refill: Capillary refill takes less than 2 seconds.  Neurological:     General: No focal deficit present.     Mental Status: He is alert.     ED Results / Procedures / Treatments   Labs (all labs ordered are listed, but only abnormal results are displayed) Labs Reviewed  BASIC METABOLIC PANEL - Abnormal; Notable for the following components:      Result Value   Calcium 8.7 (*)    All other components within normal limits  CBC WITH DIFFERENTIAL/PLATELET - Abnormal; Notable for the following components:   RDW 16.1 (*)    All other components within normal limits  BRAIN NATRIURETIC PEPTIDE - Abnormal; Notable for the following components:   B Natriuretic Peptide 1,043.0 (*)    All other components within normal limits  URINALYSIS, ROUTINE W REFLEX MICROSCOPIC - Abnormal; Notable for the following components:   Glucose, UA >=500 (*)    Hgb urine dipstick SMALL (*)    Ketones, ur 5 (*)    Protein, ur 100 (*)    All other components within normal limits  URINE CULTURE  TROPONIN I (HIGH SENSITIVITY)  TROPONIN I (HIGH SENSITIVITY)    EKG EKG Interpretation  Date/Time:  Thursday September 07 2021 08:37:23 EDT Ventricular Rate:  86 PR Interval:  198 QRS Duration: 109 QT Interval:  413 QTC Calculation: 494 R Axis:   68 Text Interpretation: Sinus or ectopic atrial rhythm Anterior infarct, old Borderline repolarization abnormality No significant change since prior 8/20 Confirmed by Meridee Score [phone]) on 09/07/2021 8:46:50 AM  Radiology DG Chest Port 1 View  Result Date: 09/07/2021 CLINICAL DATA:  Shortness of breath  EXAM: PORTABLE CHEST 1 VIEW COMPARISON:  Chest x-ray dated June 03, 2017 FINDINGS: Stable cardiomegaly. Prior median sternotomy and CABG. Right chest wall AICD with unchanged lead position. Lungs are clear. No pleural effusion or pneumothorax. IMPRESSION: No active disease. Electronically Signed   By: Allegra Lai M.D.   On: 09/07/2021 08:41    Procedures Procedures    Medications Ordered in ED Medications  potassium chloride SA (KLOR-CON M) CR tablet 20 mEq (20 mEq Oral Given 09/07/21 1020)    ED Course/ Medical Decision Making/ A&P Clinical Course as of 09/07/21 1848  Thu Sep 07, 2021  0845 Chest x-ray interpreted by me as cardiomegaly AICD no gross infiltrate.  Awaiting radiology reading. [MB]  1217 Since work-up has been fairly unremarkable other than elevated BNP.  Given an extra dose of Demadex here.  He is comfortable plan for outpatient follow-up with his primary care doctor and his cardiologist.  His blood pressure is elevated here although he had not taken any of his morning meds so he wants to go home and eat and then taking his morning meds.  Return instructions discussed [MB]    Clinical Course User Index [MB] Terrilee Files, MD                           Medical Decision Making Amount and/or Complexity of Data Reviewed Labs: ordered. Radiology: ordered.  Risk Prescription drug management.  This patient complains of urinary hesitancy and frequency, shortness of breath; this involves an extensive number of treatment Options and is a complaint that carries with it a high risk of complications and morbidity. The differential includes UTI, retention, obstruction, CHF, COPD, pneumonia  I ordered, reviewed and interpreted labs, which included CBC with normal white count normal hemoglobin, chemistries normal, urinalysis without signs of infection, troponins flat, BNP elevated I ordered medication oral potassium and oral Demadex and reviewed PMP when indicated. I ordered  imaging studies which included chest x-ray and I independently    visualized and interpreted imaging which showed no acute findings Previous records obtained and reviewed in epic including recent cardiology notes and  PCP notes  Cardiac monitoring reviewed, normal sinus rhythm Social determinants considered, no significant barriers Critical Interventions: None  After the interventions stated above, I reevaluated the patient and found patient to be fairly comfortable appearing here Admission and further testing considered, no indications for admission or further work-up at this time.  Recommended close follow-up with his treatment teams.  Return instructions discussed          Final Clinical Impression(s) / ED Diagnoses Final diagnoses:  Acute on chronic congestive heart failure, unspecified heart failure type [facility])  Dysuria    Rx / DC Orders ED Discharge Orders     None         Terrilee Files, MD 09/07/21 1851

## 2021-09-08 LAB — URINE CULTURE: Culture: NO GROWTH

## 2022-04-04 ENCOUNTER — Ambulatory Visit
Admission: EM | Admit: 2022-04-04 | Discharge: 2022-04-04 | Disposition: A | Discharge status: Home / Self Care | Payer: 59 | Attending: Nurse Practitioner | Admitting: Nurse Practitioner

## 2022-04-04 ENCOUNTER — Other Ambulatory Visit: Payer: Self-pay

## 2022-04-04 ENCOUNTER — Ambulatory Visit (INDEPENDENT_AMBULATORY_CARE_PROVIDER_SITE_OTHER): Payer: 59

## 2022-04-04 ENCOUNTER — Encounter: Payer: Self-pay | Admitting: Emergency Medicine

## 2022-04-04 DIAGNOSIS — M79671 Pain in right foot: Secondary | ICD-10-CM

## 2022-04-04 DIAGNOSIS — S93601A Unspecified sprain of right foot, initial encounter: Secondary | ICD-10-CM | POA: Diagnosis not present

## 2022-04-04 DIAGNOSIS — M25511 Pain in right shoulder: Secondary | ICD-10-CM

## 2022-04-04 DIAGNOSIS — W19XXXA Unspecified fall, initial encounter: Secondary | ICD-10-CM

## 2022-04-04 DIAGNOSIS — M25561 Pain in right knee: Secondary | ICD-10-CM

## 2022-04-04 DIAGNOSIS — M25522 Pain in left elbow: Secondary | ICD-10-CM

## 2022-04-04 NOTE — Discharge Instructions (Addendum)
The x-ray of the right foot is negative for fracture or dislocation. May take over-the-counter arthritis strength Tylenol as directed. Continue the use of ice and heat.  Apply ice for pain or swelling, heat for spasm or stiffness.  Apply for 20 minutes, remove for 1 hour, then repeat is much as possible. Wear the Ace wrap when you are engaging in prolonged or strenuous activity. Gentle stretching and range of motion exercises are recommended for the right foot and ankle. Recommend following up with orthopedics for further evaluation for continued or unresolving pain.  You can follow-up with [facility] at [phone] or with [facility] at [phone]. Follow-up as needed.

## 2022-04-04 NOTE — ED Triage Notes (Signed)
Pt reports was sitting on the side of bathtub and reports slid down into tub. Pt reports right knee pain, left elbow/shoulder pain ever since. Pt able to bear weight and move extremities in triage. No obvious deformity noted.

## 2022-04-04 NOTE — ED Provider Notes (Signed)
[facility]    CSN: [id number] Arrival date & time: 04/04/22  1017      History   Chief Complaint Chief Complaint  Patient presents with   Fall    HPI Douglas [name] is a 67 y.o. male.   The history is provided by the patient.   The patient presents for complaints of right foot/ankle pain, right shoulder, right knee and left elbow pain after he slid into his bathtub last evening.  Patient states that he slid backwards on his right foot, but that he did not fall directly onto the right shoulder or right knee.  Patient states that since that time, he has had swelling and pain that radiates into the right foot, along with pain in the right shoulder with decreased range of motion.  Patient states that he is able to ambulate, but has pain.  He states approximately 2 weeks ago, he was thrown off of a horse.  Patient denies numbness, tingling, or radiation of pain.  Patient reports he has been using ice and heat for his symptoms.  Past Medical History:  Diagnosis Date   A-fib [facility])    AICD (automatic cardioverter/defibrillator) present    Anemia    Asthma    CAD (coronary artery disease)    CHF (congestive heart failure) (HCC)    Diabetes mellitus without complication ([place])    Dyslipidemia    Elevated cholesterol    Hypertension     Patient Active Problem List   Diagnosis Date Noted   Multiple trauma 06/02/2017   Acute on chronic combined systolic and diastolic CHF (congestive heart failure) ([place]) 06/02/2017   Loss of consciousness ([place]) 06/02/2017   Acute on chronic respiratory failure with hypoxia (HCC) 06/02/2017   Multiple closed fractures of ribs of right side 06/02/2017   Contusion of right lung without open wound into thorax 06/02/2017   Leukocytosis 06/02/2017   Elevated random blood glucose level 06/02/2017   Coronary artery disease 06/02/2017   Pacemaker 06/02/2017   Diverticulosis 06/02/2017   Closed L1 vertebral fracture ([place]) 06/02/2017   Closed  L2 vertebral fracture ([place]) 06/02/2017   Vertebral compression fracture ([place]) 06/02/2017   Closed fracture of right orbital floor ([place]) 06/02/2017   Right orbit fracture, closed, initial encounter ([place]) 06/02/2017   Rupture of globe of eye following blunt trauma, right, initial encounter 06/02/2017   Contusion of face 06/02/2017   Spondylosis of cervical spine 06/02/2017   Persistent atrial fibrillation ([place]) 06/02/2017   Coagulopathy ([place]) 06/02/2017   Ganglion cyst of dorsum of right wrist 11/04/2015   Polypharmacy 11/04/2015   Allergic rhinitis 12/27/2014   Asthma 12/27/2014   Chronic low back pain 12/27/2014   Chronic pain 12/27/2014   Depression 12/27/2014   Gastroesophageal reflux disease without esophagitis 12/27/2014   Generalized anxiety disorder 12/27/2014   Impaired fasting glucose 12/27/2014   Insomnia 12/27/2014   Right shoulder pain 12/27/2014   Nausea 12/27/2014   Class 2 obesity due to excess calories in adult 12/27/2014   Hyperlipidemia 12/27/2014   DJD (degenerative joint disease), lumbosacral 08/26/2013   Seizure ([place]) 08/25/2013   Atrial fibrillation ([place]) 08/25/2013   Substance abuse ([place]) 08/25/2013   Ventricular tachycardia ([place]) 05/23/2011   Coronary arteriosclerosis    DYSLIPIDEMIA 05/01/2010   MORBID OBESITY 05/01/2010   Essential hypertension 05/01/2010   CARDIOMYOPATHY, ISCHEMIC 05/01/2010   Automatic implantable cardioverter-defibrillator in situ 05/01/2010    Past Surgical History:  Procedure Laterality Date   [place]  stent placement   CARDIAC DEFIBRILLATOR PLACEMENT     CORONARY ARTERY BYPASS GRAFT  2002   times 4   CORONARY ARTERY BYPASS GRAFT     EYE SURGERY     ICD GENERATOR CHANGEOUT N/A 09/30/2018   Procedure: ICD GENERATOR CHANGEOUT;  Surgeon: Evans Lance, MD;  Location: [place] [facility];  Service: Cardiovascular;  Laterality: N/A;   IMPLANTABLE CARDIOVERTER DEFIBRILLATOR (ICD) GENERATOR CHANGE N/A  09/10/2011   Procedure: ICD GENERATOR CHANGE;  Surgeon: Evans Lance, MD;  Location[facility] CATH LAB;  Service: Cardiovascular;  Laterality: N/A;   INSERT / REPLACE / REMOVE PACEMAKER     LEFT HEART CATHETERIZATION WITH CORONARY ANGIOGRAM N/A 08/29/2011   Procedure: LEFT HEART CATHETERIZATION WITH CORONARY ANGIOGRAM;  Surgeon: Peter M Martinique, MD;  Location: [facility] CATH LAB;  Service: Cardiovascular;  Laterality: N/A;       Home Medications    Prior to Admission medications   Medication Sig Start Date End Date Taking? Authorizing Provider  albuterol (PROVENTIL HFA;VENTOLIN HFA) 108 (90 Base) MCG/ACT inhaler Inhale 2 puffs into the lungs every 6 (six) hours as needed for wheezing or shortness of breath.  04/22/17   [provider]  ALPRAZolam Duanne Moron) 1 MG tablet Take 1-2 mg by mouth 2 (two) times daily as needed for anxiety. 06/27/21   [provider]  Carboxymethylcellulose Sodium (ARTIFICIAL TEARS OP) Place 1 drop into the right eye 3 (three) times daily.    [provider]  citalopram (CELEXA) 20 MG tablet Take 20 mg by mouth daily. 04/05/17   [provider]  ELIQUIS 5 MG TABS tablet TAKE ONE TABLET BY MOUTH TWICE DAILY Patient taking differently: Take 5 mg by mouth 2 (two) times daily. 08/28/18   Minus Breeding, MD  ENTRESTO 97-103 MG Take 1 tablet by mouth 2 (two) times daily. 08/30/21   [provider]  ezetimibe (ZETIA) 10 MG tablet Take 10 mg by mouth daily. 06/01/21   [provider]  fluticasone (FLONASE) 50 MCG/ACT nasal spray Place 1 spray into both nostrils daily.    [provider]  Fluticasone-Salmeterol (ADVAIR) 250-50 MCG/DOSE AEPB Inhale 1 puff into the lungs 2 (two) times daily.    [provider]  JARDIANCE 10 MG TABS tablet Take 10 mg by mouth daily. 06/27/21   [provider]  lansoprazole (PREVACID) 30 MG capsule Take 30 mg by mouth daily.      [provider]  lisinopril (ZESTRIL) 2.5 MG tablet Take  1 tablet (2.5 mg total) by mouth daily. Please make overdue appt with Dr. Lovena Le before anymore refills. 1st attempt Patient not taking: Reported on 09/07/2021 10/15/19   Evans Lance, MD  loratadine (CLARITIN) 10 MG tablet Take 10 mg by mouth at bedtime.    [provider]  metoprolol succinate (TOPROL-XL) 100 MG 24 hr tablet TAKE ONE (1) TABLET EACH DAY 10/15/19   Minus Breeding, MD  Multiple Vitamin (MULTIVITAMIN WITH MINERALS) TABS Take 1 tablet by mouth daily.    [provider]  Omega-3 Fatty Acids (FISH OIL) 1200 MG CAPS Take 1,200 mg by mouth daily.    [provider]  oxyCODONE-acetaminophen (PERCOCET) 10-325 MG per tablet Take 1 tablet by mouth every 6 (six) hours as needed for pain.     [provider]  polyethylene glycol (MIRALAX / GLYCOLAX) packet Take 17 g by mouth daily as needed for mild constipation. 06/10/17   Danford, Suann Larry, MD  potassium chloride SA (K-DUR,KLOR-CON) 20  MEQ tablet Take 1 tablet (20 mEq total) by mouth daily. Patient not taking: Reported on 09/07/2021 08/19/17   [name], MD  promethazine (PHENERGAN) 25 MG tablet Take 12.5-25 mg by mouth every 8 (eight) hours as needed for nausea or vomiting.  Patient not taking: Reported on 09/07/2021 01/21/17   [provider]  rosuvastatin (CRESTOR) 40 MG tablet TAKE ONE (1) TABLET EACH DAY 10/18/20   [name], MD  spironolactone (ALDACTONE) 25 MG tablet TAKE ONE (1) TABLET EACH DAY 06/07/20   [name], MD  tamsulosin (FLOMAX) 0.4 MG CAPS capsule Take 1 capsule (0.4 mg total) by mouth daily. 06/11/17   Danford, Earl Lites, MD  tiZANidine (ZANAFLEX) 4 MG tablet Take 4 mg by mouth 2 (two) times daily.  09/29/11   [provider]  torsemide (DEMADEX) 20 MG tablet TAKE ONE (1) TABLET EACH DAY 10/15/19   [name], MD    Family History Family History  Problem Relation Age of Onset   Heart attack Father     Social History Social History    Tobacco Use   Smoking status: Never   Smokeless tobacco: Never  Vaping Use   Vaping Use: Never used  Substance Use Topics   Alcohol use: Never   Drug use: Never    Comment: marijuana     Allergies   Codeine, Xarelto [rivaroxaban], and Vioxx [rofecoxib]   Review of Systems Review of Systems Per HPI  Physical Exam Triage Vital Signs ED Triage Vitals  Enc Vitals Group     BP 04/04/22 1208 135/88     Pulse Rate 04/04/22 1208 71     Resp 04/04/22 1208 20     Temp 04/04/22 1208 97.7 F (36.5 C)     Temp Source 04/04/22 1208 Oral     SpO2 04/04/22 1208 94 %     Weight --      Height --      Head Circumference --      Peak Flow --      Pain Score 04/04/22 1207 8     Pain Loc --      Pain Edu? --      Excl. in GC? --    No data found.  Updated Vital Signs BP 135/88 (BP Location: Right Arm)   Pulse 71   Temp 97.7 F (36.5 C) (Oral)   Resp 20   SpO2 94%   Visual Acuity Right Eye Distance:   Left Eye Distance:   Bilateral Distance:    Right Eye Near:   Left Eye Near:    Bilateral Near:     Physical Exam Vitals and nursing note reviewed.  HENT:     Head: Normocephalic.  Eyes:     Extraocular Movements: Extraocular movements intact.     Pupils: Pupils are equal, round, and reactive to light.  Musculoskeletal:     Right shoulder: No swelling, deformity or tenderness. Decreased range of motion. Normal strength. Normal pulse.     Left elbow: No swelling. Normal range of motion. Tenderness present in olecranon process.     Cervical back: Normal range of motion.     Right knee: Tenderness present over the medial joint line, lateral joint line, MCL and LCL.     Right ankle: No swelling. Tenderness present over the lateral malleolus. Decreased range of motion. Normal pulse.     Right foot: Decreased range of motion. Normal capillary refill. Tenderness (Tenderness noted to the lateral malleolus and dorsal aspect of  the right foot) present. No swelling or  deformity. Normal pulse.  Lymphadenopathy:     Cervical: No cervical adenopathy.  Skin:    General: Skin is warm and dry.  Neurological:     General: No focal deficit present.     Mental Status: He is oriented to person, place, and time.  Psychiatric:        Mood and Affect: Mood normal.        Behavior: Behavior normal.      UC Treatments / Results  Labs (all labs ordered are listed, but only abnormal results are displayed) Labs Reviewed - No data to display  EKG   Radiology DG Foot Complete Right  Result Date: 04/04/2022 CLINICAL DATA:  Right foot pain after fall last week. EXAM: RIGHT FOOT COMPLETE - 3+ VIEW COMPARISON:  None Available. FINDINGS: There is no evidence of fracture or dislocation. There is no evidence of arthropathy or other focal bone abnormality. Soft tissues are unremarkable. IMPRESSION: Negative. Electronically Signed   By: Marijo Conception M.D.   On: 04/04/2022 12:49    Procedures Procedures (including critical care time)  Medications Ordered in UC Medications - No data to display  Initial Impression / Assessment and Plan / UC Course  I have reviewed the triage vital signs and the nursing notes.  Pertinent labs & imaging results that were available during my care of the patient were reviewed by me and considered in my medical decision making (see chart for details).  The patient is well-appearing, he is in no acute distress, vital signs are stable.  X-ray of the right foot is negative.  Will forego imaging of the right shoulder and right knee as patient denies falling onto the right knee and right shoulder.  Suspect the conditions of the right shoulder and right knee are most likely chronic as patient was seen for the same or similar symptoms within the past 2 weeks.  Because patient is continuing to experience pain, recommend that he follow-up with orthopedics for further evaluation.  Ace wrap was provided for the right foot to provide compression and  support.  Supportive care recommendations were provided to the patient to include continued use of ice and heat, and use of over-the-counter arthritis strength Tylenol to help with pain or discomfort.  Patient was given info for [facility] and for [facility] for follow-up.  Patient verbalizes understanding.  All questions were answered.  Patient is stable for discharge.   Final Clinical Impressions(s) / UC Diagnoses   Final diagnoses:  Sprain of right foot, initial encounter  Fall, initial encounter  Right knee pain, unspecified chronicity  Right shoulder pain, unspecified chronicity  Left elbow pain     Discharge Instructions      The x-ray of the right foot is negative for fracture or dislocation. May take over-the-counter arthritis strength Tylenol as directed. Continue the use of ice and heat.  Apply ice for pain or swelling, heat for spasm or stiffness.  Apply for 20 minutes, remove for 1 hour, then repeat is much as possible. Wear the Ace wrap when you are engaging in prolonged or strenuous activity. Gentle stretching and range of motion exercises are recommended for the right foot and ankle. Recommend following up with orthopedics for further evaluation for continued or unresolving pain.  You can follow-up with [facility] at [phone] or with [facility] at [phone]. Follow-up as needed.      ED Prescriptions   None  PDMP not reviewed this encounter.   Abran Cantor, NP 04/04/22 1302

## 2022-06-26 ENCOUNTER — Emergency Department (HOSPITAL_COMMUNITY): Payer: 59

## 2022-06-26 ENCOUNTER — Other Ambulatory Visit: Payer: Self-pay

## 2022-06-26 ENCOUNTER — Emergency Department (HOSPITAL_COMMUNITY)
Admission: EM | Admit: 2022-06-26 | Discharge: 2022-06-26 | Disposition: A | Discharge status: Home / Self Care | Payer: 59 | Attending: Emergency Medicine | Admitting: Emergency Medicine

## 2022-06-26 ENCOUNTER — Encounter (HOSPITAL_COMMUNITY): Payer: Self-pay

## 2022-06-26 DIAGNOSIS — I509 Heart failure, unspecified: Secondary | ICD-10-CM | POA: Diagnosis not present

## 2022-06-26 DIAGNOSIS — Z7901 Long term (current) use of anticoagulants: Secondary | ICD-10-CM | POA: Insufficient documentation

## 2022-06-26 DIAGNOSIS — I4891 Unspecified atrial fibrillation: Secondary | ICD-10-CM | POA: Insufficient documentation

## 2022-06-26 DIAGNOSIS — S0121XA Laceration without foreign body of nose, initial encounter: Secondary | ICD-10-CM

## 2022-06-26 DIAGNOSIS — S0181XA Laceration without foreign body of other part of head, initial encounter: Secondary | ICD-10-CM | POA: Insufficient documentation

## 2022-06-26 DIAGNOSIS — I110 Hypertensive heart disease with heart failure: Secondary | ICD-10-CM | POA: Insufficient documentation

## 2022-06-26 DIAGNOSIS — E119 Type 2 diabetes mellitus without complications: Secondary | ICD-10-CM | POA: Diagnosis not present

## 2022-06-26 DIAGNOSIS — Z79899 Other long term (current) drug therapy: Secondary | ICD-10-CM | POA: Insufficient documentation

## 2022-06-26 DIAGNOSIS — S0993XA Unspecified injury of face, initial encounter: Secondary | ICD-10-CM | POA: Diagnosis present

## 2022-06-26 MED ORDER — LIDOCAINE HCL (PF) 1 % IJ SOLN
5.0000 mL | Freq: Once | INTRAMUSCULAR | Status: AC
  Administered 2022-06-26: 5 mL
  Filled 2022-06-26: qty 5

## 2022-06-26 NOTE — ED Notes (Signed)
Patient transported to CT 

## 2022-06-26 NOTE — ED Provider Notes (Signed)
[facility] Provider Note   CSN: [id number] Arrival date & time: 06/26/22  1721     History  Chief Complaint  Patient presents with   Douglas [name] is a 67 y.o. male.  Patient presents to the emergency department complaining of a head injury secondary to a fall.  Patient states he was riding his horse when it became startled by a snake.  The horse threw the patient backwards and the patient hit his head on a rock.  He also states that the horse fell backwards hitting him in the face.  He has a laceration in the center of his forehead and a second laceration on the bridge of his nose.  He denies losing consciousness.  The patient does endorse taking Eliquis.  Past medical history significant for coronary artery disease, hypertension, CHF, atrial fibrillation, type II DM  HPI     Home Medications Prior to Admission medications   Medication Sig Start Date End Date Taking? Authorizing Provider  albuterol (PROVENTIL HFA;VENTOLIN HFA) 108 (90 Base) MCG/ACT inhaler Inhale 2 puffs into the lungs every 6 (six) hours as needed for wheezing or shortness of breath.  04/22/17   [provider]  ALPRAZolam Prudy Feeler) 1 MG tablet Take 1-2 mg by mouth 2 (two) times daily as needed for anxiety. 06/27/21   [provider]  Carboxymethylcellulose Sodium (ARTIFICIAL TEARS OP) Place 1 drop into the right eye 3 (three) times daily.    [provider]  citalopram (CELEXA) 20 MG tablet Take 20 mg by mouth daily. 04/05/17   [provider]  ELIQUIS 5 MG TABS tablet TAKE ONE TABLET BY MOUTH TWICE DAILY Patient taking differently: Take 5 mg by mouth 2 (two) times daily. 08/28/18   [name], MD  ENTRESTO 97-103 MG Take 1 tablet by mouth 2 (two) times daily. 08/30/21   [provider]  ezetimibe (ZETIA) 10 MG tablet Take 10 mg by mouth daily. 06/01/21   [provider]  fluticasone (FLONASE) 50 MCG/ACT nasal  spray Place 1 spray into both nostrils daily.    [provider]  Fluticasone-Salmeterol (ADVAIR) 250-50 MCG/DOSE AEPB Inhale 1 puff into the lungs 2 (two) times daily.    [provider]  JARDIANCE 10 MG TABS tablet Take 10 mg by mouth daily. 06/27/21   [provider]  lansoprazole (PREVACID) 30 MG capsule Take 30 mg by mouth daily.      [provider]  lisinopril (ZESTRIL) 2.5 MG tablet Take 1 tablet (2.5 mg total) by mouth daily. Please make overdue appt with Dr. [name] before anymore refills. 1st attempt Patient not taking: Reported on 09/07/2021 10/15/19   Marinus Maw, MD  loratadine (CLARITIN) 10 MG tablet Take 10 mg by mouth at bedtime.    [provider]  metoprolol succinate (TOPROL-XL) 100 MG 24 hr tablet TAKE ONE (1) TABLET EACH DAY 10/15/19   [name], MD  Multiple Vitamin (MULTIVITAMIN WITH MINERALS) TABS Take 1 tablet by mouth daily.    [provider]  Omega-3 Fatty Acids (FISH OIL) 1200 MG CAPS Take 1,200 mg by mouth daily.    [provider]  oxyCODONE-acetaminophen (PERCOCET) 10-325 MG per tablet Take 1 tablet by mouth every 6 (six) hours as needed for pain.     [provider]  polyethylene glycol (MIRALAX / GLYCOLAX) packet Take 17 g by mouth daily as needed for mild constipation. 06/10/17   Joen Laura  P, MD  potassium chloride SA (K-DUR,KLOR-CON) 20 MEQ tablet Take 1 tablet (20 mEq total) by mouth daily. Patient not taking: Reported on 09/07/2021 08/19/17   [name], MD  promethazine (PHENERGAN) 25 MG tablet Take 12.5-25 mg by mouth every 8 (eight) hours as needed for nausea or vomiting.  Patient not taking: Reported on 09/07/2021 01/21/17   [provider]  rosuvastatin (CRESTOR) 40 MG tablet TAKE ONE (1) TABLET EACH DAY 10/18/20   [name], MD  spironolactone (ALDACTONE) 25 MG tablet TAKE ONE (1) TABLET EACH DAY 06/07/20   [name], MD  tamsulosin (FLOMAX) 0.4 MG  CAPS capsule Take 1 capsule (0.4 mg total) by mouth daily. 06/11/17   Danford, Earl Lites, MD  tiZANidine (ZANAFLEX) 4 MG tablet Take 4 mg by mouth 2 (two) times daily.  09/29/11   [provider]  torsemide (DEMADEX) 20 MG tablet TAKE ONE (1) TABLET EACH DAY 10/15/19   [name], MD      Allergies    Codeine, Xarelto [rivaroxaban], and Vioxx [rofecoxib]    Review of Systems   Review of Systems  Physical Exam Updated Vital Signs BP 113/86 (BP Location: Right Arm)   Pulse 75   Temp 98.6 F (37 C) (Oral)   Resp 14   Ht 5\' 9"  (1.753 m)   Wt 112.5 kg   SpO2 96%   BMI 36.62 kg/m  Physical Exam Vitals and nursing note reviewed.  Constitutional:      General: He is not in acute distress.    Appearance: He is well-developed.  HENT:     Head: Normocephalic.   Eyes:     Conjunctiva/sclera: Conjunctivae normal.  Cardiovascular:     Rate and Rhythm: Normal rate and regular rhythm.     Heart sounds: No murmur heard. Pulmonary:     Effort: Pulmonary effort is normal. No respiratory distress.     Breath sounds: Normal breath sounds.  Abdominal:     Palpations: Abdomen is soft.     Tenderness: There is no abdominal tenderness.  Musculoskeletal:        General: No swelling or tenderness.     Cervical back: Neck supple.  Skin:    General: Skin is warm and dry.     Capillary Refill: Capillary refill takes less than 2 seconds.  Neurological:     Mental Status: He is alert.  Psychiatric:        Mood and Affect: Mood normal.     ED Results / Procedures / Treatments   Labs (all labs ordered are listed, but only abnormal results are displayed) Labs Reviewed - No data to display  EKG None  Radiology CT Head Wo Contrast  Result Date: 06/26/2022 CLINICAL DATA:  Fall from horse EXAM: CT HEAD WITHOUT CONTRAST CT MAXILLOFACIAL WITHOUT CONTRAST TECHNIQUE: Multidetector CT imaging of the head and maxillofacial structures were performed using the standard protocol  without intravenous contrast. Multiplanar CT image reconstructions of the maxillofacial structures were also generated. RADIATION DOSE REDUCTION: This exam was performed according to the departmental dose-optimization program which includes automated exposure control, adjustment of the mA and/or kV according to patient size and/or use of iterative reconstruction technique. COMPARISON:  Maxillofacial CT 06/02/2017 FINDINGS: CT HEAD FINDINGS Brain: There is no mass, hemorrhage or extra-axial collection. The size and configuration of the ventricles and extra-axial CSF spaces are normal. The brain parenchyma is normal, without evidence of acute or chronic infarction. Vascular: No hyperdense vessel or unexpected vascular calcification. Skull: The  visualized skull base, calvarium and extracranial soft tissues are normal. CT MAXILLOFACIAL FINDINGS Osseous: No facial fracture or mandibular dislocation Orbits: Chronic shrunken right globe normal extraocular muscles and intraorbital fat. Sinuses: No acute finding. Soft tissues: Normal visualized extracranial soft tissues. IMPRESSION: 1. No acute intracranial abnormality. 2. No facial fracture. Electronically Signed   By: Deatra Robinson M.D.   On: 06/26/2022 18:43   CT Maxillofacial Wo Contrast  Result Date: 06/26/2022 CLINICAL DATA:  Fall from horse EXAM: CT HEAD WITHOUT CONTRAST CT MAXILLOFACIAL WITHOUT CONTRAST TECHNIQUE: Multidetector CT imaging of the head and maxillofacial structures were performed using the standard protocol without intravenous contrast. Multiplanar CT image reconstructions of the maxillofacial structures were also generated. RADIATION DOSE REDUCTION: This exam was performed according to the departmental dose-optimization program which includes automated exposure control, adjustment of the mA and/or kV according to patient size and/or use of iterative reconstruction technique. COMPARISON:  Maxillofacial CT 06/02/2017 FINDINGS: CT HEAD FINDINGS Brain:  There is no mass, hemorrhage or extra-axial collection. The size and configuration of the ventricles and extra-axial CSF spaces are normal. The brain parenchyma is normal, without evidence of acute or chronic infarction. Vascular: No hyperdense vessel or unexpected vascular calcification. Skull: The visualized skull base, calvarium and extracranial soft tissues are normal. CT MAXILLOFACIAL FINDINGS Osseous: No facial fracture or mandibular dislocation Orbits: Chronic shrunken right globe normal extraocular muscles and intraorbital fat. Sinuses: No acute finding. Soft tissues: Normal visualized extracranial soft tissues. IMPRESSION: 1. No acute intracranial abnormality. 2. No facial fracture. Electronically Signed   By: Deatra Robinson M.D.   On: 06/26/2022 18:43    Procedures .Marland KitchenLaceration Repair  Date/Time: 06/26/2022 7:14 PM  Performed by: Darrick Grinder, PA-C Authorized by: Darrick Grinder, PA-C   Consent:    Consent obtained:  Verbal   Consent given by:  Patient   Risks discussed:  Pain, vascular damage, poor wound healing, poor cosmetic result, nerve damage, infection and need for additional repair   Alternatives discussed:  No treatment Universal protocol:    Procedure explained and questions answered to patient or proxy's satisfaction: yes     Immediately prior to procedure, a time out was called: yes     Patient identity confirmed:  Verbally with patient Anesthesia:    Anesthesia method:  Local infiltration   Local anesthetic:  Lidocaine 1% w/o epi Laceration details:    Location:  Face   Face location:  Forehead   Length (cm):  2.7   Depth (mm):  3 Exploration:    Hemostasis achieved with:  Direct pressure   Wound exploration: wound explored through full range of motion and entire depth of wound visualized   Treatment:    Area cleansed with:  Saline   Amount of cleaning:  Standard Skin repair:    Repair method:  Tissue adhesive Approximation:    Approximation:   Close Repair type:    Repair type:  Simple Post-procedure details:    Dressing:  Open (no dressing)   Procedure completion:  Tolerated well, no immediate complications .Marland KitchenLaceration Repair  Date/Time: 06/26/2022 7:16 PM  Performed by: Darrick Grinder, PA-C Authorized by: Darrick Grinder, PA-C   Consent:    Consent obtained:  Verbal   Consent given by:  Patient   Risks, benefits, and alternatives were discussed: yes     Risks discussed:  Infection, need for additional repair, nerve damage, poor wound healing, poor cosmetic result, pain and vascular damage   Alternatives discussed:  No treatment Universal  protocol:    Procedure explained and questions answered to patient or proxy's satisfaction: yes     Immediately prior to procedure, a time out was called: yes     Patient identity confirmed:  Verbally with patient Anesthesia:    Anesthesia method:  Local infiltration   Local anesthetic:  Lidocaine 1% w/o epi Laceration details:    Location:  Face   Face location:  Nose   Length (cm):  0.8   Depth (mm):  3 Exploration:    Hemostasis achieved with:  Direct pressure   Contaminated: no   Treatment:    Area cleansed with:  Saline   Amount of cleaning:  Standard Skin repair:    Repair method:  Sutures   Suture size:  6-0   Suture material:  Prolene   Suture technique:  Simple interrupted   Number of sutures:  2 Approximation:    Approximation:  Close Repair type:    Repair type:  Simple Post-procedure details:    Dressing:  Open (no dressing)   Procedure completion:  Tolerated well, no immediate complications     Medications Ordered in ED Medications  lidocaine (PF) (XYLOCAINE) 1 % injection 5 mL (5 mLs Infiltration Given by Other 06/26/22 1850)    ED Course/ Medical Decision Making/ A&P                             Medical Decision Making Amount and/or Complexity of Data Reviewed Radiology: ordered.  Risk Prescription drug management.   This patient  presents to the ED for concern of injuries to the head and face secondary to a fall, this involves an extensive number of treatment options, and is a complaint that carries with it a high risk of complications and morbidity.  The differential diagnosis includes intracranial abnormality, soft tissue injury, fracture, dislocation, others   Co morbidities that complicate the patient evaluation  Patient on chronic anticoagulation    Imaging Studies ordered:  I ordered imaging studies including CT head, CT maxillofacial I independently visualized and interpreted imaging which showed No acute intracranial abnormality, no facial fractures I agree with the radiologist interpretation   Test / Admission - Considered:  Patient with no intracranial abnormality, no facial fractures.  Patient with laceration to forehead and bridge of nose.  Patient requested Dermabond on forehead laceration.  6-0 Prolene utilized on nose laceration.  Please see procedure notes.  Patient will follow-up for suture removal in 7 to 10 days.  Instructions for wound care provided.  Chart review shows the patient is up-to-date on his Tdap vaccine.  Discharge home.        Final Clinical Impression(s) / ED Diagnoses Final diagnoses:  Fall from horse, initial encounter  Forehead laceration, initial encounter  Laceration of nose, initial encounter    Rx / DC Orders ED Discharge Orders     None         Pamala Duffel 06/26/22 1917    Eber Hong, MD 06/28/22 [phone]

## 2022-06-26 NOTE — ED Triage Notes (Addendum)
Denies any other pain sites. Endorses hx of back surgery.

## 2022-06-26 NOTE — Discharge Instructions (Addendum)
You were evaluated today after a fall from a horse.  Your CT scans were reassuring for no intracranial abnormalities, no facial bone fractures.  The forehead laceration was repaired with Dermabond, surgical glue.  Please allow this to fall off on its own over the next 1 to 2 weeks.  You may begin to get it wet in 24 hours.  The laceration on the bridge of your nose was repaired with 2 sutures which should be removed in 7 to 10 days.  They may be removed at the emergency department or at any medical provider's office.  If you develop any life-threatening symptoms please return to the emergency department.

## 2022-06-26 NOTE — ED Triage Notes (Signed)
Pt comes in POV [facility] and instructed to come here. Pt fell from horse and hit head on rock. Denies LOC, N/V, visual changes.  Pt with bruising and 2 lacs to forehead and nose. Not currently bleeding at this time.   Pt placed vasoline on wounds PTA.   Pt takes eliquis and entresto.  Pt A & O x 4, ambulatory in triage, NAD.

## 2022-09-11 DIAGNOSIS — K645 Perianal venous thrombosis: Secondary | ICD-10-CM | POA: Insufficient documentation

## 2023-04-23 ENCOUNTER — Other Ambulatory Visit: Payer: Self-pay

## 2023-04-23 ENCOUNTER — Emergency Department (HOSPITAL_COMMUNITY): Payer: 59

## 2023-04-23 ENCOUNTER — Encounter (HOSPITAL_COMMUNITY): Payer: Self-pay | Admitting: Emergency Medicine

## 2023-04-23 ENCOUNTER — Observation Stay (HOSPITAL_COMMUNITY)
Admission: EM | Admit: 2023-04-23 | Discharge: 2023-04-25 | Disposition: A | Discharge status: Home / Self Care | Payer: 59 | Attending: Family Medicine | Admitting: Family Medicine

## 2023-04-23 DIAGNOSIS — I959 Hypotension, unspecified: Secondary | ICD-10-CM | POA: Diagnosis not present

## 2023-04-23 DIAGNOSIS — I4581 Long QT syndrome: Secondary | ICD-10-CM | POA: Diagnosis not present

## 2023-04-23 DIAGNOSIS — Y92009 Unspecified place in unspecified non-institutional (private) residence as the place of occurrence of the external cause: Secondary | ICD-10-CM

## 2023-04-23 DIAGNOSIS — R531 Weakness: Secondary | ICD-10-CM | POA: Diagnosis not present

## 2023-04-23 DIAGNOSIS — I482 Chronic atrial fibrillation, unspecified: Secondary | ICD-10-CM | POA: Diagnosis not present

## 2023-04-23 DIAGNOSIS — J45909 Unspecified asthma, uncomplicated: Secondary | ICD-10-CM | POA: Insufficient documentation

## 2023-04-23 DIAGNOSIS — E46 Unspecified protein-calorie malnutrition: Secondary | ICD-10-CM | POA: Insufficient documentation

## 2023-04-23 DIAGNOSIS — I5022 Chronic systolic (congestive) heart failure: Secondary | ICD-10-CM | POA: Diagnosis not present

## 2023-04-23 DIAGNOSIS — S0003XA Contusion of scalp, initial encounter: Principal | ICD-10-CM | POA: Insufficient documentation

## 2023-04-23 DIAGNOSIS — M6281 Muscle weakness (generalized): Secondary | ICD-10-CM | POA: Insufficient documentation

## 2023-04-23 DIAGNOSIS — R7989 Other specified abnormal findings of blood chemistry: Secondary | ICD-10-CM

## 2023-04-23 DIAGNOSIS — N179 Acute kidney failure, unspecified: Secondary | ICD-10-CM | POA: Diagnosis not present

## 2023-04-23 DIAGNOSIS — R9431 Abnormal electrocardiogram [ECG] [EKG]: Secondary | ICD-10-CM | POA: Diagnosis not present

## 2023-04-23 DIAGNOSIS — R748 Abnormal levels of other serum enzymes: Secondary | ICD-10-CM | POA: Insufficient documentation

## 2023-04-23 DIAGNOSIS — Z951 Presence of aortocoronary bypass graft: Secondary | ICD-10-CM | POA: Insufficient documentation

## 2023-04-23 DIAGNOSIS — E440 Moderate protein-calorie malnutrition: Secondary | ICD-10-CM | POA: Insufficient documentation

## 2023-04-23 DIAGNOSIS — W19XXXA Unspecified fall, initial encounter: Secondary | ICD-10-CM | POA: Diagnosis not present

## 2023-04-23 DIAGNOSIS — K219 Gastro-esophageal reflux disease without esophagitis: Secondary | ICD-10-CM | POA: Diagnosis present

## 2023-04-23 DIAGNOSIS — Z95810 Presence of automatic (implantable) cardiac defibrillator: Secondary | ICD-10-CM | POA: Insufficient documentation

## 2023-04-23 DIAGNOSIS — R4182 Altered mental status, unspecified: Secondary | ICD-10-CM | POA: Diagnosis present

## 2023-04-23 DIAGNOSIS — I110 Hypertensive heart disease with heart failure: Secondary | ICD-10-CM | POA: Diagnosis not present

## 2023-04-23 DIAGNOSIS — I2510 Atherosclerotic heart disease of native coronary artery without angina pectoris: Secondary | ICD-10-CM | POA: Diagnosis not present

## 2023-04-23 DIAGNOSIS — Z7901 Long term (current) use of anticoagulants: Secondary | ICD-10-CM | POA: Diagnosis not present

## 2023-04-23 DIAGNOSIS — W1811XA Fall from or off toilet without subsequent striking against object, initial encounter: Secondary | ICD-10-CM | POA: Insufficient documentation

## 2023-04-23 DIAGNOSIS — E8809 Other disorders of plasma-protein metabolism, not elsewhere classified: Secondary | ICD-10-CM | POA: Insufficient documentation

## 2023-04-23 DIAGNOSIS — F419 Anxiety disorder, unspecified: Secondary | ICD-10-CM | POA: Diagnosis not present

## 2023-04-23 LAB — RESP PANEL BY RT-PCR (RSV, FLU A&B, COVID)  RVPGX2
Influenza A by PCR: NEGATIVE
Influenza B by PCR: NEGATIVE
Resp Syncytial Virus by PCR: NEGATIVE
SARS Coronavirus 2 by RT PCR: NEGATIVE

## 2023-04-23 LAB — COMPREHENSIVE METABOLIC PANEL
ALT: 37 U/L (ref 0–44)
AST: 40 U/L (ref 15–41)
Albumin: 3 g/dL — ABNORMAL LOW (ref 3.5–5.0)
Alkaline Phosphatase: 43 U/L (ref 38–126)
Anion gap: 12 (ref 5–15)
BUN: 44 mg/dL — ABNORMAL HIGH (ref 8–23)
CO2: 24 mmol/L (ref 22–32)
Calcium: 8.2 mg/dL — ABNORMAL LOW (ref 8.9–10.3)
Chloride: 98 mmol/L (ref 98–111)
Creatinine, Ser: 2.36 mg/dL — ABNORMAL HIGH (ref 0.61–1.24)
GFR, Estimated: 29 mL/min — ABNORMAL LOW (ref >60)
Glucose, Bld: 98 mg/dL (ref 70–99)
Potassium: 3.8 mmol/L (ref 3.5–5.1)
Sodium: 134 mmol/L — ABNORMAL LOW (ref 135–145)
Total Bilirubin: 0.7 mg/dL (ref 0.0–1.2)
Total Protein: 6.5 g/dL (ref 6.5–8.1)

## 2023-04-23 LAB — CBC WITH DIFFERENTIAL/PLATELET
Abs Immature Granulocytes: 0.03 10*3/uL (ref 0.00–0.07)
Basophils Absolute: 0 10*3/uL (ref 0.0–0.1)
Basophils Relative: 0 %
Eosinophils Absolute: 0.3 10*3/uL (ref 0.0–0.5)
Eosinophils Relative: 4 %
HCT: 40.3 % (ref 39.0–52.0)
Hemoglobin: 13.3 g/dL (ref 13.0–17.0)
Immature Granulocytes: 0 %
Lymphocytes Relative: 25 %
Lymphs Abs: 2 10*3/uL (ref 0.7–4.0)
MCH: 31.7 pg (ref 26.0–34.0)
MCHC: 33 g/dL (ref 30.0–36.0)
MCV: 96.2 fL (ref 80.0–100.0)
Monocytes Absolute: 0.8 10*3/uL (ref 0.1–1.0)
Monocytes Relative: 10 %
Neutro Abs: 4.7 10*3/uL (ref 1.7–7.7)
Neutrophils Relative %: 61 %
Platelets: 173 10*3/uL (ref 150–400)
RBC: 4.19 MIL/uL — ABNORMAL LOW (ref 4.22–5.81)
RDW: 15.4 % (ref 11.5–15.5)
WBC: 7.9 10*3/uL (ref 4.0–10.5)
nRBC: 0 % (ref 0.0–0.2)

## 2023-04-23 LAB — PROTIME-INR
INR: 1.6 — ABNORMAL HIGH (ref 0.8–1.2)
Prothrombin Time: 19.1 s — ABNORMAL HIGH (ref 11.4–15.2)

## 2023-04-23 LAB — LACTIC ACID, PLASMA: Lactic Acid, Venous: 1.3 mmol/L (ref 0.5–1.9)

## 2023-04-23 LAB — MAGNESIUM: Magnesium: 1.7 mg/dL (ref 1.7–2.4)

## 2023-04-23 LAB — BRAIN NATRIURETIC PEPTIDE: B Natriuretic Peptide: 253 pg/mL — ABNORMAL HIGH (ref 0.0–100.0)

## 2023-04-23 LAB — CBG MONITORING, ED
Glucose-Capillary: 103 mg/dL — ABNORMAL HIGH (ref 70–99)
Glucose-Capillary: 61 mg/dL — ABNORMAL LOW (ref 70–99)

## 2023-04-23 LAB — CK: Total CK: 332 U/L (ref 49–397)

## 2023-04-23 MED ORDER — LACTATED RINGERS IV BOLUS
1000.0000 mL | Freq: Once | INTRAVENOUS | Status: AC
  Administered 2023-04-23: 1000 mL via INTRAVENOUS

## 2023-04-23 MED ORDER — SODIUM CHLORIDE 0.9 % IV BOLUS
1000.0000 mL | Freq: Once | INTRAVENOUS | Status: AC
  Administered 2023-04-23: 1000 mL via INTRAVENOUS

## 2023-04-23 MED ORDER — ACETAMINOPHEN 650 MG RE SUPP: 650.0000 mg | Freq: Four times a day (QID) | RECTAL | Status: DC | PRN

## 2023-04-23 MED ORDER — APIXABAN 5 MG PO TABS
5.0000 mg | ORAL_TABLET | Freq: Two times a day (BID) | ORAL | Status: DC
  Administered 2023-04-24 – 2023-04-25 (×4): 5 mg via ORAL
  Filled 2023-04-23 (×4): qty 1

## 2023-04-23 MED ORDER — ENSURE ENLIVE PO LIQD
237.0000 mL | Freq: Two times a day (BID) | ORAL | Status: DC
  Administered 2023-04-25: 237 mL via ORAL
  Filled 2023-04-23 (×6): qty 237

## 2023-04-23 MED ORDER — EMPAGLIFLOZIN 10 MG PO TABS
10.0000 mg | ORAL_TABLET | Freq: Every day | ORAL | Status: DC
  Filled 2023-04-23 (×3): qty 1

## 2023-04-23 MED ORDER — LACTATED RINGERS IV SOLN: INTRAVENOUS | Status: AC

## 2023-04-23 MED ORDER — ROSUVASTATIN CALCIUM 20 MG PO TABS
40.0000 mg | ORAL_TABLET | Freq: Every day | ORAL | Status: DC
  Administered 2023-04-24 (×2): 40 mg via ORAL
  Filled 2023-04-23 (×2): qty 2

## 2023-04-23 MED ORDER — PROCHLORPERAZINE EDISYLATE 10 MG/2ML IJ SOLN: 10.0000 mg | Freq: Four times a day (QID) | INTRAMUSCULAR | Status: DC | PRN

## 2023-04-23 MED ORDER — MAGNESIUM SULFATE 2 GM/50ML IV SOLN
2.0000 g | Freq: Once | INTRAVENOUS | Status: AC
  Administered 2023-04-24: 2 g via INTRAVENOUS
  Filled 2023-04-23: qty 50

## 2023-04-23 MED ORDER — PANTOPRAZOLE SODIUM 40 MG PO TBEC
40.0000 mg | DELAYED_RELEASE_TABLET | Freq: Every day | ORAL | Status: DC
  Administered 2023-04-24 – 2023-04-25 (×2): 40 mg via ORAL
  Filled 2023-04-23 (×2): qty 1

## 2023-04-23 MED ORDER — ACETAMINOPHEN 325 MG PO TABS
650.0000 mg | ORAL_TABLET | Freq: Four times a day (QID) | ORAL | Status: DC | PRN
  Administered 2023-04-25: 650 mg via ORAL
  Filled 2023-04-23: qty 2

## 2023-04-23 MED ORDER — EZETIMIBE 10 MG PO TABS
10.0000 mg | ORAL_TABLET | Freq: Every day | ORAL | Status: DC
Start: 2023-04-24 — End: 2023-04-25
  Administered 2023-04-24 – 2023-04-25 (×2): 10 mg via ORAL
  Filled 2023-04-23 (×2): qty 1

## 2023-04-23 NOTE — ED Provider Notes (Signed)
  EMERGENCY DEPARTMENT AT [facility] Provider Note   CSN: [id number] Arrival date & time: 04/23/23  1709     History  Chief Complaint  Patient presents with   Altered Mental Status    Douglas [name] is a 68 y.o. male.  HPI     68 year old male with history of ischemic cardiomyopathy EF of 25%, CAD status post PCI and CABG, V. tach status post ICD placement, A-fib on anticoagulation comes in with chief complaint of fall and weakness.  Patient indicates that he fell in the bathroom this morning because he lost his balance.  Subsequently he was unable to get up.  He lives in a mobile home, and dragged himself from 1 into the another.  Social work came to check on him, found him on the floor and called EMS.  Patient does not think he lost consciousness.  He denies any chest pain, shortness of breath.  He is having pain over his left shoulder, left lower back, the ribs because of the fall.  He also denies any recent flulike symptoms.  Normally patient is able to ambulate in his house.  Patient found to be hypotensive at arrival.  He denies any vomiting or diarrhea.  Home Medications Prior to Admission medications   Medication Sig Start Date End Date Taking? Authorizing Provider  albuterol (PROVENTIL HFA;VENTOLIN HFA) 108 (90 Base) MCG/ACT inhaler Inhale 2 puffs into the lungs every 6 (six) hours as needed for wheezing or shortness of breath.  04/22/17   [provider]  ALPRAZolam Prudy Feeler) 1 MG tablet Take 1-2 mg by mouth 2 (two) times daily as needed for anxiety. 06/27/21   [provider]  Carboxymethylcellulose Sodium (ARTIFICIAL TEARS OP) Place 1 drop into the right eye 3 (three) times daily.    [provider]  citalopram (CELEXA) 20 MG tablet Take 20 mg by mouth daily. 04/05/17   [provider]  ELIQUIS 5 MG TABS tablet TAKE ONE TABLET BY MOUTH TWICE DAILY Patient taking differently: Take 5 mg by mouth 2 (two) times daily.  08/28/18   [name], MD  ENTRESTO 97-103 MG Take 1 tablet by mouth 2 (two) times daily. 08/30/21   [provider]  ezetimibe (ZETIA) 10 MG tablet Take 10 mg by mouth daily. 06/01/21   [provider]  fluticasone (FLONASE) 50 MCG/ACT nasal spray Place 1 spray into both nostrils daily.    [provider]  Fluticasone-Salmeterol (ADVAIR) 250-50 MCG/DOSE AEPB Inhale 1 puff into the lungs 2 (two) times daily.    [provider]  JARDIANCE 10 MG TABS tablet Take 10 mg by mouth daily. 06/27/21   [provider]  lansoprazole (PREVACID) 30 MG capsule Take 30 mg by mouth daily.      [provider]  lisinopril (ZESTRIL) 2.5 MG tablet Take 1 tablet (2.5 mg total) by mouth daily. Please make overdue appt with Dr. [name] before anymore refills. 1st attempt Patient not taking: Reported on 09/07/2021 10/15/19   Marinus Maw, MD  loratadine (CLARITIN) 10 MG tablet Take 10 mg by mouth at bedtime.    [provider]  metoprolol succinate (TOPROL-XL) 100 MG 24 hr tablet TAKE ONE (1) TABLET EACH DAY 10/15/19   [name], MD  Multiple Vitamin (MULTIVITAMIN WITH MINERALS) TABS Take 1 tablet by mouth daily.    [provider]  Omega-3 Fatty Acids (FISH OIL) 1200 MG CAPS Take 1,200 mg by mouth daily.    [provider]  oxyCODONE-acetaminophen (PERCOCET) 10-325 MG per tablet Take 1 tablet by mouth every 6 (six) hours as needed for pain.     [provider]  polyethylene glycol (MIRALAX / GLYCOLAX) packet Take 17 g by mouth daily as needed for mild constipation. 06/10/17   Danford, Earl Lites, MD  potassium chloride SA (K-DUR,KLOR-CON) 20 MEQ tablet Take 1 tablet (20 mEq total) by mouth daily. Patient not taking: Reported on 09/07/2021 08/19/17   [name], MD  promethazine (PHENERGAN) 25 MG tablet Take 12.5-25 mg by mouth every 8 (eight) hours as needed for nausea or vomiting.  Patient not taking: Reported on 09/07/2021  01/21/17   [provider]  rosuvastatin (CRESTOR) 40 MG tablet TAKE ONE (1) TABLET EACH DAY 10/18/20   [name], MD  spironolactone (ALDACTONE) 25 MG tablet TAKE ONE (1) TABLET EACH DAY 06/07/20   [name], MD  tamsulosin (FLOMAX) 0.4 MG CAPS capsule Take 1 capsule (0.4 mg total) by mouth daily. 06/11/17   Danford, Earl Lites, MD  tiZANidine (ZANAFLEX) 4 MG tablet Take 4 mg by mouth 2 (two) times daily.  09/29/11   [provider]  torsemide (DEMADEX) 20 MG tablet TAKE ONE (1) TABLET EACH DAY 10/15/19   [name], MD      Allergies    Codeine, Lasix [furosemide], Xarelto [rivaroxaban], and Vioxx [rofecoxib]    Review of Systems   Review of Systems  All other systems reviewed and are negative.   Physical Exam Updated Vital Signs BP (!) 83/59   Pulse 77   Temp 98.1 F (36.7 C) (Oral)   Resp 20   Ht 5\' 9"  (1.753 m)   Wt 110 kg   SpO2 96%   BMI 35.81 kg/m  Physical Exam Vitals and nursing note reviewed.  Constitutional:      Appearance: He is well-developed.  HENT:     Head: Atraumatic.  Eyes:     Comments: Left eye is tracking without any difficulty.  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  Visual fields are intact.  Cardiovascular:     Rate and Rhythm: Normal rate.  Pulmonary:     Effort: Pulmonary effort is normal.     Breath sounds: No rales.  Abdominal:     Tenderness: There is no abdominal tenderness.  Musculoskeletal:     Cervical back: Neck supple.     Comments: Left shoulder tenderness, right rib tenderness  Skin:    General: Skin is warm.     Findings: Bruising present.  Neurological:     Mental Status: He is alert and oriented to person, place, and time.     Cranial Nerves: No cranial nerve deficit.     Sensory: No sensory deficit.     Motor: No weakness.     Coordination: Coordination normal.     ED Results / Procedures / Treatments   Labs (all labs ordered are listed, but only abnormal results are displayed) Labs Reviewed   CBC WITH DIFFERENTIAL/PLATELET - Abnormal; Notable for the following components:      Result Value   RBC 4.19 (*)    All other components within normal limits  PROTIME-INR - Abnormal; Notable for the following components:   Prothrombin Time 19.1 (*)    INR 1.6 (*)    All other components within normal limits  COMPREHENSIVE METABOLIC PANEL - Abnormal; Notable for the following components:   Sodium 134 (*)    BUN 44 (*)    Creatinine, Ser 2.36 (*)    Calcium  8.2 (*)    Albumin 3.0 (*)    GFR, Estimated 29 (*)    All other components within normal limits  CBG MONITORING, ED - Abnormal; Notable for the following components:   Glucose-Capillary 103 (*)    All other components within normal limits  RESP PANEL BY RT-PCR (RSV, FLU A&B, COVID)  RVPGX2  CK  URINALYSIS, W/ REFLEX TO CULTURE (INFECTION SUSPECTED)  RAPID URINE DRUG SCREEN, HOSP PERFORMED  MAGNESIUM  BRAIN NATRIURETIC PEPTIDE    EKG EKG Interpretation Date/Time:  Tuesday April 23 2023 19:46:15 EST Ventricular Rate:  71 PR Interval:    QRS Duration:  105 QT Interval:  610 QTC Calculation: 682 R Axis:   98  Text Interpretation: Atrial fibrillation Aberrant complex Right axis deviation Borderline T abnormalities, diffuse leads Prolonged QT interval Confirmed by Derwood Kaplan ([id number]) on 04/23/2023 8:38:55 PM  Radiology DG Shoulder Left Result Date: 04/23/2023 CLINICAL DATA:  Fall EXAM: LEFT SHOULDER - 2+ VIEW COMPARISON:  None Available. FINDINGS: Advanced degenerative changes in the left [facility] joint. Mild degenerative changes in the glenohumeral joint. No acute bony abnormality. Specifically, no fracture, subluxation, or dislocation. IMPRESSION: Degenerative changes.  No acute bony abnormality. Electronically Signed   By: Charlett Nose M.D.   On: 04/23/2023 20:09   DG Lumbar Spine Complete Result Date: 04/23/2023 CLINICAL DATA:  Fall EXAM: LUMBAR SPINE - COMPLETE 4+ VIEW COMPARISON:  CT 06/02/2017 FINDINGS: Mild  compression fractures involving the L1 and L2 vertebral bodies. Transverse fractures were noted through the superior vertebral bodies in 2019. This is compatible with a chronic process. No acute fractures or malalignment. Degenerative disc disease with disc space narrowing and spurring. Diffuse degenerative facet disease. IMPRESSION: Degenerative changes and remote compression fractures involving L1 and L2. No acute bony abnormality. Electronically Signed   By: Charlett Nose M.D.   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 Prior CABG. Cardiomegaly. No confluent opacities or effusions. No acute bony abnormality. IMPRESSION: Cardiomegaly.  No active disease. Electronically Signed   By: Charlett Nose M.D.   On: 04/23/2023 20:06   CT Head Wo Contrast Result Date: 04/23/2023 CLINICAL DATA:  Mental status change of unknown cause. Multiple falls at home. Neck trauma. EXAM: CT HEAD WITHOUT CONTRAST CT CERVICAL SPINE WITHOUT CONTRAST TECHNIQUE: Multidetector CT imaging of the head and cervical spine was performed following the standard protocol without intravenous contrast. Multiplanar CT image reconstructions of the cervical spine were also generated. RADIATION DOSE REDUCTION: This exam was performed according to the departmental dose-optimization program which includes automated exposure control, adjustment of the mA and/or kV according to patient size and/or use of iterative reconstruction technique. COMPARISON:  CT head 06/26/2022.  CT cervical spine 08/24/2013 FINDINGS: CT HEAD FINDINGS Brain: No evidence of acute infarction, hemorrhage, hydrocephalus, extra-axial collection or mass lesion/mass effect. Mild cerebral atrophy. Vascular: No hyperdense vessel or unexpected calcification. Skull: Normal. Negative for fracture or focal lesion. Sinuses/Orbits: Paranasal sinuses and mastoid  air cells are clear. Right phthisis bulbi. Other: Small subcutaneous scalp hematoma over the left anterior frontal region. CT CERVICAL SPINE FINDINGS Alignment: Straightening of the usual cervical lordosis with slight anterior subluxation at C4 on C5. Appearances are similar to prior study and are likely positional/degenerative. Normal alignment of the posterior elements. Skull base and vertebrae: Skull base appears intact. No vertebral compression deformities. No focal bone lesion or  bone destruction. Soft tissues and spinal canal: No prevertebral soft tissue swelling. No abnormal paraspinal soft tissue mass or infiltration. Disc levels: Diffuse degenerative changes with disc space narrowing and endplate osteophyte formation throughout. Degenerative changes in the facet joints. Disc osteophyte complex demonstrates anterior effacement of the thecal sac at C5-6 and C6-7 levels. Upper chest: Lung apices are clear. Other: None. IMPRESSION: 1. No acute intracranial abnormalities. Small subcutaneous scalp hematoma over the left anterior frontal region. 2. Alignment of the cervical spine is unchanged since prior study with straightening of usual cervical lordosis and slight anterior subluxation at C4-5. This is likely degenerative/positional. No acute displaced fractures are identified. Electronically Signed   By: Burman Nieves M.D.   On: 04/23/2023 19:37   CT Cervical Spine Wo Contrast Result Date: 04/23/2023 CLINICAL DATA:  Mental status change of unknown cause. Multiple falls at home. Neck trauma. EXAM: CT HEAD WITHOUT CONTRAST CT CERVICAL SPINE WITHOUT CONTRAST TECHNIQUE: Multidetector CT imaging of the head and cervical spine was performed following the standard protocol without intravenous contrast. Multiplanar CT image reconstructions of the cervical spine were also generated. RADIATION DOSE REDUCTION: This exam was performed according to the departmental dose-optimization program which includes automated  exposure control, adjustment of the mA and/or kV according to patient size and/or use of iterative reconstruction technique. COMPARISON:  CT head 06/26/2022.  CT cervical spine 08/24/2013 FINDINGS: CT HEAD FINDINGS Brain: No evidence of acute infarction, hemorrhage, hydrocephalus, extra-axial collection or mass lesion/mass effect. Mild cerebral atrophy. Vascular: No hyperdense vessel or unexpected calcification. Skull: Normal. Negative for fracture or focal lesion. Sinuses/Orbits: Paranasal sinuses and mastoid air cells are clear. Right phthisis bulbi. Other: Small subcutaneous scalp hematoma over the left anterior frontal region. CT CERVICAL SPINE FINDINGS Alignment: Straightening of the usual cervical lordosis with slight anterior subluxation at C4 on C5. Appearances are similar to prior study and are likely positional/degenerative. Normal alignment of the posterior elements. Skull base and vertebrae: Skull base appears intact. No vertebral compression deformities. No focal bone lesion or bone destruction. Soft tissues and spinal canal: No prevertebral soft tissue swelling. No abnormal paraspinal soft tissue mass or infiltration. Disc levels: Diffuse degenerative changes with disc space narrowing and endplate osteophyte formation throughout. Degenerative changes in the facet joints. Disc osteophyte complex demonstrates anterior effacement of the thecal sac at C5-6 and C6-7 levels. Upper chest: Lung apices are clear. Other: None. IMPRESSION: 1. No acute intracranial abnormalities. Small subcutaneous scalp hematoma over the left anterior frontal region. 2. Alignment of the cervical spine is unchanged since prior study with straightening of usual cervical lordosis and slight anterior subluxation at C4-5. This is likely degenerative/positional. No acute displaced fractures are identified. Electronically Signed   By: Burman Nieves M.D.   On: 04/23/2023 19:37    Procedures .Critical Care  Performed by:  Derwood Kaplan, MD Authorized by: Derwood Kaplan, MD   Critical care provider statement:    Critical care time (minutes):  48   Critical care was necessary to treat or prevent imminent or life-threatening deterioration of the following conditions:  Circulatory failure and renal failure (Hypotension and renal failure)   Critical care was time spent personally by me on the following activities:  Development of treatment plan with patient or surrogate, discussions with consultants, evaluation of patient's response to treatment, examination of patient, ordering and review of laboratory studies, ordering and review of radiographic studies, ordering and performing treatments and interventions, pulse oximetry, re-evaluation of patient's condition and review of old charts  Medications Ordered in ED Medications  sodium chloride 0.9 % bolus 1,000 mL (has no administration in time range)  lactated ringers infusion (has no administration in time range)  lactated ringers bolus 1,000 mL (0 mLs Intravenous Stopped 04/23/23 2003)    ED Course/ Medical Decision Making/ A&P                                 Medical Decision Making Amount and/or Complexity of Data Reviewed Labs: ordered. Radiology: ordered.  Risk Prescription drug management.   68 year old patient with pertinent past medical history of CAD, CHF, A-fib on Eliquis, VT status post AICD placement comes in with chief complaint of acute altered mental status /generalized weakness.  Collateral history provided by EMS.  They found patient to be hypotensive when called to his home by DSS.  I have reviewed patient's previous records including the medications.  I have also reviewed patient's care everywhere records and it appears that he has a [employer] device and routinely follows [facility].  Differential diagnosis considered for this patient includes: ICH / Stroke, Infection - UTI/Pneumonia/soft tissue infection leading to  encephalopathy, encephalopathy due to electrolyte abnormality or drug interactions or toxins or metabolic conditions like adrenal insufficiency, hyperglycemia, paraneoplastic process, new renal failure/cardiorenal process, flu, rhabdomyolysis, arrhythmia leading to syncope.  Patient noted to be hypotensive.  But history is not indicative of any specific source of infection.  Based on initial assessment, higher suspicion for electrolyte abnormality, polypharmacy/medication toxicity, flu and severe dehydration.   Plan is to get basic labs and radiographs to rule out concerning trauma.  Reassessment: I have independently reviewed the following labs: Patient is noted to have AKI.  Hemoglobin is at baseline.  Patient's potassium is normal.  He also has normal white count.  I have independently reviewed the following imaging: CT scan of the brain, which reveals no evidence of brain bleed.  I have also seen x-ray which shows no pneumothorax.  Patient was also reassessed at 8:30 PM.  His BP had improved with the initial fluid bolus, but has started dropping again.  He also has AKI.  We will give him another bolus and start him on maintenance.  Plan is to admit the patient. My initial neuroexam is reassuring, however patient likely will need to be ambulated to ensure that he does not have stroke that led to the fall.  Final Clinical Impression(s) / ED Diagnoses Final diagnoses:  AKI (acute kidney injury) (HCC)  Hypotension, unspecified hypotension type    Rx / DC Orders ED Discharge Orders     None         Derwood Kaplan, MD 04/23/23 2049

## 2023-04-23 NOTE — H&P (Signed)
 History and Physical    Patient: Douglas [name] EAV:[id number] DOB: 1956/01/17 DOA: 04/23/2023 DOS: the patient was seen and examined on 04/23/2023 PCP: Mattie Marlin, DO  Patient coming from: Home  Chief Complaint:  Chief Complaint  Patient presents with   Altered Mental Status   HPI: Douglas [name] is a 68 y.o. male with medical history significant of chronic systolic CHF (LVEF on 8/23-20 5 to 30% -Care Everywhere), CAD, asthma, anxiety who presented to the emergency department due to fall sustained at home.  Patient fell in the bathroom this morning due to loss of balance and was unable to get up.  He lives in a mobile home and drives himself from 1 room to another, Child psychotherapist checked on him today and found him on the floor, so EMS was activated and he was sent to the ED for further evaluation and management. Patient endorsed pain over left shoulder, left lower back ribs due to before, but denies chest pain, shortness of breath, loss of consciousness and flulike symptoms.  He was hypotensive on arrival to the ED.  ED Course:  In the emergency department, BP was 80/52 on arrival to the ED, other vital signs were within normal range.  Workup in the ED showed normal CBC, BMP showed normal BMP except sodium of 134, BUN 44, creatinine 2.36 (baseline creatinine at 1.0), albumin 3.0, BNP 253 lactic acid was 1.3, total CK3 32, magnesium 1.7.  Influenza A, B, SARS coronavirus 2, RSV was negative CT head without contrast showed no acute intracranial abnormalities.  Small subcutaneous Hematoma over the left anterior frontal region. CT cervical spine without contrast showed no acute displaced fractures Lumbar spine x-ray showed degenerative changes and remote compression fractures involving L1 and L2 Left shoulder x-ray showed no acute bony abnormality Chest x-ray showed cardiomegaly.  No acute disease IV hydration was provided.  Hospitalist was asked admit patient for further evaluation and  management.  Review of Systems: Review of systems as noted in the HPI. All other systems reviewed and are negative.   Past Medical History:  Diagnosis Date   A-fib [facility])    AICD (automatic cardioverter/defibrillator) present    Anemia    Asthma    CAD (coronary artery disease)    CHF (congestive heart failure) (HCC)    Diabetes mellitus without complication (HCC)    Dyslipidemia    Elevated cholesterol    Hypertension    Past Surgical History:  Procedure Laterality Date   CARDIAC CATHETERIZATION  1996   stent placement   CARDIAC DEFIBRILLATOR PLACEMENT     CORONARY ARTERY BYPASS GRAFT  2002   times 4   CORONARY ARTERY BYPASS GRAFT     EYE SURGERY     ICD GENERATOR CHANGEOUT N/A 09/30/2018   Procedure: ICD GENERATOR CHANGEOUT;  Surgeon: Marinus Maw, MD;  Location: [facility] INVASIVE CV LAB;  Service: Cardiovascular;  Laterality: N/A;   IMPLANTABLE CARDIOVERTER DEFIBRILLATOR (ICD) GENERATOR CHANGE N/A 09/10/2011   Procedure: ICD GENERATOR CHANGE;  Surgeon: Marinus Maw, MD;  Location[facility] C[facility] LAB;  Service: Cardiovascular;  Laterality: N/A;   INSERT / REPLACE / REMOVE PACEMAKER     LEFT HEART CATHETERIZATION WITH CORONARY ANGIOGRAM N/A 08/29/2011   Procedure: LEFT HEART CATHETERIZATION WITH CORONARY ANGIOGRAM;  Surgeon: Peter M [place], MD;  Location: [facility] CATH LAB;  Service: Cardiovascular;  Laterality: N/A;    Social History:  reports that he has never smoked. He has never used smokeless tobacco. He reports that he  does not drink alcohol and does not use drugs.   Allergies  Allergen Reactions   Codeine Hives and Itching   Lasix [Furosemide] Other (See Comments)    Patient says he gets light headed and dizzy.    Xarelto [Rivaroxaban] Other (See Comments)    Bleeding ulcers   Vioxx [Rofecoxib] Palpitations and Other (See Comments)    Caused massive heart attack    Family History  Problem Relation Age of Onset   Heart attack Father      Prior to Admission medications    Medication Sig Start Date End Date Taking? Authorizing Provider  ALPRAZolam Prudy Feeler) 1 MG tablet Take 1-2 mg by mouth 2 (two) times daily as needed for anxiety. 06/27/21  Yes [provider]  Carboxymethylcellulose Sodium (ARTIFICIAL TEARS OP) Place 1 drop into the right eye 3 (three) times daily.   Yes [provider]  citalopram (CELEXA) 20 MG tablet Take 20 mg by mouth daily. 04/05/17  Yes [provider]  ELIQUIS 5 MG TABS tablet TAKE ONE TABLET BY MOUTH TWICE DAILY Patient taking differently: Take 5 mg by mouth 2 (two) times daily. 08/28/18  Yes Hochrein, Fayrene Fearing, MD  ENTRESTO 97-103 MG Take 1 tablet by mouth 2 (two) times daily. 08/30/21  Yes [provider]  ezetimibe (ZETIA) 10 MG tablet Take 10 mg by mouth daily. 06/01/21  Yes [provider]  fluticasone (FLONASE) 50 MCG/ACT nasal spray Place 1 spray into both nostrils daily.   Yes [provider]  JARDIANCE 10 MG TABS tablet Take 10 mg by mouth daily. 06/27/21  Yes [provider]  lansoprazole (PREVACID) 30 MG capsule Take 30 mg by mouth daily.     Yes [provider]  loratadine (CLARITIN) 10 MG tablet Take 10 mg by mouth daily.   Yes [provider]  metoprolol succinate (TOPROL-XL) 100 MG 24 hr tablet TAKE ONE (1) TABLET EACH DAY 10/15/19  Yes [name], MD  Multiple Vitamin (MULTIVITAMIN WITH MINERALS) TABS Take 1 tablet by mouth daily. And balance of nature   Yes [provider]  Omega-3 Fatty Acids (FISH OIL) 1200 MG CAPS Take 1,200 mg by mouth daily.   Yes [provider]  oxyCODONE-acetaminophen (PERCOCET) 10-325 MG per tablet Take 1 tablet by mouth every 6 (six) hours as needed for pain.    Yes [provider]  rosuvastatin (CRESTOR) 40 MG tablet TAKE ONE (1) TABLET EACH DAY 10/18/20  Yes [name], MD  spironolactone (ALDACTONE) 25 MG tablet TAKE ONE (1) TABLET EACH DAY 06/07/20  Yes [name], MD  tiZANidine  (ZANAFLEX) 4 MG tablet Take 4 mg by mouth every 8 (eight) hours as needed for muscle spasms. 09/29/11  Yes [provider]  torsemide (DEMADEX) 20 MG tablet TAKE ONE (1) TABLET EACH DAY 10/15/19  Yes [name], MD    Physical Exam: BP 90/67   Pulse 66   Temp 98.1 F (36.7 C) (Oral)   Resp 20   Ht 5\' 9"  (1.753 m)   Wt 110 kg   SpO2 98%   BMI 35.81 kg/m   General: 68 y.o. year-old male well developed well nourished in no acute distress.  Alert and oriented x3. HEENT: NCAT, right eye blindness.  Scalp hematoma in left frontal area. Neck: Supple, trachea medial Cardiovascular: Regular rate and rhythm with no rubs or gallops.  No thyromegaly or JVD noted.  No lower extremity edema. 2/4 pulses in all 4 extremities. Respiratory: Clear to auscultation with  no wheezes or rales. Good inspiratory effort. Abdomen: Soft, nontender nondistended with normal bowel sounds x4 quadrants. Muskuloskeletal: No cyanosis, clubbing or edema noted bilaterally Neuro: CN II-XII intact, strength 5/5 x 4, sensation, reflexes intact Skin: No ulcerative lesions noted or rashes Psychiatry: Judgement and insight appear normal. Mood is appropriate for condition and setting          Labs on Admission:  Basic Metabolic Panel: Recent Labs  Lab 04/23/23 2001  NA 134*  K 3.8  CL 98  CO2 24  GLUCOSE 98  BUN 44*  CREATININE 2.36*  CALCIUM 8.2*  MG 1.7   Liver Function Tests: Recent Labs  Lab 04/23/23 2001  AST 40  ALT 37  ALKPHOS 43  BILITOT 0.7  PROT 6.5  ALBUMIN 3.0*   No results for input(s): "LIPASE", "AMYLASE" in the last 168 hours. No results for input(s): "AMMONIA" in the last 168 hours. CBC: Recent Labs  Lab 04/23/23 1840  WBC 7.9  NEUTROABS 4.7  HGB 13.3  HCT 40.3  MCV 96.2  PLT 173   Cardiac Enzymes: Recent Labs  Lab 04/23/23 2001  CKTOTAL 332    BNP (last 3 results) Recent Labs    04/23/23 1840  BNP 253.0*    ProBNP (last 3 results) No results for  input(s): "PROBNP" in the last 8760 hours.  CBG: Recent Labs  Lab 04/23/23 1950 04/23/23 2145  GLUCAP 103* 61*    Radiological Exams on Admission: DG Shoulder Left Result Date: 04/23/2023 CLINICAL DATA:  Fall EXAM: LEFT SHOULDER - 2+ VIEW COMPARISON:  None Available. FINDINGS: Advanced degenerative changes in the left [facility] joint. Mild degenerative changes in the glenohumeral joint. No acute bony abnormality. Specifically, no fracture, subluxation, or dislocation. IMPRESSION: Degenerative changes.  No acute bony abnormality. Electronically Signed   By: Charlett Nose M.D.   On: 04/23/2023 20:09   DG Lumbar Spine Complete Result Date: 04/23/2023 CLINICAL DATA:  Fall EXAM: LUMBAR SPINE - COMPLETE 4+ VIEW COMPARISON:  CT 06/02/2017 FINDINGS: Mild compression fractures involving the L1 and L2 vertebral bodies. Transverse fractures were noted through the superior vertebral bodies in 2019. This is compatible with a chronic process. No acute fractures or malalignment. Degenerative disc disease with disc space narrowing and spurring. Diffuse degenerative facet disease. IMPRESSION: Degenerative changes and remote compression fractures involving L1 and L2. No acute bony abnormality. Electronically Signed   By: Charlett Nose M.D.   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 Prior CABG. Cardiomegaly. No confluent opacities or effusions. No acute bony abnormality. IMPRESSION: Cardiomegaly.  No active disease. Electronically Signed   By: Charlett Nose M.D.   On: 04/23/2023 20:06   CT Head Wo Contrast Result Date: 04/23/2023 CLINICAL DATA:  Mental status change of unknown cause. Multiple falls at home. Neck trauma. EXAM: CT HEAD WITHOUT CONTRAST CT CERVICAL SPINE WITHOUT CONTRAST TECHNIQUE: Multidetector CT imaging of the head and cervical spine was performed following the standard  protocol without intravenous contrast. Multiplanar CT image reconstructions of the cervical spine were also generated. RADIATION DOSE REDUCTION: This exam was performed according to the departmental dose-optimization program which includes automated exposure control, adjustment of the mA and/or kV according to patient size and/or use of iterative reconstruction technique. COMPARISON:  CT head 06/26/2022.  CT cervical spine 08/24/2013 FINDINGS: CT HEAD FINDINGS Brain: No evidence of acute infarction, hemorrhage, hydrocephalus,  extra-axial collection or mass lesion/mass effect. Mild cerebral atrophy. Vascular: No hyperdense vessel or unexpected calcification. Skull: Normal. Negative for fracture or focal lesion. Sinuses/Orbits: Paranasal sinuses and mastoid air cells are clear. Right phthisis bulbi. Other: Small subcutaneous scalp hematoma over the left anterior frontal region. CT CERVICAL SPINE FINDINGS Alignment: Straightening of the usual cervical lordosis with slight anterior subluxation at C4 on C5. Appearances are similar to prior study and are likely positional/degenerative. Normal alignment of the posterior elements. Skull base and vertebrae: Skull base appears intact. No vertebral compression deformities. No focal bone lesion or bone destruction. Soft tissues and spinal canal: No prevertebral soft tissue swelling. No abnormal paraspinal soft tissue mass or infiltration. Disc levels: Diffuse degenerative changes with disc space narrowing and endplate osteophyte formation throughout. Degenerative changes in the facet joints. Disc osteophyte complex demonstrates anterior effacement of the thecal sac at C5-6 and C6-7 levels. Upper chest: Lung apices are clear. Other: None. IMPRESSION: 1. No acute intracranial abnormalities. Small subcutaneous scalp hematoma over the left anterior frontal region. 2. Alignment of the cervical spine is unchanged since prior study with straightening of usual cervical lordosis and  slight anterior subluxation at C4-5. This is likely degenerative/positional. No acute displaced fractures are identified. Electronically Signed   By: Burman Nieves M.D.   On: 04/23/2023 19:37   CT Cervical Spine Wo Contrast Result Date: 04/23/2023 CLINICAL DATA:  Mental status change of unknown cause. Multiple falls at home. Neck trauma. EXAM: CT HEAD WITHOUT CONTRAST CT CERVICAL SPINE WITHOUT CONTRAST TECHNIQUE: Multidetector CT imaging of the head and cervical spine was performed following the standard protocol without intravenous contrast. Multiplanar CT image reconstructions of the cervical spine were also generated. RADIATION DOSE REDUCTION: This exam was performed according to the departmental dose-optimization program which includes automated exposure control, adjustment of the mA and/or kV according to patient size and/or use of iterative reconstruction technique. COMPARISON:  CT head 06/26/2022.  CT cervical spine 08/24/2013 FINDINGS: CT HEAD FINDINGS Brain: No evidence of acute infarction, hemorrhage, hydrocephalus, extra-axial collection or mass lesion/mass effect. Mild cerebral atrophy. Vascular: No hyperdense vessel or unexpected calcification. Skull: Normal. Negative for fracture or focal lesion. Sinuses/Orbits: Paranasal sinuses and mastoid air cells are clear. Right phthisis bulbi. Other: Small subcutaneous scalp hematoma over the left anterior frontal region. CT CERVICAL SPINE FINDINGS Alignment: Straightening of the usual cervical lordosis with slight anterior subluxation at C4 on C5. Appearances are similar to prior study and are likely positional/degenerative. Normal alignment of the posterior elements. Skull base and vertebrae: Skull base appears intact. No vertebral compression deformities. No focal bone lesion or bone destruction. Soft tissues and spinal canal: No prevertebral soft tissue swelling. No abnormal paraspinal soft tissue mass or infiltration. Disc levels: Diffuse degenerative  changes with disc space narrowing and endplate osteophyte formation throughout. Degenerative changes in the facet joints. Disc osteophyte complex demonstrates anterior effacement of the thecal sac at C5-6 and C6-7 levels. Upper chest: Lung apices are clear. Other: None. IMPRESSION: 1. No acute intracranial abnormalities. Small subcutaneous scalp hematoma over the left anterior frontal region. 2. Alignment of the cervical spine is unchanged since prior study with straightening of usual cervical lordosis and slight anterior subluxation at C4-5. This is likely degenerative/positional. No acute displaced fractures are identified. Electronically Signed   By: Burman Nieves M.D.   On: 04/23/2023 19:37    EKG: I independently viewed the EKG done and my findings are as followed: Atrial fibrillation with rate control with prolonged QTc of 682 ms  Assessment/Plan Present on Admission:  Atrial fibrillation, chronic (HCC)  Gastroesophageal reflux disease without esophagitis  Principal Problem:   Fall at home Active Problems:   Atrial fibrillation, chronic (HCC)   Gastroesophageal reflux disease without esophagitis   Generalized weakness   Prolonged QT interval   AKI (acute kidney injury) (HCC)   Scalp hematoma   Hypoalbuminemia due to protein-calorie malnutrition (HCC)   Chronic systolic CHF (congestive heart failure) (HCC)   Anxiety  Fall at home Generalized weakness in the setting of above Continue fall precaution Continue PT/OT eval and treat  Prolonged QT interval Avoid QT prolonging drugs Magnesium was 1.7, this was replenished to optimize a goal of 2.0 Repeat EKG in the morning  Acute kidney injury BUN/creatinine 44/2.36 (baseline creatinine at 1.0) Continue gentle hydration Renally adjust medications, avoid nephrotoxic agents/dehydration/hypotension  Scalp hematoma Stable, continue to monitor  Chronic atrial fibrillation Elevated BNP This is chronic (this was 1043 a year  ago) Patient is stable.  Continue to monitor  Hypoalbuminemia possibly secondary to moderate protein calorie malnutrition Albumin 3.0, protein supplement will be provided  Chronic systolic CHF Continue Jardiance Toprol-XL, Aldactone, Entresto temporarily held at this time due to soft BP  GERD Continue Protonix  Chronic atrial fibrillation Continue Eliquis Toprol temporarily held due to soft BP  Mixed hyperlipidemia Continue Crestor, Zetia  Anxiety Continue Xanax per home regimen   DVT prophylaxis: Eliquis  Code Status: Full code  Family Communication: None at bedside  Consults: None  Severity of Illness: The appropriate patient status for this patient is OBSERVATION. Observation status is judged to be reasonable and necessary in order to provide the required intensity of service to ensure the patient's safety. The patient's presenting symptoms, physical exam findings, and initial radiographic and laboratory data in the context of their medical condition is felt to place them at decreased risk for further clinical deterioration. Furthermore, it is anticipated that the patient will be medically stable for discharge from the hospital within 2 midnights of admission.   Author: Frankey Shown, DO 04/23/2023 11:38 PM  For on call review [email or web address].

## 2023-04-23 NOTE — ED Notes (Signed)
Pt blind in right eye

## 2023-04-23 NOTE — ED Triage Notes (Signed)
 [employer] called ems to do welfare check. Pt has had multiple falls at home and is altered. Pt normal is unknown

## 2023-04-23 NOTE — ED Notes (Signed)
 Pt requesting food- pt given sandwich and water

## 2023-04-23 NOTE — ED Notes (Signed)
 Dr Rhunette Croft aware of pt low BP- pt is not symptomatic at this time.

## 2023-04-24 ENCOUNTER — Observation Stay (HOSPITAL_COMMUNITY): Payer: 59

## 2023-04-24 DIAGNOSIS — W19XXXD Unspecified fall, subsequent encounter: Secondary | ICD-10-CM

## 2023-04-24 DIAGNOSIS — N179 Acute kidney failure, unspecified: Secondary | ICD-10-CM | POA: Diagnosis not present

## 2023-04-24 DIAGNOSIS — R531 Weakness: Secondary | ICD-10-CM | POA: Diagnosis not present

## 2023-04-24 DIAGNOSIS — K219 Gastro-esophageal reflux disease without esophagitis: Secondary | ICD-10-CM

## 2023-04-24 DIAGNOSIS — S0003XD Contusion of scalp, subsequent encounter: Secondary | ICD-10-CM

## 2023-04-24 LAB — COMPREHENSIVE METABOLIC PANEL
ALT: 37 U/L (ref 0–44)
AST: 35 U/L (ref 15–41)
Albumin: 3 g/dL — ABNORMAL LOW (ref 3.5–5.0)
Alkaline Phosphatase: 41 U/L (ref 38–126)
Anion gap: 9 (ref 5–15)
BUN: 37 mg/dL — ABNORMAL HIGH (ref 8–23)
CO2: 25 mmol/L (ref 22–32)
Calcium: 8.3 mg/dL — ABNORMAL LOW (ref 8.9–10.3)
Chloride: 104 mmol/L (ref 98–111)
Creatinine, Ser: 1.7 mg/dL — ABNORMAL HIGH (ref 0.61–1.24)
GFR, Estimated: 44 mL/min — ABNORMAL LOW (ref >60)
Glucose, Bld: 100 mg/dL — ABNORMAL HIGH (ref 70–99)
Potassium: 3.8 mmol/L (ref 3.5–5.1)
Sodium: 138 mmol/L (ref 135–145)
Total Bilirubin: 0.7 mg/dL (ref 0.0–1.2)
Total Protein: 6.4 g/dL — ABNORMAL LOW (ref 6.5–8.1)

## 2023-04-24 LAB — CBC
HCT: 41.8 % (ref 39.0–52.0)
Hemoglobin: 13.1 g/dL (ref 13.0–17.0)
MCH: 31.1 pg (ref 26.0–34.0)
MCHC: 31.3 g/dL (ref 30.0–36.0)
MCV: 99.3 fL (ref 80.0–100.0)
Platelets: 201 10*3/uL (ref 150–400)
RBC: 4.21 MIL/uL — ABNORMAL LOW (ref 4.22–5.81)
RDW: 14.7 % (ref 11.5–15.5)
WBC: 6.6 10*3/uL (ref 4.0–10.5)
nRBC: 0 % (ref 0.0–0.2)

## 2023-04-24 LAB — PHOSPHORUS: Phosphorus: 3.5 mg/dL (ref 2.5–4.6)

## 2023-04-24 LAB — HIV ANTIBODY (ROUTINE TESTING W REFLEX): HIV Screen 4th Generation wRfx: NONREACTIVE

## 2023-04-24 LAB — MAGNESIUM: Magnesium: 2 mg/dL (ref 1.7–2.4)

## 2023-04-24 MED ORDER — SPIRONOLACTONE 25 MG PO TABS
25.0000 mg | ORAL_TABLET | Freq: Every day | ORAL | Status: DC
  Administered 2023-04-25: 25 mg via ORAL
  Filled 2023-04-24: qty 1

## 2023-04-24 MED ORDER — OXYCODONE-ACETAMINOPHEN 10-325 MG PO TABS: 1.0000 | ORAL_TABLET | Freq: Four times a day (QID) | ORAL | Status: DC | PRN

## 2023-04-24 MED ORDER — POLYVINYL ALCOHOL 1.4 % OP SOLN
1.0000 [drp] | Freq: Three times a day (TID) | OPHTHALMIC | Status: DC
  Administered 2023-04-24 – 2023-04-25 (×3): 1 [drp] via OPHTHALMIC
  Filled 2023-04-24: qty 15

## 2023-04-24 MED ORDER — TORSEMIDE 20 MG PO TABS
20.0000 mg | ORAL_TABLET | Freq: Every day | ORAL | Status: DC
  Administered 2023-04-25: 20 mg via ORAL
  Filled 2023-04-24: qty 1

## 2023-04-24 MED ORDER — LIDOCAINE 5 % EX PTCH: 1.0000 | MEDICATED_PATCH | CUTANEOUS | Status: DC

## 2023-04-24 MED ORDER — LACTATED RINGERS IV SOLN: INTRAVENOUS | Status: AC

## 2023-04-24 MED ORDER — OXYCODONE HCL 5 MG PO TABS
5.0000 mg | ORAL_TABLET | Freq: Four times a day (QID) | ORAL | Status: DC | PRN
  Administered 2023-04-24: 5 mg via ORAL
  Filled 2023-04-24: qty 1

## 2023-04-24 MED ORDER — CITALOPRAM HYDROBROMIDE 20 MG PO TABS
20.0000 mg | ORAL_TABLET | Freq: Every day | ORAL | Status: DC
  Administered 2023-04-25: 20 mg via ORAL
  Filled 2023-04-24: qty 1

## 2023-04-24 MED ORDER — ALPRAZOLAM 0.5 MG PO TABS
1.0000 mg | ORAL_TABLET | Freq: Two times a day (BID) | ORAL | Status: DC | PRN
  Administered 2023-04-24: 1 mg via ORAL
  Filled 2023-04-24: qty 2

## 2023-04-24 MED ORDER — OXYCODONE-ACETAMINOPHEN 5-325 MG PO TABS
1.0000 | ORAL_TABLET | Freq: Four times a day (QID) | ORAL | Status: DC | PRN
  Administered 2023-04-24: 1 via ORAL
  Filled 2023-04-24: qty 1

## 2023-04-24 MED ORDER — LORATADINE 10 MG PO TABS
10.0000 mg | ORAL_TABLET | Freq: Every day | ORAL | Status: DC
  Administered 2023-04-25: 10 mg via ORAL
  Filled 2023-04-24: qty 1

## 2023-04-24 NOTE — ED Notes (Signed)
 Dr Thomes Dinning changing pt to SD instead of tele d/t low BP- this nurse called to [phone] to inform christina, RN that pt will not be coming up

## 2023-04-24 NOTE — Evaluation (Signed)
 Physical Therapy Evaluation Patient Details Name: Douglas [name] MRN: [id number] DOB: 1956/01/07 Today's Date: 04/24/2023  History of Present Illness  Douglas [name] is a 68 y.o. male with medical history significant of chronic systolic CHF (LVEF on 8/23-20 5 to 30% -Care Everywhere), CAD, asthma, anxiety who presented to the emergency department due to fall sustained at home.  Patient fell in the bathroom this morning due to loss of balance and was unable to get up.  He lives in a mobile home and drives himself from 1 room to another, Child psychotherapist checked on him today and found him on the floor, so EMS was activated and he was sent to the ED for further evaluation and management.  Patient endorsed pain over left shoulder, left lower back ribs due to before, but denies chest pain, shortness of breath, loss of consciousness and flulike symptoms.  He was hypotensive on arrival to the ED.   Clinical Impression  Patient functioning near baseline for functional mobility and gait demonstrating good return for bed mobility, transferring to chair, commode in bathroom and ambulation in room/hallways without loss of  balance.  Plan:  Patient discharged from physical therapy to care of nursing for ambulation daily as tolerated for length of stay.          If plan is discharge home, recommend the following: A little help with walking and/or transfers;A little help with bathing/dressing/bathroom;Assistance with cooking/housework;Help with stairs or ramp for entrance   Can travel by private vehicle        Equipment Recommendations None recommended by PT  Recommendations for Other Services       Functional Status Assessment Patient has had a recent decline in their functional status and demonstrates the ability to make significant improvements in function in a reasonable and predictable amount of time.     Precautions / Restrictions Precautions Precautions: Fall Recall of Precautions/Restrictions:  Intact Restrictions Weight Bearing Restrictions Per Provider Order: No      Mobility  Bed Mobility Overal bed mobility: Modified Independent                  Transfers Overall transfer level: Modified independent Equipment used: None               General transfer comment: good return for transferring to chair and to/from commode in bathroom    Ambulation/Gait Ambulation/Gait assistance: Modified independent (Device/Increase time) Gait Distance (Feet): 100 Feet Assistive device: None Gait Pattern/deviations: WFL(Within Functional Limits) Gait velocity: slightly decreased     General Gait Details: slightly labored movement with good return for ambulating in room, hallways without loss of balance or need for an AD  Stairs            Wheelchair Mobility     Tilt Bed    Modified Rankin (Stroke Patients Only)       Balance Overall balance assessment: Mild deficits observed, not formally tested                                           Pertinent Vitals/Pain Pain Assessment Pain Assessment: Faces Faces Pain Scale: Hurts a little bit Pain Location: forehead at sight of bruise Pain Descriptors / Indicators: Sore Pain Intervention(s): Monitored during session    Home Living Family/patient expects to be discharged to:: Private residence Living Arrangements: Alone Available Help at Discharge: Friend(s);Available PRN/intermittently Type of  Home: House Home Access: Stairs to enter Entrance Stairs-Rails: Can reach both Entrance Stairs-Number of Steps: 3-4 steps   Home Layout: One level Home Equipment: Cane - single Librarian, academic (2 wheels)      Prior Function Prior Level of Function : Independent/Modified Independent             Mobility Comments: Tourist information centre manager without AD, drives, shops ADLs Comments: Reports independence     Extremity/Trunk Assessment   Upper Extremity Assessment Upper Extremity  Assessment: Defer to OT evaluation    Lower Extremity Assessment Lower Extremity Assessment: Overall WFL for tasks assessed    Cervical / Trunk Assessment Cervical / Trunk Assessment: Kyphotic  Communication   Communication Communication: No apparent difficulties    Cognition Arousal: Alert Behavior During Therapy: WFL for tasks assessed/performed                             Following commands: Intact       Cueing Cueing Techniques: Verbal cues     General Comments General comments (skin integrity, edema, etc.): VSS onRA - BP sitting in stretcher is 117/73    Exercises     Assessment/Plan    PT Assessment All further PT needs can be met in the next venue of care  PT Problem List Decreased strength;Decreased activity tolerance;Decreased balance;Decreased mobility       PT Treatment Interventions      PT Goals (Current goals can be found in the Care Plan section)  Acute Rehab PT Goals Patient Stated Goal: return home with friends to assist PT Goal Formulation: With patient Time For Goal Achievement: 04/24/23 Potential to Achieve Goals: Good    Frequency       Co-evaluation               AM-PAC PT "6 Clicks" Mobility  Outcome Measure Help needed turning from your back to your side while in a flat bed without using bedrails?: None Help needed moving from lying on your back to sitting on the side of a flat bed without using bedrails?: None Help needed moving to and from a bed to a chair (including a wheelchair)?: None Help needed standing up from a chair using your arms (e.g., wheelchair or bedside chair)?: None Help needed to walk in hospital room?: A Little Help needed climbing 3-5 steps with a railing? : A Little 6 Click Score: 22    End of Session Equipment Utilized During Treatment: Gait belt Activity Tolerance: Patient tolerated treatment well Patient left: in chair;with call bell/phone within reach Nurse Communication: Mobility  status PT Visit Diagnosis: Unsteadiness on feet (R26.81);Other abnormalities of gait and mobility (R26.89);Muscle weakness (generalized) (M62.81)    Time: 7829-5621 PT Time Calculation (min) (ACUTE ONLY): 24 min   Charges:   PT Evaluation $PT Eval Moderate Complexity: 1 Mod PT Treatments $Therapeutic Activity: 23-37 mins PT General Charges $$ ACUTE PT VISIT: 1 Visit         2:02 PM, 04/24/23 Ocie Bob, MPT Physical Therapist with [facility] [phone] office [phone] mobile phone

## 2023-04-24 NOTE — Hospital Course (Signed)
 68 y.o. male with medical history significant of chronic systolic CHF (LVEF on 8/23-20 5 to 30% -Care Everywhere), CAD, asthma, anxiety who presented to the emergency department due to fall sustained at home.  Patient fell in the bathroom this morning due to loss of balance and was unable to get up.  He lives in a mobile home and drives himself from 1 room to another, Child psychotherapist checked on him today and found him on the floor, so EMS was activated and he was sent to the ED for further evaluation and management.  Patient endorsed pain over left shoulder, left lower back ribs due to before, but denies chest pain, shortness of breath, loss of consciousness and flulike symptoms.  He was hypotensive on arrival to the ED.   ED Course:  In the emergency department, BP was 80/52 on arrival to the ED, other vital signs were within normal range.  Workup in the ED showed normal CBC, BMP showed normal BMP except sodium of 134, BUN 44, creatinine 2.36 (baseline creatinine at 1.0), albumin 3.0, BNP 253 lactic acid was 1.3, total CK3 32, magnesium 1.7.  Influenza A, B, SARS coronavirus 2, RSV was negative.  CT head without contrast showed no acute intracranial abnormalities.  Small subcutaneous Hematoma over the left anterior frontal region.  CT cervical spine without contrast showed no acute displaced fractures.  Lumbar spine x-ray showed degenerative changes and remote compression fractures involving L1 and L2 Left shoulder x-ray showed no acute bony abnormality.  Chest x-ray showed cardiomegaly.  No acute disease.  IV hydration was provided.  Hospitalist was asked admit patient for further evaluation and management.

## 2023-04-24 NOTE — Progress Notes (Signed)
 PROGRESS NOTE   Douglas [name]  ZOX:[id number] DOB: 05-Aug-1955 DOA: 04/23/2023 PCP: Mattie Marlin, DO   Chief Complaint  Patient presents with   Altered Mental Status   Level of care: Stepdown  Brief Admission History:  68 y.o. male with medical history significant of chronic systolic CHF (LVEF on 8/23-20 5 to 30% -Care Everywhere), CAD, asthma, anxiety who presented to the emergency department due to fall sustained at home.  Patient fell in the bathroom this morning due to loss of balance and was unable to get up.  He lives in a mobile home and drives himself from 1 room to another, Child psychotherapist checked on him today and found him on the floor, so EMS was activated and he was sent to the ED for further evaluation and management.  Patient endorsed pain over left shoulder, left lower back ribs due to before, but denies chest pain, shortness of breath, loss of consciousness and flulike symptoms.  He was hypotensive on arrival to the ED.   ED Course:  In the emergency department, BP was 80/52 on arrival to the ED, other vital signs were within normal range.  Workup in the ED showed normal CBC, BMP showed normal BMP except sodium of 134, BUN 44, creatinine 2.36 (baseline creatinine at 1.0), albumin 3.0, BNP 253 lactic acid was 1.3, total CK3 32, magnesium 1.7.  Influenza A, B, SARS coronavirus 2, RSV was negative.  CT head without contrast showed no acute intracranial abnormalities.  Small subcutaneous Hematoma over the left anterior frontal region.  CT cervical spine without contrast showed no acute displaced fractures.  Lumbar spine x-ray showed degenerative changes and remote compression fractures involving L1 and L2 Left shoulder x-ray showed no acute bony abnormality.  Chest x-ray showed cardiomegaly.  No acute disease.  IV hydration was provided.  Hospitalist was asked admit patient for further evaluation and management.   Assessment and Plan:   Fall at home Trauma work up  unrevealing With new right shoulder pain, get Xray right shoulder today Continue supportive measures PT/OT eval requested  Acute kidney injury BUN/creatinine 44/2.36  Continue gentle hydration but further reducing rate given history of ICM Renally adjust medications, avoid nephrotoxic agents/dehydration/hypotension   Scalp hematoma Stable, continue to monitor   Chronic atrial fibrillation Elevated BNP This is chronic (this was 1043 a year ago) Patient is stable.  Continue to monitor   Hypoalbuminemia possibly secondary to moderate protein calorie malnutrition Albumin 3.0, protein supplement will be provided   Chronic systolic CHF Continue Jardiance Toprol-XL, Aldactone, Entresto temporarily held at this time due to soft BP Hopefully can try to resume these medications on 2/27 if BP improved and renal function improved    GERD Continue Protonix   Chronic atrial fibrillation Continue Eliquis Toprol temporarily held due to soft BP   Mixed hyperlipidemia Continue Crestor, Zetia   Anxiety Continue Xanax per home regimen   DVT prophylaxis: apixaban  Code Status: Full  Family Communication: none present Disposition: anticipate DC home in 1-2 days   Consultants:  PT/OT  Procedures:   Antimicrobials:    Subjective: Pt says he is having right shoulder pain and low back pain and they have not xrayed his right shoulder as part of the trauma work up done yesterday. He denies SOB and CP.    Objective: Vitals:   04/24/23 0610 04/24/23 1057 04/24/23 1125 04/24/23 1206  BP: 104/60 116/73 (!) 96/59 (!) 99/58  Pulse: 71 73 80 81  Resp: 16 16 20  18  Temp: 98.7 F (37.1 C)     TempSrc:      SpO2: 99% 99% 96% 97%  Weight:      Height:        Intake/Output Summary (Last 24 hours) at 04/24/2023 1246 Last data filed at 04/24/2023 4098 Gross per 24 hour  Intake 1827.07 ml  Output 1200 ml  Net 627.07 ml   Filed Weights   04/23/23 1733  Weight: 110 kg    Examination:  General exam: Appears calm and comfortable.  His right eye is gone.  Respiratory system: Clear to auscultation. Respiratory effort normal. Cardiovascular system: normal S1 & S2 heard. No JVD, murmurs, rubs, gallops or clicks. No pedal edema. Gastrointestinal system: Abdomen is nondistended, soft and nontender. No organomegaly or masses felt. Normal bowel sounds heard. Central nervous system: Alert and oriented. No focal neurological deficits. Extremities: Symmetric 5 x 5 power. Skin: No rashes, lesions or ulcers. Psychiatry: Judgement and insight appear normal. Mood & affect appropriate.   Data Reviewed: I have personally reviewed following labs and imaging studies  CBC: Recent Labs  Lab 04/23/23 1840 04/24/23 0435  WBC 7.9 6.6  NEUTROABS 4.7  --   HGB 13.3 13.1  HCT 40.3 41.8  MCV 96.2 99.3  PLT 173 201    Basic Metabolic Panel: Recent Labs  Lab 04/23/23 2001 04/24/23 0435  NA 134* 138  K 3.8 3.8  CL 98 104  CO2 24 25  GLUCOSE 98 100*  BUN 44* 37*  CREATININE 2.36* 1.70*  CALCIUM 8.2* 8.3*  MG 1.7 2.0  PHOS  --  3.5    CBG: Recent Labs  Lab 04/23/23 1950 04/23/23 2145  GLUCAP 103* 61*    Recent Results (from the past 240 hours)  Resp panel by RT-PCR (RSV, Flu A&B, Covid) Anterior Nasal Swab     Status: None   Collection Time: 04/23/23  7:01 PM   Specimen: Anterior Nasal Swab  Result Value Ref Range Status   SARS Coronavirus 2 by RT PCR NEGATIVE NEGATIVE Final    Comment: (NOTE) SARS-CoV-2 target nucleic acids are NOT DETECTED.  The SARS-CoV-2 RNA is generally detectable in upper respiratory specimens during the acute phase of infection. The lowest concentration of SARS-CoV-2 viral copies this assay can detect is 138 copies/mL. A negative result does not preclude SARS-Cov-2 infection and should not be used as the sole basis for treatment or other patient management decisions. A negative result may occur with  improper specimen  collection/handling, submission of specimen other than nasopharyngeal swab, presence of viral mutation(s) within the areas targeted by this assay, and inadequate number of viral copies(<138 copies/mL). A negative result must be combined with clinical observations, patient history, and epidemiological information. The expected result is Negative.  Fact Sheet for Patients:  BloggerCourse.com  Fact Sheet for Healthcare Providers:  SeriousBroker.it  This test is no t yet approved or cleared by the [place] FDA and  has been authorized for detection and/or diagnosis of SARS-CoV-2 by FDA under an Emergency Use Authorization (EUA). This EUA will remain  in effect (meaning this test can be used) for the duration of the COVID-19 declaration under Section 564(b)(1) of the Act, 21 U.S.C.section 360bbb-3(b)(1), unless the authorization is terminated  or revoked sooner.       Influenza A by PCR NEGATIVE NEGATIVE Final   Influenza B by PCR NEGATIVE NEGATIVE Final    Comment: (NOTE) The Xpert Xpress SARS-CoV-2/FLU/RSV plus assay is intended as an aid in the  diagnosis of influenza from Nasopharyngeal swab specimens and should not be used as a sole basis for treatment. Nasal washings and aspirates are unacceptable for Xpert Xpress SARS-CoV-2/FLU/RSV testing.  Fact Sheet for Patients: BloggerCourse.com  Fact Sheet for Healthcare Providers: SeriousBroker.it  This test is not yet approved or cleared by the [place] FDA and has been authorized for detection and/or diagnosis of SARS-CoV-2 by FDA under an Emergency Use Authorization (EUA). This EUA will remain in effect (meaning this test can be used) for the duration of the COVID-19 declaration under Section 564(b)(1) of the Act, 21 U.S.C. section 360bbb-3(b)(1), unless the authorization is terminated or revoked.     Resp Syncytial  Virus by PCR NEGATIVE NEGATIVE Final    Comment: (NOTE) Fact Sheet for Patients: BloggerCourse.com  Fact Sheet for Healthcare Providers: SeriousBroker.it  This test is not yet approved or cleared by the [place] FDA and has been authorized for detection and/or diagnosis of SARS-CoV-2 by FDA under an Emergency Use Authorization (EUA). This EUA will remain in effect (meaning this test can be used) for the duration of the COVID-19 declaration under Section 564(b)(1) of the Act, 21 U.S.C. section 360bbb-3(b)(1), unless the authorization is terminated or revoked.  Performed at [facility], [address], [place], [place] [address]      Radiology Studies: DG Shoulder Left Result Date: 04/23/2023 CLINICAL DATA:  Fall EXAM: LEFT SHOULDER - 2+ VIEW COMPARISON:  None Available. FINDINGS: Advanced degenerative changes in the left [facility] joint. Mild degenerative changes in the glenohumeral joint. No acute bony abnormality. Specifically, no fracture, subluxation, or dislocation. IMPRESSION: Degenerative changes.  No acute bony abnormality. Electronically Signed   By: Charlett Nose M.D.   On: 04/23/2023 20:09   DG Lumbar Spine Complete Result Date: 04/23/2023 CLINICAL DATA:  Fall EXAM: LUMBAR SPINE - COMPLETE 4+ VIEW COMPARISON:  CT 06/02/2017 FINDINGS: Mild compression fractures involving the L1 and L2 vertebral bodies. Transverse fractures were noted through the superior vertebral bodies in 2019. This is compatible with a chronic process. No acute fractures or malalignment. Degenerative disc disease with disc space narrowing and spurring. Diffuse degenerative facet disease. IMPRESSION: Degenerative changes and remote compression fractures involving L1 and L2. No acute bony abnormality. Electronically Signed   By: Charlett Nose M.D.   On: 04/23/2023 20:08   DG Chest 2 View Result Date: 04/23/2023 CLINICAL DATA:  Fall EXAM: CHEST - 2 VIEW COMPARISON:   09/07/2021 FINDINGS: Right AICD remains in place, unchanged. Prior CABG. Cardiomegaly. No confluent opacities or effusions. No acute bony abnormality. IMPRESSION: Cardiomegaly.  No active disease. Electronically Signed   By: Charlett Nose M.D.   On: 04/23/2023 20:06   CT Head Wo Contrast Result Date: 04/23/2023 CLINICAL DATA:  Mental status change of unknown cause. Multiple falls at home. Neck trauma. EXAM: CT HEAD WITHOUT CONTRAST CT CERVICAL SPINE WITHOUT CONTRAST TECHNIQUE: Multidetector CT imaging of the head and cervical spine was performed following the standard protocol without intravenous contrast. Multiplanar CT image reconstructions of the cervical spine were also generated. RADIATION DOSE REDUCTION: This exam was performed according to the departmental dose-optimization program which includes automated exposure control, adjustment of the mA and/or kV according to patient size and/or use of iterative reconstruction technique. COMPARISON:  CT head 06/26/2022.  CT cervical spine 08/24/2013 FINDINGS: CT HEAD FINDINGS Brain: No evidence of acute infarction, hemorrhage, hydrocephalus, extra-axial collection or mass lesion/mass effect. Mild cerebral atrophy. Vascular: No hyperdense vessel or unexpected calcification. Skull: Normal. Negative for fracture or focal lesion.  Sinuses/Orbits: Paranasal sinuses and mastoid air cells are clear. Right phthisis bulbi. Other: Small subcutaneous scalp hematoma over the left anterior frontal region. CT CERVICAL SPINE FINDINGS Alignment: Straightening of the usual cervical lordosis with slight anterior subluxation at C4 on C5. Appearances are similar to prior study and are likely positional/degenerative. Normal alignment of the posterior elements. Skull base and vertebrae: Skull base appears intact. No vertebral compression deformities. No focal bone lesion or bone destruction. Soft tissues and spinal canal: No prevertebral soft tissue swelling. No abnormal paraspinal soft  tissue mass or infiltration. Disc levels: Diffuse degenerative changes with disc space narrowing and endplate osteophyte formation throughout. Degenerative changes in the facet joints. Disc osteophyte complex demonstrates anterior effacement of the thecal sac at C5-6 and C6-7 levels. Upper chest: Lung apices are clear. Other: None. IMPRESSION: 1. No acute intracranial abnormalities. Small subcutaneous scalp hematoma over the left anterior frontal region. 2. Alignment of the cervical spine is unchanged since prior study with straightening of usual cervical lordosis and slight anterior subluxation at C4-5. This is likely degenerative/positional. No acute displaced fractures are identified. Electronically Signed   By: Burman Nieves M.D.   On: 04/23/2023 19:37   CT Cervical Spine Wo Contrast Result Date: 04/23/2023 CLINICAL DATA:  Mental status change of unknown cause. Multiple falls at home. Neck trauma. EXAM: CT HEAD WITHOUT CONTRAST CT CERVICAL SPINE WITHOUT CONTRAST TECHNIQUE: Multidetector CT imaging of the head and cervical spine was performed following the standard protocol without intravenous contrast. Multiplanar CT image reconstructions of the cervical spine were also generated. RADIATION DOSE REDUCTION: This exam was performed according to the departmental dose-optimization program which includes automated exposure control, adjustment of the mA and/or kV according to patient size and/or use of iterative reconstruction technique. COMPARISON:  CT head 06/26/2022.  CT cervical spine 08/24/2013 FINDINGS: CT HEAD FINDINGS Brain: No evidence of acute infarction, hemorrhage, hydrocephalus, extra-axial collection or mass lesion/mass effect. Mild cerebral atrophy. Vascular: No hyperdense vessel or unexpected calcification. Skull: Normal. Negative for fracture or focal lesion. Sinuses/Orbits: Paranasal sinuses and mastoid air cells are clear. Right phthisis bulbi. Other: Small subcutaneous scalp hematoma over the  left anterior frontal region. CT CERVICAL SPINE FINDINGS Alignment: Straightening of the usual cervical lordosis with slight anterior subluxation at C4 on C5. Appearances are similar to prior study and are likely positional/degenerative. Normal alignment of the posterior elements. Skull base and vertebrae: Skull base appears intact. No vertebral compression deformities. No focal bone lesion or bone destruction. Soft tissues and spinal canal: No prevertebral soft tissue swelling. No abnormal paraspinal soft tissue mass or infiltration. Disc levels: Diffuse degenerative changes with disc space narrowing and endplate osteophyte formation throughout. Degenerative changes in the facet joints. Disc osteophyte complex demonstrates anterior effacement of the thecal sac at C5-6 and C6-7 levels. Upper chest: Lung apices are clear. Other: None. IMPRESSION: 1. No acute intracranial abnormalities. Small subcutaneous scalp hematoma over the left anterior frontal region. 2. Alignment of the cervical spine is unchanged since prior study with straightening of usual cervical lordosis and slight anterior subluxation at C4-5. This is likely degenerative/positional. No acute displaced fractures are identified. Electronically Signed   By: Burman Nieves M.D.   On: 04/23/2023 19:37    Scheduled Meds:  apixaban  5 mg Oral BID   [START ON 04/25/2023] citalopram  20 mg Oral Daily   ezetimibe  10 mg Oral Daily   feeding supplement  237 mL Oral BID BM   lidocaine  1 patch Transdermal Q24H   [  START ON 04/25/2023] loratadine  10 mg Oral Daily   pantoprazole  40 mg Oral Daily   polyvinyl alcohol  1 drop Right Eye TID   rosuvastatin  40 mg Oral Daily   [START ON 04/25/2023] spironolactone  25 mg Oral Daily   [START ON 04/25/2023] torsemide  20 mg Oral Daily   Continuous Infusions:  lactated ringers 75 mL/hr at 04/24/23 1043    LOS: 0 days   Time spent: 58 mins  Douglas Laventure Laural Benes, MD How to contact the [facility] Attending or  Consulting provider 7A - 7P or covering provider during after hours 7P -7A, for this patient?  Check the care team in [facility] and look for a) attending/consulting TRH provider listed and b) the [facility] team listed Log into [email or web address] to find provider on call.  Locate the [facility] provider you are looking for under [facility] and page to a number that you can be directly reached. If you still have difficulty reaching the provider, please page the [facility] (Director on Call) for the Hospitalists listed on amion for assistance.  04/24/2023, 12:46 PM

## 2023-04-24 NOTE — TOC Initial Note (Signed)
 Transition of Care [facility]) - Initial/Assessment Note    Patient Details  Name: Douglas [name] MRN: [id number] Date of Birth: [birth year]  [facility] CM/SW Contact:    Beather Arbour Phone Number: 04/24/2023, 12:58 PM  Clinical Narrative:                  CSW spoke with patient regarding PT recommendation for HHPT. Patient shared that he has supportive family and neighbors. Pt did share that he had outpatient rehab last time and does not think he need any HHPT. Patient does have cane at home and is still able to drive. TOC will follow.   Expected Discharge Plan: Home/Self Care Barriers to Discharge: Continued Medical Work up   Patient Goals and CMS Choice Patient states their goals for this hospitalization and ongoing recovery are:: return back home CMS Medicare.gov Compare Post Acute Care list provided to:: Patient Choice offered to / list presented to : Patient      Expected Discharge Plan and Services In-house Referral: Clinical Social Work     Living arrangements for the past 2 months: Single Family Home                 DME Arranged: N/A DME Agency: NA       HH Arranged: Refused HH          Prior Living Arrangements/Services Living arrangements for the past 2 months: Single Family Home Lives with:: Self Patient language and need for interpreter reviewed:: Yes Do you feel safe going back to the place where you live?: Yes      Need for Family Participation in Patient Care: Yes (Comment) Care giver support system in place?: Yes (comment) Current home services: DME Criminal Activity/Legal Involvement Pertinent to Current Situation/Hospitalization: No - Comment as needed  Activities of Daily Living   ADL Screening (condition at time of admission) Independently performs ADLs?: Yes (appropriate for developmental age) Is the patient deaf or have difficulty hearing?: No Does the patient have difficulty seeing, even when wearing  glasses/contacts?: No Does the patient have difficulty concentrating, remembering, or making decisions?: No  Permission Sought/Granted      Share Information with NAME: Eliah     Permission granted to share info w Relationship: Patient     Emotional Assessment Appearance:: Appears stated age   Affect (typically observed): Accepting, Appropriate Orientation: : Oriented to Self, Oriented to Place, Oriented to  Time, Oriented to Situation Alcohol / Substance Use: Not Applicable Psych Involvement: No (comment)  Admission diagnosis:  Fall at home [W19.Lorne Skeens, Y92.009] Patient Active Problem List   Diagnosis Date Noted   Fall at home 04/23/2023   Generalized weakness 04/23/2023   Prolonged QT interval 04/23/2023   AKI (acute kidney injury) (HCC) 04/23/2023   Scalp hematoma 04/23/2023   Hypoalbuminemia due to protein-calorie malnutrition (HCC) 04/23/2023   Chronic systolic CHF (congestive heart failure) (HCC) 04/23/2023   Anxiety 04/23/2023   Multiple trauma 06/02/2017   Acute on chronic combined systolic and diastolic CHF (congestive heart failure) (HCC) 06/02/2017   Loss of consciousness (HCC) 06/02/2017   Acute on chronic respiratory failure with hypoxia (HCC) 06/02/2017   Multiple closed fractures of ribs of right side 06/02/2017   Contusion of right lung without open wound into thorax 06/02/2017   Leukocytosis 06/02/2017   Elevated random blood glucose level 06/02/2017   Coronary artery disease 06/02/2017   Pacemaker 06/02/2017   Diverticulosis 06/02/2017   Closed L1 vertebral fracture (HCC) 06/02/2017  Closed L2 vertebral fracture (HCC) 06/02/2017   Vertebral compression fracture (HCC) 06/02/2017   Closed fracture of right orbital floor (HCC) 06/02/2017   Right orbit fracture, closed, initial encounter (HCC) 06/02/2017   Rupture of globe of eye following blunt trauma, right, initial encounter 06/02/2017   Contusion of face 06/02/2017   Spondylosis of cervical spine  06/02/2017   Persistent atrial fibrillation (HCC) 06/02/2017   Coagulopathy (HCC) 06/02/2017   Ganglion cyst of dorsum of right wrist 11/04/2015   Polypharmacy 11/04/2015   Allergic rhinitis 12/27/2014   Asthma 12/27/2014   Chronic low back pain 12/27/2014   Chronic pain 12/27/2014   Depression 12/27/2014   Gastroesophageal reflux disease without esophagitis 12/27/2014   Generalized anxiety disorder 12/27/2014   Impaired fasting glucose 12/27/2014   Insomnia 12/27/2014   Right shoulder pain 12/27/2014   Nausea 12/27/2014   Class 2 obesity due to excess calories in adult 12/27/2014   Hyperlipidemia 12/27/2014   DJD (degenerative joint disease), lumbosacral 08/26/2013   Seizure (HCC) 08/25/2013   Atrial fibrillation, chronic (HCC) 08/25/2013   Substance abuse (HCC) 08/25/2013   Ventricular tachycardia (HCC) 05/23/2011   Coronary arteriosclerosis    DYSLIPIDEMIA 05/01/2010   MORBID OBESITY 05/01/2010   Essential hypertension 05/01/2010   CARDIOMYOPATHY, ISCHEMIC 05/01/2010   Automatic implantable cardioverter-defibrillator in situ 05/01/2010   PCP:  Mattie Marlin, DO Pharmacy:   [facility] [phone] - [place], [place] - [address] AT [facility] OF [address] [place] [address] Phone: [phone] Fax: [phone]  [facility] - [place], [place] - [address] [place] [address] Phone: [phone] Fax: [phone]  [facility] - [place], [place] - [address] [place] [address] Phone: [phone] Fax: [phone]  [id number] - [place], [place] - [address] AT SEC OF [address] [place] [address] Phone: [phone] Fax: [phone]     Social Drivers of Health (SDOH) Social History: SDOH Screenings   Food Insecurity: No Food Insecurity (04/24/2023)  Housing: Low Risk  (04/24/2023)  Transportation Needs:  No Transportation Needs (04/24/2023)  Utilities: Not At Risk (04/24/2023)  Social Connections: Unknown (04/24/2023)  Tobacco Use: Low Risk  (04/23/2023)  Recent Concern: Tobacco Use - Medium Risk (02/12/2023)   Received from [facility]   SDOH Interventions:     Readmission Risk Interventions     No data to display

## 2023-04-24 NOTE — Plan of Care (Signed)
   Problem: Education: Goal: Knowledge of General Education information will improve Description: Including pain rating scale, medication(s)/side effects and non-pharmacologic comfort measures Outcome: Progressing   Problem: Clinical Measurements: Goal: Will remain free from infection Outcome: Progressing   Problem: Activity: Goal: Risk for activity intolerance will decrease Outcome: Progressing

## 2023-04-24 NOTE — Evaluation (Signed)
 Occupational Therapy Evaluation Patient Details Name: Douglas [name] MRN: [id number] DOB: Dec 26, 1955 Today's Date: 04/24/2023   History of Present Illness   68 y.o. male with medical history significant of chronic systolic CHF (LVEF on 8/23-20 5 to 30% -Care Everywhere), CAD, asthma, anxiety who presented to the emergency department due to fall sustained at home.  Patient fell in the bathroom this morning due to loss of balance and was unable to get up.     Clinical Impressions Pt admitted for concerns listed above. 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 At this time, he presents with mild balance concerns, specifically when reaching outside his base of support. He requires intermittent cuing for safety as well. Overall independent with all ADL's, however benefits from [facility] for safety. OT recommending continued skilled OT once medically stable to discharge home, in order to ensure safety at home and return to independence. Acute OT will sign off at this time.      If plan is discharge home, recommend the following:   A little help with walking and/or transfers;A little help with bathing/dressing/bathroom;Assistance with cooking/housework     Functional Status Assessment   Patient has had a recent decline in their functional status and demonstrates the ability to make significant improvements in function in a reasonable and predictable amount of time.     Equipment Recommendations   None recommended by OT     Recommendations for Other Services         Precautions/Restrictions   Precautions Precautions: Fall Recall of Precautions/Restrictions: Intact Restrictions Weight Bearing Restrictions Per Provider Order: No     Mobility Bed Mobility Overal bed mobility: Modified Independent                  Transfers Overall transfer level: Modified independent Equipment used: Rolling walker (2 wheels)                       Balance Overall balance assessment: Mild deficits observed, not formally tested                                         ADL either performed or assessed with clinical judgement   ADL Overall ADL's : Needs assistance/impaired Eating/Feeding: Independent;Sitting   Grooming: Supervision/safety;Contact guard assist;Standing   Upper Body Bathing: Independent;Sitting   Lower Body Bathing: Contact guard assist;Sitting/lateral leans;Sit to/from stand   Upper Body Dressing : Independent;Standing   Lower Body Dressing: Contact guard assist;Sitting/lateral leans;Sit to/from stand   Toilet Transfer: Supervision/safety;Rolling walker (2 wheels)   Toileting- Clothing Manipulation and Hygiene: Supervision/safety;Contact guard assist;Sitting/lateral lean;Sit to/from stand       Functional mobility during ADLs: Supervision/safety;Rolling walker (2 wheels) General ADL Comments: CGA intermittently for safety when reaching out of basse of support.     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)) Ability to See in Adequate Light: 2 Moderately impaired Patient Visual Report: No change from baseline Vision Assessment?: No apparent visual deficits     Perception         Praxis         Pertinent Vitals/Pain Pain Assessment Pain Assessment: No/denies pain     Extremity/Trunk Assessment Upper Extremity Assessment Upper Extremity Assessment: Generalized weakness   Lower Extremity Assessment Lower Extremity Assessment: Defer to PT evaluation   Cervical / Trunk Assessment  Cervical / Trunk Assessment: Kyphotic   Communication Communication Communication: No apparent difficulties   Cognition Arousal: Alert Behavior During Therapy: WFL for tasks assessed/performed Cognition: No apparent impairments                               Following commands: Intact       Cueing  General Comments   Cueing Techniques: Verbal  cues;Tactile cues      Exercises     Shoulder Instructions      Home Living Family/patient expects to be discharged to:: Private residence Living Arrangements: Alone   Type of Home: House Home Access: Stairs to enter [employer] of Steps: 3-4 steps Entrance Stairs-Rails: Can reach both Home Layout: One level     Bathroom Shower/Tub: Chief Strategy Officer: Standard     Home Equipment: Cane - single Librarian, academic (2 wheels)          Prior Functioning/Environment Prior Level of Function : Independent/Modified Independent             Mobility Comments: Reports independence ADLs Comments: Reports independence    OT Problem List: Decreased strength;Decreased activity tolerance;Impaired balance (sitting and/or standing);Decreased safety awareness;Decreased knowledge of use of DME or AE;Impaired UE functional use   OT Treatment/Interventions:        OT Goals(Current goals can be found in the care plan section)   Acute Rehab OT Goals Patient Stated Goal:  (To go home now.) OT Goal Formulation: With patient Time For Goal Achievement: 04/24/23 Potential to Achieve Goals: Good   OT Frequency:       Co-evaluation              AM-PAC OT "6 Clicks" Daily Activity     Outcome Measure Help from another person eating meals?: None Help from another person taking care of personal grooming?: A Little Help from another person toileting, which includes using toliet, bedpan, or urinal?: A Little Help from another person bathing (including washing, rinsing, drying)?: A Little Help from another person to put on and taking off regular upper body clothing?: None Help from another person to put on and taking off regular lower body clothing?: A Little 6 Click Score: 20   End of Session Equipment Utilized During Treatment: Rolling walker (2 wheels) Nurse Communication: Mobility status  Activity Tolerance: Patient tolerated treatment  well Patient left: in bed;with call bell/phone within reach  OT Visit Diagnosis: Other abnormalities of gait and mobility (R26.89);Unsteadiness on feet (R26.81);Muscle weakness (generalized) (M62.81)                Time: 1610-9604 OT Time Calculation (min): 31 min Charges:  OT General Charges $OT Visit: 1 Visit OT Evaluation $OT Eval Moderate Complexity: 1 Mod OT Treatments $Self Care/Home Management : 8-22 mins  Trish Mage, OTR/L [facility] Acute Rehabilitation  Douglas [name] Douglas [name] 04/24/2023, 11:40 AM

## 2023-04-24 NOTE — ED Notes (Signed)
 Dr Thomes Dinning explained to pt why he could not order percocet and entresto at this time as pt BP is to low. Pt verbalized understanding. Alen Blew and [place], charge RN made aware that pt bed changed to SD

## 2023-04-25 DIAGNOSIS — R531 Weakness: Secondary | ICD-10-CM | POA: Diagnosis not present

## 2023-04-25 DIAGNOSIS — W19XXXD Unspecified fall, subsequent encounter: Secondary | ICD-10-CM | POA: Diagnosis not present

## 2023-04-25 DIAGNOSIS — I482 Chronic atrial fibrillation, unspecified: Secondary | ICD-10-CM | POA: Diagnosis not present

## 2023-04-25 DIAGNOSIS — N179 Acute kidney failure, unspecified: Secondary | ICD-10-CM | POA: Diagnosis not present

## 2023-04-25 LAB — BASIC METABOLIC PANEL
Anion gap: 7 (ref 5–15)
BUN: 21 mg/dL (ref 8–23)
CO2: 25 mmol/L (ref 22–32)
Calcium: 8.8 mg/dL — ABNORMAL LOW (ref 8.9–10.3)
Chloride: 108 mmol/L (ref 98–111)
Creatinine, Ser: 1.09 mg/dL (ref 0.61–1.24)
GFR, Estimated: >60 mL/min (ref >60)
Glucose, Bld: 94 mg/dL (ref 70–99)
Potassium: 4.4 mmol/L (ref 3.5–5.1)
Sodium: 140 mmol/L (ref 135–145)

## 2023-04-25 LAB — CBC WITH DIFFERENTIAL/PLATELET
Abs Immature Granulocytes: 0.02 10*3/uL (ref 0.00–0.07)
Basophils Absolute: 0 10*3/uL (ref 0.0–0.1)
Basophils Relative: 0 %
Eosinophils Absolute: 0.1 10*3/uL (ref 0.0–0.5)
Eosinophils Relative: 2 %
HCT: 42.2 % (ref 39.0–52.0)
Hemoglobin: 13.1 g/dL (ref 13.0–17.0)
Immature Granulocytes: 0 %
Lymphocytes Relative: 30 %
Lymphs Abs: 2 10*3/uL (ref 0.7–4.0)
MCH: 30.9 pg (ref 26.0–34.0)
MCHC: 31 g/dL (ref 30.0–36.0)
MCV: 99.5 fL (ref 80.0–100.0)
Monocytes Absolute: 0.7 10*3/uL (ref 0.1–1.0)
Monocytes Relative: 10 %
Neutro Abs: 3.9 10*3/uL (ref 1.7–7.7)
Neutrophils Relative %: 58 %
Platelets: 209 10*3/uL (ref 150–400)
RBC: 4.24 MIL/uL (ref 4.22–5.81)
RDW: 15.1 % (ref 11.5–15.5)
WBC: 6.8 10*3/uL (ref 4.0–10.5)
nRBC: 0 % (ref 0.0–0.2)

## 2023-04-25 LAB — MAGNESIUM: Magnesium: 1.8 mg/dL (ref 1.7–2.4)

## 2023-04-25 MED ORDER — METOPROLOL SUCCINATE ER 100 MG PO TB24
50.0000 mg | ORAL_TABLET | Freq: Every day | ORAL | Status: DC
Start: 2023-04-25 — End: 2023-05-31

## 2023-04-25 NOTE — Plan of Care (Signed)
   Problem: Activity: Goal: Risk for activity intolerance will decrease Outcome: Progressing   Problem: Coping: Goal: Level of anxiety will decrease Outcome: Progressing   Problem: Pain Managment: Goal: General experience of comfort will improve and/or be controlled Outcome: Progressing

## 2023-04-25 NOTE — Care Management Obs Status (Signed)
 MEDICARE OBSERVATION STATUS NOTIFICATION   Patient Details  Name: BUDDY LOEFFELHOLZ MRN: [id number] Date of Birth: [birth year]   Medicare Observation Status Notification Given:  Yes    Corey Harold 04/25/2023, 8:41 AM

## 2023-04-25 NOTE — TOC Transition Note (Signed)
 Transition of Care [facility]) - Discharge Note   Patient Details  Name: Douglas [name] MRN: [id number] Date of Birth: 12/17/1955  [facility] CM/SW Contact:  Beather Arbour Phone Number: 04/25/2023, 9:53 AM   Clinical Narrative:    Pt is discharging home today with no needs. Patient refused HHPT to MD this morning. TOC signing off.    Final next level of care: Home/Self Care Barriers to Discharge: Barriers Resolved   Patient Goals and CMS Choice Patient states their goals for this hospitalization and ongoing recovery are:: return back home CMS Medicare.gov Compare Post Acute Care list provided to:: Patient Choice offered to / list presented to : Patient      Discharge Placement      Home         Name of family member notified: Dylyn - patient Patient and family notified of of transfer: 04/25/23  Discharge Plan and Services Additional resources added to the After Visit Summary for   In-house Referral: Clinical Social Work              DME Arranged: N/A DME Agency: NA       HH Arranged: Refused HH HH Agency: NA        Social Drivers of Health (SDOH) Interventions SDOH Screenings   Food Insecurity: No Food Insecurity (04/24/2023)  Housing: Low Risk  (04/24/2023)  Transportation Needs: No Transportation Needs (04/24/2023)  Utilities: Not At Risk (04/24/2023)  Social Connections: Unknown (04/24/2023)  Tobacco Use: Low Risk  (04/23/2023)  Recent Concern: Tobacco Use - Medium Risk (02/12/2023)   Received from [facility]     Readmission Risk Interventions     No data to display

## 2023-04-25 NOTE — Discharge Summary (Signed)
 Physician Discharge Summary  Douglas [name] ZOX:[id number] DOB: 04-18-1955 DOA: 04/23/2023  PCP: Mattie Marlin, DO Cardiology: Evalee Jefferson   Admit date: 04/23/2023 Discharge date: 04/25/2023  Admitted From: Home  Disposition:  Home (Declined HH Services)   Recommendations for Outpatient Follow-up:  Follow up with PCP in 1 weeks Follow up with cardiology clinic in 1-2 weeks  Please obtain BMP/CBC in 1-2 weeks  Home Health:  DECLINED   Discharge Condition: STABLE   CODE STATUS: FULL DIET: Heart Healthy low sodium    Brief Hospitalization Summary: Please see all hospital notes, images, labs for full details of the hospitalization. Admission provider HPI:  68 y.o. male with medical history significant of chronic systolic CHF (LVEF on 8/23-20 5 to 30% -Care Everywhere), CAD, asthma, anxiety who presented to the emergency department due to fall sustained at home.  Patient fell in the bathroom this morning due to loss of balance and was unable to get up.  He lives in a mobile home and drives himself from 1 room to another, Child psychotherapist checked on him today and found him on the floor, so EMS was activated and he was sent to the ED for further evaluation and management.  Patient endorsed pain over left shoulder, left lower back ribs due to before, but denies chest pain, shortness of breath, loss of consciousness and flulike symptoms.  He was hypotensive on arrival to the ED.   ED Course:  In the emergency department, BP was 80/52 on arrival to the ED, other vital signs were within normal range.  Workup in the ED showed normal CBC, BMP showed normal BMP except sodium of 134, BUN 44, creatinine 2.36 (baseline creatinine at 1.0), albumin 3.0, BNP 253 lactic acid was 1.3, total CK3 32, magnesium 1.7.  Influenza A, B, SARS coronavirus 2, RSV was negative.  CT head without contrast showed no acute intracranial abnormalities.  Small subcutaneous Hematoma over the left anterior frontal region.  CT  cervical spine without contrast showed no acute displaced fractures.  Lumbar spine x-ray showed degenerative changes and remote compression fractures involving L1 and L2 Left shoulder x-ray showed no acute bony abnormality.  Chest x-ray showed cardiomegaly.  No acute disease.  IV hydration was provided.  Hospitalist was asked admit patient for further evaluation and management.  Hospital Course by problem list   Fall at home Trauma work up unrevealing Xray right shoulder no acute findings  Continue supportive measures PT/OT eval requested - PT DECLINING HH SERVICES   Acute kidney injury BUN/creatinine 44/2.36 --- improved to 1.09 after hydration  He was given gentle hydration Renally adjusted medications, avoid nephrotoxic agents/dehydration/hypotension   Scalp hematoma Stable, continue to monitor outpatient    Chronic atrial fibrillation Elevated BNP This is chronic Patient is stable.  Continue to monitor   Hypoalbuminemia possibly secondary to moderate protein calorie malnutrition Albumin 3.0, protein supplement will be provided   Chronic systolic CHF Continue Jardiance Toprol-XL, Aldactone, Entresto temporarily held until BPs improved Resume home meds at DC with close outpatient follow up recommended with PCP and cardiology    GERD Continue Protonix   Chronic atrial fibrillation Continue Eliquis Toprol reduced to 50 mg daily    Mixed hyperlipidemia Continue Crestor, Zetia   Anxiety Continue Xanax per home regimen   Discharge Diagnoses:  Principal Problem:   Fall at home Active Problems:   Atrial fibrillation, chronic (HCC)   Gastroesophageal reflux disease without esophagitis   Generalized weakness   Prolonged QT interval  AKI (acute kidney injury) (HCC)   Scalp hematoma   Hypoalbuminemia due to protein-calorie malnutrition (HCC)   Chronic systolic CHF (congestive heart failure) (HCC)   Anxiety   Discharge Instructions:  Allergies as of 04/25/2023        Reactions   Codeine Hives, Itching   Lasix [furosemide] Other (See Comments)   Patient says he gets light headed and dizzy.    Xarelto [rivaroxaban] Other (See Comments)   Bleeding ulcers   Vioxx [rofecoxib] Palpitations, Other (See Comments)   Caused massive heart attack        Medication List     TAKE these medications    ALPRAZolam 1 MG tablet Commonly known as: XANAX Take 1-2 mg by mouth 2 (two) times daily as needed for anxiety.   ARTIFICIAL TEARS OP Place 1 drop into the right eye 3 (three) times daily.   citalopram 20 MG tablet Commonly known as: CELEXA Take 20 mg by mouth daily.   Eliquis 5 MG Tabs tablet Generic drug: apixaban TAKE ONE TABLET BY MOUTH TWICE DAILY What changed: how much to take   Entresto 97-103 MG Generic drug: sacubitril-valsartan Take 1 tablet by mouth 2 (two) times daily.   ezetimibe 10 MG tablet Commonly known as: ZETIA Take 10 mg by mouth daily.   Fish Oil 1200 MG Caps Take 1,200 mg by mouth daily.   fluticasone 50 MCG/ACT nasal spray Commonly known as: FLONASE Place 1 spray into both nostrils daily.   Jardiance 10 MG Tabs tablet Generic drug: empagliflozin Take 10 mg by mouth daily.   lansoprazole 30 MG capsule Commonly known as: PREVACID Take 30 mg by mouth daily.   loratadine 10 MG tablet Commonly known as: CLARITIN Take 10 mg by mouth daily.   metoprolol succinate 100 MG 24 hr tablet Commonly known as: TOPROL-XL Take 0.5 tablets (50 mg total) by mouth daily. Take with or immediately following a meal. What changed: See the new instructions.   multivitamin with minerals Tabs tablet Take 1 tablet by mouth daily. And balance of nature   oxyCODONE-acetaminophen 10-325 MG tablet Commonly known as: PERCOCET Take 1 tablet by mouth every 6 (six) hours as needed for pain.   rosuvastatin 40 MG tablet Commonly known as: CRESTOR TAKE ONE (1) TABLET EACH DAY   spironolactone 25 MG tablet Commonly known as:  ALDACTONE TAKE ONE (1) TABLET EACH DAY   tiZANidine 4 MG tablet Commonly known as: ZANAFLEX Take 4 mg by mouth every 8 (eight) hours as needed for muscle spasms.   torsemide 20 MG tablet Commonly known as: DEMADEX TAKE ONE (1) TABLET EACH DAY        Follow-up Information     Lazoff, Shawn P, DO. Schedule an appointment as soon as possible for a visit in 1 week(s).   Specialty: Family Medicine Why: Hospital Follow Up Contact information: [address] [place] [address] [phone]         Hermenia Bers Schedule an appointment as soon as possible for a visit in 1 week(s).   Why: Hospital Follow Up Contact information: [address] [place] [address] [phone]                Allergies  Allergen Reactions   Codeine Hives and Itching   Lasix [Furosemide] Other (See Comments)    Patient says he gets light headed and dizzy.    Xarelto [Rivaroxaban] Other (See Comments)    Bleeding ulcers   Vioxx [Rofecoxib] Palpitations  and Other (See Comments)    Caused massive heart attack   Allergies as of 04/25/2023       Reactions   Codeine Hives, Itching   Lasix [furosemide] Other (See Comments)   Patient says he gets light headed and dizzy.    Xarelto [rivaroxaban] Other (See Comments)   Bleeding ulcers   Vioxx [rofecoxib] Palpitations, Other (See Comments)   Caused massive heart attack        Medication List     TAKE these medications    ALPRAZolam 1 MG tablet Commonly known as: XANAX Take 1-2 mg by mouth 2 (two) times daily as needed for anxiety.   ARTIFICIAL TEARS OP Place 1 drop into the right eye 3 (three) times daily.   citalopram 20 MG tablet Commonly known as: CELEXA Take 20 mg by mouth daily.   Eliquis 5 MG Tabs tablet Generic drug: apixaban TAKE ONE TABLET BY MOUTH TWICE DAILY What changed: how much to take   Entresto 97-103 MG Generic drug: sacubitril-valsartan Take 1 tablet by mouth 2 (two) times daily.    ezetimibe 10 MG tablet Commonly known as: ZETIA Take 10 mg by mouth daily.   Fish Oil 1200 MG Caps Take 1,200 mg by mouth daily.   fluticasone 50 MCG/ACT nasal spray Commonly known as: FLONASE Place 1 spray into both nostrils daily.   Jardiance 10 MG Tabs tablet Generic drug: empagliflozin Take 10 mg by mouth daily.   lansoprazole 30 MG capsule Commonly known as: PREVACID Take 30 mg by mouth daily.   loratadine 10 MG tablet Commonly known as: CLARITIN Take 10 mg by mouth daily.   metoprolol succinate 100 MG 24 hr tablet Commonly known as: TOPROL-XL Take 0.5 tablets (50 mg total) by mouth daily. Take with or immediately following a meal. What changed: See the new instructions.   multivitamin with minerals Tabs tablet Take 1 tablet by mouth daily. And balance of nature   oxyCODONE-acetaminophen 10-325 MG tablet Commonly known as: PERCOCET Take 1 tablet by mouth every 6 (six) hours as needed for pain.   rosuvastatin 40 MG tablet Commonly known as: CRESTOR TAKE ONE (1) TABLET EACH DAY   spironolactone 25 MG tablet Commonly known as: ALDACTONE TAKE ONE (1) TABLET EACH DAY   tiZANidine 4 MG tablet Commonly known as: ZANAFLEX Take 4 mg by mouth every 8 (eight) hours as needed for muscle spasms.   torsemide 20 MG tablet Commonly known as: DEMADEX TAKE ONE (1) TABLET EACH DAY        Procedures/Studies: DG Shoulder Right Result Date: 04/24/2023 CLINICAL DATA:  Fall and trauma to the right shoulder.  Pain. EXAM: RIGHT SHOULDER - 2+ VIEW COMPARISON:  None Available. FINDINGS: There is no acute fracture or dislocation. There is mild arthritic changes of the right shoulder. Degenerative changes of the right AC joint. The soft tissues are unremarkable. A pacemaker device noted over the right chest. IMPRESSION: 1. No acute fracture or dislocation. 2. Mild arthritic changes. Electronically Signed   By: Elgie Collard M.D.   On: 04/24/2023 14:25   DG Shoulder Left Result  Date: 04/23/2023 CLINICAL DATA:  Fall EXAM: LEFT SHOULDER - 2+ VIEW COMPARISON:  None Available. FINDINGS: Advanced degenerative changes in the left [facility] joint. Mild degenerative changes in the glenohumeral joint. No acute bony abnormality. Specifically, no fracture, subluxation, or dislocation. IMPRESSION: Degenerative changes.  No acute bony abnormality. Electronically Signed   By: Charlett Nose M.D.   On: 04/23/2023 20:09   DG Lumbar  Spine Complete Result Date: 04/23/2023 CLINICAL DATA:  Fall EXAM: LUMBAR SPINE - COMPLETE 4+ VIEW COMPARISON:  CT 06/02/2017 FINDINGS: Mild compression fractures involving the L1 and L2 vertebral bodies. Transverse fractures were noted through the superior vertebral bodies in 2019. This is compatible with a chronic process. No acute fractures or malalignment. Degenerative disc disease with disc space narrowing and spurring. Diffuse degenerative facet disease. IMPRESSION: Degenerative changes and remote compression fractures involving L1 and L2. No acute bony abnormality. Electronically Signed   By: Charlett Nose M.D.   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 Prior CABG. Cardiomegaly. No confluent opacities or effusions. No acute bony abnormality. IMPRESSION: Cardiomegaly.  No active disease. Electronically Signed   By: Charlett Nose M.D.   On: 04/23/2023 20:06   CT Head Wo Contrast Result Date: 04/23/2023 CLINICAL DATA:  Mental status change of unknown cause. Multiple falls at home. Neck trauma. EXAM: CT HEAD WITHOUT CONTRAST CT CERVICAL SPINE WITHOUT CONTRAST TECHNIQUE: Multidetector CT imaging of the head and cervical spine was performed following the standard protocol without intravenous contrast. Multiplanar CT image reconstructions of the cervical spine were also generated. RADIATION DOSE REDUCTION: This exam was performed according to  the departmental dose-optimization program which includes automated exposure control, adjustment of the mA and/or kV according to patient size and/or use of iterative reconstruction technique. COMPARISON:  CT head 06/26/2022.  CT cervical spine 08/24/2013 FINDINGS: CT HEAD FINDINGS Brain: No evidence of acute infarction, hemorrhage, hydrocephalus, extra-axial collection or mass lesion/mass effect. Mild cerebral atrophy. Vascular: No hyperdense vessel or unexpected calcification. Skull: Normal. Negative for fracture or focal lesion. Sinuses/Orbits: Paranasal sinuses and mastoid air cells are clear. Right phthisis bulbi. Other: Small subcutaneous scalp hematoma over the left anterior frontal region. CT CERVICAL SPINE FINDINGS Alignment: Straightening of the usual cervical lordosis with slight anterior subluxation at C4 on C5. Appearances are similar to prior study and are likely positional/degenerative. Normal alignment of the posterior elements. Skull base and vertebrae: Skull base appears intact. No vertebral compression deformities. No focal bone lesion or bone destruction. Soft tissues and spinal canal: No prevertebral soft tissue swelling. No abnormal paraspinal soft tissue mass or infiltration. Disc levels: Diffuse degenerative changes with disc space narrowing and endplate osteophyte formation throughout. Degenerative changes in the facet joints. Disc osteophyte complex demonstrates anterior effacement of the thecal sac at C5-6 and C6-7 levels. Upper chest: Lung apices are clear. Other: None. IMPRESSION: 1. No acute intracranial abnormalities. Small subcutaneous scalp hematoma over the left anterior frontal region. 2. Alignment of the cervical spine is unchanged since prior study with straightening of usual cervical lordosis and slight anterior subluxation at C4-5. This is likely degenerative/positional. No acute displaced fractures are identified. Electronically Signed   By: Burman Nieves M.D.   On:  04/23/2023 19:37   CT Cervical Spine Wo Contrast Result Date: 04/23/2023 CLINICAL DATA:  Mental status change of unknown cause. Multiple falls at home. Neck trauma. EXAM: CT HEAD WITHOUT CONTRAST CT CERVICAL SPINE WITHOUT CONTRAST TECHNIQUE: Multidetector CT imaging of the head and cervical spine was performed following the standard protocol without intravenous contrast. Multiplanar CT image reconstructions of the cervical spine were also generated. RADIATION DOSE REDUCTION: This exam was performed according to the departmental dose-optimization program which includes automated exposure control, adjustment of the mA and/or kV according to patient size and/or use of iterative  reconstruction technique. COMPARISON:  CT head 06/26/2022.  CT cervical spine 08/24/2013 FINDINGS: CT HEAD FINDINGS Brain: No evidence of acute infarction, hemorrhage, hydrocephalus, extra-axial collection or mass lesion/mass effect. Mild cerebral atrophy. Vascular: No hyperdense vessel or unexpected calcification. Skull: Normal. Negative for fracture or focal lesion. Sinuses/Orbits: Paranasal sinuses and mastoid air cells are clear. Right phthisis bulbi. Other: Small subcutaneous scalp hematoma over the left anterior frontal region. CT CERVICAL SPINE FINDINGS Alignment: Straightening of the usual cervical lordosis with slight anterior subluxation at C4 on C5. Appearances are similar to prior study and are likely positional/degenerative. Normal alignment of the posterior elements. Skull base and vertebrae: Skull base appears intact. No vertebral compression deformities. No focal bone lesion or bone destruction. Soft tissues and spinal canal: No prevertebral soft tissue swelling. No abnormal paraspinal soft tissue mass or infiltration. Disc levels: Diffuse degenerative changes with disc space narrowing and endplate osteophyte formation throughout. Degenerative changes in the facet joints. Disc osteophyte complex demonstrates anterior  effacement of the thecal sac at C5-6 and C6-7 levels. Upper chest: Lung apices are clear. Other: None. IMPRESSION: 1. No acute intracranial abnormalities. Small subcutaneous scalp hematoma over the left anterior frontal region. 2. Alignment of the cervical spine is unchanged since prior study with straightening of usual cervical lordosis and slight anterior subluxation at C4-5. This is likely degenerative/positional. No acute displaced fractures are identified. Electronically Signed   By: Burman Nieves M.D.   On: 04/23/2023 19:37     Subjective: Pt insisting on going home today.  He is refusing to get home health services.  He has no specific complaints and feels back to his baseline.   Discharge Exam: Vitals:   04/25/23 0113 04/25/23 0733  BP:  106/79  Pulse: 94 97  Resp:    Temp:  97.9 F (36.6 C)  SpO2:  97%   Vitals:   04/24/23 1939 04/25/23 0021 04/25/23 0113 04/25/23 0733  BP: 110/62 113/70  106/79  Pulse: 100 (!) 158 94 97  Resp:  20    Temp: 98.2 F (36.8 C) 98 F (36.7 C)  97.9 F (36.6 C)  TempSrc: Oral Oral  Oral  SpO2: 95% 95%  97%  Weight:      Height:        General: Pt is alert, awake, not in acute distress Cardiovascular: normal S1/S2 +, no rubs, no gallops Respiratory: CTA bilaterally, no wheezing, no rhonchi Abdominal: Soft, NT, ND, bowel sounds + Extremities: 1+ edema bilateral LEs, no cyanosis   The results of significant diagnostics from this hospitalization (including imaging, microbiology, ancillary and laboratory) are listed below for reference.     Microbiology: Recent Results (from the past 240 hours)  Resp panel by RT-PCR (RSV, Flu A&B, Covid) Anterior Nasal Swab     Status: None   Collection Time: 04/23/23  7:01 PM   Specimen: Anterior Nasal Swab  Result Value Ref Range Status   SARS Coronavirus 2 by RT PCR NEGATIVE NEGATIVE Final    Comment: (NOTE) SARS-CoV-2 target nucleic acids are NOT DETECTED.  The SARS-CoV-2 RNA is generally  detectable in upper respiratory specimens during the acute phase of infection. The lowest concentration of SARS-CoV-2 viral copies this assay can detect is 138 copies/mL. A negative result does not preclude SARS-Cov-2 infection and should not be used as the sole basis for treatment or other patient management decisions. A negative result may occur with  improper specimen collection/handling, submission of specimen other than nasopharyngeal swab, presence of viral mutation(s) within  the areas targeted by this assay, and inadequate number of viral copies(<138 copies/mL). A negative result must be combined with clinical observations, patient history, and epidemiological information. The expected result is Negative.  Fact Sheet for Patients:  BloggerCourse.com  Fact Sheet for Healthcare Providers:  SeriousBroker.it  This test is no t yet approved or cleared by the [place] FDA and  has been authorized for detection and/or diagnosis of SARS-CoV-2 by FDA under an Emergency Use Authorization (EUA). This EUA will remain  in effect (meaning this test can be used) for the duration of the COVID-19 declaration under Section 564(b)(1) of the Act, 21 U.S.C.section 360bbb-3(b)(1), unless the authorization is terminated  or revoked sooner.       Influenza A by PCR NEGATIVE NEGATIVE Final   Influenza B by PCR NEGATIVE NEGATIVE Final    Comment: (NOTE) The Xpert Xpress SARS-CoV-2/FLU/RSV plus assay is intended as an aid in the diagnosis of influenza from Nasopharyngeal swab specimens and should not be used as a sole basis for treatment. Nasal washings and aspirates are unacceptable for Xpert Xpress SARS-CoV-2/FLU/RSV testing.  Fact Sheet for Patients: BloggerCourse.com  Fact Sheet for Healthcare Providers: SeriousBroker.it  This test is not yet approved or cleared by the [place] FDA  and has been authorized for detection and/or diagnosis of SARS-CoV-2 by FDA under an Emergency Use Authorization (EUA). This EUA will remain in effect (meaning this test can be used) for the duration of the COVID-19 declaration under Section 564(b)(1) of the Act, 21 U.S.C. section 360bbb-3(b)(1), unless the authorization is terminated or revoked.     Resp Syncytial Virus by PCR NEGATIVE NEGATIVE Final    Comment: (NOTE) Fact Sheet for Patients: BloggerCourse.com  Fact Sheet for Healthcare Providers: SeriousBroker.it  This test is not yet approved or cleared by the [place] FDA and has been authorized for detection and/or diagnosis of SARS-CoV-2 by FDA under an Emergency Use Authorization (EUA). This EUA will remain in effect (meaning this test can be used) for the duration of the COVID-19 declaration under Section 564(b)(1) of the Act, 21 U.S.C. section 360bbb-3(b)(1), unless the authorization is terminated or revoked.  Performed at [facility], [address], [place], [place] [address]      Labs: BNP (last 3 results) Recent Labs    04/23/23 1840  BNP 253.0*   Basic Metabolic Panel: Recent Labs  Lab 04/23/23 2001 04/24/23 0435 04/25/23 0350  NA 134* 138 140  K 3.8 3.8 4.4  CL 98 104 108  CO2 24 25 25   GLUCOSE 98 100* 94  BUN 44* 37* 21  CREATININE 2.36* 1.70* 1.09  CALCIUM 8.2* 8.3* 8.8*  MG 1.7 2.0 1.8  PHOS  --  3.5  --    Liver Function Tests: Recent Labs  Lab 04/23/23 2001 04/24/23 0435  AST 40 35  ALT 37 37  ALKPHOS 43 41  BILITOT 0.7 0.7  PROT 6.5 6.4*  ALBUMIN 3.0* 3.0*   No results for input(s): "LIPASE", "AMYLASE" in the last 168 hours. No results for input(s): "AMMONIA" in the last 168 hours. CBC: Recent Labs  Lab 04/23/23 1840 04/24/23 0435 04/25/23 0350  WBC 7.9 6.6 6.8  NEUTROABS 4.7  --  3.9  HGB 13.3 13.1 13.1  HCT 40.3 41.8 42.2  MCV 96.2 99.3 99.5  PLT 173 201 209    Cardiac Enzymes: Recent Labs  Lab 04/23/23 2001  CKTOTAL 332   BNP: Invalid input(s): "POCBNP" CBG: Recent Labs  Lab 04/23/23 1950 04/23/23 2145  GLUCAP 103* 61*   D-Dimer No results for input(s): "DDIMER" in the last 72 hours. Hgb A1c No results for input(s): "HGBA1C" in the last 72 hours. Lipid Profile No results for input(s): "CHOL", "HDL", "LDLCALC", "TRIG", "CHOLHDL", "LDLDIRECT" in the last 72 hours. Thyroid function studies No results for input(s): "TSH", "T4TOTAL", "T3FREE", "THYROIDAB" in the last 72 hours.  Invalid input(s): "FREET3" Anemia work up No results for input(s): "VITAMINB12", "FOLATE", "FERRITIN", "TIBC", "IRON", "RETICCTPCT" in the last 72 hours. Urinalysis    Component Value Date/Time   COLORURINE YELLOW 09/07/2021 0826   APPEARANCEUR CLEAR 09/07/2021 0826   LABSPEC 1.007 09/07/2021 0826   PHURINE 5.0 09/07/2021 0826   GLUCOSEU >=500 (A) 09/07/2021 0826   HGBUR SMALL (A) 09/07/2021 0826   BILIRUBINUR NEGATIVE 09/07/2021 0826   KETONESUR 5 (A) 09/07/2021 0826   PROTEINUR 100 (A) 09/07/2021 0826   UROBILINOGEN 0.2 08/25/2013 0122   NITRITE NEGATIVE 09/07/2021 0826   LEUKOCYTESUR NEGATIVE 09/07/2021 0826   Sepsis Labs Recent Labs  Lab 04/23/23 1840 04/24/23 0435 04/25/23 0350  WBC 7.9 6.6 6.8   Microbiology Recent Results (from the past 240 hours)  Resp panel by RT-PCR (RSV, Flu A&B, Covid) Anterior Nasal Swab     Status: None   Collection Time: 04/23/23  7:01 PM   Specimen: Anterior Nasal Swab  Result Value Ref Range Status   SARS Coronavirus 2 by RT PCR NEGATIVE NEGATIVE Final    Comment: (NOTE) SARS-CoV-2 target nucleic acids are NOT DETECTED.  The SARS-CoV-2 RNA is generally detectable in upper respiratory specimens during the acute phase of infection. The lowest concentration of SARS-CoV-2 viral copies this assay can detect is 138 copies/mL. A negative result does not preclude SARS-Cov-2 infection and should not be used as  the sole basis for treatment or other patient management decisions. A negative result may occur with  improper specimen collection/handling, submission of specimen other than nasopharyngeal swab, presence of viral mutation(s) within the areas targeted by this assay, and inadequate number of viral copies(<138 copies/mL). A negative result must be combined with clinical observations, patient history, and epidemiological information. The expected result is Negative.  Fact Sheet for Patients:  BloggerCourse.com  Fact Sheet for Healthcare Providers:  SeriousBroker.it  This test is no t yet approved or cleared by the [place] FDA and  has been authorized for detection and/or diagnosis of SARS-CoV-2 by FDA under an Emergency Use Authorization (EUA). This EUA will remain  in effect (meaning this test can be used) for the duration of the COVID-19 declaration under Section 564(b)(1) of the Act, 21 U.S.C.section 360bbb-3(b)(1), unless the authorization is terminated  or revoked sooner.       Influenza A by PCR NEGATIVE NEGATIVE Final   Influenza B by PCR NEGATIVE NEGATIVE Final    Comment: (NOTE) The Xpert Xpress SARS-CoV-2/FLU/RSV plus assay is intended as an aid in the diagnosis of influenza from Nasopharyngeal swab specimens and should not be used as a sole basis for treatment. Nasal washings and aspirates are unacceptable for Xpert Xpress SARS-CoV-2/FLU/RSV testing.  Fact Sheet for Patients: BloggerCourse.com  Fact Sheet for Healthcare Providers: SeriousBroker.it  This test is not yet approved or cleared by the [place] FDA and has been authorized for detection and/or diagnosis of SARS-CoV-2 by FDA under an Emergency Use Authorization (EUA). This EUA will remain in effect (meaning this test can be used) for the duration of the COVID-19 declaration under Section 564(b)(1) of  the Act, 21 U.S.C. section 360bbb-3(b)(1), unless the authorization  is terminated or revoked.     Resp Syncytial Virus by PCR NEGATIVE NEGATIVE Final    Comment: (NOTE) Fact Sheet for Patients: BloggerCourse.com  Fact Sheet for Healthcare Providers: SeriousBroker.it  This test is not yet approved or cleared by the [place] FDA and has been authorized for detection and/or diagnosis of SARS-CoV-2 by FDA under an Emergency Use Authorization (EUA). This EUA will remain in effect (meaning this test can be used) for the duration of the COVID-19 declaration under Section 564(b)(1) of the Act, 21 U.S.C. section 360bbb-3(b)(1), unless the authorization is terminated or revoked.  Performed at [facility], [address], [place], [place] [address]    Time coordinating discharge:  40 mins   SIGNED:  Standley Dakins, MD  [facility] 04/25/2023, 9:55 AM How to contact the [facility] Attending or Consulting provider 7A - 7P or covering provider during after hours 7P -7A, for this patient?  Check the care team in [facility] and look for a) attending/consulting TRH provider listed and b) the [facility] team listed Log into [email or web address] and use [place]'s universal password to access. If you do not have the password, please contact the hospital operator. Locate the [facility] provider you are looking for under [facility] and page to a number that you can be directly reached. If you still have difficulty reaching the provider, please page the [facility] (Director on Call) for the Hospitalists listed on amion for assistance.

## 2023-04-25 NOTE — Progress Notes (Signed)
 Reviewed discharge with pt. Pt understanding of discharge instructions.

## 2023-04-25 NOTE — Progress Notes (Signed)
 Mobility Specialist Progress Note:    04/25/23 1010  Mobility  Activity Ambulated with assistance in room;Ambulated with assistance to bathroom  Level of Assistance Modified independent, requires aide device or extra time  Assistive Device None  Distance Ambulated (ft) 25 ft  Range of Motion/Exercises Active;All extremities  Activity Response Tolerated well  Mobility Referral Yes  Mobility visit 1 Mobility  Mobility Specialist Start Time (ACUTE ONLY) 0955  Mobility Specialist Stop Time (ACUTE ONLY) 1010  Mobility Specialist Time Calculation (min) (ACUTE ONLY) 15 min   Pt received in bed, requesting assistance to bathroom. ModI to stand and ambulate with no AD. Tolerated well, asx throughout. Left pt in bathroom, nursing staff in room. All needs met.   Lawerance Bach Mobility Specialist Please contact via Special educational needs teacher or  Rehab office at [phone]

## 2023-04-25 NOTE — Discharge Instructions (Signed)
IMPORTANT INFORMATION: PAY CLOSE ATTENTION   PHYSICIAN DISCHARGE INSTRUCTIONS  Follow with Primary care provider  Lazoff, Shawn P, DO  and other consultants as instructed by your Hospitalist Physician  SEEK MEDICAL CARE OR RETURN TO EMERGENCY ROOM IF SYMPTOMS COME BACK, WORSEN OR NEW PROBLEM DEVELOPS   Please note: You were cared for by a hospitalist during your hospital stay. Every effort will be made to forward records to your primary care provider.  You can request that your primary care provider send for your hospital records if they have not received them.  Once you are discharged, your primary care physician will handle any further medical issues. Please note that NO REFILLS for any discharge medications will be authorized once you are discharged, as it is imperative that you return to your primary care physician (or establish a relationship with a primary care physician if you do not have one) for your post hospital discharge needs so that they can reassess your need for medications and monitor your lab values.  Please get a complete blood count and chemistry panel checked by your Primary MD at your next visit, and again as instructed by your Primary MD.  Get Medicines reviewed and adjusted: Please take all your medications with you for your next visit with your Primary MD  Laboratory/radiological data: Please request your Primary MD to go over all hospital tests and procedure/radiological results at the follow up, please ask your primary care provider to get all Hospital records sent to his/her office.  In some cases, they will be blood work, cultures and biopsy results pending at the time of your discharge. Please request that your primary care provider follow up on these results.  If you are diabetic, please bring your blood sugar readings with you to your follow up appointment with primary care.    Please call and make your follow up appointments as soon as possible.    Also Note  the following: If you experience worsening of your admission symptoms, develop shortness of breath, life threatening emergency, suicidal or homicidal thoughts you must seek medical attention immediately by calling 911 or calling your MD immediately  if symptoms less severe.  You must read complete instructions/literature along with all the possible adverse reactions/side effects for all the Medicines you take and that have been prescribed to you. Take any new Medicines after you have completely understood and accpet all the possible adverse reactions/side effects.   Do not drive when taking Pain medications or sleeping medications (Benzodiazepines)  Do not take more than prescribed Pain, Sleep and Anxiety Medications. It is not advisable to combine anxiety,sleep and pain medications without talking with your primary care practitioner  Special Instructions: If you have smoked or chewed Tobacco  in the last 2 yrs please stop smoking, stop any regular Alcohol  and or any Recreational drug use.  Wear Seat belts while driving.  Do not drive if taking any narcotic, mind altering or controlled substances or recreational drugs or alcohol.       

## 2023-05-28 ENCOUNTER — Emergency Department (HOSPITAL_COMMUNITY)

## 2023-05-28 ENCOUNTER — Inpatient Hospital Stay (HOSPITAL_COMMUNITY)

## 2023-05-28 ENCOUNTER — Other Ambulatory Visit: Payer: Self-pay

## 2023-05-28 ENCOUNTER — Other Ambulatory Visit (HOSPITAL_COMMUNITY): Payer: Self-pay | Admitting: *Deleted

## 2023-05-28 ENCOUNTER — Encounter (HOSPITAL_COMMUNITY): Payer: Self-pay

## 2023-05-28 ENCOUNTER — Inpatient Hospital Stay (HOSPITAL_COMMUNITY)
Admission: EM | Admit: 2023-05-28 | Discharge: 2023-05-31 | DRG: 291 | Disposition: A | Discharge status: Home Health | Attending: Internal Medicine | Admitting: Internal Medicine

## 2023-05-28 DIAGNOSIS — E66812 Obesity, class 2: Secondary | ICD-10-CM | POA: Diagnosis present

## 2023-05-28 DIAGNOSIS — E119 Type 2 diabetes mellitus without complications: Secondary | ICD-10-CM | POA: Diagnosis present

## 2023-05-28 DIAGNOSIS — I4819 Other persistent atrial fibrillation: Secondary | ICD-10-CM | POA: Diagnosis not present

## 2023-05-28 DIAGNOSIS — E785 Hyperlipidemia, unspecified: Secondary | ICD-10-CM | POA: Diagnosis not present

## 2023-05-28 DIAGNOSIS — Z951 Presence of aortocoronary bypass graft: Secondary | ICD-10-CM | POA: Diagnosis not present

## 2023-05-28 DIAGNOSIS — Z91148 Patient's other noncompliance with medication regimen for other reason: Secondary | ICD-10-CM

## 2023-05-28 DIAGNOSIS — I4729 Other ventricular tachycardia: Secondary | ICD-10-CM | POA: Diagnosis present

## 2023-05-28 DIAGNOSIS — R55 Syncope and collapse: Secondary | ICD-10-CM | POA: Diagnosis not present

## 2023-05-28 DIAGNOSIS — Z95810 Presence of automatic (implantable) cardiac defibrillator: Secondary | ICD-10-CM | POA: Diagnosis not present

## 2023-05-28 DIAGNOSIS — I472 Ventricular tachycardia, unspecified: Secondary | ICD-10-CM | POA: Diagnosis not present

## 2023-05-28 DIAGNOSIS — R531 Weakness: Secondary | ICD-10-CM

## 2023-05-28 DIAGNOSIS — I482 Chronic atrial fibrillation, unspecified: Secondary | ICD-10-CM | POA: Diagnosis not present

## 2023-05-28 DIAGNOSIS — Z7984 Long term (current) use of oral hypoglycemic drugs: Secondary | ICD-10-CM | POA: Diagnosis not present

## 2023-05-28 DIAGNOSIS — I951 Orthostatic hypotension: Secondary | ICD-10-CM | POA: Diagnosis present

## 2023-05-28 DIAGNOSIS — Z66 Do not resuscitate: Secondary | ICD-10-CM | POA: Diagnosis not present

## 2023-05-28 DIAGNOSIS — H5461 Unqualified visual loss, right eye, normal vision left eye: Secondary | ICD-10-CM | POA: Diagnosis present

## 2023-05-28 DIAGNOSIS — D235 Other benign neoplasm of skin of trunk: Secondary | ICD-10-CM | POA: Diagnosis present

## 2023-05-28 DIAGNOSIS — Z9861 Coronary angioplasty status: Secondary | ICD-10-CM

## 2023-05-28 DIAGNOSIS — I110 Hypertensive heart disease with heart failure: Secondary | ICD-10-CM | POA: Diagnosis present

## 2023-05-28 DIAGNOSIS — D6869 Other thrombophilia: Secondary | ICD-10-CM | POA: Diagnosis present

## 2023-05-28 DIAGNOSIS — Z7901 Long term (current) use of anticoagulants: Secondary | ICD-10-CM | POA: Diagnosis not present

## 2023-05-28 DIAGNOSIS — I2510 Atherosclerotic heart disease of native coronary artery without angina pectoris: Secondary | ICD-10-CM | POA: Diagnosis present

## 2023-05-28 DIAGNOSIS — R4189 Other symptoms and signs involving cognitive functions and awareness: Secondary | ICD-10-CM | POA: Diagnosis present

## 2023-05-28 DIAGNOSIS — I509 Heart failure, unspecified: Principal | ICD-10-CM

## 2023-05-28 DIAGNOSIS — K5790 Diverticulosis of intestine, part unspecified, without perforation or abscess without bleeding: Secondary | ICD-10-CM | POA: Diagnosis present

## 2023-05-28 DIAGNOSIS — I493 Ventricular premature depolarization: Secondary | ICD-10-CM | POA: Diagnosis present

## 2023-05-28 DIAGNOSIS — K219 Gastro-esophageal reflux disease without esophagitis: Secondary | ICD-10-CM | POA: Diagnosis present

## 2023-05-28 DIAGNOSIS — Z885 Allergy status to narcotic agent status: Secondary | ICD-10-CM

## 2023-05-28 DIAGNOSIS — M545 Other chronic pain: Secondary | ICD-10-CM | POA: Diagnosis present

## 2023-05-28 DIAGNOSIS — I4821 Permanent atrial fibrillation: Secondary | ICD-10-CM | POA: Diagnosis present

## 2023-05-28 DIAGNOSIS — R413 Other amnesia: Secondary | ICD-10-CM | POA: Diagnosis present

## 2023-05-28 DIAGNOSIS — I252 Old myocardial infarction: Secondary | ICD-10-CM | POA: Diagnosis not present

## 2023-05-28 DIAGNOSIS — Z888 Allergy status to other drugs, medicaments and biological substances status: Secondary | ICD-10-CM

## 2023-05-28 DIAGNOSIS — Z6839 Body mass index (BMI) 39.0-39.9, adult: Secondary | ICD-10-CM

## 2023-05-28 DIAGNOSIS — I5021 Acute systolic (congestive) heart failure: Secondary | ICD-10-CM | POA: Diagnosis not present

## 2023-05-28 DIAGNOSIS — Z79891 Long term (current) use of opiate analgesic: Secondary | ICD-10-CM

## 2023-05-28 DIAGNOSIS — Z5971 Insufficient health insurance coverage: Secondary | ICD-10-CM

## 2023-05-28 DIAGNOSIS — Z8249 Family history of ischemic heart disease and other diseases of the circulatory system: Secondary | ICD-10-CM

## 2023-05-28 DIAGNOSIS — E7800 Pure hypercholesterolemia, unspecified: Secondary | ICD-10-CM | POA: Diagnosis present

## 2023-05-28 DIAGNOSIS — Z9181 History of falling: Secondary | ICD-10-CM

## 2023-05-28 DIAGNOSIS — D6859 Other primary thrombophilia: Secondary | ICD-10-CM | POA: Diagnosis present

## 2023-05-28 DIAGNOSIS — K5730 Diverticulosis of large intestine without perforation or abscess without bleeding: Secondary | ICD-10-CM | POA: Diagnosis present

## 2023-05-28 DIAGNOSIS — Z950 Presence of cardiac pacemaker: Secondary | ICD-10-CM | POA: Diagnosis present

## 2023-05-28 DIAGNOSIS — G8929 Other chronic pain: Secondary | ICD-10-CM | POA: Diagnosis present

## 2023-05-28 DIAGNOSIS — I10 Essential (primary) hypertension: Secondary | ICD-10-CM | POA: Diagnosis present

## 2023-05-28 DIAGNOSIS — J45909 Unspecified asthma, uncomplicated: Secondary | ICD-10-CM | POA: Diagnosis present

## 2023-05-28 DIAGNOSIS — I255 Ischemic cardiomyopathy: Secondary | ICD-10-CM | POA: Diagnosis present

## 2023-05-28 DIAGNOSIS — I5023 Acute on chronic systolic (congestive) heart failure: Secondary | ICD-10-CM | POA: Diagnosis present

## 2023-05-28 DIAGNOSIS — Z79899 Other long term (current) drug therapy: Secondary | ICD-10-CM

## 2023-05-28 DIAGNOSIS — F1910 Other psychoactive substance abuse, uncomplicated: Secondary | ICD-10-CM | POA: Diagnosis present

## 2023-05-28 LAB — COMPREHENSIVE METABOLIC PANEL WITH GFR
ALT: 22 U/L (ref 0–44)
AST: 31 U/L (ref 15–41)
Albumin: 3.5 g/dL (ref 3.5–5.0)
Alkaline Phosphatase: 51 U/L (ref 38–126)
Anion gap: 11 (ref 5–15)
BUN: 24 mg/dL — ABNORMAL HIGH (ref 8–23)
CO2: 23 mmol/L (ref 22–32)
Calcium: 8.5 mg/dL — ABNORMAL LOW (ref 8.9–10.3)
Chloride: 103 mmol/L (ref 98–111)
Creatinine, Ser: 1.23 mg/dL (ref 0.61–1.24)
GFR, Estimated: >60 mL/min (ref >60)
Glucose, Bld: 114 mg/dL — ABNORMAL HIGH (ref 70–99)
Potassium: 3.5 mmol/L (ref 3.5–5.1)
Sodium: 137 mmol/L (ref 135–145)
Total Bilirubin: 0.6 mg/dL (ref 0.0–1.2)
Total Protein: 7.1 g/dL (ref 6.5–8.1)

## 2023-05-28 LAB — URINALYSIS, ROUTINE W REFLEX MICROSCOPIC
Bacteria, UA: NONE SEEN
Bilirubin Urine: NEGATIVE
Glucose, UA: >=500 mg/dL — AB
Ketones, ur: NEGATIVE mg/dL
Leukocytes,Ua: NEGATIVE
Nitrite: NEGATIVE
Protein, ur: 30 mg/dL — AB
Specific Gravity, Urine: 1.018 (ref 1.005–1.030)
pH: 5 (ref 5.0–8.0)

## 2023-05-28 LAB — BRAIN NATRIURETIC PEPTIDE: B Natriuretic Peptide: 557 pg/mL — ABNORMAL HIGH (ref 0.0–100.0)

## 2023-05-28 LAB — CBC
HCT: 45.1 % (ref 39.0–52.0)
Hemoglobin: 14.7 g/dL (ref 13.0–17.0)
MCH: 31.8 pg (ref 26.0–34.0)
MCHC: 32.6 g/dL (ref 30.0–36.0)
MCV: 97.6 fL (ref 80.0–100.0)
Platelets: 211 10*3/uL (ref 150–400)
RBC: 4.62 MIL/uL (ref 4.22–5.81)
RDW: 16.3 % — ABNORMAL HIGH (ref 11.5–15.5)
WBC: 8.4 10*3/uL (ref 4.0–10.5)
nRBC: 0 % (ref 0.0–0.2)

## 2023-05-28 LAB — ECHOCARDIOGRAM COMPLETE
AR max vel: 2.32 cm2
AV Area VTI: 2.27 cm2
AV Area mean vel: 2.57 cm2
AV Mean grad: 3.2 mmHg
AV Peak grad: 6.8 mmHg
Ao pk vel: 1.3 m/s
Area-P 1/2: 4.31 cm2
Height: 69 in
S' Lateral: 4.5 cm
Weight: 4112 [oz_av]

## 2023-05-28 LAB — LIPASE, BLOOD: Lipase: 28 U/L (ref 11–51)

## 2023-05-28 LAB — HEMOGLOBIN A1C
Hgb A1c MFr Bld: 5.9 % — ABNORMAL HIGH (ref 4.8–5.6)
Mean Plasma Glucose: 122.63 mg/dL

## 2023-05-28 LAB — RAPID URINE DRUG SCREEN, HOSP PERFORMED
Amphetamines: NOT DETECTED
Barbiturates: NOT DETECTED
Benzodiazepines: NOT DETECTED
Cocaine: NOT DETECTED
Opiates: NOT DETECTED
Tetrahydrocannabinol: POSITIVE — AB

## 2023-05-28 LAB — MAGNESIUM: Magnesium: 1.3 mg/dL — ABNORMAL LOW (ref 1.7–2.4)

## 2023-05-28 LAB — TROPONIN I (HIGH SENSITIVITY)
Troponin I (High Sensitivity): 17 ng/L (ref <18)
Troponin I (High Sensitivity): 21 ng/L — ABNORMAL HIGH (ref <18)

## 2023-05-28 MED ORDER — FUROSEMIDE 10 MG/ML IJ SOLN
20.0000 mg | Freq: Once | INTRAMUSCULAR | Status: AC
  Administered 2023-05-28: 20 mg via INTRAVENOUS
  Filled 2023-05-28: qty 2

## 2023-05-28 MED ORDER — METOPROLOL SUCCINATE ER 50 MG PO TB24
50.0000 mg | ORAL_TABLET | Freq: Every day | ORAL | Status: DC
Start: 2023-05-28 — End: 2023-05-29
  Administered 2023-05-28: 50 mg via ORAL
  Filled 2023-05-28 (×2): qty 1

## 2023-05-28 MED ORDER — SACUBITRIL-VALSARTAN 97-103 MG PO TABS
1.0000 | ORAL_TABLET | Freq: Two times a day (BID) | ORAL | Status: DC
Start: 2023-05-28 — End: 2023-05-29
  Administered 2023-05-28 – 2023-05-29 (×3): 1 via ORAL
  Filled 2023-05-28 (×3): qty 1

## 2023-05-28 MED ORDER — LORATADINE 10 MG PO TABS: 10.0000 mg | ORAL_TABLET | Freq: Every day | ORAL | Status: DC

## 2023-05-28 MED ORDER — OXYCODONE HCL 5 MG PO TABS
5.0000 mg | ORAL_TABLET | Freq: Four times a day (QID) | ORAL | Status: DC | PRN
  Administered 2023-05-30: 5 mg via ORAL
  Filled 2023-05-28: qty 1

## 2023-05-28 MED ORDER — FLUTICASONE PROPIONATE 50 MCG/ACT NA SUSP
1.0000 | Freq: Every day | NASAL | Status: DC
  Administered 2023-05-28 – 2023-05-31 (×4): 1 via NASAL
  Filled 2023-05-28: qty 16

## 2023-05-28 MED ORDER — ALPRAZOLAM 0.5 MG PO TABS: 1.0000 mg | ORAL_TABLET | Freq: Two times a day (BID) | ORAL | Status: DC | PRN

## 2023-05-28 MED ORDER — PANTOPRAZOLE SODIUM 40 MG PO TBEC
40.0000 mg | DELAYED_RELEASE_TABLET | Freq: Two times a day (BID) | ORAL | Status: DC
  Administered 2023-05-28 – 2023-05-31 (×7): 40 mg via ORAL
  Filled 2023-05-28 (×7): qty 1

## 2023-05-28 MED ORDER — ALUM & MAG HYDROXIDE-SIMETH 200-200-20 MG/5ML PO SUSP
30.0000 mL | Freq: Once | ORAL | Status: AC
  Administered 2023-05-28: 30 mL via ORAL
  Filled 2023-05-28: qty 30

## 2023-05-28 MED ORDER — ROSUVASTATIN CALCIUM 20 MG PO TABS
40.0000 mg | ORAL_TABLET | Freq: Every evening | ORAL | Status: DC
  Administered 2023-05-28 – 2023-05-30 (×3): 40 mg via ORAL
  Filled 2023-05-28 (×3): qty 2

## 2023-05-28 MED ORDER — ACETAMINOPHEN 325 MG PO TABS
650.0000 mg | ORAL_TABLET | ORAL | Status: DC | PRN
  Administered 2023-05-31: 650 mg via ORAL
  Filled 2023-05-28: qty 2

## 2023-05-28 MED ORDER — TRAZODONE HCL 50 MG PO TABS
50.0000 mg | ORAL_TABLET | Freq: Every evening | ORAL | Status: DC | PRN
  Administered 2023-05-28 – 2023-05-30 (×2): 50 mg via ORAL
  Filled 2023-05-28 (×2): qty 1

## 2023-05-28 MED ORDER — ALPRAZOLAM 1 MG PO TABS
1.0000 mg | ORAL_TABLET | Freq: Two times a day (BID) | ORAL | Status: DC | PRN
  Administered 2023-05-28 – 2023-05-31 (×3): 1 mg via ORAL
  Filled 2023-05-28 (×3): qty 1

## 2023-05-28 MED ORDER — OXYCODONE-ACETAMINOPHEN 5-325 MG PO TABS
1.0000 | ORAL_TABLET | Freq: Four times a day (QID) | ORAL | Status: DC | PRN
  Administered 2023-05-28 – 2023-05-30 (×3): 1 via ORAL
  Filled 2023-05-28 (×3): qty 1

## 2023-05-28 MED ORDER — TIZANIDINE HCL 4 MG PO TABS
4.0000 mg | ORAL_TABLET | Freq: Three times a day (TID) | ORAL | Status: DC | PRN
Start: 2023-05-28 — End: 2023-05-31
  Administered 2023-05-28 – 2023-05-31 (×4): 4 mg via ORAL
  Filled 2023-05-28 (×4): qty 1

## 2023-05-28 MED ORDER — PANTOPRAZOLE SODIUM 40 MG PO TBEC: 40.0000 mg | DELAYED_RELEASE_TABLET | Freq: Every day | ORAL | Status: DC

## 2023-05-28 MED ORDER — FUROSEMIDE 10 MG/ML IJ SOLN
40.0000 mg | Freq: Two times a day (BID) | INTRAMUSCULAR | Status: DC
  Administered 2023-05-28: 40 mg via INTRAVENOUS
  Filled 2023-05-28 (×2): qty 4

## 2023-05-28 MED ORDER — SODIUM CHLORIDE 0.9 % IV SOLN: 250.0000 mL | INTRAVENOUS | Status: AC | PRN

## 2023-05-28 MED ORDER — LORATADINE 10 MG PO TABS
10.0000 mg | ORAL_TABLET | Freq: Every day | ORAL | Status: DC
  Administered 2023-05-28 – 2023-05-30 (×3): 10 mg via ORAL
  Filled 2023-05-28 (×3): qty 1

## 2023-05-28 MED ORDER — SODIUM CHLORIDE 0.9% FLUSH: 3.0000 mL | INTRAVENOUS | Status: DC | PRN

## 2023-05-28 MED ORDER — EMPAGLIFLOZIN 10 MG PO TABS
10.0000 mg | ORAL_TABLET | Freq: Every day | ORAL | Status: DC
  Administered 2023-05-28 – 2023-05-31 (×4): 10 mg via ORAL
  Filled 2023-05-28 (×6): qty 1

## 2023-05-28 MED ORDER — MUPIROCIN 2 % EX OINT
TOPICAL_OINTMENT | Freq: Two times a day (BID) | CUTANEOUS | Status: AC
  Administered 2023-05-29: 1 via TOPICAL
  Filled 2023-05-28: qty 22

## 2023-05-28 MED ORDER — SODIUM CHLORIDE 0.9% FLUSH
3.0000 mL | Freq: Two times a day (BID) | INTRAVENOUS | Status: DC
  Administered 2023-05-28 – 2023-05-31 (×7): 3 mL via INTRAVENOUS

## 2023-05-28 MED ORDER — SPIRONOLACTONE 25 MG PO TABS
25.0000 mg | ORAL_TABLET | Freq: Every day | ORAL | Status: DC
  Administered 2023-05-28 – 2023-05-29 (×2): 25 mg via ORAL
  Filled 2023-05-28 (×2): qty 1

## 2023-05-28 MED ORDER — APIXABAN 5 MG PO TABS
5.0000 mg | ORAL_TABLET | Freq: Two times a day (BID) | ORAL | Status: DC
  Administered 2023-05-28 – 2023-05-31 (×7): 5 mg via ORAL
  Filled 2023-05-28 (×7): qty 1

## 2023-05-28 MED ORDER — EZETIMIBE 10 MG PO TABS
10.0000 mg | ORAL_TABLET | Freq: Every day | ORAL | Status: DC
  Administered 2023-05-28 – 2023-05-31 (×4): 10 mg via ORAL
  Filled 2023-05-28 (×4): qty 1

## 2023-05-28 MED ORDER — MAGNESIUM SULFATE 4 GM/100ML IV SOLN
4.0000 g | Freq: Once | INTRAVENOUS | Status: AC
  Administered 2023-05-28: 4 g via INTRAVENOUS
  Filled 2023-05-28: qty 100

## 2023-05-28 MED ORDER — FUROSEMIDE 10 MG/ML IJ SOLN
20.0000 mg | Freq: Once | INTRAMUSCULAR | Status: DC
  Filled 2023-05-28: qty 2

## 2023-05-28 MED ORDER — CITALOPRAM HYDROBROMIDE 20 MG PO TABS
20.0000 mg | ORAL_TABLET | Freq: Every day | ORAL | Status: DC
  Administered 2023-05-28 – 2023-05-31 (×4): 20 mg via ORAL
  Filled 2023-05-28 (×4): qty 1

## 2023-05-28 MED ORDER — OXYCODONE-ACETAMINOPHEN 10-325 MG PO TABS: 1.0000 | ORAL_TABLET | Freq: Four times a day (QID) | ORAL | Status: DC | PRN

## 2023-05-28 NOTE — Plan of Care (Signed)
   Problem: Activity: Goal: Risk for activity intolerance will decrease Outcome: Progressing   Problem: Coping: Goal: Level of anxiety will decrease Outcome: Progressing

## 2023-05-28 NOTE — Progress Notes (Signed)
*  PRELIMINARY RESULTS* Echocardiogram 2D Echocardiogram has been performed with Definity.  Stacey Drain 05/28/2023, 3:23 PM

## 2023-05-28 NOTE — ED Notes (Addendum)
 Medtronic interrogation not working. This RN and another RN attempted interrogation. Medtronic help line called with no answer.

## 2023-05-28 NOTE — Hospital Course (Signed)
 68 year old male with chronic systolic heart failure (low EF 20%), status post ICD placement, history of V. tach, anxiety disorder, CAD, asthma, chronic pain, opioid dependent, GERD, chronic atrial fibrillation on apixaban, hyperlipidemia presented to the emergency department early this morning complaining of shortness of breath and weakness.  He reported that his shortness of breath came on acutely and worsened over the past few hours prior to arrival.  He feels diaphoretic.  He briefly felt like he was going to pass out but did not.  He has acid reflux and chest discomfort symptoms.  His ED workup was significant for an elevated BNP and pulmonary edema noted on chest x-ray.  He had a brief episode of nonsustained V. tach in the ED.  His ICD did not activate.  He was given IV Lasix and has started diuresing.  Hospital admission was requested for management of acute heart failure and near syncope.

## 2023-05-28 NOTE — Progress Notes (Signed)
 Pt remains A&O x4 but still with confused and rambling conversation and is increasingly restless, keeps getting OOB and out of chair because he states he needs to pee but then is unable to pass any urine. Bladder scan completed,  it shows 230-250 ml consistently. MD Laural Benes notified of current pt condition and request made for I&O cath.

## 2023-05-28 NOTE — Progress Notes (Signed)
 05/28/2023 6:29 PM  Message sent to pharm D on call that home meds still need to be reconciled for admission orders.    Maryln Manuel MD

## 2023-05-28 NOTE — TOC Initial Note (Signed)
 Transition of Care [facility]) - Initial/Assessment Note    Patient Details  Name: Douglas [name] MRN: [id number] Date of Birth: April 28, 1955  [facility]) CM/SW Contact:    Leitha Bleak, RN Phone Number: 05/28/2023, 3:08 PM  Clinical Narrative:     Patient admitted with Congestive heart failure. Patient is confused, Per chart he lives alone with a dog, sister is his support. TOC consulted for CHF. Education added to AVS. TOC will follow to offer [facility] for education.               Expected Discharge Plan: Home/Self Care Barriers to Discharge: Continued Medical Work up   Patient Goals and CMS Choice Patient states their goals for this hospitalization and ongoing recovery are:: return home CMS Medicare.gov Compare Post Acute Care list provided to:: Patient        Expected Discharge Plan and Services       Living arrangements for the past 2 months: Single Family Home                                      Prior Living Arrangements/Services Living arrangements for the past 2 months: Single Family Home Lives with:: Self Patient language and need for interpreter reviewed:: Yes        Need for Family Participation in Patient Care: Yes (Comment) Care giver support system in place?: Yes (comment)   Criminal Activity/Legal Involvement Pertinent to Current Situation/Hospitalization: No - Comment as needed  Activities of Daily Living   ADL Screening (condition at time of admission) Independently performs ADLs?: Yes (appropriate for developmental age) Is the patient deaf or have difficulty hearing?: No Does the patient have difficulty seeing, even when wearing glasses/contacts?: No Does the patient have difficulty concentrating, remembering, or making decisions?: No  Permission Sought/Granted      Emotional Assessment     Affect (typically observed): Accepting Orientation: : Oriented to Self, Oriented to Place, Oriented to  Time, Oriented to Situation    Psych Involvement: No (comment)  Admission diagnosis:  Nonsustained ventricular tachycardia (HCC) [I47.29] Near syncope [R55] Acute on chronic systolic (congestive) heart failure (HCC) [I50.23] Acute on chronic congestive heart failure, unspecified heart failure type [facility]) [I50.9] Patient Active Problem List   Diagnosis Date Noted   Acute on chronic systolic (congestive) heart failure (HCC) 05/28/2023   Acquired thrombophilia (HCC) 05/28/2023   Fall at home 04/23/2023   Generalized weakness 04/23/2023   Prolonged QT interval 04/23/2023   AKI (acute kidney injury) (HCC) 04/23/2023   Scalp hematoma 04/23/2023   Hypoalbuminemia due to protein-calorie malnutrition (HCC) 04/23/2023   Chronic systolic CHF (congestive heart failure) (HCC) 04/23/2023   Anxiety 04/23/2023   Multiple trauma 06/02/2017   Acute on chronic combined systolic and diastolic CHF (congestive heart failure) (HCC) 06/02/2017   Loss of consciousness (HCC) 06/02/2017   Acute on chronic respiratory failure with hypoxia (HCC) 06/02/2017   Multiple closed fractures of ribs of right side 06/02/2017   Contusion of right lung without open wound into thorax 06/02/2017   Leukocytosis 06/02/2017   Elevated random blood glucose level 06/02/2017   Coronary artery disease 06/02/2017   Pacemaker 06/02/2017   Diverticulosis 06/02/2017   Closed L1 vertebral fracture (HCC) 06/02/2017   Closed L2 vertebral fracture (HCC) 06/02/2017   Vertebral compression fracture (HCC) 06/02/2017   Closed fracture of right orbital floor (HCC) 06/02/2017   Right orbit fracture, closed, initial  encounter (HCC) 06/02/2017   Rupture of globe of eye following blunt trauma, right, initial encounter 06/02/2017   Contusion of face 06/02/2017   Spondylosis of cervical spine 06/02/2017   Persistent atrial fibrillation (HCC) 06/02/2017   Coagulopathy (HCC) 06/02/2017   Ganglion cyst of dorsum of right wrist 11/04/2015   Polypharmacy 11/04/2015   Allergic  rhinitis 12/27/2014   Asthma 12/27/2014   Chronic low back pain 12/27/2014   Chronic pain 12/27/2014   Depression 12/27/2014   Gastroesophageal reflux disease without esophagitis 12/27/2014   Generalized anxiety disorder 12/27/2014   Impaired fasting glucose 12/27/2014   Insomnia 12/27/2014   Right shoulder pain 12/27/2014   Nausea 12/27/2014   Class 2 obesity due to excess calories in adult 12/27/2014   Hyperlipidemia 12/27/2014   DJD (degenerative joint disease), lumbosacral 08/26/2013   Seizure (HCC) 08/25/2013   Atrial fibrillation, chronic (HCC) 08/25/2013   History of Substance abuse 08/25/2013   Ventricular tachycardia (HCC) 05/23/2011   Coronary arteriosclerosis    Dyslipidemia 05/01/2010   MORBID OBESITY 05/01/2010   Essential hypertension 05/01/2010   Cardiomyopathy, ischemic 05/01/2010   Automatic implantable cardioverter-defibrillator in situ 05/01/2010   PCP:  Mattie Marlin, DO Pharmacy:   [facility] [phone] - [place], [place] - [address] AT [facility] [address] [place] [address] Phone: [phone] Fax: [phone]  [facility] - [place], [place] - [address] [place] [address] Phone: [phone] Fax: [phone]  [facility] - [place], [place] - [address] [place] [address] Phone: [phone] Fax: [phone]  [id number] - [place], [place] - [address] AT SEC OF [address] & [address]  [place] [address] Phone: [phone] Fax: [phone]    Social Drivers of Health (SDOH) Social History: SDOH Screenings   Food Insecurity: Food Insecurity Present (05/28/2023)  Housing: Low Risk  (05/28/2023)  Transportation Needs: No Transportation Needs (05/28/2023)  Utilities: Not At Risk (05/28/2023)  Social Connections: Moderately Isolated (05/28/2023)  Tobacco Use: Low Risk  (05/28/2023)   SDOH  Interventions:    Readmission Risk Interventions     No data to display

## 2023-05-28 NOTE — Progress Notes (Signed)
 Belongings (wallet, cards, cash, car & house keys) sent home with pt's sister Douglas [name] per pt's permission. Engineer, materials released belongings per policy.

## 2023-05-28 NOTE — Progress Notes (Signed)
 Pt has been getting up and down to bathroom multiple times without calling for assistance, (sets bed alarm off each time) despite multiple reminders not to get up without help due to his issues with balance. Pt has urinated three times and had one BM. Pt accidentally pulled IV out while in bathroom attempting to wipe himself. Pt assisted back to recliner for lunch. Chair alarm on for safety. Call bell within reach. Reminded to cal for assistance, states understanding.  Pt is alert and oriented x4 but with rambling conversation. Talks over staff when staff is attempting to assess or ask pertinent health related questions. Does not respond to questions with a clear answer, just starts telling stories. Pt keeps talking about "the seizure" he had down in the ED this am but denies any history of seizure and no seizure activity noted since admission. Pt also fixated on reported interaction with night shift ED nurse in which he feels she was flirting with him and complaining about his visit to [facility] ED yesterday. He says he was locked in a little room on a baby bed with nothing but a sheet, then sent home by EMS and placed on the floor of his home. Advised pt to call EMS management & patient representative of [facility] if he is unhappy with the care provided by these two entities. Pt states understanding. Offered to have [facility] ED manager come speak with him regarding ED nurse last pm but pt declined.

## 2023-05-28 NOTE — Progress Notes (Signed)
 I&O cath performed per MD order, 300 ml of clear, dark yellow urine drained from bladder. Pt states. "That feels better." Pericare performed before and after cath. Pt assisted up into recliner for comfort. Chair alarm on, fall mat in place, call bell within reach. Advised to call for needs. Pt states understanding.

## 2023-05-28 NOTE — ED Triage Notes (Signed)
 Pt c/o being short winded and weak.

## 2023-05-28 NOTE — Progress Notes (Signed)
 Pt continues to attempt to get out of chair without assistance. Restless, c/o legs "jumping" and can't get comfortable in bed or chair. Xanaflex given per order for relief. Pt again feels urge to void post lasix administration, one assist to BR and pt voided 700 ml of clear yellow urine. A&O x4 but continues with confused conversation. Cooperative with physical redirection and repositioning. MD notified of pt's continued restlessness, impulsiveness with getting up without calling for help and continued confusion. Order received for 1:1 safety sitter.
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
## 2023-05-28 NOTE — Progress Notes (Addendum)
   05/28/23 1103  Spiritual Encounters  Type of Visit Initial  Care provided to: Patient  Referral source Other (comment) (Spiritual Consult)  Reason for visit Advance directives  OnCall Visit No  Mental Health Advance Directives  Would patient like information on creating a mental health advance directive? No - Patient declined   Chaplain responded to a spiritual consult request for advanced directive paperwork.  I met with Douglas [name], he did not recall requesting the paperwork and this time did not wish to consider completing an advanced directive.   Valerie Roys [facility]  [phone]

## 2023-05-28 NOTE — ED Provider Notes (Signed)
 AP-EMERGENCY DEPT [facility] Emergency Department Provider Note MRN:  [id number]  Arrival date & time: 05/28/23     Chief Complaint   Shortness of breath History of Present Illness   Douglas [name] is a 68 y.o. year-old male with a history of CAD, CHF, diabetes, A-fib presenting to the ED with chief complaint of shortness of breath.  Acute shortness of breath and generalized weakness this evening, worsening over the past few hours.  Feeling short winded.  Feeling diaphoretic.  Felt like he was going to pass out briefly.  Endorsing chest pain described as heartburn.  Denies abdominal pain.  Review of Systems  A thorough review of systems was obtained and all systems are negative except as noted in the HPI and PMH.   Patient's Health History    Past Medical History:  Diagnosis Date   A-fib [facility])    AICD (automatic cardioverter/defibrillator) present    Anemia    Asthma    CAD (coronary artery disease)    CHF (congestive heart failure) (HCC)    Diabetes mellitus without complication (HCC)    Dyslipidemia    Elevated cholesterol    Hypertension     Past Surgical History:  Procedure Laterality Date   CARDIAC CATHETERIZATION  1996   stent placement   CARDIAC DEFIBRILLATOR PLACEMENT     CORONARY ARTERY BYPASS GRAFT  2002   times 4   CORONARY ARTERY BYPASS GRAFT     EYE SURGERY     ICD GENERATOR CHANGEOUT N/A 09/30/2018   Procedure: ICD GENERATOR CHANGEOUT;  Surgeon: Marinus Maw, MD;  Location: [facility] INVASIVE CV LAB;  Service: Cardiovascular;  Laterality: N/A;   IMPLANTABLE CARDIOVERTER DEFIBRILLATOR (ICD) GENERATOR CHANGE N/A 09/10/2011   Procedure: ICD GENERATOR CHANGE;  Surgeon: Marinus Maw, MD;  Location: [facility] CATH LAB;  Service: Cardiovascular;  Laterality: N/A;   INSERT / REPLACE / REMOVE PACEMAKER     LEFT HEART CATHETERIZATION WITH CORONARY ANGIOGRAM N/A 08/29/2011   Procedure: LEFT HEART CATHETERIZATION WITH CORONARY ANGIOGRAM;  Surgeon: Peter M Swaziland, MD;   Location: [facility] CATH LAB;  Service: Cardiovascular;  Laterality: N/A;    Family History  Problem Relation Age of Onset   Heart attack Father     Social History   Socioeconomic History   Marital status: Divorced    Spouse name: Not on file   Number of children: Not on file   Years of education: Not on file   Highest education level: Not on file  Occupational History   Not on file  Tobacco Use   Smoking status: Never   Smokeless tobacco: Never  Vaping Use   Vaping status: Never Used  Substance and Sexual Activity   Alcohol use: Never   Drug use: Never    Comment: marijuana   Sexual activity: Not on file  Other Topics Concern   Not on file  Social History Narrative   ** Merged History Encounter **       Social Drivers of Health   Financial Resource Strain: Not on file  Food Insecurity: No Food Insecurity (04/24/2023)   Hunger Vital Sign    Worried About Running Out of Food in the Last Year: Never true    Ran Out of Food in the Last Year: Never true  Transportation Needs: No Transportation Needs (04/24/2023)   PRAPARE - Administrator, Civil Service (Medical): No    Lack of Transportation (Non-Medical): No  Physical Activity: Not on file  Stress: Not  on file  Social Connections: Unknown (04/24/2023)   Social Connection and Isolation Panel [NHANES]    Frequency of Communication with Friends and Family: Never    Frequency of Social Gatherings with Friends and Family: Once a week    Attends Religious Services: More than 4 times per year    Active Member of [employer] or Organizations: No    Attends Banker Meetings: Never    Marital Status: Patient declined  Catering manager Violence: Not At Risk (04/24/2023)   Humiliation, Afraid, Rape, and Kick questionnaire    Fear of Current or Ex-Partner: No    Emotionally Abused: No    Physically Abused: No    Sexually Abused: No     Physical Exam   Vitals:   05/28/23 0250 05/28/23 0355  BP:    Pulse: 96 99   Resp: (!) 24   Temp:    SpO2: 92% 95%    CONSTITUTIONAL: Chronically ill-appearing, NAD NEURO/PSYCH:  Alert and oriented x 3, no focal deficits EYES: 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, left eye with normal extraocular movements ENT/NECK:  no LAD, no JVD CARDIO: Regular rate, well-perfused, normal S1 and S2 PULM:  CTAB no wheezing or rhonchi GI/GU:  non-distended, non-tender MSK/SPINE:  No gross deformities, no edema SKIN:  no rash, atraumatic   *Additional and/or pertinent findings included in MDM below  Diagnostic and Interventional Summary    EKG Interpretation Date/Time:  Tuesday May 28 2023 02:45:10 EDT Ventricular Rate:  93 PR Interval:    QRS Duration:  108 QT Interval:  328 QTC Calculation: 408 R Axis:   90  Text Interpretation: Atrial fibrillation Multiple ventricular premature complexes Anterior infarct, old Borderline repolarization abnormality Confirmed by Kennis Carina [phone]) on 05/28/2023 3:04:57 AM       Labs Reviewed  CBC - Abnormal; Notable for the following components:      Result Value   RDW 16.3 (*)    All other components within normal limits  COMPREHENSIVE METABOLIC PANEL WITH GFR - Abnormal; Notable for the following components:   Glucose, Bld 114 (*)    BUN 24 (*)    Calcium 8.5 (*)    All other components within normal limits  BRAIN NATRIURETIC PEPTIDE - Abnormal; Notable for the following components:   B Natriuretic Peptide 557.0 (*)    All other components within normal limits  MAGNESIUM - Abnormal; Notable for the following components:   Magnesium 1.3 (*)    All other components within normal limits  LIPASE, BLOOD  TROPONIN I (HIGH SENSITIVITY)  TROPONIN I (HIGH SENSITIVITY)    DG Chest Port 1 View  Final Result      Medications  alum & mag hydroxide-simeth (MAALOX/MYLANTA) 200-200-20 MG/5ML suspension 30 mL (30 mLs Oral Given 05/28/23 0346)  furosemide (LASIX) injection 20 mg (20 mg Intravenous Given 05/28/23 0417)     Procedures  /   Critical Care Procedures  ED Course and Medical Decision Making  Initial Impression and Ddx Patient presenting with shortness of breath, near syncopal episode, diaphoresis.  Broad differential including CHF exacerbation, ACS, PE felt to be unlikely given patient's anticoagulated status.  Chronic A-fib.  Reassuring vital signs, mildly tachypneic.  Past medical/surgical history that increases complexity of ED encounter: A-fib, CHF, CAD  Interpretation of Diagnostics I personally reviewed the EKG and my interpretation is as follows: A-fib  No significant blood count or electrolyte disturbance.  BNP elevation  Patient Reassessment and Ultimate Disposition/Management     Given the  brief episode of near syncope patient's pacemaker was interrogated, patient did have an episode of nonsustained ventricular tachycardia at 1:46 AM.  No action taken by the device.  Given this and the evidence of mild CHF and the fact the patient is not responding to IV Lasix will request hospitalist admission.  Patient management required discussion with the following services or consulting groups:  Hospitalist Service  Complexity of Problems Addressed Acute illness or injury that poses threat of life of bodily function  Additional Data Reviewed and Analyzed Further history obtained from: Prior labs/imaging results  Additional Factors Impacting ED Encounter Risk Consideration of hospitalization  Elmer Sow. Pilar Plate, MD [facility] Emergency Medicine [facility] [email or web address]  Final Clinical Impressions(s) / ED Diagnoses     ICD-10-CM   1. Acute on chronic congestive heart failure, unspecified heart failure type (HCC)  I50.9     2. Nonsustained ventricular tachycardia (HCC)  I47.29     3. Near syncope  R55       ED Discharge Orders     None        Discharge Instructions Discussed with and Provided to Patient:   Discharge Instructions   None      Sabas Sous,  MD 05/28/23 [phone]

## 2023-05-28 NOTE — Progress Notes (Signed)
 Pt's two sisters here to visit pt. Pt recognizes both of them. He continues to talk about going to [facility] last night, etc. Sisters both attempting to reorient pt but not successful.   Pt now has 1:1 Recruitment consultant for safety. Chair alarm on, call bell within reach.

## 2023-05-28 NOTE — H&P (Signed)
 History and Physical  [facility]  Douglas [name] ZOX:[id number] DOB: April 10, 1955 DOA: 05/28/2023  PCP: Mattie Marlin, DO  Patient coming from: Home  Level of care: Telemetry  I have personally briefly reviewed patient's old medical records in [facility]  Chief Complaint: short winded  HPI: Douglas [name] is a 68 year old male with chronic systolic heart failure (low EF 20%), status post ICD placement, history of V. tach, anxiety disorder, CAD, asthma, chronic pain, opioid dependent, GERD, chronic atrial fibrillation on apixaban, hyperlipidemia presented to the emergency department early this morning complaining of shortness of breath and weakness.  He reported that his shortness of breath came on acutely and worsened over the past few hours prior to arrival.  He feels diaphoretic.  He briefly felt like he was going to pass out but did not.  He has acid reflux and chest discomfort symptoms.  His ED workup was significant for an elevated BNP and pulmonary edema noted on chest x-ray.  He had a brief episode of nonsustained V. tach in the ED.  His ICD did not activate.  He was given IV Lasix and has started diuresing.  Hospital admission was requested for management of acute heart failure and near syncope.   Past Medical History:  Diagnosis Date   A-fib [facility])    AICD (automatic cardioverter/defibrillator) present    Anemia    Asthma    CAD (coronary artery disease)    CHF (congestive heart failure) (HCC)    Diabetes mellitus without complication (HCC)    Dyslipidemia    Elevated cholesterol    Hypertension     Past Surgical History:  Procedure Laterality Date   CARDIAC CATHETERIZATION  1996   stent placement   CARDIAC DEFIBRILLATOR PLACEMENT     CORONARY ARTERY BYPASS GRAFT  2002   times 4   CORONARY ARTERY BYPASS GRAFT     EYE SURGERY     ICD GENERATOR CHANGEOUT N/A 09/30/2018   Procedure: ICD GENERATOR CHANGEOUT;  Surgeon: Marinus Maw, MD;  Location: [facility] INVASIVE  CV LAB;  Service: Cardiovascular;  Laterality: N/A;   IMPLANTABLE CARDIOVERTER DEFIBRILLATOR (ICD) GENERATOR CHANGE N/A 09/10/2011   Procedure: ICD GENERATOR CHANGE;  Surgeon: Marinus Maw, MD;  Location: [facility] CATH LAB;  Service: Cardiovascular;  Laterality: N/A;   INSERT / REPLACE / REMOVE PACEMAKER     LEFT HEART CATHETERIZATION WITH CORONARY ANGIOGRAM N/A 08/29/2011   Procedure: LEFT HEART CATHETERIZATION WITH CORONARY ANGIOGRAM;  Surgeon: Peter M Swaziland, MD;  Location: [facility] CATH LAB;  Service: Cardiovascular;  Laterality: N/A;     reports that he has never smoked. He has never used smokeless tobacco. He reports that he does not drink alcohol and does not use drugs.  Allergies  Allergen Reactions   Codeine Hives and Itching   Lasix [Furosemide] Other (See Comments)    Patient says he gets light headed and dizzy.    Xarelto [Rivaroxaban] Other (See Comments)    Bleeding ulcers   Vioxx [Rofecoxib] Palpitations and Other (See Comments)    Caused massive heart attack    Family History  Problem Relation Age of Onset   Heart attack Father     Prior to Admission medications   Medication Sig Start Date End Date Taking? Authorizing Provider  ALPRAZolam Prudy Feeler) 1 MG tablet Take 1-2 mg by mouth 2 (two) times daily as needed for anxiety. 06/27/21   [provider]  Carboxymethylcellulose Sodium (ARTIFICIAL TEARS OP) Place 1 drop into the  right eye 3 (three) times daily.    [provider]  citalopram (CELEXA) 20 MG tablet Take 20 mg by mouth daily. 04/05/17   [provider]  ELIQUIS 5 MG TABS tablet TAKE ONE TABLET BY MOUTH TWICE DAILY Patient taking differently: Take 5 mg by mouth 2 (two) times daily. 08/28/18   [name], MD  ENTRESTO 97-103 MG Take 1 tablet by mouth 2 (two) times daily. 08/30/21   [provider]  ezetimibe (ZETIA) 10 MG tablet Take 10 mg by mouth daily. 06/01/21   [provider]  fluticasone (FLONASE) 50 MCG/ACT nasal spray Place 1  spray into both nostrils daily.    [provider]  JARDIANCE 10 MG TABS tablet Take 10 mg by mouth daily. 06/27/21   [provider]  lansoprazole (PREVACID) 30 MG capsule Take 30 mg by mouth daily.      [provider]  loratadine (CLARITIN) 10 MG tablet Take 10 mg by mouth daily.    [provider]  metoprolol succinate (TOPROL-XL) 100 MG 24 hr tablet Take 0.5 tablets (50 mg total) by mouth daily. Take with or immediately following a meal. 04/25/23   Oprah Camarena L, MD  Multiple Vitamin (MULTIVITAMIN WITH MINERALS) TABS Take 1 tablet by mouth daily. And balance of nature    [provider]  Omega-3 Fatty Acids (FISH OIL) 1200 MG CAPS Take 1,200 mg by mouth daily.    [provider]  oxyCODONE-acetaminophen (PERCOCET) 10-325 MG per tablet Take 1 tablet by mouth every 6 (six) hours as needed for pain.     [provider]  rosuvastatin (CRESTOR) 40 MG tablet TAKE ONE (1) TABLET EACH DAY 10/18/20   [name], MD  spironolactone (ALDACTONE) 25 MG tablet TAKE ONE (1) TABLET EACH DAY 06/07/20   [name], MD  tiZANidine (ZANAFLEX) 4 MG tablet Take 4 mg by mouth every 8 (eight) hours as needed for muscle spasms. 09/29/11   [provider]  torsemide (DEMADEX) 20 MG tablet TAKE ONE (1) TABLET EACH DAY 10/15/19   [name], MD    Physical Exam: Vitals:   05/28/23 0645 05/28/23 0700 05/28/23 0715 05/28/23 0730  BP: 103/88 114/70 111/73 113/80  Pulse: 93 95 (!) 101 94  Resp:      Temp:      SpO2: 93%  93% 92%    Constitutional: chronically ill appearing male; NAD, calm, comfortable Eyes: blind right eye, left eye: PERRL, lids and conjunctivae normal ENMT: Mucous membranes are moist. Posterior pharynx clear of any exudate or lesions.  Neck: normal, supple, no masses, no thyromegaly Respiratory: bibasilar crackles. Normal respiratory effort. No accessory muscle use.  Cardiovascular: normal s1, s2 sounds, no  murmurs / rubs / gallops. 1+ BLE extremity edema. 2+ pedal pulses. No carotid bruits.  Abdomen: no tenderness, no masses palpated. No hepatosplenomegaly. Bowel sounds positive.  Musculoskeletal: no clubbing / cyanosis. No joint deformity upper and lower extremities. Good ROM, no contractures. Normal muscle tone.  Skin: no rashes, lesions, ulcers. No induration Neurologic: CN 2-12 grossly intact. Sensation intact, DTR normal. Strength 5/5 in all 4.  Psychiatric: diminished judgment and insight. Alert and oriented x 3. Normal mood.   Labs on Admission: I have personally reviewed following labs and imaging studies  CBC: Recent Labs  Lab 05/28/23 0322  WBC 8.4  HGB 14.7  HCT 45.1  MCV 97.6  PLT 211   Basic Metabolic Panel: Recent Labs  Lab 05/28/23 0322  NA 137  K 3.5  CL 103  CO2 23  GLUCOSE 114*  BUN 24*  CREATININE 1.23  CALCIUM 8.5*  MG 1.3*   GFR: CrCl cannot be calculated (Unknown ideal weight.). Liver Function Tests: Recent Labs  Lab 05/28/23 0322  AST 31  ALT 22  ALKPHOS 51  BILITOT 0.6  PROT 7.1  ALBUMIN 3.5   Recent Labs  Lab 05/28/23 0322  LIPASE 28   No results for input(s): "AMMONIA" in the last 168 hours. Coagulation Profile: No results for input(s): "INR", "PROTIME" in the last 168 hours. Cardiac Enzymes: No results for input(s): "CKTOTAL", "CKMB", "CKMBINDEX", "TROPONINI" in the last 168 hours. BNP (last 3 results) No results for input(s): "PROBNP" in the last 8760 hours. HbA1C: No results for input(s): "HGBA1C" in the last 72 hours. CBG: No results for input(s): "GLUCAP" in the last 168 hours. Lipid Profile: No results for input(s): "CHOL", "HDL", "LDLCALC", "TRIG", "CHOLHDL", "LDLDIRECT" in the last 72 hours. Thyroid Function Tests: No results for input(s): "TSH", "T4TOTAL", "FREET4", "T3FREE", "THYROIDAB" in the last 72 hours. Anemia Panel: No results for input(s): "VITAMINB12", "FOLATE", "FERRITIN", "TIBC", "IRON", "RETICCTPCT" in the  last 72 hours. Urine analysis:    Component Value Date/Time   COLORURINE YELLOW 09/07/2021 0826   APPEARANCEUR CLEAR 09/07/2021 0826   LABSPEC 1.007 09/07/2021 0826   PHURINE 5.0 09/07/2021 0826   GLUCOSEU >=500 (A) 09/07/2021 0826   HGBUR SMALL (A) 09/07/2021 0826   BILIRUBINUR NEGATIVE 09/07/2021 0826   KETONESUR 5 (A) 09/07/2021 0826   PROTEINUR 100 (A) 09/07/2021 0826   UROBILINOGEN 0.2 08/25/2013 0122   NITRITE NEGATIVE 09/07/2021 0826   LEUKOCYTESUR NEGATIVE 09/07/2021 0826    Radiological Exams on Admission: DG Chest Port 1 View Result Date: 05/28/2023 CLINICAL DATA:  Shortness of breath and increased dyspnea EXAM: PORTABLE CHEST 1 VIEW COMPARISON:  04/23/2023 FINDINGS: Stable cardiomegaly. Sternotomy and CABG. Right chest wall ICD. Diffuse interstitial coarsening. No focal consolidation, pleural effusion, or pneumothorax. IMPRESSION: Cardiomegaly with mild pulmonary edema. Electronically Signed   By: Minerva Fester M.D.   On: 05/28/2023 03:33   Assessment/Plan Principal Problem:   Acute on chronic systolic (congestive) heart failure (HCC) Active Problems:   Dyslipidemia   Essential hypertension   Cardiomyopathy, ischemic   Automatic implantable cardioverter-defibrillator in situ   Coronary arteriosclerosis   Ventricular tachycardia (HCC)   Atrial fibrillation, chronic (HCC)   History of Substance abuse   Chronic low back pain   Chronic pain   Polypharmacy   Diverticulosis   Generalized weakness   Acquired thrombophilia (HCC)   Acute HFrEF - continue IV furosemide for diuresis - monitoring daily weights, intake/output, electrolytes - daily ReDs vest reading requested - TTE ordered to assess heart structure and function - 2 gram sodium restricted diet - resumed home entresto, empagliflozin  Ischemic cardiomyopathy - resume home meds when reconciled  - AICD interrogated in ED no concerning findings  Nonsustained V tach - stable, resume home medication -  monitor electrolytes - add magnesium - AICD in place   Chronic atrial fibrillation  - resume home metoprolol - resume home apixaban for full anticoagulation   Essential hypertension - BPs well managed on current home meds  Acquired thrombophilia - resumed home apixaban as noted - bleeding precautions advised  Chronic low back pain Opioid dependence - resumed home pain management regimen  Dyslipidemia - rosuvastatin 40 mg daily ordered - follow up lipid panel in AM  Generalized Weakness Near syncope - repeating 2D echocardiogram - PT eval  -  treating heart failure exacerbation as noted - further recommendations to follow  Chronic allergic rhinitis - resumed home loratadine and fluticasone nasal spray  DVT prophylaxis: apixaban   Code Status: FULL   Family Communication:   Disposition Plan: anticipate home   Consults called:   Admission status: INP  Time spent: 64 mins  Level of care: Telemetry Standley Dakins MD [facility] How to contact the [facility] Attending or Consulting provider 7A - 7P or covering provider during after hours 7P -7A, for this patient?  Check the care team in [facility] and look for a) attending/consulting [facility] provider listed and b) the [facility] team listed Log into [email or web address] and use 's universal password to access. If you do not have the password, please contact the hospital operator. Locate the [facility] provider you are looking for under [facility] and page to a number that you can be directly reached. If you still have difficulty reaching the provider, please page the [facility] (Director on Call) for the Hospitalists listed on amion for assistance.   If 7PM-7AM, please contact night-coverage [email or web address] Password [facility]  05/28/2023, 7:43 AM

## 2023-05-28 NOTE — Progress Notes (Signed)
 Assisting pt to bathroom, noted what looked like dried blood on front of underwear. Pt states, "Oh, I think I got a boil in my groin." Unable to visualize any wounds with pt standing, pt unable to urinate so assisted pt back to bed. With pt lying flat, did notice pt has 2cm x 2cm round wound just lateral to perineal area/testicles that was not noticed on admission skin eval. With palpation, wound noted to drain yellow/tan colored drainage. No associated erythema to peri-wound area and pt with no complaints of discomfort with palpation of area. Wound cleaned, perineal area cleaned as well. MD Laural Benes updated.

## 2023-05-29 ENCOUNTER — Inpatient Hospital Stay (HOSPITAL_COMMUNITY)

## 2023-05-29 DIAGNOSIS — I5023 Acute on chronic systolic (congestive) heart failure: Secondary | ICD-10-CM | POA: Insufficient documentation

## 2023-05-29 DIAGNOSIS — I4821 Permanent atrial fibrillation: Secondary | ICD-10-CM

## 2023-05-29 DIAGNOSIS — I10 Essential (primary) hypertension: Secondary | ICD-10-CM

## 2023-05-29 DIAGNOSIS — D6869 Other thrombophilia: Secondary | ICD-10-CM | POA: Diagnosis not present

## 2023-05-29 LAB — BASIC METABOLIC PANEL WITH GFR
Anion gap: 8 (ref 5–15)
BUN: 21 mg/dL (ref 8–23)
CO2: 28 mmol/L (ref 22–32)
Calcium: 8.7 mg/dL — ABNORMAL LOW (ref 8.9–10.3)
Chloride: 99 mmol/L (ref 98–111)
Creatinine, Ser: 0.92 mg/dL (ref 0.61–1.24)
GFR, Estimated: >60 mL/min (ref >60)
Glucose, Bld: 99 mg/dL (ref 70–99)
Potassium: 3.4 mmol/L — ABNORMAL LOW (ref 3.5–5.1)
Sodium: 135 mmol/L (ref 135–145)

## 2023-05-29 LAB — BRAIN NATRIURETIC PEPTIDE: B Natriuretic Peptide: 672 pg/mL — ABNORMAL HIGH (ref 0.0–100.0)

## 2023-05-29 LAB — LIPID PANEL
Cholesterol: 99 mg/dL (ref 0–200)
HDL: 38 mg/dL — ABNORMAL LOW (ref >40)
LDL Cholesterol: 42 mg/dL (ref 0–99)
Total CHOL/HDL Ratio: 2.6 ratio
Triglycerides: 96 mg/dL (ref <150)
VLDL: 19 mg/dL (ref 0–40)

## 2023-05-29 LAB — MAGNESIUM: Magnesium: 2 mg/dL (ref 1.7–2.4)

## 2023-05-29 MED ORDER — POTASSIUM CHLORIDE CRYS ER 20 MEQ PO TBCR
60.0000 meq | EXTENDED_RELEASE_TABLET | Freq: Once | ORAL | Status: AC
  Administered 2023-05-29: 60 meq via ORAL
  Filled 2023-05-29: qty 3

## 2023-05-29 MED ORDER — SACUBITRIL-VALSARTAN 49-51 MG PO TABS
1.0000 | ORAL_TABLET | Freq: Two times a day (BID) | ORAL | Status: DC
  Filled 2023-05-29 (×2): qty 1

## 2023-05-29 NOTE — Progress Notes (Signed)
 Patient's sister called and states that she went to pt's house while patient has been in the hospital because patient asked her to. The sister states that the house did not have any food, clothes everywhere, mail not opened from a year ago, and meds not taken from the month of march. Sister is worried about pt going home by his self. MD Tat and social worker notified.

## 2023-05-29 NOTE — Progress Notes (Signed)
   05/29/23 0836  Vitals  Temp 97.7 F (36.5 C)  Temp Source Oral  BP (!) 83/56  MAP (mmHg) 66  BP Method Automatic  Pulse Rate 82  Pulse Rate Source Monitor  MEWS COLOR  MEWS Score Color Green  Oxygen Therapy  SpO2 93 %  O2 Device Room Air  MEWS Score  MEWS Temp 0  MEWS Systolic 1  MEWS Pulse 0  MEWS RR 0  MEWS LOC 0  MEWS Score 1   MD Tat notifed. Held metoprolol and lasix

## 2023-05-29 NOTE — Consult Note (Addendum)
 Cardiology Consultation   Patient ID: Douglas [name] MRN: [id number]; DOB: 09/04/1955  Admit date: 05/28/2023 Date of Consult: 05/29/2023  PCP:  Douglas Marlin, DO   [facility] Providers Cardiologist:  [name], MD     Now followed by [facility]   Patient Profile:   Douglas [name] is a 68 y.o. male with a hx of HFrEF (EF 10-15% in 05/2017, now 35-40%), V. Tach s/p ICD placement 07/2017, CAD s/p PCI (1996) and 4v CABG (2002 with Cath 2019 at [facility] 4/4 patent bypass grafts; 40 - 60% stenosis in body of SVG to OM-2  patent LIMA-LAD, SVG-OM2/OM3 with med mgmt), HTN, HLD, permanent atrial fibrillation on apixaban, asthma, anxiety disorder, chronic pain, opioid dependent, GERD, who is being seen 05/29/2023 for the evaluation of heart failure and presynope at the request of Dr. Arbutus Leas.  History of Present Illness:   Douglas [name] was last seen in heart care 04/2019 for follow-up on cardiomyopathy and A-fib with RVR.  At that time, patient was stable without any cardiac complaints and no medication changes. EKG at office visit showed A-fib with HR 73, Q waves in anterior region and poor R wave progression. Per Chart review, he has been followed by [facility] with last OV on 04/08/2023.   He was recently hospitalized in 03/2023 for fall  resulting in scalp hematoma which seems to be related to hypotension. Medications were held during admission and resumed on discharge: Aldactone 25 mg, Entresto 97-103 mg BID, Jardiance 10 mg, Torsemide 20 mg, and Toprol was reduced to 50 mg.  Presented to ED 05/28/2023 for chest pain, shortness of breath, diaphoresis, and presyncope. Initial V/S: 128/101, HR 92, O2 94.  Initial labs: CMP notable for K 3.5, GLU 114, Cr 1.23, Ca 8.5, Mg 1.3, AST/ALT WNL. CBC WNL.  Positive for THC. A1c 5.9. BNP 557 (vs 253 on 03/2023). HsTn flat 17 > 21.   CXR revealed cardiomegaly with mild pulmonary edema and right chest wall ICD.  EKG: A-fib, HR 93, multifocal PVCs, Q waves  in anterior, poor R wave progression, QTC 408 ms. (Similar to previous 04/23/2023 except QTC prolonged 682 ms) ECHO 05/28/23: LV poorly visualized. LVEF 35 to 40%.  LV moderately decreased function.  Mildly dilated LV size.  RV not well-visualized but appears normal in size and function.  L/R atrial size moderately dilated.  Mild MVR.  Aortic not well-visualized  History was difficult to obtain from patient. In speaking with patient, reports SOB, however he relates to asthma with recent pollen triggers. He does not have emergency inhalers refilled due to recent home aide changing his medications without MD approval. He denies any edema, DOE, orthopnea, or PND. He has to sleep elevated due to chronic back pain with 2 pillows. Reports recent episode of chest pain, described as heart burn occurring when laying down and relieved with PPI. He is able to walk around home and grocery store without any concerns. He denies any recent dizziness or presynope prior to this hospitalization.Denies any palpitations. Reports lows sodium diet. Reports he drinks a lot of fluids but unable to quantify. He stays at home alone and notes medication compliance, however discrepancy from notes in system.  He monitors BP at home, approx. 120/80's. It seems that he resumed medications without follow up cardiology appointment. Prior to hospitalization, he reports poor urine output but significant improvement with IV lasix. He would like to reestablish care with [facility], instead of [facility].    Past  Medical History:  Diagnosis Date   A-fib [facility])    AICD (automatic cardioverter/defibrillator) present    Anemia    Asthma    CAD (coronary artery disease)    CHF (congestive heart failure) (HCC)    Diabetes mellitus without complication (HCC)    Dyslipidemia    Elevated cholesterol    Hypertension     Past Surgical History:  Procedure Laterality Date   CARDIAC CATHETERIZATION  1996   stent placement   CARDIAC  DEFIBRILLATOR PLACEMENT     CORONARY ARTERY BYPASS GRAFT  2002   times 4   CORONARY ARTERY BYPASS GRAFT     EYE SURGERY     ICD GENERATOR CHANGEOUT N/A 09/30/2018   Procedure: ICD GENERATOR CHANGEOUT;  Surgeon: Marinus Maw, MD;  Location: [facility] INVASIVE CV LAB;  Service: Cardiovascular;  Laterality: N/A;   IMPLANTABLE CARDIOVERTER DEFIBRILLATOR (ICD) GENERATOR CHANGE N/A 09/10/2011   Procedure: ICD GENERATOR CHANGE;  Surgeon: Marinus Maw, MD;  Location: [facility] CATH LAB;  Service: Cardiovascular;  Laterality: N/A;   INSERT / REPLACE / REMOVE PACEMAKER     LEFT HEART CATHETERIZATION WITH CORONARY ANGIOGRAM N/A 08/29/2011   Procedure: LEFT HEART CATHETERIZATION WITH CORONARY ANGIOGRAM;  Surgeon: Peter M Swaziland, MD;  Location: [facility] CATH LAB;  Service: Cardiovascular;  Laterality: N/A;     Home Medications:  Prior to Admission medications   Medication Sig Start Date End Date Taking? Authorizing Provider  metoprolol (TOPROL-XL) 200 MG 24 hr tablet Take 200 mg by mouth daily. 04/22/23  Yes [provider]  ALPRAZolam Prudy Feeler) 1 MG tablet Take 1-2 mg by mouth 2 (two) times daily as needed for anxiety. 06/27/21   [provider]  Carboxymethylcellulose Sodium (ARTIFICIAL TEARS OP) Place 1 drop into the right eye 3 (three) times daily.    [provider]  citalopram (CELEXA) 20 MG tablet Take 20 mg by mouth daily. 04/05/17   [provider]  ELIQUIS 5 MG TABS tablet TAKE ONE TABLET BY MOUTH TWICE DAILY Patient taking differently: Take 5 mg by mouth 2 (two) times daily. 08/28/18   [name], MD  ENTRESTO 97-103 MG Take 1 tablet by mouth 2 (two) times daily. 08/30/21   [provider]  ezetimibe (ZETIA) 10 MG tablet Take 10 mg by mouth daily. 06/01/21   [provider]  fluticasone (FLONASE) 50 MCG/ACT nasal spray Place 1 spray into both nostrils daily.    [provider]  JARDIANCE 10 MG TABS tablet Take 10 mg by mouth daily. 06/27/21   [provider]  lansoprazole (PREVACID) 30 MG capsule Take 30 mg by mouth daily.      [provider]  loratadine (CLARITIN) 10 MG tablet Take 10 mg by mouth daily.    [provider]  metoprolol succinate (TOPROL-XL) 100 MG 24 hr tablet Take 0.5 tablets (50 mg total) by mouth daily. Take with or immediately following a meal. 04/25/23   Johnson, Clanford L, MD  Multiple Vitamin (MULTIVITAMIN WITH MINERALS) TABS Take 1 tablet by mouth daily. And balance of nature    [provider]  Omega-3 Fatty Acids (FISH OIL) 1200 MG CAPS Take 1,200 mg by mouth daily.    [provider]  oxyCODONE-acetaminophen (PERCOCET) 10-325 MG per tablet Take 1 tablet by mouth every 6 (six) hours as needed for pain.     [provider]  rosuvastatin (CRESTOR) 40 MG tablet TAKE ONE (1) TABLET EACH DAY 10/18/20   [name], MD  spironolactone (ALDACTONE) 25 MG tablet TAKE ONE (1) TABLET EACH DAY 06/07/20   [name], MD  tiZANidine (ZANAFLEX) 4 MG tablet Take 4 mg by mouth every 8 (eight) hours as needed for muscle spasms. 09/29/11   [provider]  torsemide (DEMADEX) 20 MG tablet TAKE ONE (1) TABLET EACH DAY 10/15/19   [name], MD    Inpatient Medications: Scheduled Meds:  apixaban  5 mg Oral BID   citalopram  20 mg Oral Daily   empagliflozin  10 mg Oral Daily   ezetimibe  10 mg Oral Daily   fluticasone  1 spray Each Nare Daily   furosemide  20 mg Intravenous Once   loratadine  10 mg Oral QHS   metoprolol succinate  50 mg Oral Daily   mupirocin ointment   Topical BID   pantoprazole  40 mg Oral BID   potassium chloride  60 mEq Oral Once   rosuvastatin  40 mg Oral QPM   sacubitril-valsartan  1 tablet Oral BID   sodium chloride flush  3 mL Intravenous Q12H   spironolactone  25 mg Oral Daily   Continuous Infusions:   PRN Meds: acetaminophen, ALPRAZolam, oxyCODONE-acetaminophen **AND** oxyCODONE, sodium chloride flush, tiZANidine,  traZODone  Allergies:    Allergies  Allergen Reactions   Codeine Hives and Itching   Xarelto [Rivaroxaban] Other (See Comments)    Bleeding ulcers   Lasix [Furosemide] Other (See Comments)    Patient says he gets light headed and dizzy.    Vioxx [Rofecoxib] Palpitations and Other (See Comments)    Caused massive heart attack    Social History:   Social History   Socioeconomic History   Marital status: Divorced    Spouse name: Not on file   Number of children: Not on file   Years of education: Not on file   Highest education level: Not on file  Occupational History   Not on file  Tobacco Use   Smoking status: Never   Smokeless tobacco: Never  Vaping Use   Vaping status: Never Used  Substance and Sexual Activity   Alcohol use: Never   Drug use: Never    Comment: marijuana   Sexual activity: Not on file  Other Topics Concern   Not on file  Social History Narrative   ** Merged History Encounter **       Family History:   Family History  Problem Relation Age of Onset   Heart attack Father      ROS:  Please see the history of present illness.  All other ROS reviewed and negative.     Physical Exam/Data:   Vitals:   05/29/23 0609 05/29/23 0634 05/29/23 0834 05/29/23 0836  BP: (!) 81/59 94/63 (!) 83/56 (!) 83/56  Pulse: 81 72 62 82  Resp: 20     Temp: 98.4 F (36.9 C)   97.7 F (36.5 C)  TempSrc:    Oral  SpO2: 95%   93%  Weight:      Height:        Intake/Output Summary (Last 24 hours) at 05/29/2023 1054 Last data filed at 05/28/2023 2130 Gross per 24 hour  Intake 550 ml  Output 2150 ml  Net -1600 ml      05/29/2023    5:00 AM 05/28/2023    8:53 AM 04/24/2023    4:23 PM  Last 3 Weights  Weight (lbs) 268 lb 1.3 oz 257 lb 273 lb 9.5 oz  Weight (kg) 121.6 kg 116.574 kg  124.1 kg     Body mass index is 39.59 kg/m. General:  Laying in bed, in no acute distress with bedside staff HEENT: normal Neck: no JVD Vascular: No carotid bruits; Distal pulses 2+  bilaterally Cardiac:  normal S1, S2; irregularly irregular; no murmur  Lungs:  diminished breath sounds in base  Abd: soft, nontender, no hepatomegaly  Ext: no edema Musculoskeletal:  No deformities, BUE and BLE strength normal and equal Skin: warm and dry  Neuro:  CNs 2-12 intact, no focal abnormalities noted Psych:  Normal affect   EKG:  The EKG was personally reviewed and demonstrates:  A-fib, HR 93, multifocal PVCs, Q waves in anterior, poor R wave progression, QTC 408 ms. (Similar to previous 04/23/2023 except QTC prolonged 682 ms) Telemetry:  Telemetry was personally reviewed and demonstrates:  A fib, HR 70-80's with occasional PVC's  Relevant CV Studies: ECHO IMPRESSIONS 05/28/2023 1. LV poorly visualized even with the use of echocontrast. Difficult  visualization thorughout study. . Left ventricular ejection fraction, by  estimation, is 35 to 40%. The left ventricle has moderately decreased  function. Left ventricular endocardial  border not optimally defined to evaluate regional wall motion. The left  ventricular internal cavity size was mildly dilated. Left ventricular  diastolic parameters are indeterminate.   2. RV not well visualized. Grossly appears normal in size and function. .  Right ventricular systolic function was not well visualized. The right  ventricular size is not well visualized. Tricuspid regurgitation signal is  inadequate for assessing PA  pressure.   3. Left atrial size was moderately dilated.   4. Right atrial size was moderately dilated.   5. The mitral valve is abnormal. Mild mitral valve regurgitation. No  evidence of mitral stenosis.   6. The aortic valve was not well visualized. Aortic valve regurgitation  is not visualized. No aortic stenosis is present.   7. The inferior vena cava is normal in size with greater than 50%  respiratory variability, suggesting right atrial pressure of 3 mmHg.   ECHO SUMMARY 09/2021 with [facility]  Left ventricular  systolic function is severely reduced.  There is severe global hypokinesis of the left ventricle.  Regional wall motion abnormalities cannot be excluded due to limited  visualization.  LV ejection fraction = 25-30%.  The right ventricle is borderline dilated.  The right ventricular systolic function is moderate to severely  reduced.  The right atrium is mildly to moderately dilated.  There is moderate mitral regurgitation.  There is mild tricuspid regurgitation.  Estimated right ventricular systolic pressure is 47.1 mmHg.  Moderate pulmonary hypertension.  Probably no significant change in comparison with the prior study  noted   CATH 2019 at [facility]  #1. Severe 3 - vessel native coronary artery disease  #2. 4/4 patent bypass grafts; 40 - 60% stenosis in body of SVG to OM-2  #3. Mildly elevated filling pressures with cardiac index of 2.7  Recommendations:  #1. Further management per Dr. Truett Perna and Team   Laboratory Data:  High Sensitivity Troponin:   Recent Labs  Lab 05/28/23 0322 05/28/23 0527  TROPONINIHS 17 21*     Chemistry Recent Labs  Lab 05/28/23 0322 05/29/23 0413  NA 137 135  K 3.5 3.4*  CL 103 99  CO2 23 28  GLUCOSE 114* 99  BUN 24* 21  CREATININE 1.23 0.92  CALCIUM 8.5* 8.7*  MG 1.3* 2.0  GFRNONAA >60 >60  ANIONGAP 11 8    Recent Labs  Lab 05/28/23 0322  PROT 7.1  ALBUMIN 3.5  AST 31  ALT 22  ALKPHOS 51  BILITOT 0.6   Lipids  Recent Labs  Lab 05/29/23 0413  CHOL 99  TRIG 96  HDL 38*  LDLCALC 42  CHOLHDL 2.6    Hematology Recent Labs  Lab 05/28/23 0322  WBC 8.4  RBC 4.62  HGB 14.7  HCT 45.1  MCV 97.6  MCH 31.8  MCHC 32.6  RDW 16.3*  PLT 211   Thyroid No results for input(s): "TSH", "FREET4" in the last 168 hours.  BNP Recent Labs  Lab 05/28/23 0322 05/29/23 0413  BNP 557.0* 672.0*    DDimer No results for input(s): "DDIMER" in the last 168 hours.   Radiology/Studies:  DG CHEST PORT 1 VIEW Result Date:  05/29/2023 CLINICAL DATA:  68 year old male with shortness of breath. EXAM: PORTABLE CHEST 1 VIEW COMPARISON:  Portable chest 05/28/2023 and earlier. FINDINGS: Portable AP semi upright view at 0611 hours. More lordotic positioning. Stable cardiomegaly, CABG, right chest AICD. Stable to mildly improved lung volumes. Allowing for portable technique the lungs are clear. No overt edema. No pneumothorax or pleural effusion. Stable visualized osseous structures. Paucity of bowel gas. IMPRESSION: Stable cardiomegaly. No acute cardiopulmonary abnormality. Electronically Signed   By: Odessa Fleming M.D.   On: 05/29/2023 06:44   DG Chest Port 1 View Result Date: 05/28/2023 CLINICAL DATA:  Shortness of breath and increased dyspnea EXAM: PORTABLE CHEST 1 VIEW COMPARISON:  04/23/2023 FINDINGS: Stable cardiomegaly. Sternotomy and CABG. Right chest wall ICD. Diffuse interstitial coarsening. No focal consolidation, pleural effusion, or pneumothorax. IMPRESSION: Cardiomegaly with mild pulmonary edema. Electronically Signed   By: Minerva Fester M.D.   On: 05/28/2023 03:33    Assessment and Plan:   Acute HFrEF Reports SOB is related to asthma  with increase in pollen. Does not have access to inhalers. BNP 557 (vs 253 on 03/2023).CXR suggestive of HF.  - Echo this admission showed improved EF 35 to 40% compared to 20-25% in 09/2021 and 10 to 15% in 05/2017 - Currently on Entresto 97-103 mg, Jardiance 10 mg, Spironolactone 25 mg. On hold due to hypotension this am Toprol XL 50 mg, IV furosemide 40 mg BID. Decrease Entresto to 49-51 mg BID, will add hold parameters. Will continue to hold Furosemide and Metoprolol, will add hold parameters.   - Given significant hypotension with restarting home medication, concerned about compliance at home.  - net I/O: -1479, wt 257 > 268 (suspect weight documentation is inaccurate), Cr: 1.23, appear euvolemic on exam, obtain REDS vest and standing weight - Continue to monitor daily weights, I's/O's,  renal function  Near syncope - Denies any symptoms prior to this visit, however, did have fall in 03/2023 related to hypotension.   - Less likely ischemic related with flat Tn, no ischemic EKG changes and negative wall motion abnormality on ECHO.  - Suspect related to orthostatic hypotension with current BP  - Adjust medications as above   CAD  Chest pain  - s/p PCI (1996) and 4v CABG (2002 with Cath 2019 at [facility] 4/4 patent bypass grafts; 40 - 60% stenosis in body of SVG to OM-2  patent LIMA-LAD, SVG-OM2/OM3 with med mgmt - Reported episode of burning CP onset with laying down and relieved with PPI. Tn flat 17 > 21. Otherwise, denies any exertional CP. No ischemic EKG changes. Less likely ischemic related. - Continue statin. Not on ASA given the need for [facility].   Ischemic cardiomyopathy ICD in place ([employer] since 07/2017) -  managed by GDMT above  - Interrogation reviewed: No recent shocks, longevity 5.8 years  NSVT - AICD in place, Stable per interrogation reviewed above - Treated with Mag. K: 3.4 Mg: 2.0 this am - Ordered additional K to keep around 4  Permanent A-fib - Held Toprol XL 50 mg this am, entered hold parameters. May need to reduce to 25 mg.  -Currently apixaban 5 mg  - Afib controlled, HR 70-80's on telemetry   HTN - BP this am: 94/63  - Holding BP medications per GDMT above  - Oxycodone may be contributing to hypotension. Can consider holding. Will defer to primary.   HLD - LDL: 42, goal < 55 - Currently on rosuvastatin 40 Mg and Zetia 10 mg  Otherwise managed by primary -Chronic low back pain -Opioid dependence -Chronic allergic rhinitis   Risk Assessment/Risk Scores:   New York Heart Association (NYHA) Functional Class NYHA Class II  CHA2DS2-VASc Score = 4  This indicates a 4.8% annual risk of stroke. The patient's score is based upon: CHF History: 1 HTN History: 1 Diabetes History: 0 Stroke History: 0 Vascular Disease History: 1 Age Score:  1 Gender Score: 0        For questions or updates, please contact [facility] Please consult [email or web address] for contact info under    Signed, Basilio Cairo, PA-C  05/29/2023 10:54 AM     Attending Note   Patient seen and discussed with PA Luan Pulling, I agree with her documentation. 68 yo male history followed by [facility] for history of CAD with prior CABG, chronic HFrEF, AICD, permanent afib, HTN,HLD, asthma, obesity, presents with SOB. Difficult historian, does not answer questions clearly and can be tangential   WBC 8.4 Hgb 14.7 Plt 211 K 3.5 Cr 1.23 BUN 24 BNP 557 Mg 1.3 UDS +THC Trop 17-->21 CXR mild edema Afib occasional PVCs Echo: LVEF 35-40%,     1.Acute on chronic HFrEF - Echo: LVEF 35-40% -family phone note reports when they went to his house several medicine bottles that have not been used - BNP 557, CXR mild edema.  - diuresis limited by low bp's this morning, AM lasix held along with AM toprol 50. He did receive his entresto dose and aldactone as well as a muscle relaxer  - hold HF meds, appears he has not been taking at home and listed doses are too strong for him. Add back as able based on bp at low doses and titrate slowly. Can continue his jardiance - Has ICD followed by [facility], check last monthnormal function. No significant arrhythmias. ER interrogation isolated episode NSVT.   2.CAD - prior CABG, interventions as listed above. - no evidence of acute issues this admission  3.Permanent afib - hold toprol due to hyputeions - continue eliquis  Dina Rich MD

## 2023-05-29 NOTE — [place] FL2 (Signed)
 [place] MEDICAID FL2 LEVEL OF CARE FORM     IDENTIFICATION  Patient Name: Douglas [name] Birthdate: [birth year] Sex: male Admission Date (Current Location): 05/28/2023  [facility] and [place] Number:  [employer] and Address:  [facility]  [address] [place] [address]      Provider Number: [id number]  Attending Physician Name and Address:  Catarina Hartshorn, MD  Relative Name and Phone Number:  Mackie Pai (Sister)  [phone]    Current Level of Care: Hospital Recommended Level of Care: Skilled Nursing Facility Prior Approval Number:    Date Approved/Denied:   PASRR Number: [id number]  Discharge Plan: SNF    Current Diagnoses: Patient Active Problem List   Diagnosis Date Noted   Acute on chronic systolic (congestive) heart failure (HCC) 05/28/2023   Acquired thrombophilia (HCC) 05/28/2023   Fall at home 04/23/2023   Generalized weakness 04/23/2023   Prolonged QT interval 04/23/2023   AKI (acute kidney injury) (HCC) 04/23/2023   Scalp hematoma 04/23/2023   Hypoalbuminemia due to protein-calorie malnutrition (HCC) 04/23/2023   Chronic systolic CHF (congestive heart failure) (HCC) 04/23/2023   Anxiety 04/23/2023   Multiple trauma 06/02/2017   Acute on chronic combined systolic and diastolic CHF (congestive heart failure) (HCC) 06/02/2017   Loss of consciousness (HCC) 06/02/2017   Acute on chronic respiratory failure with hypoxia (HCC) 06/02/2017   Multiple closed fractures of ribs of right side 06/02/2017   Contusion of right lung without open wound into thorax 06/02/2017   Leukocytosis 06/02/2017   Elevated random blood glucose level 06/02/2017   Coronary artery disease 06/02/2017   Pacemaker 06/02/2017   Diverticulosis 06/02/2017   Closed L1 vertebral fracture (HCC) 06/02/2017   Closed L2 vertebral fracture (HCC) 06/02/2017   Vertebral compression fracture (HCC) 06/02/2017   Closed fracture of right orbital floor (HCC) 06/02/2017    Right orbit fracture, closed, initial encounter (HCC) 06/02/2017   Rupture of globe of eye following blunt trauma, right, initial encounter 06/02/2017   Contusion of face 06/02/2017   Spondylosis of cervical spine 06/02/2017   Persistent atrial fibrillation (HCC) 06/02/2017   Coagulopathy (HCC) 06/02/2017   Ganglion cyst of dorsum of right wrist 11/04/2015   Polypharmacy 11/04/2015   Allergic rhinitis 12/27/2014   Asthma 12/27/2014   Chronic low back pain 12/27/2014   Chronic pain 12/27/2014   Depression 12/27/2014   Gastroesophageal reflux disease without esophagitis 12/27/2014   Generalized anxiety disorder 12/27/2014   Impaired fasting glucose 12/27/2014   Insomnia 12/27/2014   Right shoulder pain 12/27/2014   Nausea 12/27/2014   Class 2 obesity due to excess calories in adult 12/27/2014   Hyperlipidemia 12/27/2014   DJD (degenerative joint disease), lumbosacral 08/26/2013   Seizure (HCC) 08/25/2013   Atrial fibrillation, chronic (HCC) 08/25/2013   History of Substance abuse 08/25/2013   Ventricular tachycardia (HCC) 05/23/2011   Coronary arteriosclerosis    Dyslipidemia 05/01/2010   MORBID OBESITY 05/01/2010   Essential hypertension 05/01/2010   Cardiomyopathy, ischemic 05/01/2010   Automatic implantable cardioverter-defibrillator in situ 05/01/2010    Orientation RESPIRATION BLADDER Height & Weight     Self, Time, Situation, Place  Normal Continent Weight: 121.6 kg Height:  5\' 9"  (175.3 cm)  BEHAVIORAL SYMPTOMS/MOOD NEUROLOGICAL BOWEL NUTRITION STATUS      Continent Diet (See DC summary)  AMBULATORY STATUS COMMUNICATION OF NEEDS Skin   Extensive Assist Verbally Bruising, Other (Comment) (non- pressure left groin wound)  Personal Care Assistance Level of Assistance  Bathing, Feeding, Dressing Bathing Assistance: Maximum assistance Feeding assistance: Limited assistance Dressing Assistance: Maximum assistance     Functional Limitations  Info  Sight, Hearing, Speech Sight Info: Impaired Hearing Info: Adequate Speech Info: Adequate    SPECIAL CARE FACTORS FREQUENCY  PT (By licensed PT)     PT Frequency: 5 times a week              Contractures Contractures Info: Not present    Additional Factors Info  Code Status Code Status Info: FULL             Current Medications (05/29/2023):  This is the current hospital active medication list Current Facility-Administered Medications  Medication Dose Route Frequency Provider Last Rate Last Admin   acetaminophen (TYLENOL) tablet 650 mg  650 mg Oral Q4H PRN Johnson, Clanford L, MD       ALPRAZolam Prudy Feeler) tablet 1 mg  1 mg Oral BID PRN Laural Benes, Clanford L, MD   1 mg at 05/29/23 1118   apixaban (ELIQUIS) tablet 5 mg  5 mg Oral BID Johnson, Clanford L, MD   5 mg at 05/29/23 0831   citalopram (CELEXA) tablet 20 mg  20 mg Oral Daily Johnson, Clanford L, MD   20 mg at 05/29/23 0831   empagliflozin (JARDIANCE) tablet 10 mg  10 mg Oral Daily Johnson, Clanford L, MD   10 mg at 05/29/23 0831   ezetimibe (ZETIA) tablet 10 mg  10 mg Oral Daily Johnson, Clanford L, MD   10 mg at 05/29/23 0831   fluticasone (FLONASE) 50 MCG/ACT nasal spray 1 spray  1 spray Each Nare Daily Johnson, Clanford L, MD   1 spray at 05/29/23 1610   furosemide (LASIX) injection 20 mg  20 mg Intravenous Once Johnson, Clanford L, MD       loratadine (CLARITIN) tablet 10 mg  10 mg Oral QHS Johnson, Clanford L, MD   10 mg at 05/28/23 2130   mupirocin ointment (BACTROBAN) 2 %   Topical BID Cleora Fleet, MD   Given at 05/29/23 9604   oxyCODONE-acetaminophen (PERCOCET/ROXICET) 5-325 MG per tablet 1 tablet  1 tablet Oral Q6H PRN Standley Dakins L, MD   1 tablet at 05/28/23 2130   And   oxyCODONE (Oxy IR/ROXICODONE) immediate release tablet 5 mg  5 mg Oral Q6H PRN Johnson, Clanford L, MD       pantoprazole (PROTONIX) EC tablet 40 mg  40 mg Oral BID Johnson, Clanford L, MD   40 mg at 05/29/23 0831    rosuvastatin (CRESTOR) tablet 40 mg  40 mg Oral QPM Johnson, Clanford L, MD   40 mg at 05/28/23 2136   sodium chloride flush (NS) 0.9 % injection 3 mL  3 mL Intravenous Q12H Johnson, Clanford L, MD   3 mL at 05/29/23 0833   sodium chloride flush (NS) 0.9 % injection 3 mL  3 mL Intravenous PRN Johnson, Clanford L, MD       tiZANidine (ZANAFLEX) tablet 4 mg  4 mg Oral Q8H PRN Johnson, Clanford L, MD   4 mg at 05/29/23 0015   traZODone (DESYREL) tablet 50 mg  50 mg Oral QHS PRN Standley Dakins L, MD   50 mg at 05/28/23 2130     Discharge Medications: Please see discharge summary for a list of discharge medications.  Relevant Imaging Results:  Relevant Lab Results:   Additional Information SS# [id number]  Leitha Bleak, RN

## 2023-05-29 NOTE — TOC Progression Note (Addendum)
 Transition of Care [facility]) - Progression Note    Patient Details  Name: Douglas [name] MRN: [id number] Date of Birth: 04-25-1955  [facility]) CM/SW Contact  Leitha Bleak, RN Phone Number: 05/29/2023, 3:37 PM  Clinical Narrative:   PT eval is pending, Patient and sister are agreeable to SNF. FL2 completed, TOC sending out the initial referral for bed offers. TOC following to sent PT's note. TOC will discuss offers with patient and start INS AUTH.   Expected Discharge Plan: Skilled Nursing Facility Barriers to Discharge: Continued Medical Work up  Expected Discharge Plan and Services       Living arrangements for the past 2 months: Single Family Home                   Social Determinants of Health (SDOH) Interventions SDOH Screenings   Food Insecurity: Food Insecurity Present (05/28/2023)  Housing: Low Risk  (05/28/2023)  Transportation Needs: No Transportation Needs (05/28/2023)  Utilities: Not At Risk (05/28/2023)  Social Connections: Moderately Isolated (05/28/2023)  Tobacco Use: Low Risk  (05/28/2023)    Readmission Risk Interventions     No data to display

## 2023-05-29 NOTE — Progress Notes (Addendum)
 PROGRESS NOTE  Douglas [name] LKG:[id number] DOB: November 18, 1955 DOA: 05/28/2023 PCP: Mattie Marlin, DO  Brief History:  68 year old male with chronic systolic heart failure (low EF 20%), status post ICD placement, history of V. tach, anxiety disorder, CAD, asthma, chronic pain, opioid dependent, GERD, chronic atrial fibrillation on apixaban, hyperlipidemia presented to the emergency department early this morning complaining of shortness of breath and weakness.  He reported that his shortness of breath came on acutely and worsened over the past few hours prior to arrival.  He feels diaphoretic.  He briefly felt like he was going to pass out but did not.  He has acid reflux and chest discomfort symptoms.  His ED workup was significant for an elevated BNP and pulmonary edema noted on chest x-ray.  He had a brief episode of nonsustained V. tach in the ED.  His ICD did not activate.  He was given IV Lasix and has started diuresing.  Hospital admission was requested for management of acute heart failure and near syncope.   Assessment/Plan: Acute on chronic HFrEF -05/28/2023 echo EF 35-40%, RV not well-visualized, -03/21/2020 echo EF 25-30%, global HK -Continue IV furosemide -Appreciate cardiology -Accurate I's and O's -Daily weights -continue Jardiance -low BP has limited GDMT--Entresto, metoprolol, lasix -appears to have poor compliance with meds at home  Ischemic cardiomyopathy/CAD ICD in place ([employer] since 07/2017) - on GDMT above  - Interrogation reviewed: No recent shocks, longevity 5.8 years -Not on ASA given the need for [facility]  -Trops flat 17 > 21. Otherwise, denies any exertional CP   Permanent atrial fibrillation -holding metoprolol due to soft BPs and hypotension -continue apixaban  Nonsustained ventricular tachycardia -s/p AICD -optimized electrolytes  Chronic low back pain Opioid dependence -PDMP reviewed-- -percocet 10/325, #180, last refill 05/10/23    Dyslipidemia - rosuvastatin 40 mg daily ordered  Class 2 obesity -BMI 39.59 -lifestyle modification         Family Communication:   no Family at bedside  Consultants:  cardiology  Code Status:  FULL   DVT Prophylaxis:apixaban   Procedures: As Listed in Progress Note Above  Antibiotics: None       Subjective: States swelling and sob are improving.  Denies cp, n/v/,d abd pain  Objective: Vitals:   05/29/23 0609 05/29/23 0634 05/29/23 0834 05/29/23 0836  BP: (!) 81/59 94/63 (!) 83/56 (!) 83/56  Pulse: 81 72 62 82  Resp: 20     Temp: 98.4 F (36.9 C)   97.7 F (36.5 C)  TempSrc:    Oral  SpO2: 95%   93%  Weight:      Height:        Intake/Output Summary (Last 24 hours) at 05/29/2023 1220 Last data filed at 05/28/2023 2130 Gross per 24 hour  Intake 550 ml  Output 2150 ml  Net -1600 ml   Weight change:  Exam:  General:  Pt is alert, follows commands appropriately, not in acute distress HEENT: No icterus, No thrush, No neck mass, Hernandez/AT Cardiovascular: IRRR, S1/S2, no rubs, no gallops Respiratory: bibasilar crackles. No wheeze Abdomen: Soft/+BS, non tender, non distended, no guarding Extremities: No edema, No lymphangitis, No petechiae, No rashes, no synovitis   Data Reviewed: I have personally reviewed following labs and imaging studies Basic Metabolic Panel: Recent Labs  Lab 05/28/23 0322 05/29/23 0413  NA 137 135  K 3.5 3.4*  CL 103 99  CO2 23 28  GLUCOSE 114* 99  BUN  24* 21  CREATININE 1.23 0.92  CALCIUM 8.5* 8.7*  MG 1.3* 2.0   Liver Function Tests: Recent Labs  Lab 05/28/23 0322  AST 31  ALT 22  ALKPHOS 51  BILITOT 0.6  PROT 7.1  ALBUMIN 3.5   Recent Labs  Lab 05/28/23 0322  LIPASE 28   No results for input(s): "AMMONIA" in the last 168 hours. Coagulation Profile: No results for input(s): "INR", "PROTIME" in the last 168 hours. CBC: Recent Labs  Lab 05/28/23 0322  WBC 8.4  HGB 14.7  HCT 45.1  MCV 97.6  PLT  211   Cardiac Enzymes: No results for input(s): "CKTOTAL", "CKMB", "CKMBINDEX", "TROPONINI" in the last 168 hours. BNP: Invalid input(s): "POCBNP" CBG: No results for input(s): "GLUCAP" in the last 168 hours. HbA1C: Recent Labs    05/28/23 0322  HGBA1C 5.9*   Urine analysis:    Component Value Date/Time   COLORURINE YELLOW 05/28/2023 1610   APPEARANCEUR CLEAR 05/28/2023 1610   LABSPEC 1.018 05/28/2023 1610   PHURINE 5.0 05/28/2023 1610   GLUCOSEU >=500 (A) 05/28/2023 1610   HGBUR SMALL (A) 05/28/2023 1610   BILIRUBINUR NEGATIVE 05/28/2023 1610   KETONESUR NEGATIVE 05/28/2023 1610   PROTEINUR 30 (A) 05/28/2023 1610   UROBILINOGEN 0.2 08/25/2013 0122   NITRITE NEGATIVE 05/28/2023 1610   LEUKOCYTESUR NEGATIVE 05/28/2023 1610   Sepsis Labs: @LABRCNTIP (procalcitonin:4,lacticidven:4) )No results found for this or any previous visit (from the past 240 hours).   Scheduled Meds:  apixaban  5 mg Oral BID   citalopram  20 mg Oral Daily   empagliflozin  10 mg Oral Daily   ezetimibe  10 mg Oral Daily   fluticasone  1 spray Each Nare Daily   furosemide  20 mg Intravenous Once   loratadine  10 mg Oral QHS   mupirocin ointment   Topical BID   pantoprazole  40 mg Oral BID   rosuvastatin  40 mg Oral QPM   sodium chloride flush  3 mL Intravenous Q12H   Continuous Infusions:  Procedures/Studies: DG CHEST PORT 1 VIEW Result Date: 05/29/2023 CLINICAL DATA:  68 year old male with shortness of breath. EXAM: PORTABLE CHEST 1 VIEW COMPARISON:  Portable chest 05/28/2023 and earlier. FINDINGS: Portable AP semi upright view at 0611 hours. More lordotic positioning. Stable cardiomegaly, CABG, right chest AICD. Stable to mildly improved lung volumes. Allowing for portable technique the lungs are clear. No overt edema. No pneumothorax or pleural effusion. Stable visualized osseous structures. Paucity of bowel gas. IMPRESSION: Stable cardiomegaly. No acute cardiopulmonary abnormality. Electronically  Signed   By: Odessa Fleming M.D.   On: 05/29/2023 06:44   ECHOCARDIOGRAM COMPLETE Result Date: 05/28/2023    ECHOCARDIOGRAM REPORT   Patient Name:   Douglas [name] Date of Exam: 05/28/2023 Medical Rec #:  [id number]        Height:       69.0 in Accession #:    [id number]       Weight:       273.6 lb Date of Birth:  1955-09-22        BSA:          2.360 m Patient Age:    67 years         BP:           112/70 mmHg Patient Gender: M                HR:           97 bpm. [facility] Procedure: 2D Echo, Cardiac Doppler, Color Doppler and Intracardiac            Opacification Agent (Both Spectral and Color Flow Doppler were            utilized during procedure). Indications:    CHF-Acute Systolic I50.21  History:        Patient has prior history of Echocardiogram examinations, most                 recent 06/02/2017. Cardiomyopathy and CHF, CAD, Arrythmias:Atrial                 Fibrillation; Risk Factors:Hypertension and Dyslipidemia. AICD                 (automatic cardioverter/defibrillator) present (From Hx).  Sonographer:    Celesta Gentile RCS Referring Phys: [phone] Cleora Fleet  Sonographer Comments: Technically difficult study due to poor echo windows. IMPRESSIONS  1. LV poorly visualized even with the use of echocontrast. Difficult visualization thorughout study. . Left ventricular ejection fraction, by estimation, is 35 to 40%. The left ventricle has moderately decreased function. Left ventricular endocardial border not optimally defined to evaluate regional wall motion. The left ventricular internal cavity size was mildly dilated. Left ventricular diastolic parameters are indeterminate.  2. RV not well visualized. Grossly appears normal in size and function. . Right ventricular systolic function was not well visualized. The right ventricular size is not well visualized. Tricuspid regurgitation signal is inadequate for assessing PA pressure.  3. Left atrial size was moderately dilated.  4. Right atrial  size was moderately dilated.  5. The mitral valve is abnormal. Mild mitral valve regurgitation. No evidence of mitral stenosis.  6. The aortic valve was not well visualized. Aortic valve regurgitation is not visualized. No aortic stenosis is present.  7. The inferior vena cava is normal in size with greater than 50% respiratory variability, suggesting right atrial pressure of 3 mmHg. FINDINGS  Left Ventricle: LV poorly visualized even with the use of echocontrast. Difficult visualization thorughout study. Left ventricular ejection fraction, by estimation, is 35 to 40%. The left ventricle has moderately decreased function. Left ventricular endocardial border not optimally defined to evaluate regional wall motion. Definity contrast agent was given IV to delineate the left ventricular endocardial borders. The left ventricular internal cavity size was mildly dilated. There is no left ventricular hypertrophy. Left ventricular diastolic parameters are indeterminate. Right Ventricle: RV not well visualized. Grossly appears normal in size and function. The right ventricular size is not well visualized. Right vetricular wall thickness was not well visualized. Right ventricular systolic function was not well visualized.  Tricuspid regurgitation signal is inadequate for assessing PA pressure. Left Atrium: Left atrial size was moderately dilated. Right Atrium: Right atrial size was moderately dilated. Pericardium: There is no evidence of pericardial effusion. Mitral Valve: The mitral valve is abnormal. Mild mitral valve regurgitation. No evidence of mitral valve stenosis. Tricuspid Valve: The tricuspid valve is not well visualized. Tricuspid valve regurgitation is not demonstrated. No evidence of tricuspid stenosis. Aortic Valve: The aortic valve was not well visualized. Aortic valve regurgitation is not visualized. No aortic stenosis is present. Aortic valve mean gradient measures 3.2 mmHg. Aortic valve peak gradient measures  6.8 mmHg. Aortic valve area, by VTI measures 2.27 cm. Pulmonic Valve: The pulmonic valve was not well visualized. Pulmonic valve regurgitation is not visualized. No evidence of pulmonic stenosis. Aorta: The aortic root is normal in size and structure. Venous: The inferior  vena cava is normal in size with greater than 50% respiratory variability, suggesting right atrial pressure of 3 mmHg. IAS/Shunts: No atrial level shunt detected by color flow Doppler. Additional Comments: A device lead is visualized in the right atrium and right ventricle.  LEFT VENTRICLE PLAX 2D LVIDd:         5.60 cm   Diastology LVIDs:         4.50 cm   LV e' medial:   6.42 cm/s LV PW:         1.10 cm   LV E/e' medial: 17.3 LV IVS:        1.10 cm LVOT diam:     2.20 cm LV SV:         53 LV SV Index:   23 LVOT Area:     3.80 cm  RIGHT VENTRICLE TAPSE (M-mode): 1.2 cm LEFT ATRIUM              Index        RIGHT ATRIUM           Index LA diam:        3.60 cm  1.53 cm/m   RA Area:     25.50 cm LA Vol (A2C):   83.4 ml  35.31 ml/m  RA Volume:   91.30 ml  38.68 ml/m LA Vol (A4C):   106.0 ml 44.91 ml/m LA Biplane Vol: 109.0 ml 46.18 ml/m  AORTIC VALVE AV Area (Vmax):    2.32 cm AV Area (Vmean):   2.57 cm AV Area (VTI):     2.27 cm AV Vmax:           130.31 cm/s AV Vmean:          82.450 cm/s AV VTI:            0.235 m AV Peak Grad:      6.8 mmHg AV Mean Grad:      3.2 mmHg LVOT Vmax:         79.62 cm/s LVOT Vmean:        55.825 cm/s LVOT VTI:          0.140 m LVOT/AV VTI ratio: 0.60  AORTA Ao Root diam: 3.40 cm MITRAL VALVE MV Area (PHT): 4.31 cm     SHUNTS MV Decel Time: 176 msec     Systemic VTI:  0.14 m MV E velocity: 111.00 cm/s  Systemic Diam: 2.20 cm Dina Rich MD Electronically signed by Dina Rich MD Signature Date/Time: 05/28/2023/3:44:59 PM    Final    DG Chest Port 1 View Result Date: 05/28/2023 CLINICAL DATA:  Shortness of breath and increased dyspnea EXAM: PORTABLE CHEST 1 VIEW COMPARISON:  04/23/2023 FINDINGS: Stable  cardiomegaly. Sternotomy and CABG. Right chest wall ICD. Diffuse interstitial coarsening. No focal consolidation, pleural effusion, or pneumothorax. IMPRESSION: Cardiomegaly with mild pulmonary edema. Electronically Signed   By: Minerva Fester M.D.   On: 05/28/2023 03:33    Catarina Hartshorn, DO  [facility]  If 7PM-7AM, please contact night-coverage [email or web address] Password [email or web address] 05/29/2023, 12:20 PM   LOS: 1 day

## 2023-05-30 ENCOUNTER — Telehealth: Payer: Self-pay

## 2023-05-30 ENCOUNTER — Inpatient Hospital Stay (HOSPITAL_COMMUNITY)

## 2023-05-30 ENCOUNTER — Encounter (HOSPITAL_COMMUNITY): Payer: Self-pay | Admitting: Family Medicine

## 2023-05-30 DIAGNOSIS — R55 Syncope and collapse: Secondary | ICD-10-CM | POA: Diagnosis not present

## 2023-05-30 DIAGNOSIS — I4819 Other persistent atrial fibrillation: Secondary | ICD-10-CM | POA: Diagnosis not present

## 2023-05-30 DIAGNOSIS — I5023 Acute on chronic systolic (congestive) heart failure: Secondary | ICD-10-CM | POA: Diagnosis not present

## 2023-05-30 DIAGNOSIS — Z95810 Presence of automatic (implantable) cardiac defibrillator: Secondary | ICD-10-CM | POA: Diagnosis not present

## 2023-05-30 LAB — BASIC METABOLIC PANEL WITH GFR
Anion gap: 9 (ref 5–15)
BUN: 23 mg/dL (ref 8–23)
CO2: 25 mmol/L (ref 22–32)
Calcium: 9 mg/dL (ref 8.9–10.3)
Chloride: 102 mmol/L (ref 98–111)
Creatinine, Ser: 0.99 mg/dL (ref 0.61–1.24)
GFR, Estimated: >60 mL/min (ref >60)
Glucose, Bld: 97 mg/dL (ref 70–99)
Potassium: 4.2 mmol/L (ref 3.5–5.1)
Sodium: 136 mmol/L (ref 135–145)

## 2023-05-30 LAB — MAGNESIUM: Magnesium: 1.8 mg/dL (ref 1.7–2.4)

## 2023-05-30 MED ORDER — FUROSEMIDE 10 MG/ML IJ SOLN
20.0000 mg | Freq: Once | INTRAMUSCULAR | Status: AC
Start: 2023-05-30 — End: 2023-05-30
  Administered 2023-05-30: 20 mg via INTRAVENOUS
  Filled 2023-05-30: qty 2

## 2023-05-30 MED ORDER — MAGNESIUM SULFATE 2 GM/50ML IV SOLN
2.0000 g | Freq: Once | INTRAVENOUS | Status: AC
  Administered 2023-05-30: 2 g via INTRAVENOUS
  Filled 2023-05-30: qty 50

## 2023-05-30 MED ORDER — METOPROLOL SUCCINATE ER 25 MG PO TB24
12.5000 mg | ORAL_TABLET | Freq: Every day | ORAL | Status: DC
  Administered 2023-05-30: 12.5 mg via ORAL
  Filled 2023-05-30 (×2): qty 1

## 2023-05-30 NOTE — Progress Notes (Addendum)
 Heart Failure Nurse Navigator Progress Note  PCP: Mattie Marlin, DO PCP-Cardiologist: Dr. [name], MD Admission Diagnosis: Acute on chronic congestive heart failure, unspecified heart failure type [facility]) Admitted from: Home to [facility]  Patient interview and education conducted from [facility] via telephone.  Patient name and DOB confirmed prior to conversation.  Nursing staff provided patient with Heart Failure Education folder "Living Better with Heart Failure" prior to encounter.  Presentation:   Douglas [name] presented with shortness of breath and generalized weakness. Felling short winded and diaphoretic.  Endorsed chest pain described as heartburn and a feeling like he was going to pass out briefly. BNP-557.0. Troponin 21. Chest x-ray: Cardiomegaly with mild pulmonary edema.  ECHO/ LVEF: 35-40%  Clinical Course:  Past Medical History:  Diagnosis Date   A-fib (HCC)    AICD (automatic cardioverter/defibrillator) present    Anemia    Asthma    CAD (coronary artery disease)    CHF (congestive heart failure) (HCC)    Diabetes mellitus without complication (HCC)    Dyslipidemia    Elevated cholesterol    Hypertension      Social History   Socioeconomic History   Marital status: Divorced    Spouse name: Not on file   Number of children: Not on file   Years of education: Not on file   Highest education level: Not on file  Occupational History   Not on file  Tobacco Use   Smoking status: Never   Smokeless tobacco: Never  Vaping Use   Vaping status: Never Used  Substance and Sexual Activity   Alcohol use: Never   Drug use: Never    Comment: marijuana   Sexual activity: Not on file  Other Topics Concern   Not on file  Social History Narrative   ** Merged History Encounter **       Social Drivers of Health   Financial Resource Strain: Not on file  Food Insecurity: Food Insecurity Present (05/28/2023)   Hunger Vital Sign    Worried About Running Out of Food in the  Last Year: Sometimes true    Ran Out of Food in the Last Year: Sometimes true  Transportation Needs: No Transportation Needs (05/28/2023)   PRAPARE - Administrator, Civil Service (Medical): No    Lack of Transportation (Non-Medical): No  Physical Activity: Not on file  Stress: Not on file  Social Connections: Moderately Isolated (05/28/2023)   Social Connection and Isolation Panel [NHANES]    Frequency of Communication with Friends and Family: Three times a week    Frequency of Social Gatherings with Friends and Family: Once a week    Attends Religious Services: 1 to 4 times per year    Active Member of [employer] or Organizations: No    Attends Engineer, structural: Never    Marital Status: Divorced   Water engineer and Provision:  Detailed education and instructions provided on heart failure disease management including the following:  Signs and symptoms of Heart Failure When to call the physician Importance of daily weights Low sodium diet Fluid restriction Medication management Anticipated future follow-up appointments  Patient education given on each of the above topics.  Patient acknowledges understanding via teach back method and acceptance of all instructions.  Education Materials:  "Living Better With Heart Failure" Booklet, HF zone tool, & Daily Weight Tracker Tool.  Patient has scale at home: Yes Patient has pill box at home: Yes    High Risk Criteria for  Readmission and/or Poor Patient Outcomes: Heart failure hospital admissions (last 6 months): 1  No Show rate: 11% Difficult social situation: Lives by himself-Depression. Demonstrates medication adherence: Unsure.  Per patient he has had difficulties with someone who prepares his medications at home. Primary Language: English Literacy level: Reading, Writing & Comprehension  Barriers of Care:   Recent fall Vision loss in 1 eye  Considerations/Referrals:   Referral made to Heart Failure  Pharmacist Stewardship: Yes Referral made to Heart Failure CSW/NCM TOC: No Referral made to Heart & Vascular TOC clinic: Yes.  Patient prefers TOC follow-up at [facility] in [place].  Items for Follow-up on DC/TOC: Diet & Fluid Restrictions Daily Weights Medication Administration Continued Heart Failure Education  Roxy Horseman, RN, BSN [facility] Heart Failure Navigator Secure Chat Only

## 2023-05-30 NOTE — Progress Notes (Addendum)
 PROGRESS NOTE  JAZEN SPRAGGINS XBJ:[id number] DOB: August 21, 1955 DOA: 05/28/2023 PCP: Mattie Marlin, DO  Brief History:  68 year old male with HFrEF (25-30%), status post ICD placement, history of V. tach, anxiety disorder, CAD, asthma, chronic pain, opioid dependent, GERD, permanent atrial fibrillation on apixaban, hyperlipidemia presented to the emergency department complaining of shortness of breath and weakness.  He reported that his shortness of breath came on acutely and worsened over the past few hours prior to arrival.  He feels diaphoretic.  He briefly felt like he was going to pass out but did not.  He has acid reflux and chest discomfort symptoms.  His ED workup was significant for an elevated BNP 557 and pulmonary edema noted on chest x-ray.  He had a brief episode of nonsustained V. tach in the ED.  His ICD did not activate.  He was given IV Lasix and has started diuresing.  Hospital admission was requested for management of acute heart failure and near syncope.  Cardiology was consulted to assist.   Assessment/Plan: Acute on chronic HFrEF -05/28/2023 echo EF 35-40%, RV not well-visualized, -03/21/2020 echo EF 25-30%, global HK -soft BP has limited diuresis -redose IV furosemide -Appreciate cardiology -Accurate I's and O's -Daily weights--NEG 2.9 L -continue Jardiance -low BP has limited GDMT--Entresto, metoprolol, lasix -sister confims poor compliance with meds at home   Ischemic cardiomyopathy/CAD ICD in place ([employer] since 07/2017) - on GDMT above  - Interrogation reviewed: No recent shocks, longevity 5.8 years -Not on ASA given the need for [facility]  -Trops flat 17 > 21   Permanent atrial fibrillation -holding metoprolol due to soft BPs>>restart 4/3 -continue apixaban   Nonsustained ventricular tachycardia -s/p AICD -optimized electrolytes   Chronic low back pain Opioid dependence -PDMP reviewed-- -percocet 10/325, #180, last refill 05/10/23    Dyslipidemia - rosuvastatin 40 mg daily ordered   Class 2 obesity -BMI 39.59 -lifestyle modification  Cognitive Impairment -sister confirms patient has had gradual memory loss over past 1-2 years  Left groin nodule -no erythema, no pus, no induratiion -suspect dermatofibroma -CT pelvis  Goals of Care -DNR confirmed with sister                 Family Communication:   sister updated 4/3   Consultants:  cardiology   Code Status:  DNR   DVT Prophylaxis:apixaban     Procedures: As Listed in Progress Note Above   Antibiotics: None      Subjective: Pt denies f/c, cp, sob, n/v.  Complain of "knot" in left groin.  Objective: Vitals:   05/29/23 2000 05/30/23 0459 05/30/23 0500 05/30/23 1232  BP: 102/79 105/80  111/82  Pulse: 96 90  (!) 102  Resp: 17     Temp: 98.4 F (36.9 C) 98.3 F (36.8 C)  97.8 F (36.6 C)  TempSrc: Oral Oral  Oral  SpO2: 91% 95%  93%  Weight:   113 kg   Height:        Intake/Output Summary (Last 24 hours) at 05/30/2023 1551 Last data filed at 05/30/2023 1539 Gross per 24 hour  Intake 723 ml  Output 2850 ml  Net -2127 ml   Weight change: -3.574 kg Exam:  General:  Pt is alert, follows commands appropriately, not in acute distress HEENT: No icterus, No thrush, No neck mass, [place]/AT Cardiovascular: IRRR, S1/S2, no rubs, no gallops Respiratory: bibasilar crackles.  No wheeze Abdomen: Soft/+BS, non tender, non distended, no guarding Extremities:  No edema, No lymphangitis, No petechiae, No rashes, no synovitis   Data Reviewed: I have personally reviewed following labs and imaging studies Basic Metabolic Panel: Recent Labs  Lab 05/28/23 0322 05/29/23 0413 05/30/23 0405  NA 137 135 136  K 3.5 3.4* 4.2  CL 103 99 102  CO2 23 28 25   GLUCOSE 114* 99 97  BUN 24* 21 23  CREATININE 1.23 0.92 0.99  CALCIUM 8.5* 8.7* 9.0  MG 1.3* 2.0 1.8   Liver Function Tests: Recent Labs  Lab 05/28/23 0322  AST 31  ALT 22  ALKPHOS 51   BILITOT 0.6  PROT 7.1  ALBUMIN 3.5   Recent Labs  Lab 05/28/23 0322  LIPASE 28   No results for input(s): "AMMONIA" in the last 168 hours. Coagulation Profile: No results for input(s): "INR", "PROTIME" in the last 168 hours. CBC: Recent Labs  Lab 05/28/23 0322  WBC 8.4  HGB 14.7  HCT 45.1  MCV 97.6  PLT 211   Cardiac Enzymes: No results for input(s): "CKTOTAL", "CKMB", "CKMBINDEX", "TROPONINI" in the last 168 hours. BNP: Invalid input(s): "POCBNP" CBG: No results for input(s): "GLUCAP" in the last 168 hours. HbA1C: Recent Labs    05/28/23 0322  HGBA1C 5.9*   Urine analysis:    Component Value Date/Time   COLORURINE YELLOW 05/28/2023 1610   APPEARANCEUR CLEAR 05/28/2023 1610   LABSPEC 1.018 05/28/2023 1610   PHURINE 5.0 05/28/2023 1610   GLUCOSEU >=500 (A) 05/28/2023 1610   HGBUR SMALL (A) 05/28/2023 1610   BILIRUBINUR NEGATIVE 05/28/2023 1610   KETONESUR NEGATIVE 05/28/2023 1610   PROTEINUR 30 (A) 05/28/2023 1610   UROBILINOGEN 0.2 08/25/2013 0122   NITRITE NEGATIVE 05/28/2023 1610   LEUKOCYTESUR NEGATIVE 05/28/2023 1610   Sepsis Labs: @LABRCNTIP (procalcitonin:4,lacticidven:4) )No results found for this or any previous visit (from the past 240 hours).   Scheduled Meds:  apixaban  5 mg Oral BID   citalopram  20 mg Oral Daily   empagliflozin  10 mg Oral Daily   ezetimibe  10 mg Oral Daily   fluticasone  1 spray Each Nare Daily   furosemide  20 mg Intravenous Once   loratadine  10 mg Oral QHS   metoprolol succinate  12.5 mg Oral Daily   mupirocin ointment   Topical BID   pantoprazole  40 mg Oral BID   rosuvastatin  40 mg Oral QPM   sodium chloride flush  3 mL Intravenous Q12H   Continuous Infusions:  Procedures/Studies: DG CHEST PORT 1 VIEW Result Date: 05/29/2023 CLINICAL DATA:  68 year old male with shortness of breath. EXAM: PORTABLE CHEST 1 VIEW COMPARISON:  Portable chest 05/28/2023 and earlier. FINDINGS: Portable AP semi upright view at 0611  hours. More lordotic positioning. Stable cardiomegaly, CABG, right chest AICD. Stable to mildly improved lung volumes. Allowing for portable technique the lungs are clear. No overt edema. No pneumothorax or pleural effusion. Stable visualized osseous structures. Paucity of bowel gas. IMPRESSION: Stable cardiomegaly. No acute cardiopulmonary abnormality. Electronically Signed   By: Odessa Fleming M.D.   On: 05/29/2023 06:44   ECHOCARDIOGRAM COMPLETE Result Date: 05/28/2023    ECHOCARDIOGRAM REPORT   Patient Name:   Douglas [name] Date of Exam: 05/28/2023 Medical Rec #:  [id number]        Height:       69.0 in Accession #:    [id number]       Weight:       273.6 lb Date of Birth:  November 25, 1955  BSA:          2.360 m Patient Age:    67 years         BP:           112/70 mmHg Patient Gender: M                HR:           97 bpm. [facility] Procedure: 2D Echo, Cardiac Doppler, Color Doppler and Intracardiac            Opacification Agent (Both Spectral and Color Flow Doppler were            utilized during procedure). Indications:    CHF-Acute Systolic I50.21  History:        Patient has prior history of Echocardiogram examinations, most                 recent 06/02/2017. Cardiomyopathy and CHF, CAD, Arrythmias:Atrial                 Fibrillation; Risk Factors:Hypertension and Dyslipidemia. AICD                 (automatic cardioverter/defibrillator) present (From Hx).  Sonographer:    Celesta Gentile RCS Referring Phys: [phone] Cleora Fleet  Sonographer Comments: Technically difficult study due to poor echo windows. IMPRESSIONS  1. LV poorly visualized even with the use of echocontrast. Difficult visualization thorughout study. . Left ventricular ejection fraction, by estimation, is 35 to 40%. The left ventricle has moderately decreased function. Left ventricular endocardial border not optimally defined to evaluate regional wall motion. The left ventricular internal cavity size was mildly dilated. Left  ventricular diastolic parameters are indeterminate.  2. RV not well visualized. Grossly appears normal in size and function. . Right ventricular systolic function was not well visualized. The right ventricular size is not well visualized. Tricuspid regurgitation signal is inadequate for assessing PA pressure.  3. Left atrial size was moderately dilated.  4. Right atrial size was moderately dilated.  5. The mitral valve is abnormal. Mild mitral valve regurgitation. No evidence of mitral stenosis.  6. The aortic valve was not well visualized. Aortic valve regurgitation is not visualized. No aortic stenosis is present.  7. The inferior vena cava is normal in size with greater than 50% respiratory variability, suggesting right atrial pressure of 3 mmHg. FINDINGS  Left Ventricle: LV poorly visualized even with the use of echocontrast. Difficult visualization thorughout study. Left ventricular ejection fraction, by estimation, is 35 to 40%. The left ventricle has moderately decreased function. Left ventricular endocardial border not optimally defined to evaluate regional wall motion. Definity contrast agent was given IV to delineate the left ventricular endocardial borders. The left ventricular internal cavity size was mildly dilated. There is no left ventricular hypertrophy. Left ventricular diastolic parameters are indeterminate. Right Ventricle: RV not well visualized. Grossly appears normal in size and function. The right ventricular size is not well visualized. Right vetricular wall thickness was not well visualized. Right ventricular systolic function was not well visualized.  Tricuspid regurgitation signal is inadequate for assessing PA pressure. Left Atrium: Left atrial size was moderately dilated. Right Atrium: Right atrial size was moderately dilated. Pericardium: There is no evidence of pericardial effusion. Mitral Valve: The mitral valve is abnormal. Mild mitral valve regurgitation. No evidence of mitral valve  stenosis. Tricuspid Valve: The tricuspid valve is not well visualized. Tricuspid valve regurgitation is not demonstrated. No evidence of tricuspid stenosis. Aortic  Valve: The aortic valve was not well visualized. Aortic valve regurgitation is not visualized. No aortic stenosis is present. Aortic valve mean gradient measures 3.2 mmHg. Aortic valve peak gradient measures 6.8 mmHg. Aortic valve area, by VTI measures 2.27 cm. Pulmonic Valve: The pulmonic valve was not well visualized. Pulmonic valve regurgitation is not visualized. No evidence of pulmonic stenosis. Aorta: The aortic root is normal in size and structure. Venous: The inferior vena cava is normal in size with greater than 50% respiratory variability, suggesting right atrial pressure of 3 mmHg. IAS/Shunts: No atrial level shunt detected by color flow Doppler. Additional Comments: A device lead is visualized in the right atrium and right ventricle.  LEFT VENTRICLE PLAX 2D LVIDd:         5.60 cm   Diastology LVIDs:         4.50 cm   LV e' medial:   6.42 cm/s LV PW:         1.10 cm   LV E/e' medial: 17.3 LV IVS:        1.10 cm LVOT diam:     2.20 cm LV SV:         53 LV SV Index:   23 LVOT Area:     3.80 cm  RIGHT VENTRICLE TAPSE (M-mode): 1.2 cm LEFT ATRIUM              Index        RIGHT ATRIUM           Index LA diam:        3.60 cm  1.53 cm/m   RA Area:     25.50 cm LA Vol (A2C):   83.4 ml  35.31 ml/m  RA Volume:   91.30 ml  38.68 ml/m LA Vol (A4C):   106.0 ml 44.91 ml/m LA Biplane Vol: 109.0 ml 46.18 ml/m  AORTIC VALVE AV Area (Vmax):    2.32 cm AV Area (Vmean):   2.57 cm AV Area (VTI):     2.27 cm AV Vmax:           130.31 cm/s AV Vmean:          82.450 cm/s AV VTI:            0.235 m AV Peak Grad:      6.8 mmHg AV Mean Grad:      3.2 mmHg LVOT Vmax:         79.62 cm/s LVOT Vmean:        55.825 cm/s LVOT VTI:          0.140 m LVOT/AV VTI ratio: 0.60  AORTA Ao Root diam: 3.40 cm MITRAL VALVE MV Area (PHT): 4.31 cm     SHUNTS MV Decel Time: 176  msec     Systemic VTI:  0.14 m MV E velocity: 111.00 cm/s  Systemic Diam: 2.20 cm Dina Rich MD Electronically signed by Dina Rich MD Signature Date/Time: 05/28/2023/3:44:59 PM    Final    DG Chest Port 1 View Result Date: 05/28/2023 CLINICAL DATA:  Shortness of breath and increased dyspnea EXAM: PORTABLE CHEST 1 VIEW COMPARISON:  04/23/2023 FINDINGS: Stable cardiomegaly. Sternotomy and CABG. Right chest wall ICD. Diffuse interstitial coarsening. No focal consolidation, pleural effusion, or pneumothorax. IMPRESSION: Cardiomegaly with mild pulmonary edema. Electronically Signed   By: Minerva Fester M.D.   On: 05/28/2023 03:33    Catarina Hartshorn, DO  [facility]  If 7PM-7AM, please contact night-coverage [email or web address] Password [facility] 05/30/2023, 3:51 PM  LOS: 2 days

## 2023-05-30 NOTE — Evaluation (Signed)
 Physical Therapy Evaluation Patient Details Name: Douglas [name] MRN: [id number] DOB: 1955-12-08 Today's Date: 05/30/2023  History of Present Illness  Douglas [name] is a 68 year old male with chronic systolic heart failure (low EF 20%), status post ICD placement, history of V. tach, anxiety disorder, CAD, asthma, chronic pain, opioid dependent, GERD, chronic atrial fibrillation on apixaban, hyperlipidemia presented to the emergency department early this morning complaining of shortness of breath and weakness.  He reported that his shortness of breath came on acutely and worsened over the past few hours prior to arrival.  He feels diaphoretic.  He briefly felt like he was going to pass out but did not.  He has acid reflux and chest discomfort symptoms.  His ED workup was significant for an elevated BNP and pulmonary edema noted on chest x-ray.  He had a brief episode of nonsustained V. tach in the ED.  His ICD did not activate.  He was given IV Lasix and has started diuresing.  Hospital admission was requested for management of acute heart failure and near syncope.  Clinical Impression  Pt is not confident in his abilities but is able to complete bed mobility with verbal cuing to make it easier; pt trying to do a sit up rather than roll to his side and push up.  Pt ambulated over 100 ft with RW and SBA.  Therapist recommends HH.          If plan is discharge home, recommend the following: Help with stairs or ramp for entrance;Assistance with cooking/housework;A little help with walking and/or transfers   Can travel by private vehicle        Equipment Recommendations None recommended by PT  Recommendations for Other Services       Functional Status Assessment Patient has had a recent decline in their functional status and demonstrates the ability to make significant improvements in function in a reasonable and predictable amount of time.     Precautions / Restrictions  Precautions Precautions: None Restrictions Weight Bearing Restrictions Per Provider Order: No      Mobility  Bed Mobility Overal bed mobility: Modified Independent                  Transfers Overall transfer level: Modified independent Equipment used: Rolling walker (2 wheels)                    Ambulation/Gait   Gait Distance (Feet): 150 Feet Assistive device: Rolling walker (2 wheels) Gait Pattern/deviations: WFL(Within Functional Limits)   Gait velocity interpretation: 1.31 - 2.62 ft/sec, indicative of limited community ambulator            Pertinent Vitals/Pain Pain Assessment Pain Assessment: No/denies pain    Home Living Family/patient expects to be discharged to:: Private residence Living Arrangements: Alone Available Help at Discharge: Friend(s);Available PRN/intermittently Type of Home: House Home Access: Stairs to enter Entrance Stairs-Rails: Can reach both Entrance Stairs-Number of Steps: 3-4 steps   Home Layout: One level Home Equipment: Cane - single Librarian, academic (2 wheels)      Prior Function Prior Level of Function : Independent/Modified Independent             Mobility Comments: Community ambulator without AD, drives, shops ADLs Comments: Reports independence until he fell; he ha fallen 3 times in the past month.     Extremity/Trunk Assessment        Lower Extremity Assessment Lower Extremity Assessment: Overall WFL for tasks assessed;Generalized weakness  Communication   Communication Communication: No apparent difficulties    Cognition Arousal: Alert Behavior During Therapy: WFL for tasks assessed/performed   PT - Cognitive impairments: No apparent impairments                         Following commands: Intact       Cueing Cueing Techniques: Verbal cues     General Comments      Exercises General Exercises - Lower Extremity Ankle Circles/Pumps: 10 reps, Both Quad Sets:  10 reps, Both Heel Slides: 10 reps, Both Hip ABduction/ADduction: 5 reps, Both Straight Leg Raises: 10 reps, Both   Assessment/Plan    PT Assessment Patient needs continued PT services  PT Problem List Decreased strength;Decreased balance       PT Treatment Interventions Therapeutic exercise;Balance training    PT Goals (Current goals can be found in the Care Plan section)       Frequency Min 2X/week        AM-PAC PT "6 Clicks" Mobility  Outcome Measure Help needed turning from your back to your side while in a flat bed without using bedrails?: None Help needed moving from lying on your back to sitting on the side of a flat bed without using bedrails?: A Little Help needed moving to and from a bed to a chair (including a wheelchair)?: A Little Help needed standing up from a chair using your arms (e.g., wheelchair or bedside chair)?: A Little Help needed to walk in hospital room?: None Help needed climbing 3-5 steps with a railing? : A Lot 6 Click Score: 19    End of Session Equipment Utilized During Treatment: Gait belt Activity Tolerance: Patient tolerated treatment well Patient left: in chair;with nursing/sitter in room;with chair alarm set   PT Visit Diagnosis: Muscle weakness (generalized) (M62.81);History of falling (Z91.81)    Time: 2956-2130 PT Time Calculation (min) (ACUTE ONLY): 30 min   Charges:   PT Evaluation $PT Eval Low Complexity: 1 Low PT Treatments $Therapeutic Exercise: 8-22 mins PT General Charges $$ ACUTE PT VISIT: 1 Visit        Virgina Organ, PT CLT [phone]  05/30/2023, 9:45 AM

## 2023-05-30 NOTE — TOC Progression Note (Signed)
 Transition of Care [facility]) - Progression Note    Patient Details  Name: Douglas [name] MRN: [id number] Date of Birth: Jun 30, 1955  Transition of Care [facility]) CM/SW Contact  Isabella Bowens, [place] Phone Number: 05/30/2023, 10:21 AM  Clinical Narrative:    PT recommended HHPT , but pt is insisting that he needs rehab since he lives alone and needs to gain back his strength with walking. Patient did walk 129ft and it was shared with pt that his insurance would have to approve, pt understood. This Clinical research associate then went over bed offers. After reviewing bed offers, patient accepted offer with [facility] since it is local in [place]. This Clinical research associate shared with Eunice Blase that patient accepted bed offer . [facility] has been started. TOC will continue to follow.   Expected Discharge Plan: Skilled Nursing Facility Barriers to Discharge: Continued Medical Work up, English as a second language teacher  Expected Discharge Plan and Services       Living arrangements for the past 2 months: Single Family Home                      Social Determinants of Health (SDOH) Interventions SDOH Screenings   Food Insecurity: Food Insecurity Present (05/28/2023)  Housing: Low Risk  (05/28/2023)  Transportation Needs: No Transportation Needs (05/28/2023)  Utilities: Not At Risk (05/28/2023)  Social Connections: Moderately Isolated (05/28/2023)  Tobacco Use: Low Risk  (05/28/2023)    Readmission Risk Interventions    05/30/2023   10:20 AM  Readmission Risk Prevention Plan  Transportation Screening Complete  Home Care Screening Complete  Medication Review (RN CM) Complete

## 2023-05-30 NOTE — Plan of Care (Signed)
  Problem: Education: Goal: Knowledge of General Education information will improve Description: Including pain rating scale, medication(s)/side effects and non-pharmacologic comfort measures Outcome: Progressing   Problem: Health Behavior/Discharge Planning: Goal: Ability to manage health-related needs will improve Outcome: Progressing   Problem: Clinical Measurements: Goal: Ability to maintain clinical measurements within normal limits will improve Outcome: Progressing Goal: Will remain free from infection Outcome: Progressing Goal: Diagnostic test results will improve Outcome: Progressing Goal: Cardiovascular complication will be avoided Outcome: Progressing   Problem: Nutrition: Goal: Adequate nutrition will be maintained Outcome: Progressing   Problem: Coping: Goal: Level of anxiety will decrease Outcome: Progressing   Problem: Elimination: Goal: Will not experience complications related to bowel motility Outcome: Progressing Goal: Will not experience complications related to urinary retention Outcome: Progressing   Problem: Pain Managment: Goal: General experience of comfort will improve and/or be controlled Outcome: Progressing   Problem: Safety: Goal: Ability to remain free from injury will improve Outcome: Progressing   Problem: Skin Integrity: Goal: Risk for impaired skin integrity will decrease Outcome: Progressing

## 2023-05-30 NOTE — Telephone Encounter (Signed)
 Entered in error

## 2023-05-30 NOTE — Progress Notes (Addendum)
 Rounding Note    Patient Name: Douglas [name] Date of Encounter: 05/30/2023  [facility] Cardiologist: Douglas Rotunda, MD   Subjective   This am, denies any CP, SOB, or palpitations. Reports ongoing dizziness when standing up and still needs to work on balance. He is okay with being transferred to SNF as sister requested. Report good urine output as well.   Inpatient Medications    Scheduled Meds:  apixaban  5 mg Oral BID   citalopram  20 mg Oral Daily   empagliflozin  10 mg Oral Daily   ezetimibe  10 mg Oral Daily   fluticasone  1 spray Each Nare Daily   furosemide  20 mg Intravenous Once   loratadine  10 mg Oral QHS   mupirocin ointment   Topical BID   pantoprazole  40 mg Oral BID   rosuvastatin  40 mg Oral QPM   sodium chloride flush  3 mL Intravenous Q12H   Continuous Infusions:  PRN Meds: acetaminophen, ALPRAZolam, oxyCODONE-acetaminophen **AND** oxyCODONE, sodium chloride flush, tiZANidine, traZODone   Vital Signs    Vitals:   05/29/23 1245 05/29/23 2000 05/30/23 0459 05/30/23 0500  BP: 99/64 102/79 105/80   Pulse: 88 96 90   Resp: 17 17    Temp:  98.4 F (36.9 C) 98.3 F (36.8 C)   TempSrc:  Oral Oral   SpO2: 92% 91% 95%   Weight:    113 kg  Height:        Intake/Output Summary (Last 24 hours) at 05/30/2023 0743 Last data filed at 05/30/2023 0506 Gross per 24 hour  Intake 723 ml  Output 2150 ml  Net -1427 ml      05/30/2023    5:00 AM 05/29/2023    5:00 AM 05/28/2023    8:53 AM  Last 3 Weights  Weight (lbs) 249 lb 1.9 oz 268 lb 1.3 oz 257 lb  Weight (kg) 113 kg 121.6 kg 116.574 kg      Telemetry    Afib, HR mainly 90's, some 100's - Personally Reviewed  Physical Exam   GEN: Sitting upright in bed with No acute distress. Staff at bedside.  Neck: No JVD Cardiac: Irreg. Irreg., no murmurs, rubs, or gallops.  Respiratory: diminished bilateral bases GI: Soft, nontender, non-distended  MS: No edema; No deformity. Neuro:  Nonfocal   Psych: Normal affect   Labs    High Sensitivity Troponin:   Recent Labs  Lab 05/28/23 0322 05/28/23 0527  TROPONINIHS 17 21*     Chemistry Recent Labs  Lab 05/28/23 0322 05/29/23 0413 05/30/23 0405  NA 137 135 136  K 3.5 3.4* 4.2  CL 103 99 102  CO2 23 28 25   GLUCOSE 114* 99 97  BUN 24* 21 23  CREATININE 1.23 0.92 0.99  CALCIUM 8.5* 8.7* 9.0  MG 1.3* 2.0 1.8  PROT 7.1  --   --   ALBUMIN 3.5  --   --   AST 31  --   --   ALT 22  --   --   ALKPHOS 51  --   --   BILITOT 0.6  --   --   GFRNONAA >60 >60 >60  ANIONGAP 11 8 9     Lipids  Recent Labs  Lab 05/29/23 0413  CHOL 99  TRIG 96  HDL 38*  LDLCALC 42  CHOLHDL 2.6    Hematology Recent Labs  Lab 05/28/23 0322  WBC 8.4  RBC 4.62  HGB 14.7  HCT 45.1  MCV 97.6  MCH 31.8  MCHC 32.6  RDW 16.3*  PLT 211   Thyroid No results for input(s): "TSH", "FREET4" in the last 168 hours.  BNP Recent Labs  Lab 05/28/23 0322 05/29/23 0413  BNP 557.0* 672.0*    DDimer No results for input(s): "DDIMER" in the last 168 hours.   Radiology    DG CHEST PORT 1 VIEW Result Date: 05/29/2023 CLINICAL DATA:  68 year old male with shortness of breath. EXAM: PORTABLE CHEST 1 VIEW COMPARISON:  Portable chest 05/28/2023 and earlier. FINDINGS: Portable AP semi upright view at 0611 hours. More lordotic positioning. Stable cardiomegaly, CABG, right chest AICD. Stable to mildly improved lung volumes. Allowing for portable technique the lungs are clear. No overt edema. No pneumothorax or pleural effusion. Stable visualized osseous structures. Paucity of bowel gas. IMPRESSION: Stable cardiomegaly. No acute cardiopulmonary abnormality. Electronically Signed   By: Douglas [name] M.D.   On: 05/29/2023 06:44    Cardiac Studies   ECHO IMPRESSIONS 05/28/2023 1. LV poorly visualized even with the use of echocontrast. Difficult  visualization thorughout study. . Left ventricular ejection fraction, by  estimation, is 35 to 40%. The left ventricle  has moderately decreased  function. Left ventricular endocardial  border not optimally defined to evaluate regional wall motion. The left  ventricular internal cavity size was mildly dilated. Left ventricular  diastolic parameters are indeterminate.   2. RV not well visualized. Grossly appears normal in size and function. .  Right ventricular systolic function was not well visualized. The right  ventricular size is not well visualized. Tricuspid regurgitation signal is  inadequate for assessing PA  pressure.   3. Left atrial size was moderately dilated.   4. Right atrial size was moderately dilated.   5. The mitral valve is abnormal. Mild mitral valve regurgitation. No  evidence of mitral stenosis.   6. The aortic valve was not well visualized. Aortic valve regurgitation  is not visualized. No aortic stenosis is present.   7. The inferior vena cava is normal in size with greater than 50%  respiratory variability, suggesting right atrial pressure of 3 mmHg.    ECHO SUMMARY 09/2021 with [facility]  Left ventricular systolic function is severely reduced.  There is severe global hypokinesis of the left ventricle.  Regional wall motion abnormalities cannot be excluded due to limited  visualization.  LV ejection fraction = 25-30%.  The right ventricle is borderline dilated.  The right ventricular systolic function is moderate to severely  reduced.  The right atrium is mildly to moderately dilated.  There is moderate mitral regurgitation.  There is mild tricuspid regurgitation.  Estimated right ventricular systolic pressure is 47.1 mmHg.  Moderate pulmonary hypertension.  Probably no significant change in comparison with the prior study  noted    CATH 2019 at [facility]  #1. Severe 3 - vessel native coronary artery disease  #2. 4/4 patent bypass grafts; 40 - 60% stenosis in body of SVG to OM-2  #3. Mildly elevated filling pressures with cardiac index of 2.7  Recommendations:  #1. Further  management per Dr. Truett [name] and Team   Patient Profile     Douglas [name] is a 68 y.o. male with a hx of HFrEF (EF 10-15% in 05/2017, now 35-40%), V. Tach s/p ICD placement 07/2017, CAD s/p PCI (1996) and 4v CABG (2002 with Cath 2019 at [facility] 4/4 patent bypass grafts; 40 - 60% stenosis in body of SVG to OM-2  patent LIMA-LAD, SVG-OM2/OM3 with med mgmt),  HTN, HLD, permanent atrial fibrillation on apixaban, asthma, anxiety disorder, chronic pain, opioid dependent, GERD, who is being seen 05/29/2023 for the evaluation of heart failure and presynope at the request of Dr. Arbutus Leas.   Assessment & Plan    Acute HFrEF Reported SOB is related to asthma with increase in pollen. Does not have access to inhalers. BNP 557 > 672 (vs 253 on 03/2023). CXR suggestive of HF.  - Echo this admission showed improved EF 35 to 40% compared to 20-25% in 09/2021 and 10 to 15% in 05/2017 - Continue on Jardiance 10 mg. On hold due to hypotension this am Toprol XL 50 mg, IV furosemide 40 mg BID, Entresto 49-51 mg BID, Spironolactone 25 mg. Adding Toprol XL 12.5 mg this am with improved BP and HR in 90's in setting of afib. Will continue to hold remaining GDMT and follow hold parameters. Will add back medications as BP and HR allows at low doses and titrate.  - Poor compliance with meds at home per phone call with family notes several medicine bottle at the home that are not used. - net I/O: -2906, wt 257 > 249 (previously recorded as 268 lbs 01/2023), Cr: 0.99, appear euvolemic on exam, obtain REDS vest - Continue to monitor daily weights, I's/O's, renal function   Near syncope - Denies any symptoms prior to this visit, however, did have fall in 03/2023 related to hypotension.   - Less likely ischemic related with flat Tn, no ischemic EKG changes and negative wall motion abnormality on ECHO.  - this am, reports dizziness with standing and on ongoing balance issues.  - Suspect related to orthostatic hypotension with this admission's BP 's    - Medications adjusted as above  - ordered orthostatic vitals    CAD  Chest pain  - s/p PCI (1996) and 4v CABG (2002 with Cath 2019 at [facility] 4/4 patent bypass grafts; 40 - 60% stenosis in body of SVG to OM-2  patent LIMA-LAD, SVG-OM2/OM3 with med mgmt -  Continue to remain chest pain free. Flat Tn, no ischemic EKG changes, nonsymptomatic. Less likely ischemic related. - Continue statin. Not on ASA given the need for [facility].    Ischemic cardiomyopathy ICD in place ([employer] since 07/2017) - managed by GDMT above  - Interrogation reviewed: No recent shocks, longevity 5.8 years   NSVT - AICD in place, Stable per interrogation reviewed above - Treated with Mag & K supplement. K: 4.2 Mg: 1.8 this am - Goal: K= > 4, Mg >2 - ordered Mg sulfate this am   Permanent A-fib PVC - Afib controlled, HR 90's with PVC's on telemetry  - Currently apixaban 5 mg  - Can consider outpatient monitor for PVC burden  - Restarted Toprol 12.5 mg this am   HTN - BP this am: 105/ 80 - improved from yesterday but still soft.  - Holding BP medications per HF GDMT above. Restarted Toprol XL at 12.5  mg.  - Was receiving Oxycodone at admission but currently on hold. May have been contributing to hypotension.   HLD - LDL: 42, goal < 55 - Currently on rosuvastatin 40 Mg and Zetia 10 mg   Otherwise managed by primary -Chronic low back pain -Opioid dependence -Chronic allergic rhinitis     For questions or updates, please contact [facility] Please consult [email or web address] for contact info under        Signed, Basilio Cairo, PA-C  05/30/2023, 7:43 AM     Attending Note  Patient seen and discussed with PA Luan Pulling, I agree with her documentation  1.Acute on chronic HFrEF -- Echo: LVEF 35-40%  -family phone note reports when they went to his house several medicine bottles that have not been used - BNP 557, CXR mild edema.   -diuresis limited by low bp's yesterday. I/Os incomplete, from  charting he was negative 1.4 L yesterday and 2.9 L since admission. Cr has trended down with diuresis consistent with venous congestion and HF. BP's improved but still soft, dose IV lasix 20mg  just once today for some ongoing fluid overload.  - add back toprol 12.5mg  daily. Continue to hold other home HF meds due to soft bp's, has remianed on jardiance 10mg  daily.    2.CAD - prior CABG, interventions as listed above. - no evidence of acute issues this admission   3.Permanent afib - adding back toprol at lower doser of 12.5mg  daily due to low bp's, follow rates.  - continue eliquis  4, Near syncope - benign device check on admission - likely due to low bp's - check orthsotatics  Dina Rich MD

## 2023-05-30 NOTE — Plan of Care (Signed)
  Problem: Acute Rehab PT Goals(only PT should resolve) Goal: Pt Will Go Supine/Side To Sit Flowsheets (Taken 05/30/2023 0943) Pt will go Supine/Side to Sit: with modified independence Goal: Patient Will Transfer Sit To/From Stand Flowsheets (Taken 05/30/2023 [phone]) Patient will transfer sit to/from stand: with modified independence Goal: Pt Will Ambulate Flowsheets (Taken 05/30/2023 0943) Pt will Ambulate:  > 125 feet  with modified independence

## 2023-05-31 DIAGNOSIS — I4819 Other persistent atrial fibrillation: Secondary | ICD-10-CM | POA: Diagnosis not present

## 2023-05-31 DIAGNOSIS — Z95810 Presence of automatic (implantable) cardiac defibrillator: Secondary | ICD-10-CM | POA: Diagnosis not present

## 2023-05-31 DIAGNOSIS — I5023 Acute on chronic systolic (congestive) heart failure: Secondary | ICD-10-CM | POA: Diagnosis not present

## 2023-05-31 DIAGNOSIS — R55 Syncope and collapse: Secondary | ICD-10-CM | POA: Diagnosis not present

## 2023-05-31 LAB — CBC
HCT: 47.8 % (ref 39.0–52.0)
Hemoglobin: 15.1 g/dL (ref 13.0–17.0)
MCH: 31.1 pg (ref 26.0–34.0)
MCHC: 31.6 g/dL (ref 30.0–36.0)
MCV: 98.6 fL (ref 80.0–100.0)
Platelets: 193 10*3/uL (ref 150–400)
RBC: 4.85 MIL/uL (ref 4.22–5.81)
RDW: 15.6 % — ABNORMAL HIGH (ref 11.5–15.5)
WBC: 8.9 10*3/uL (ref 4.0–10.5)
nRBC: 0 % (ref 0.0–0.2)

## 2023-05-31 LAB — MAGNESIUM: Magnesium: 2 mg/dL (ref 1.7–2.4)

## 2023-05-31 LAB — BASIC METABOLIC PANEL WITH GFR
Anion gap: 10 (ref 5–15)
BUN: 23 mg/dL (ref 8–23)
CO2: 25 mmol/L (ref 22–32)
Calcium: 9 mg/dL (ref 8.9–10.3)
Chloride: 99 mmol/L (ref 98–111)
Creatinine, Ser: 1.17 mg/dL (ref 0.61–1.24)
GFR, Estimated: >60 mL/min (ref >60)
Glucose, Bld: 99 mg/dL (ref 70–99)
Potassium: 4.2 mmol/L (ref 3.5–5.1)
Sodium: 134 mmol/L — ABNORMAL LOW (ref 135–145)

## 2023-05-31 MED ORDER — METOPROLOL SUCCINATE ER 25 MG PO TB24: 12.5000 mg | ORAL_TABLET | Freq: Every day | ORAL | 1 refills | Status: DC

## 2023-05-31 MED ORDER — TORSEMIDE 20 MG PO TABS: 20.0000 mg | ORAL_TABLET | Freq: Every day | ORAL | 0 refills | Status: DC

## 2023-05-31 MED ORDER — CEPHALEXIN 500 MG PO CAPS: 500.0000 mg | ORAL_CAPSULE | Freq: Three times a day (TID) | ORAL | 0 refills | Status: DC

## 2023-05-31 MED ORDER — DOXYCYCLINE HYCLATE 100 MG PO TABS: 100.0000 mg | ORAL_TABLET | Freq: Two times a day (BID) | ORAL | Status: DC

## 2023-05-31 MED ORDER — CEPHALEXIN 500 MG PO CAPS: 500.0000 mg | ORAL_CAPSULE | Freq: Three times a day (TID) | ORAL | Status: DC

## 2023-05-31 MED ORDER — TORSEMIDE 20 MG PO TABS: 20.0000 mg | ORAL_TABLET | Freq: Every day | ORAL | Status: DC

## 2023-05-31 MED ORDER — DOXYCYCLINE HYCLATE 100 MG PO TABS: 100.0000 mg | ORAL_TABLET | Freq: Two times a day (BID) | ORAL | 0 refills | Status: DC

## 2023-05-31 NOTE — Discharge Summary (Signed)
 Physician Discharge Summary   Patient: Douglas [name] MRN: [id number] DOB: [birth year]  Admit date:     05/28/2023  Discharge date: 05/31/23  Discharge Physician: Onalee Hua Cleburn Maiolo   PCP: Mattie Marlin, DO   Recommendations at discharge:   Please follow up with primary care provider within 1-2 weeks  Please repeat BMP and CBC in one week    Hospital Course: 68 year old male with HFrEF (25-30%), status post ICD placement, history of V. tach, anxiety disorder, CAD, asthma, chronic pain, opioid dependent, GERD, permanent atrial fibrillation on apixaban, hyperlipidemia presented to the emergency department complaining of shortness of breath and weakness.  He reported that his shortness of breath came on acutely and worsened over the past few hours prior to arrival.  He feels diaphoretic.  He briefly felt like he was going to pass out but did not.  He has acid reflux and chest discomfort symptoms.  His ED workup was significant for an elevated BNP 557 and pulmonary edema noted on chest x-ray.  He had a brief episode of nonsustained V. tach in the ED.  His ICD did not activate.  He was given IV Lasix and has started diuresing.  Hospital admission was requested for management of acute heart failure and near syncope.  Cardiology was consulted to assist.  Assessment and Plan: Acute on chronic HFrEF -05/28/2023 echo EF 35-40%, RV not well-visualized, -03/21/2020 echo EF 25-30%, global HK -soft BP has limited diuresis -redose IV furosemide 4/3 -Appreciate cardiology -Accurate I's and O's--neg 4.4 L -Daily weights--NEG  10 lbs;  d/c weight 247.4 lbs -continue Jardiance -low BP has limited GDMT--Entresto, metoprolol, lasix -sister confims poor compliance with meds at home -d/c home with torsemide 20 mg daily, metoprolol succinate 12.5 mg daily.  Entresto and spiro discontinued due to low BPs   Ischemic cardiomyopathy/CAD ICD in place ([employer] since 07/2017) - on GDMT above  - Interrogation reviewed: No  recent shocks, longevity 5.8 years -Not on ASA given the need for [facility]  -Trops flat 17 > 21   Permanent atrial fibrillation -holding metoprolol due to soft BPs>>restart 4/3 -continue apixaban -d/c home with lower dose metoprolol succinate 12.5 mg dialy   Nonsustained ventricular tachycardia -s/p AICD -optimized electrolytes   Chronic low back pain Opioid dependence -PDMP reviewed-- -percocet 10/325, #180, last refill 05/10/23   Dyslipidemia - rosuvastatin 40 mg daily ordered   Class 2 obesity -BMI 39.59 -lifestyle modification   Cognitive Impairment -sister confirms patient has had gradual memory loss over past 1-2 years   Left groin nodule -no erythema, no pus, no induratiion -suspect dermatofibroma -CT pelvis--no concerning features for infection -d/c home with doxy and cephalexin x 7 days   Goals of Care -DNR confirmed with sister           Consultants: cardiology Procedures performed: none  Disposition: Home Diet recommendation:  Cardiac diet DISCHARGE MEDICATION: Allergies as of 05/31/2023       Reactions   Codeine Hives, Itching   Xarelto [rivaroxaban] Other (See Comments)   Bleeding ulcers   Lasix [furosemide] Other (See Comments)   Patient says he gets light headed and dizzy.    Vioxx [rofecoxib] Palpitations, Other (See Comments)   Caused massive heart attack        Medication List     STOP taking these medications    Entresto 97-103 MG Generic drug: sacubitril-valsartan   spironolactone 25 MG tablet Commonly known as: ALDACTONE       TAKE these medications  ALPRAZolam 1 MG tablet Commonly known as: XANAX Take 1-2 mg by mouth 2 (two) times daily as needed for anxiety.   ARTIFICIAL TEARS OP Place 1 drop into the right eye 3 (three) times daily.   cephALEXin 500 MG capsule Commonly known as: KEFLEX Take 1 capsule (500 mg total) by mouth every 8 (eight) hours.   citalopram 20 MG tablet Commonly known as: CELEXA Take 20 mg  by mouth daily.   doxycycline 100 MG tablet Commonly known as: VIBRA-TABS Take 1 tablet (100 mg total) by mouth every 12 (twelve) hours.   Eliquis 5 MG Tabs tablet Generic drug: apixaban TAKE ONE TABLET BY MOUTH TWICE DAILY What changed: how much to take   ezetimibe 10 MG tablet Commonly known as: ZETIA Take 10 mg by mouth daily.   Fish Oil 1200 MG Caps Take 1,200 mg by mouth daily.   fluticasone 50 MCG/ACT nasal spray Commonly known as: FLONASE Place 1 spray into both nostrils daily.   Jardiance 10 MG Tabs tablet Generic drug: empagliflozin Take 10 mg by mouth daily.   lansoprazole 30 MG capsule Commonly known as: PREVACID Take 30 mg by mouth daily.   loratadine 10 MG tablet Commonly known as: CLARITIN Take 10 mg by mouth daily.   metoprolol succinate 25 MG 24 hr tablet Commonly known as: TOPROL-XL Take 0.5 tablets (12.5 mg total) by mouth daily. Start taking on: June 01, 2023 What changed:  medication strength how much to take additional instructions Another medication with the same name was removed. Continue taking this medication, and follow the directions you see here.   multivitamin with minerals Tabs tablet Take 1 tablet by mouth daily. And balance of nature   oxyCODONE-acetaminophen 10-325 MG tablet Commonly known as: PERCOCET Take 1 tablet by mouth every 6 (six) hours as needed for pain.   rosuvastatin 40 MG tablet Commonly known as: CRESTOR TAKE ONE (1) TABLET EACH DAY   tiZANidine 4 MG tablet Commonly known as: ZANAFLEX Take 4 mg by mouth every 8 (eight) hours as needed for muscle spasms.   torsemide 20 MG tablet Commonly known as: DEMADEX Take 1 tablet (20 mg total) by mouth daily. Start taking on: June 01, 2023 What changed: See the new instructions.        Contact information for follow-up providers     [facility]. Go on 06/06/2023.   Specialty: Cardiology Why: Hospital Follow-Up- 06/06/23  @ 2:00PM Please bring all medications to follow-up appointment At the [facility] [address] [phone]) Contact information: [address] [place] [address] [phone]             Contact information for after-discharge care     Destination     [facility] .   Service: Skilled Nursing Contact information: [address] [place] [address] [phone]                    Discharge Exam: Filed Weights   05/29/23 0500 05/30/23 0500 05/31/23 0500  Weight: 121.6 kg 113 kg 112.2 kg   HEENT:  [place]/AT, No thrush, no icterus CV:  RRR, no rub, no S3, no S4 Lung:  CTA, no wheeze, no rhonchi Abd:  soft/+BS, NT Ext:  No edema, no lymphangitis, no synovitis, no rash   Condition at discharge: stable  The results  of significant diagnostics from this hospitalization (including imaging, microbiology, ancillary and laboratory) are listed below for reference.   Imaging Studies: CT PELVIS WO CONTRAST Result Date: 05/31/2023 CLINICAL DATA:  Left groin nodule. EXAM: CT PELVIS WITHOUT CONTRAST TECHNIQUE: Multidetector CT imaging of the pelvis was performed following the standard protocol without intravenous contrast. RADIATION DOSE REDUCTION: This exam was performed according to the departmental dose-optimization program which includes automated exposure control, adjustment of the mA and/or kV according to patient size and/or use of iterative reconstruction technique. COMPARISON:  None Available. FINDINGS: Urinary Tract:  Urinary bladder unremarkable. Bowel: Visualized small bowel loops in colonic segments of the pelvis are nondilated there is some diverticular disease in the sigmoid colon without diverticulitis. Vascular/Lymphatic: Moderate atherosclerotic calcification. No pelvic sidewall lymphadenopathy. No  evidence for lymphadenopathy in either groin region although the inferior portion of the groin regions is not been included on the study. Reproductive: The prostate gland and seminal vesicles are unremarkable. Other:  No free fluid in the pelvis. Musculoskeletal: No worrisome lytic or sclerotic osseous abnormality. IMPRESSION: 1. Exam does not include the lower groin regions bilaterally. While there is no evidence for lymphadenopathy, soft tissue mass, hernia, or infection in the region of the left groin, if there is clinical concern for involvement of the inferior portion of the left groin into the peritoneum or proximal left thigh, repeat imaging will be required. 2. Sigmoid diverticulosis without diverticulitis. Electronically Signed   By: Kennith Center M.D.   On: 05/31/2023 05:46   DG CHEST PORT 1 VIEW Result Date: 05/29/2023 CLINICAL DATA:  68 year old male with shortness of breath. EXAM: PORTABLE CHEST 1 VIEW COMPARISON:  Portable chest 05/28/2023 and earlier. FINDINGS: Portable AP semi upright view at 0611 hours. More lordotic positioning. Stable cardiomegaly, CABG, right chest AICD. Stable to mildly improved lung volumes. Allowing for portable technique the lungs are clear. No overt edema. No pneumothorax or pleural effusion. Stable visualized osseous structures. Paucity of bowel gas. IMPRESSION: Stable cardiomegaly. No acute cardiopulmonary abnormality. Electronically Signed   By: Odessa Fleming M.D.   On: 05/29/2023 06:44   ECHOCARDIOGRAM COMPLETE Result Date: 05/28/2023    ECHOCARDIOGRAM REPORT   Patient Name:   Douglas [name] Date of Exam: 05/28/2023 Medical Rec #:  [id number]        Height:       69.0 in Accession #:    [id number]       Weight:       273.6 lb Date of Birth:  26-Feb-1956        BSA:          2.360 m Patient Age:    67 years         BP:           112/70 mmHg Patient Gender: M                HR:           97 bpm. [facility] Procedure: 2D Echo, Cardiac Doppler, Color Doppler and  Intracardiac            Opacification Agent (Both Spectral and Color Flow Doppler were            utilized during procedure). Indications:    CHF-Acute Systolic I50.21  History:        Patient has prior history of Echocardiogram examinations, most                 recent 06/02/2017. Cardiomyopathy  and CHF, CAD, Arrythmias:Atrial                 Fibrillation; Risk Factors:Hypertension and Dyslipidemia. AICD                 (automatic cardioverter/defibrillator) present (From Hx).  Sonographer:    Celesta Gentile RCS Referring Phys: [phone] Cleora Fleet  Sonographer Comments: Technically difficult study due to poor echo windows. IMPRESSIONS  1. LV poorly visualized even with the use of echocontrast. Difficult visualization thorughout study. . Left ventricular ejection fraction, by estimation, is 35 to 40%. The left ventricle has moderately decreased function. Left ventricular endocardial border not optimally defined to evaluate regional wall motion. The left ventricular internal cavity size was mildly dilated. Left ventricular diastolic parameters are indeterminate.  2. RV not well visualized. Grossly appears normal in size and function. . Right ventricular systolic function was not well visualized. The right ventricular size is not well visualized. Tricuspid regurgitation signal is inadequate for assessing PA pressure.  3. Left atrial size was moderately dilated.  4. Right atrial size was moderately dilated.  5. The mitral valve is abnormal. Mild mitral valve regurgitation. No evidence of mitral stenosis.  6. The aortic valve was not well visualized. Aortic valve regurgitation is not visualized. No aortic stenosis is present.  7. The inferior vena cava is normal in size with greater than 50% respiratory variability, suggesting right atrial pressure of 3 mmHg. FINDINGS  Left Ventricle: LV poorly visualized even with the use of echocontrast. Difficult visualization thorughout study. Left ventricular ejection fraction, by  estimation, is 35 to 40%. The left ventricle has moderately decreased function. Left ventricular endocardial border not optimally defined to evaluate regional wall motion. Definity contrast agent was given IV to delineate the left ventricular endocardial borders. The left ventricular internal cavity size was mildly dilated. There is no left ventricular hypertrophy. Left ventricular diastolic parameters are indeterminate. Right Ventricle: RV not well visualized. Grossly appears normal in size and function. The right ventricular size is not well visualized. Right vetricular wall thickness was not well visualized. Right ventricular systolic function was not well visualized.  Tricuspid regurgitation signal is inadequate for assessing PA pressure. Left Atrium: Left atrial size was moderately dilated. Right Atrium: Right atrial size was moderately dilated. Pericardium: There is no evidence of pericardial effusion. Mitral Valve: The mitral valve is abnormal. Mild mitral valve regurgitation. No evidence of mitral valve stenosis. Tricuspid Valve: The tricuspid valve is not well visualized. Tricuspid valve regurgitation is not demonstrated. No evidence of tricuspid stenosis. Aortic Valve: The aortic valve was not well visualized. Aortic valve regurgitation is not visualized. No aortic stenosis is present. Aortic valve mean gradient measures 3.2 mmHg. Aortic valve peak gradient measures 6.8 mmHg. Aortic valve area, by VTI measures 2.27 cm. Pulmonic Valve: The pulmonic valve was not well visualized. Pulmonic valve regurgitation is not visualized. No evidence of pulmonic stenosis. Aorta: The aortic root is normal in size and structure. Venous: The inferior vena cava is normal in size with greater than 50% respiratory variability, suggesting right atrial pressure of 3 mmHg. IAS/Shunts: No atrial level shunt detected by color flow Doppler. Additional Comments: A device lead is visualized in the right atrium and right ventricle.   LEFT VENTRICLE PLAX 2D LVIDd:         5.60 cm   Diastology LVIDs:         4.50 cm   LV e' medial:   6.42 cm/s LV PW:  1.10 cm   LV E/e' medial: 17.3 LV IVS:        1.10 cm LVOT diam:     2.20 cm LV SV:         53 LV SV Index:   23 LVOT Area:     3.80 cm  RIGHT VENTRICLE TAPSE (M-mode): 1.2 cm LEFT ATRIUM              Index        RIGHT ATRIUM           Index LA diam:        3.60 cm  1.53 cm/m   RA Area:     25.50 cm LA Vol (A2C):   83.4 ml  35.31 ml/m  RA Volume:   91.30 ml  38.68 ml/m LA Vol (A4C):   106.0 ml 44.91 ml/m LA Biplane Vol: 109.0 ml 46.18 ml/m  AORTIC VALVE AV Area (Vmax):    2.32 cm AV Area (Vmean):   2.57 cm AV Area (VTI):     2.27 cm AV Vmax:           130.31 cm/s AV Vmean:          82.450 cm/s AV VTI:            0.235 m AV Peak Grad:      6.8 mmHg AV Mean Grad:      3.2 mmHg LVOT Vmax:         79.62 cm/s LVOT Vmean:        55.825 cm/s LVOT VTI:          0.140 m LVOT/AV VTI ratio: 0.60  AORTA Ao Root diam: 3.40 cm MITRAL VALVE MV Area (PHT): 4.31 cm     SHUNTS MV Decel Time: 176 msec     Systemic VTI:  0.14 m MV E velocity: 111.00 cm/s  Systemic Diam: 2.20 cm Dina Rich MD Electronically signed by Dina Rich MD Signature Date/Time: 05/28/2023/3:44:59 PM    Final    DG Chest Port 1 View Result Date: 05/28/2023 CLINICAL DATA:  Shortness of breath and increased dyspnea EXAM: PORTABLE CHEST 1 VIEW COMPARISON:  04/23/2023 FINDINGS: Stable cardiomegaly. Sternotomy and CABG. Right chest wall ICD. Diffuse interstitial coarsening. No focal consolidation, pleural effusion, or pneumothorax. IMPRESSION: Cardiomegaly with mild pulmonary edema. Electronically Signed   By: Minerva Fester M.D.   On: 05/28/2023 03:33    Microbiology: Results for orders placed or performed during the hospital encounter of 04/23/23  Resp panel by RT-PCR (RSV, Flu A&B, Covid) Anterior Nasal Swab     Status: None   Collection Time: 04/23/23  7:01 PM   Specimen: Anterior Nasal Swab  Result Value Ref  Range Status   SARS Coronavirus 2 by RT PCR NEGATIVE NEGATIVE Final    Comment: (NOTE) SARS-CoV-2 target nucleic acids are NOT DETECTED.  The SARS-CoV-2 RNA is generally detectable in upper respiratory specimens during the acute phase of infection. The lowest concentration of SARS-CoV-2 viral copies this assay can detect is 138 copies/mL. A negative result does not preclude SARS-Cov-2 infection and should not be used as the sole basis for treatment or other patient management decisions. A negative result may occur with  improper specimen collection/handling, submission of specimen other than nasopharyngeal swab, presence of viral mutation(s) within the areas targeted by this assay, and inadequate number of viral copies(<138 copies/mL). A negative result must be combined with clinical observations, patient history, and epidemiological information. The expected result is Negative.  Fact Sheet for Patients:  BloggerCourse.com  Fact Sheet for Healthcare Providers:  SeriousBroker.it  This test is no t yet approved or cleared by the [place] FDA and  has been authorized for detection and/or diagnosis of SARS-CoV-2 by FDA under an Emergency Use Authorization (EUA). This EUA will remain  in effect (meaning this test can be used) for the duration of the COVID-19 declaration under Section 564(b)(1) of the Act, 21 U.S.C.section 360bbb-3(b)(1), unless the authorization is terminated  or revoked sooner.       Influenza A by PCR NEGATIVE NEGATIVE Final   Influenza B by PCR NEGATIVE NEGATIVE Final    Comment: (NOTE) The Xpert Xpress SARS-CoV-2/FLU/RSV plus assay is intended as an aid in the diagnosis of influenza from Nasopharyngeal swab specimens and should not be used as a sole basis for treatment. Nasal washings and aspirates are unacceptable for Xpert Xpress SARS-CoV-2/FLU/RSV testing.  Fact Sheet for  Patients: BloggerCourse.com  Fact Sheet for Healthcare Providers: SeriousBroker.it  This test is not yet approved or cleared by the [place] FDA and has been authorized for detection and/or diagnosis of SARS-CoV-2 by FDA under an Emergency Use Authorization (EUA). This EUA will remain in effect (meaning this test can be used) for the duration of the COVID-19 declaration under Section 564(b)(1) of the Act, 21 U.S.C. section 360bbb-3(b)(1), unless the authorization is terminated or revoked.     Resp Syncytial Virus by PCR NEGATIVE NEGATIVE Final    Comment: (NOTE) Fact Sheet for Patients: BloggerCourse.com  Fact Sheet for Healthcare Providers: SeriousBroker.it  This test is not yet approved or cleared by the [place] FDA and has been authorized for detection and/or diagnosis of SARS-CoV-2 by FDA under an Emergency Use Authorization (EUA). This EUA will remain in effect (meaning this test can be used) for the duration of the COVID-19 declaration under Section 564(b)(1) of the Act, 21 U.S.C. section 360bbb-3(b)(1), unless the authorization is terminated or revoked.  Performed at [facility], [address] [place], [place] [address]     Labs: CBC: Recent Labs  Lab 05/28/23 0322 05/31/23 0410  WBC 8.4 8.9  HGB 14.7 15.1  HCT 45.1 47.8  MCV 97.6 98.6  PLT 211 193   Basic Metabolic Panel: Recent Labs  Lab 05/28/23 0322 05/29/23 0413 05/30/23 0405 05/31/23 0410  NA 137 135 136 134*  K 3.5 3.4* 4.2 4.2  CL 103 99 102 99  CO2 23 28 25 25   GLUCOSE 114* 99 97 99  BUN 24* 21 23 23   CREATININE 1.23 0.92 0.99 1.17  CALCIUM 8.5* 8.7* 9.0 9.0  MG 1.3* 2.0 1.8 2.0   Liver Function Tests: Recent Labs  Lab 05/28/23 0322  AST 31  ALT 22  ALKPHOS 51  BILITOT 0.6  PROT 7.1  ALBUMIN 3.5   CBG: No results for input(s): "GLUCAP" in the last 168  hours.  Discharge time spent: greater than 30 minutes.  Signed: Catarina Hartshorn, MD [facility] 05/31/2023

## 2023-05-31 NOTE — Progress Notes (Signed)
 Rounding Note    Patient Name: Douglas [name] Date of Encounter: 05/31/2023  [name] Cardiologist: [facility]  Subjective   SOB has resolved  Inpatient Medications    Scheduled Meds:  apixaban  5 mg Oral BID   citalopram  20 mg Oral Daily   empagliflozin  10 mg Oral Daily   ezetimibe  10 mg Oral Daily   fluticasone  1 spray Each Nare Daily   loratadine  10 mg Oral QHS   metoprolol succinate  12.5 mg Oral Daily   pantoprazole  40 mg Oral BID   rosuvastatin  40 mg Oral QPM   sodium chloride flush  3 mL Intravenous Q12H   Continuous Infusions:  PRN Meds: acetaminophen, ALPRAZolam, oxyCODONE-acetaminophen **AND** oxyCODONE, sodium chloride flush, tiZANidine, traZODone   Vital Signs    Vitals:   05/30/23 1934 05/31/23 0422 05/31/23 0500 05/31/23 0755  BP: (!) 126/91 102/64  (!) 95/56  Pulse: 80 69  79  Resp: 20 16    Temp: 98.1 F (36.7 C) 98.2 F (36.8 C)  (!) 97.5 F (36.4 C)  TempSrc: Oral Oral  Axillary  SpO2: 100% 97%    Weight:   112.2 kg   Height:        Intake/Output Summary (Last 24 hours) at 05/31/2023 0909 Last data filed at 05/31/2023 0500 Gross per 24 hour  Intake 640 ml  Output 2400 ml  Net -1760 ml      05/31/2023    5:00 AM 05/30/2023    5:00 AM 05/29/2023    5:00 AM  Last 3 Weights  Weight (lbs) 247 lb 5.7 oz 249 lb 1.9 oz 268 lb 1.3 oz  Weight (kg) 112.2 kg 113 kg 121.6 kg      Telemetry    Rate controlled afib - Personally Reviewed  ECG    N/a - Personally Reviewed  Physical Exam   GEN: No acute distress.   Neck: No JVD Cardiac: RRR, no murmurs, rubs, or gallops.  Respiratory: Clear to auscultation bilaterally. GI: Soft, nontender, non-distended  MS: No edema; No deformity. Neuro:  Nonfocal  Psych: Normal affect   Labs    High Sensitivity Troponin:   Recent Labs  Lab 05/28/23 0322 05/28/23 0527  TROPONINIHS 17 21*     Chemistry Recent Labs  Lab 05/28/23 0322 05/29/23 0413 05/30/23 0405 05/31/23 0410   NA 137 135 136 134*  K 3.5 3.4* 4.2 4.2  CL 103 99 102 99  CO2 23 28 25 25   GLUCOSE 114* 99 97 99  BUN 24* 21 23 23   CREATININE 1.23 0.92 0.99 1.17  CALCIUM 8.5* 8.7* 9.0 9.0  MG 1.3* 2.0 1.8 2.0  PROT 7.1  --   --   --   ALBUMIN 3.5  --   --   --   AST 31  --   --   --   ALT 22  --   --   --   ALKPHOS 51  --   --   --   BILITOT 0.6  --   --   --   GFRNONAA >60 >60 >60 >60  ANIONGAP 11 8 9 10     Lipids  Recent Labs  Lab 05/29/23 0413  CHOL 99  TRIG 96  HDL 38*  LDLCALC 42  CHOLHDL 2.6    Hematology Recent Labs  Lab 05/28/23 0322 05/31/23 0410  WBC 8.4 8.9  RBC 4.62 4.85  HGB 14.7 15.1  HCT 45.1 47.8  MCV 97.6 98.6  MCH 31.8 31.1  MCHC 32.6 31.6  RDW 16.3* 15.6*  PLT 211 193   Thyroid No results for input(s): "TSH", "FREET4" in the last 168 hours.  BNP Recent Labs  Lab 05/28/23 0322 05/29/23 0413  BNP 557.0* 672.0*    DDimer No results for input(s): "DDIMER" in the last 168 hours.   Radiology    CT PELVIS WO CONTRAST Result Date: 05/31/2023 CLINICAL DATA:  Left groin nodule. EXAM: CT PELVIS WITHOUT CONTRAST TECHNIQUE: Multidetector CT imaging of the pelvis was performed following the standard protocol without intravenous contrast. RADIATION DOSE REDUCTION: This exam was performed according to the departmental dose-optimization program which includes automated exposure control, adjustment of the mA and/or kV according to patient size and/or use of iterative reconstruction technique. COMPARISON:  None Available. FINDINGS: Urinary Tract:  Urinary bladder unremarkable. Bowel: Visualized small bowel loops in colonic segments of the pelvis are nondilated there is some diverticular disease in the sigmoid colon without diverticulitis. Vascular/Lymphatic: Moderate atherosclerotic calcification. No pelvic sidewall lymphadenopathy. No evidence for lymphadenopathy in either groin region although the inferior portion of the groin regions is not been included on the study.  Reproductive: The prostate gland and seminal vesicles are unremarkable. Other:  No free fluid in the pelvis. Musculoskeletal: No worrisome lytic or sclerotic osseous abnormality. IMPRESSION: 1. Exam does not include the lower groin regions bilaterally. While there is no evidence for lymphadenopathy, soft tissue mass, hernia, or infection in the region of the left groin, if there is clinical concern for involvement of the inferior portion of the left groin into the peritoneum or proximal left thigh, repeat imaging will be required. 2. Sigmoid diverticulosis without diverticulitis. Electronically Signed   By: Douglas [name] M.D.   On: 05/31/2023 05:46    Cardiac Studies     Patient Profile     Douglas [name] is a 68 y.o. male with a hx of HFrEF (EF 10-15% in 05/2017, now 35-40%), V. Tach s/p ICD placement 07/2017, CAD s/p PCI (1996) and 4v CABG (2002 with Cath 2019 at [facility] 4/4 patent bypass grafts; 40 - 60% stenosis in body of SVG to OM-2 patent LIMA-LAD, SVG-OM2/OM3 with med mgmt), HTN, HLD, permanent atrial fibrillation on apixaban, asthma, anxiety disorder, chronic pain, opioid dependent, GERD, who is being seen 05/29/2023 for the evaluation of heart failure and presynope at the request of Dr. Arbutus Leas.   Assessment & Plan    1.Acute on chronic HFrEF -- Echo: LVEF 35-40%  -family phone note reports when they went to his house several medicine bottles that have not been used - BNP 557, CXR mild edema.    -diuresis limited by low bp's. Received IV lasix 20mg  x 1 yesterday. I/Os incomplete, reported 2.4 L uop. Mild variation in Cr without clear trend.  - added back toprol 12.5mg  daily. Continue to hold other home HF meds due to soft bp's, has remianed on jardiance 10mg  daily.  - would resume his torsemide 20mg  daily tomorrow     2.CAD - prior CABG, interventions as listed above. - no evidence of acute issues this admission   3.Permanent afib - adding back toprol at lower doser of 12.5mg  daily due  to low bp's, follow rates.  - continue eliquis   4, Near syncope - benign device check on admission - likely due to low bp's  No additional cardiology recs at this time, we will sign off inpatient care. He needs to f/u with his regular cardiology team with [facility]  For questions or updates, please contact [facility] Please consult [email or web address] for contact info under        Signed, Dina Rich, MD  05/31/2023, 9:09 AM

## 2023-05-31 NOTE — Progress Notes (Signed)
 Patient requested his sister be removed from his chart so that she cannot have information anymore. I confirmed with another nurse.

## 2023-05-31 NOTE — TOC Transition Note (Signed)
 Transition of Care [facility]) - Discharge Note   Patient Details  Name: Douglas [name] MRN: [id number] Date of Birth: Jul 23, 1955  [facility] of [facility] CM/SW Contact:  Beather Arbour Phone Number: 05/31/2023, 11:37 AM   Clinical Narrative:    Patient was denied by his insurance for SNF. CSW spoke with pt sister with patient permission and explained why patient was denied. CSW shared with sister that [facility] would be able to provide services of HHPT, HHRN, and Nursing aide . Jennifer with [facility] able to accept. Sister agreeable to [facility] since that is pt only option. Sister did raise interest in ALF. CSW then connected sister with Cammy Copa at [facility] for further community assistance so patient could possibly get into an ALF. TOC signing off.   Final next level of care: Home w Home Health Services Barriers to Discharge: Barriers Resolved   Patient Goals and CMS Choice Patient states their goals for this hospitalization and ongoing recovery are:: return back home CMS Medicare.gov Compare Post Acute Care list provided to:: Patient Choice offered to / list presented to : Sibling      Discharge Placement  Home with Home health         Name of family member notified: Okey Regal - sister Patient and family notified of of transfer: 05/31/23  Discharge Plan and Services Additional resources added to the After Visit Summary for                  DME Arranged: N/A DME Agency: NA       HH Arranged: RN, PT, Nurse's Aide HH Agency: [facility] Date [facility] Agency Contacted: 05/31/23 Time HH Agency Contacted: 1136 Representative spoke with at [facility] Agency: Victorino Dike  Social Drivers of Health (SDOH) Interventions SDOH Screenings   Food Insecurity: Food Insecurity Present (05/28/2023)  Housing: Low Risk  (05/28/2023)  Transportation Needs: No Transportation Needs (05/30/2023)  Utilities: Not At Risk (05/28/2023)  Social Connections: Moderately Isolated (05/28/2023)   Tobacco Use: Low Risk  (05/30/2023)     Readmission Risk Interventions    05/31/2023   11:35 AM 05/30/2023   10:20 AM  Readmission Risk Prevention Plan  Transportation Screening Complete Complete  Home Care Screening Complete Complete  Medication Review (RN CM) Complete Complete

## 2023-05-31 NOTE — Progress Notes (Signed)
 Pt discharged via WC to [facility] with belongings in his possession.

## 2023-05-31 NOTE — Care Management Important Message (Signed)
 Important Message  Patient Details  Name: Douglas [name] MRN: [id number] Date of Birth: 05-12-1955   Important Message Given:  Yes - Medicare IM     Corey Harold 05/31/2023, 11:39 AM

## 2023-05-31 NOTE — Progress Notes (Signed)
 Mobility Specialist Progress Note:    05/31/23 1255  Mobility  Activity Ambulated with assistance in hallway  Level of Assistance Standby assist, set-up cues, supervision of patient - no hands on  Assistive Device None  Distance Ambulated (ft) 100 ft  Range of Motion/Exercises Active;All extremities  Activity Response Tolerated well  Mobility Referral Yes  Mobility visit 1 Mobility  Mobility Specialist Start Time (ACUTE ONLY) 1255  Mobility Specialist Stop Time (ACUTE ONLY) 1330  Mobility Specialist Time Calculation (min) (ACUTE ONLY) 35 min   Pt received standing EOB, agreeable to mobility. Required SBA to stand and ambulate with no AD. Tolerated well, asx throughout. Left pt sitting in chair, all needs met.  Robben Jagiello Mobility Specialist Please contact via Special educational needs teacher or  Rehab office at [phone]

## 2023-06-04 MED ORDER — PERFLUTREN LIPID MICROSPHERE
1.0000 mL | INTRAVENOUS | Status: AC | PRN
  Administered 2023-05-28: 2 mL via INTRAVENOUS

## 2023-06-05 ENCOUNTER — Telehealth (HOSPITAL_COMMUNITY): Payer: Self-pay

## 2023-06-05 NOTE — Telephone Encounter (Signed)
 Called to confirm/remind patient of their appointment [facility] on 06/06/2023 2:00.   Appointment:   [] Confirmed  [x] Left mess   [] No answer/No voice mail  [] Phone not in service  Patient reminded to bring all medications and/or complete list.  Confirmed patient has transportation. Gave directions, instructed to utilize valet parking.

## 2023-06-06 ENCOUNTER — Inpatient Hospital Stay (HOSPITAL_COMMUNITY): Admit: 2023-06-06

## 2023-06-06 ENCOUNTER — Telehealth (HOSPITAL_COMMUNITY): Payer: Self-pay | Admitting: Cardiology
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
## 2023-08-28 ENCOUNTER — Telehealth: Payer: Self-pay | Admitting: Family Medicine

## 2023-08-28 NOTE — Telephone Encounter (Signed)
 Copied from CRM [id number]. Topic: Clinical - Medication Refill >> Aug 28, 2023  3:45 PM Marissa P wrote: Medication: ALPRAZolam  (XANAX ) 1 MG tablet  Has the patient contacted their pharmacy? Yes (Agent: If no, request that the patient contact the pharmacy for the refill. If patient does not wish to contact the pharmacy document the reason why and proceed with request.) (Agent: If yes, when and what did the pharmacy advise?)  This is the patient's preferred pharmacy:   [facility] - [place], [place] - [address] [place] [address] Phone: [phone] Fax: [phone]  Is this the correct pharmacy for this prescription? Yes If no, delete pharmacy and type the correct one.   Has the prescription been filled recently? Yes  Is the patient out of the medication? Yes  Has the patient been seen for an appointment in the last year OR does the patient have an upcoming appointment? No  Can we respond through MyChart? Unsure  Agent: Please be advised that Rx refills may take up to 3 business days. We ask that you follow-up with your pharmacy.

## 2023-08-28 NOTE — Telephone Encounter (Signed)
 Called to inform patient we are not able to fill any medications until after his new patient appointment: spoke with patient and attempted to explain this to him however patient not receptive to talking about this subject: nurse attempted to redirect but unsuccessful.

## 2023-09-02 ENCOUNTER — Other Ambulatory Visit: Payer: Self-pay | Admitting: Physician Assistant

## 2023-09-02 NOTE — Telephone Encounter (Unsigned)
 Copied from CRM [id number]. Topic: Clinical - Medication Refill >> Sep 02, 2023  9:32 AM Carlatta H wrote: Medication: fluticasone  (FLONASE ) 50 MCG/ACT nasal spray [[id number]]   Has the patient contacted their pharmacy? No (Agent: If no, request that the patient contact the pharmacy for the refill. If patient does not wish to contact the pharmacy document the reason why and proceed with request.) (Agent: If yes, when and what did the pharmacy advise?)  This is the patient's preferred pharmacy:  [facility] - [place], [place] - [address] [place] [address] Phone: [phone] Fax: [phone]  Is this the correct pharmacy for this prescription? Yes If no, delete pharmacy and type the correct one.   Has the prescription been filled recently? No  Is the patient out of the medication? Yes  Has the patient been seen for an appointment in the last year OR does the patient have an upcoming appointment? Yes  Can we respond through MyChart? No  Agent: Please be advised that Rx refills may take up to 3 business days. We ask that you follow-up with your pharmacy.

## 2023-09-04 ENCOUNTER — Other Ambulatory Visit: Payer: Self-pay | Admitting: Physician Assistant

## 2023-10-21 ENCOUNTER — Ambulatory Visit: Attending: Internal Medicine | Admitting: Internal Medicine

## 2023-10-21 ENCOUNTER — Encounter: Payer: Self-pay | Admitting: Internal Medicine

## 2023-10-21 VITALS — BP 108/75 | HR 91 | Ht 69.0 in | Wt 296.0 lb

## 2023-10-21 DIAGNOSIS — I5022 Chronic systolic (congestive) heart failure: Secondary | ICD-10-CM

## 2023-10-21 DIAGNOSIS — I255 Ischemic cardiomyopathy: Secondary | ICD-10-CM | POA: Diagnosis not present

## 2023-10-21 LAB — CUP PACEART INCLINIC DEVICE CHECK
Battery Remaining Longevity: 66 mo
Battery Voltage: 2.99 V
Brady Statistic AP VP Percent: 0 %
Brady Statistic AP VS Percent: 0 %
Brady Statistic AS VP Percent: 2.31 %
Brady Statistic AS VS Percent: 97.69 %
Brady Statistic RA Percent Paced: 0 %
Brady Statistic RV Percent Paced: 3.79 %
Date Time Interrogation Session: 20250825123255
HighPow Impedance: 62 Ohm
Implantable Lead Connection Status: 753985
Implantable Lead Connection Status: 753985
Implantable Lead Implant Date: 20060116
Implantable Lead Implant Date: 20130715
Implantable Lead Location: 753859
Implantable Lead Location: 753860
Implantable Lead Model: 5076
Implantable Lead Model: 6935
Implantable Pulse Generator Implant Date: 20200804
Lead Channel Impedance Value: 1748 Ohm
Lead Channel Impedance Value: 1748 Ohm
Lead Channel Impedance Value: 323 Ohm
Lead Channel Impedance Value: 437 Ohm
Lead Channel Pacing Threshold Amplitude: 0.875 V
Lead Channel Pacing Threshold Amplitude: 1 V
Lead Channel Pacing Threshold Pulse Width: 0.4 ms
Lead Channel Pacing Threshold Pulse Width: 0.4 ms
Lead Channel Sensing Intrinsic Amplitude: 15.25 mV
Lead Channel Sensing Intrinsic Amplitude: 24.375 mV
Lead Channel Sensing Intrinsic Amplitude: 4.125 mV
Lead Channel Sensing Intrinsic Amplitude: 4.125 mV
Lead Channel Setting Pacing Amplitude: 2 V
Lead Channel Setting Pacing Pulse Width: 0.4 ms
Lead Channel Setting Sensing Sensitivity: 0.3 mV
Zone Setting Status: 755011

## 2023-10-21 NOTE — Patient Instructions (Signed)
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
## 2023-10-25 ENCOUNTER — Encounter: Admitting: Internal Medicine

## 2023-11-13 ENCOUNTER — Ambulatory Visit: Payer: Self-pay

## 2023-11-13 NOTE — Telephone Encounter (Signed)
 FYI Only or Action Required?: FYI only for provider.  Patient was last seen in primary care on unknown.  Called Nurse Triage reporting Toe Pain.  Symptoms began today.  Interventions attempted: Rest, hydration, or home remedies and Other: Epsom salt soaks.  Symptoms are: gradually worsening.  Triage Disposition: See Physician Within 24 Hours  Patient/caregiver understands and will follow disposition?: Yes       Copied from CRM #[id number]. Topic: Clinical - Red Word Triage >> Nov 13, 2023  4:02 PM Charlet HERO wrote: Red Word that prompted transfer to Nurse Triage: Patient is calling he was in accident on his way to appt this morning and he has severe pain in his toes wants to reschedule his appt with [facility]. Reason for Disposition  [1] Swollen toe AND [2] no fever  (Exceptions: Just a localized bump from bunion, corns, insect bite, sting.)  Answer Assessment - Initial Assessment Questions Patient has feet elevated and using epsom salt for symptoms. Patient states he is ok to walk around due to symptoms and it feels like an aching and throbbing feeling.   1. ONSET: When did the pain start?      Today  2. LOCATION: Where is the pain located?   (e.g., around nail, entire toe, at foot joint)      2 or 3 toes on R foot  3. PAIN: How bad is the pain?    (Scale 1-10; or mild, moderate, severe)     5/10 4. APPEARANCE: What does the toe look like? (e.g., redness, swelling, bruising, pallor)     Swelling  5. CAUSE: What do you think is causing the toe pain?     He states he bent back his toe back.  6. OTHER SYMPTOMS: Do you have any other symptoms? (e.g., leg pain, rash, fever, numbness) Denies  Protocols used: Toe Pain-A-AH

## 2023-11-14 ENCOUNTER — Ambulatory Visit (INDEPENDENT_AMBULATORY_CARE_PROVIDER_SITE_OTHER): Admitting: Physician Assistant

## 2023-11-14 ENCOUNTER — Encounter: Payer: Self-pay | Admitting: Physician Assistant

## 2023-11-14 VITALS — BP 120/79 | HR 107 | Temp 97.2°F | Ht 69.0 in | Wt 287.0 lb

## 2023-11-14 DIAGNOSIS — Z23 Encounter for immunization: Secondary | ICD-10-CM | POA: Diagnosis not present

## 2023-11-14 DIAGNOSIS — R296 Repeated falls: Secondary | ICD-10-CM

## 2023-11-14 DIAGNOSIS — I10 Essential (primary) hypertension: Secondary | ICD-10-CM

## 2023-11-14 DIAGNOSIS — N1831 Chronic kidney disease, stage 3a: Secondary | ICD-10-CM | POA: Diagnosis not present

## 2023-11-14 DIAGNOSIS — E785 Hyperlipidemia, unspecified: Secondary | ICD-10-CM

## 2023-11-14 DIAGNOSIS — G8929 Other chronic pain: Secondary | ICD-10-CM

## 2023-11-14 DIAGNOSIS — Z7689 Persons encountering health services in other specified circumstances: Secondary | ICD-10-CM

## 2023-11-14 DIAGNOSIS — M545 Low back pain, unspecified: Secondary | ICD-10-CM

## 2023-11-14 NOTE — Progress Notes (Signed)
 New Patient Office Visit  Subjective    Patient ID: Douglas [name], male    DOB: 1955-12-03  Age: 68 y.o. MRN: [id number]  CC:  Chief Complaint  Patient presents with   Establish Care   Medical Management of Chronic Issues    HPI Douglas [name] presents to establish care  Discussed the use of AI scribe software for clinical note transcription with the patient, who gave verbal consent to proceed.  History of Present Illness Douglas [name] is a 68 year old male with a history of significant cardiac issues who presents with foot pain and back pain. He is accompanied by his Child psychotherapist.  Patient with extensive past medical history including, Afib, ischemic cardiomyopathy, systolic heart failure, CAD, hypertension, asthma, GERD, chronic back pain, [name] 3 CKD, hyperlipidemia, anxiety, depression, and blindness in his right eye. He follows with cardiology at [facility].   He has experienced multiple falls over the past month, with the most recent fall occurring on Friday. He attributes some of these falls to a high dose of metoprolol , which was reduced from 200 mg to 12.5 mg in the hospital in April.  Approximately a month ago, a TV fell on both of his feet, during a fall, resulting in persistent foot pain. He requests an x-ray to determine if there is any underlying injury, as the pain has not subsided. He also twisted his ankle and sustained gashes on his arm during a recent fall.  He experiences chronic back pain, which he attributes to previous injuries, including breaking six bones in his back. He was unable to undergo surgery due to cardiac concerns. He spent a year in rehabilitation learning to walk again after these injuries. He takes Percocet for pain management, approximately three times a day as needed, and Xanax  twice daily. He is concerned about changing his pain medication regimen, as previous adjustments have led to increased back pain.    Outpatient Encounter Medications  as of 11/14/2023  Medication Sig   albuterol  (VENTOLIN  HFA) 108 (90 Base) MCG/ACT inhaler Inhale 2 puffs into the lungs.   ALPRAZolam  (XANAX ) 1 MG tablet Take 1-2 mg by mouth 2 (two) times daily as needed for anxiety.   citalopram  (CELEXA ) 20 MG tablet Take 20 mg by mouth daily.   ELIQUIS  5 MG TABS tablet TAKE ONE TABLET BY MOUTH TWICE DAILY   ezetimibe  (ZETIA ) 10 MG tablet Take 10 mg by mouth daily.   fluticasone  (FLONASE ) 50 MCG/ACT nasal spray Place 1 spray into both nostrils daily.   fluticasone -salmeterol (ADVAIR) 250-50 MCG/ACT AEPB Inhale 1 puff into the lungs in the morning and at bedtime.   JARDIANCE  10 MG TABS tablet Take 10 mg by mouth daily.   lansoprazole (PREVACID) 30 MG capsule Take 30 mg by mouth daily.     metoprolol  succinate (TOPROL -XL) 25 MG 24 hr tablet Take 0.5 tablets (12.5 mg total) by mouth daily.   Omega-3 Fatty Acids (FISH OIL) 1200 MG CAPS Take 1,200 mg by mouth daily.   oxyCODONE -acetaminophen  (PERCOCET) 10-325 MG per tablet Take 1 tablet by mouth every 6 (six) hours as needed for pain.    rosuvastatin  (CRESTOR ) 40 MG tablet TAKE ONE (1) TABLET EACH DAY   spironolactone  (ALDACTONE ) 25 MG tablet Take 25 mg by mouth daily.   tiZANidine  (ZANAFLEX ) 4 MG tablet Take 4 mg by mouth every 8 (eight) hours as needed for muscle spasms.   torsemide  (DEMADEX ) 20 MG tablet Take 1 tablet (20 mg total) by mouth  daily.   Carboxymethylcellulose Sodium (ARTIFICIAL TEARS OP) Place 1 drop into the right eye 3 (three) times daily.   [DISCONTINUED] cephALEXin  (KEFLEX ) 500 MG capsule Take 1 capsule (500 mg total) by mouth every 8 (eight) hours. (Patient not taking: Reported on 11/14/2023)   [DISCONTINUED] doxycycline  (VIBRA -TABS) 100 MG tablet Take 1 tablet (100 mg total) by mouth every 12 (twelve) hours. (Patient not taking: Reported on 10/21/2023)   [DISCONTINUED] loratadine  (CLARITIN ) 10 MG tablet Take 10 mg by mouth daily. (Patient not taking: Reported on 11/14/2023)   [DISCONTINUED]  Multiple Vitamin (MULTIVITAMIN WITH MINERALS) TABS Take 1 tablet by mouth daily. And balance of nature (Patient not taking: Reported on 11/14/2023)   [DISCONTINUED] sacubitril -valsartan  (ENTRESTO ) 97-103 MG Take 1 tablet by mouth 2 (two) times daily. (Patient not taking: Reported on 11/14/2023)   No facility-administered encounter medications on file as of 11/14/2023.    Past Medical History:  Diagnosis Date   A-fib [facility])    AICD (automatic cardioverter/defibrillator) present    Anemia    Asthma    CAD (coronary artery disease)    CHF (congestive heart failure) (HCC)    Diabetes mellitus without complication (HCC)    Dyslipidemia    Elevated cholesterol    Hypertension     Past Surgical History:  Procedure Laterality Date   CARDIAC CATHETERIZATION  1996   stent placement   CARDIAC DEFIBRILLATOR PLACEMENT     CORONARY ARTERY BYPASS GRAFT  2002   times 4   CORONARY ARTERY BYPASS GRAFT     EYE SURGERY     ICD GENERATOR CHANGEOUT N/A 09/30/2018   Procedure: ICD GENERATOR CHANGEOUT;  Surgeon: Waddell Danelle ORN, MD;  Location: [facility] INVASIVE CV LAB;  Service: Cardiovascular;  Laterality: N/A;   IMPLANTABLE CARDIOVERTER DEFIBRILLATOR (ICD) GENERATOR CHANGE N/A 09/10/2011   Procedure: ICD GENERATOR CHANGE;  Surgeon: Danelle ORN Waddell, MD;  Location: [facility] CATH LAB;  Service: Cardiovascular;  Laterality: N/A;   INSERT / REPLACE / REMOVE PACEMAKER     LEFT HEART CATHETERIZATION WITH CORONARY ANGIOGRAM N/A 08/29/2011   Procedure: LEFT HEART CATHETERIZATION WITH CORONARY ANGIOGRAM;  Surgeon: Peter M Swaziland, MD;  Location: [facility] CATH LAB;  Service: Cardiovascular;  Laterality: N/A;    Family History  Problem Relation Age of Onset   Heart attack Father     Social History   Socioeconomic History   Marital status: Divorced    Spouse name: Not on file   Number of children: Not on file   Years of education: Not on file   Highest education level: 10th grade  Occupational History   Occupation: Retired   Tobacco Use   Smoking status: Never   Smokeless tobacco: Never  Vaping Use   Vaping status: Never Used  Substance and Sexual Activity   Alcohol  use: Never   Drug use: Never    Comment: marijuana   Sexual activity: Not on file  Other Topics Concern   Not on file  Social History Narrative   ** Merged History Encounter **       Social Drivers of Health   Financial Resource Strain: Not on file  Food Insecurity: Low Risk  (06/05/2023)   Received from [facility]   Hunger Vital Sign    Within the past 12 months, you worried that your food would run out before you got money to buy more: Never true    Within the past 12 months, the food you bought just didn't last and you didn't have money to get  more. : Never true  Recent Concern: Food Insecurity - Food Insecurity Present (05/28/2023)   Hunger Vital Sign    Worried About Running Out of Food in the Last Year: Sometimes true    Ran Out of Food in the Last Year: Sometimes true  Transportation Needs: No Transportation Needs (06/05/2023)   Received from [facility]    In the past 12 months, has lack of reliable transportation kept you from medical appointments, meetings, work or from getting things needed for daily living? : No  Physical Activity: Not on file  Stress: Not on file  Social Connections: Moderately Isolated (05/28/2023)   Social Connection and Isolation Panel    Frequency of Communication with Friends and Family: Three times a week    Frequency of Social Gatherings with Friends and Family: Once a week    Attends Religious Services: 1 to 4 times per year    Active Member of [employer] or Organizations: No    Attends Banker Meetings: Never    Marital Status: Divorced  Catering manager Violence: Not At Risk (05/28/2023)   Humiliation, Afraid, Rape, and Kick questionnaire    Fear of Current or Ex-Partner: No    Emotionally Abused: No    Physically Abused: No    Sexually Abused: No    Review of  Systems  Constitutional:  Negative for chills, fever and malaise/fatigue.  Eyes:  Negative for blurred vision and double vision.  Respiratory:  Negative for cough and shortness of breath.   Cardiovascular:  Negative for chest pain and palpitations.  Musculoskeletal:  Positive for back pain, falls and joint pain. Negative for myalgias.  Neurological:  Negative for dizziness and headaches.        Objective    BP 120/79   Pulse (!) 107   Temp (!) 97.2 F (36.2 C)   Ht 5' 9 (1.753 m)   Wt 287 lb (130.2 kg)   SpO2 93%   BMI 42.38 kg/m   Physical Exam Constitutional:      Appearance: Normal appearance. He is obese.     Comments: Chronically ill appearing male, appearing older than stated age  HENT:     Head: Normocephalic and atraumatic.     Mouth/Throat:     Mouth: Mucous membranes are moist.     Pharynx: Oropharynx is clear.  Eyes:     Extraocular Movements: Extraocular movements intact.     Conjunctiva/sclera: Conjunctivae normal.  Cardiovascular:     Rate and Rhythm: Normal rate and regular rhythm.     Heart sounds: Normal heart sounds. No murmur heard. Pulmonary:     Effort: Pulmonary effort is normal.     Breath sounds: Normal breath sounds. No wheezing or rales.  Musculoskeletal:     Right foot: Swelling present.     Left foot: Swelling present.  Skin:    General: Skin is warm and dry.  Neurological:     General: No focal deficit present.     Mental Status: He is alert and oriented to person, place, and time.  Psychiatric:        Mood and Affect: Mood normal.        Behavior: Behavior normal.        Assessment & Plan:  Encounter to establish care  Frequent falls Assessment & Plan: Multiple falls with injuries possibly due to medication changes. Concerns about sedative effects of Xanax  and Percocet increasing fall risk. Persistent foot pain from previous injury. Chronic back  pain from past fractures. - XR of both feet to rule out bony injury. - Refer  to pain management for long-term pain medication management. - Discussed reducing Xanax  to once daily at bedtime to minimize fall risk. - Discussed risks of sedative medications and encourage minimal use of Xanax  and Percocet. - Schedule follow-up in 3 months.  Orders: -     DG Foot Complete Left -     DG Foot Complete Right  Essential hypertension Assessment & Plan: 120/79 Controlled. Continue current medications. No change in management. Discussed DASH diet and dietary sodium restrictions.  Lab work today.    Orders: -     CBC with Differential/Platelet  Hyperlipidemia, unspecified hyperlipidemia type -     Lipid panel  [name] 3a chronic kidney disease (HCC) -     CMP14+EGFR  Chronic bilateral low back pain, unspecified whether sciatica present  Immunization due -     Flu vaccine HIGH DOSE PF(Fluzone Trivalent)   Return in about 3 months (around 02/13/2024) for chronic fu .   Charmaine Plummer Matich, PA-C

## 2023-11-14 NOTE — Assessment & Plan Note (Signed)
 120/79 Controlled. Continue current medications. No change in management. Discussed DASH diet and dietary sodium restrictions.  Lab work today.

## 2023-11-14 NOTE — Assessment & Plan Note (Signed)
 Multiple falls with injuries possibly due to medication changes. Concerns about sedative effects of Xanax  and Percocet increasing fall risk. Persistent foot pain from previous injury. Chronic back pain from past fractures. - XR of both feet to rule out bony injury. - Refer to pain management for long-term pain medication management. - Discussed reducing Xanax  to once daily at bedtime to minimize fall risk. - Discussed risks of sedative medications and encourage minimal use of Xanax  and Percocet. - Schedule follow-up in 3 months.

## 2023-11-21 ENCOUNTER — Ambulatory Visit: Payer: Self-pay | Admitting: Physician Assistant

## 2023-11-21 ENCOUNTER — Other Ambulatory Visit: Payer: Self-pay

## 2023-11-21 ENCOUNTER — Other Ambulatory Visit: Payer: Self-pay | Admitting: Physician Assistant

## 2023-11-21 ENCOUNTER — Ambulatory Visit (HOSPITAL_COMMUNITY)
Admission: RE | Admit: 2023-11-21 | Discharge: 2023-11-21 | Disposition: A | Discharge status: Home / Self Care | Source: Ambulatory Visit | Attending: Physician Assistant | Admitting: Physician Assistant

## 2023-11-21 ENCOUNTER — Ambulatory Visit: Payer: Self-pay

## 2023-11-21 ENCOUNTER — Telehealth: Payer: Self-pay | Admitting: Physician Assistant

## 2023-11-21 DIAGNOSIS — R296 Repeated falls: Secondary | ICD-10-CM | POA: Insufficient documentation

## 2023-11-21 DIAGNOSIS — S92154A Nondisplaced avulsion fracture (chip fracture) of right talus, initial encounter for closed fracture: Secondary | ICD-10-CM

## 2023-11-21 NOTE — Telephone Encounter (Signed)
 Copied from CRM [id number]. Topic: Clinical - Medication Question >> Nov 21, 2023  3:27 PM Geneva B wrote: Reason for CRM: patient is calling in saying that he is out of his  albuterol  (VENTOLIN  HFA) 108 (90 Base and he wants to know about getting a 3 month supply of his rx please call [phone] (M

## 2023-11-21 NOTE — Progress Notes (Signed)
 Left detailed voicemail per DPR on Douglas [name] (Child psychotherapist) cell phone #

## 2023-11-21 NOTE — Telephone Encounter (Signed)
 Sent to provider for review

## 2023-11-21 NOTE — Telephone Encounter (Addendum)
 Pt requesting albuterol  refill. States that he is not in distress at this time.   Copied from CRM [id number]. Topic: Clinical - Red Word Triage >> Nov 21, 2023  3:36 PM Tonda B wrote: [facility] that prompted transfer to Nurse Triage: patient is calling in saying that he is having difficulty breathing

## 2023-11-21 NOTE — Telephone Encounter (Signed)
 Refill on    albuterol  (VENTOLIN  HFA) 108 (90 Base) MCG/ACT inhaler  [facility]

## 2023-11-22 LAB — CMP14+EGFR
ALT: 13 IU/L (ref 0–44)
AST: 24 IU/L (ref 0–40)
Albumin: 4 g/dL (ref 3.9–4.9)
Alkaline Phosphatase: 126 IU/L — ABNORMAL HIGH (ref 47–123)
BUN/Creatinine Ratio: 9 — ABNORMAL LOW (ref 10–24)
BUN: 17 mg/dL (ref 8–27)
Bilirubin Total: 1.4 mg/dL — ABNORMAL HIGH (ref 0.0–1.2)
CO2: 23 mmol/L (ref 20–29)
Calcium: 9.5 mg/dL (ref 8.6–10.2)
Chloride: 97 mmol/L (ref 96–106)
Creatinine, Ser: 1.91 mg/dL — ABNORMAL HIGH (ref 0.76–1.27)
Globulin, Total: 3.8 g/dL (ref 1.5–4.5)
Glucose: 126 mg/dL — ABNORMAL HIGH (ref 70–99)
Potassium: 4 mmol/L (ref 3.5–5.2)
Sodium: 138 mmol/L (ref 134–144)
Total Protein: 7.8 g/dL (ref 6.0–8.5)
eGFR: 38 mL/min/1.73 — ABNORMAL LOW (ref >59)

## 2023-11-22 LAB — CBC WITH DIFFERENTIAL/PLATELET
Basophils Absolute: 0.1 x10E3/uL (ref 0.0–0.2)
Basos: 1 %
EOS (ABSOLUTE): 0.2 x10E3/uL (ref 0.0–0.4)
Eos: 3 %
Hematocrit: 47 % (ref 37.5–51.0)
Hemoglobin: 13.4 g/dL (ref 13.0–17.7)
Immature Grans (Abs): 0 x10E3/uL (ref 0.0–0.1)
Immature Granulocytes: 0 %
Lymphocytes Absolute: 1.6 x10E3/uL (ref 0.7–3.1)
Lymphs: 24 %
MCH: 24.7 pg — ABNORMAL LOW (ref 26.6–33.0)
MCHC: 28.5 g/dL — ABNORMAL LOW (ref 31.5–35.7)
MCV: 87 fL (ref 79–97)
Monocytes Absolute: 0.8 x10E3/uL (ref 0.1–0.9)
Monocytes: 12 %
Neutrophils Absolute: 4.1 x10E3/uL (ref 1.4–7.0)
Neutrophils: 60 %
Platelets: 247 x10E3/uL (ref 150–450)
RBC: 5.42 x10E6/uL (ref 4.14–5.80)
RDW: 17.2 % — ABNORMAL HIGH (ref 11.6–15.4)
WBC: 6.7 x10E3/uL (ref 3.4–10.8)

## 2023-11-22 LAB — LIPID PANEL
Chol/HDL Ratio: 2.8 ratio (ref 0.0–5.0)
Cholesterol, Total: 55 mg/dL — ABNORMAL LOW (ref 100–199)
HDL: 20 mg/dL — ABNORMAL LOW (ref >39)
LDL Chol Calc (NIH): 17 mg/dL (ref 0–99)
Triglycerides: 87 mg/dL (ref 0–149)
VLDL Cholesterol Cal: 18 mg/dL (ref 5–40)

## 2023-11-22 MED ORDER — ALBUTEROL SULFATE HFA 108 (90 BASE) MCG/ACT IN AERS: 2.0000 | INHALATION_SPRAY | Freq: Four times a day (QID) | RESPIRATORY_TRACT | 1 refills | Status: AC | PRN

## 2023-11-25 ENCOUNTER — Other Ambulatory Visit: Payer: Self-pay | Admitting: Physician Assistant

## 2023-11-25 DIAGNOSIS — R7989 Other specified abnormal findings of blood chemistry: Secondary | ICD-10-CM

## 2023-11-26 ENCOUNTER — Other Ambulatory Visit: Payer: Self-pay | Admitting: Physician Assistant

## 2023-11-26 DIAGNOSIS — F419 Anxiety disorder, unspecified: Secondary | ICD-10-CM

## 2023-11-26 DIAGNOSIS — G8929 Other chronic pain: Secondary | ICD-10-CM

## 2023-12-02 ENCOUNTER — Other Ambulatory Visit: Payer: Self-pay | Admitting: Physician Assistant

## 2023-12-02 ENCOUNTER — Telehealth: Payer: Self-pay | Admitting: Physician Assistant

## 2023-12-02 ENCOUNTER — Ambulatory Visit: Admitting: Internal Medicine

## 2023-12-02 ENCOUNTER — Telehealth: Payer: Self-pay

## 2023-12-02 MED ORDER — METOPROLOL SUCCINATE ER 25 MG PO TB24: 12.5000 mg | ORAL_TABLET | Freq: Every day | ORAL | 1 refills | Status: DC

## 2023-12-02 MED ORDER — OXYCODONE-ACETAMINOPHEN 10-325 MG PO TABS: 1.0000 | ORAL_TABLET | Freq: Four times a day (QID) | ORAL | 0 refills | Status: DC | PRN

## 2023-12-02 NOTE — Telephone Encounter (Unsigned)
 Copied from CRM [id number]. Topic: Clinical - Medication Refill >> Dec 02, 2023 10:15 AM Lauren C wrote: Medication:  metoprolol  succinate (TOPROL -XL) 25 MG 24 hr tablet He states the pharmacy gave him 50mg  and he took them and has been doing well. Would like to take 50 mg from now on. He says he needs the meds stat because he will have seizure if he misses them.  Has the patient contacted their pharmacy? No  This is the patient's preferred pharmacy:  [facility] - [place], [place] - [address] [place] [address] Phone: [phone] Fax: [phone]  Is this the correct pharmacy for this prescription? Yes If no, delete pharmacy and type the correct one.   Has the prescription been filled recently? Yes  Is the patient out of the medication? No, 3 pills  Has the patient been seen for an appointment in the last year OR does the patient have an upcoming appointment? Yes  Can we respond through MyChart? No  Agent: Please be advised that Rx refills may take up to 3 business days. We ask that you follow-up with your pharmacy.

## 2023-12-02 NOTE — Telephone Encounter (Signed)
 Copied from CRM #[id number]. Topic: Clinical - Medication Question >> Dec 02, 2023 10:32 AM Tinnie BROCKS wrote: Reason for CRM: The provider pt was established with retired that prescribed him with oxyCODONE -acetaminophen  (PERCOCET) 10-325 MG per tablet tiZANidine  (ZANAFLEX ) 4 MG tablet He states Zanaflex  doesn't really help him now though so if she has any ideas of a better muscle relaxer that would be better. So he is requesting a refill to [facility] - [place], [place] - [address] [place] [address] Phone: [phone] Fax: [phone]  Information provided for pain management referral for continued care.

## 2023-12-02 NOTE — Telephone Encounter (Unsigned)
 Copied from CRM #[id number]. Topic: Clinical - Refused Triage >> Dec 02, 2023 10:42 AM Tinnie C wrote: Patient/caller voiced complaints of Recent fall, hit head, pain and bruising all over. Declined transfer to triage. He was checked out at the hospital, they stated he was okay. Does not desire appt, just wants meds filled. Previous dr for pain mgmt retired and he has had a hard time getting in touch with social services to get his pain management referral scheduled. He takes oxyCODONE -acetaminophen  (PERCOCET) 10-325 MG per tablet and is about to run out. He is also concerned about almost being out of metoprolol , stating he will have a seizure if he misses one.

## 2023-12-04 NOTE — Telephone Encounter (Signed)
Telephone call-voicemail not set up ?

## 2023-12-12 ENCOUNTER — Other Ambulatory Visit: Payer: Self-pay

## 2023-12-12 ENCOUNTER — Inpatient Hospital Stay (HOSPITAL_COMMUNITY)
Admission: EM | Admit: 2023-12-12 | Discharge: 2023-12-28 | DRG: 286 | Disposition: A | Discharge status: Home Health | Attending: Family Medicine | Admitting: Family Medicine

## 2023-12-12 ENCOUNTER — Ambulatory Visit: Payer: Self-pay

## 2023-12-12 ENCOUNTER — Emergency Department (HOSPITAL_COMMUNITY)

## 2023-12-12 ENCOUNTER — Encounter (HOSPITAL_COMMUNITY): Payer: Self-pay

## 2023-12-12 DIAGNOSIS — I5023 Acute on chronic systolic (congestive) heart failure: Secondary | ICD-10-CM | POA: Diagnosis present

## 2023-12-12 DIAGNOSIS — R296 Repeated falls: Secondary | ICD-10-CM | POA: Diagnosis present

## 2023-12-12 DIAGNOSIS — I5022 Chronic systolic (congestive) heart failure: Secondary | ICD-10-CM | POA: Diagnosis present

## 2023-12-12 DIAGNOSIS — E669 Obesity, unspecified: Secondary | ICD-10-CM | POA: Diagnosis present

## 2023-12-12 DIAGNOSIS — I462 Cardiac arrest due to underlying cardiac condition: Secondary | ICD-10-CM | POA: Diagnosis not present

## 2023-12-12 DIAGNOSIS — Z1152 Encounter for screening for COVID-19: Secondary | ICD-10-CM

## 2023-12-12 DIAGNOSIS — I482 Chronic atrial fibrillation, unspecified: Secondary | ICD-10-CM | POA: Diagnosis present

## 2023-12-12 DIAGNOSIS — E871 Hypo-osmolality and hyponatremia: Secondary | ICD-10-CM | POA: Diagnosis present

## 2023-12-12 DIAGNOSIS — I2510 Atherosclerotic heart disease of native coronary artery without angina pectoris: Secondary | ICD-10-CM | POA: Diagnosis not present

## 2023-12-12 DIAGNOSIS — I509 Heart failure, unspecified: Secondary | ICD-10-CM | POA: Diagnosis not present

## 2023-12-12 DIAGNOSIS — I272 Pulmonary hypertension, unspecified: Secondary | ICD-10-CM | POA: Diagnosis present

## 2023-12-12 DIAGNOSIS — F330 Major depressive disorder, recurrent, mild: Secondary | ICD-10-CM | POA: Diagnosis present

## 2023-12-12 DIAGNOSIS — J9601 Acute respiratory failure with hypoxia: Principal | ICD-10-CM | POA: Diagnosis present

## 2023-12-12 DIAGNOSIS — Z45018 Encounter for adjustment and management of other part of cardiac pacemaker: Secondary | ICD-10-CM

## 2023-12-12 DIAGNOSIS — Z8249 Family history of ischemic heart disease and other diseases of the circulatory system: Secondary | ICD-10-CM

## 2023-12-12 DIAGNOSIS — Z6837 Body mass index (BMI) 37.0-37.9, adult: Secondary | ICD-10-CM

## 2023-12-12 DIAGNOSIS — Z5912 Inadequate housing utilities: Secondary | ICD-10-CM

## 2023-12-12 DIAGNOSIS — I5A Non-ischemic myocardial injury (non-traumatic): Secondary | ICD-10-CM | POA: Diagnosis present

## 2023-12-12 DIAGNOSIS — J45909 Unspecified asthma, uncomplicated: Secondary | ICD-10-CM | POA: Diagnosis present

## 2023-12-12 DIAGNOSIS — F0390 Unspecified dementia without behavioral disturbance: Secondary | ICD-10-CM | POA: Diagnosis present

## 2023-12-12 DIAGNOSIS — E7800 Pure hypercholesterolemia, unspecified: Secondary | ICD-10-CM | POA: Diagnosis present

## 2023-12-12 DIAGNOSIS — D6869 Other thrombophilia: Secondary | ICD-10-CM | POA: Diagnosis present

## 2023-12-12 DIAGNOSIS — W1830XA Fall on same level, unspecified, initial encounter: Secondary | ICD-10-CM | POA: Diagnosis not present

## 2023-12-12 DIAGNOSIS — I255 Ischemic cardiomyopathy: Secondary | ICD-10-CM | POA: Diagnosis not present

## 2023-12-12 DIAGNOSIS — Z7901 Long term (current) use of anticoagulants: Secondary | ICD-10-CM

## 2023-12-12 DIAGNOSIS — G8929 Other chronic pain: Secondary | ICD-10-CM | POA: Diagnosis present

## 2023-12-12 DIAGNOSIS — Y92230 Patient room in hospital as the place of occurrence of the external cause: Secondary | ICD-10-CM | POA: Diagnosis not present

## 2023-12-12 DIAGNOSIS — R570 Cardiogenic shock: Secondary | ICD-10-CM | POA: Diagnosis present

## 2023-12-12 DIAGNOSIS — Z95810 Presence of automatic (implantable) cardiac defibrillator: Secondary | ICD-10-CM | POA: Diagnosis present

## 2023-12-12 DIAGNOSIS — Z555 Less than a high school diploma: Secondary | ICD-10-CM

## 2023-12-12 DIAGNOSIS — E119 Type 2 diabetes mellitus without complications: Secondary | ICD-10-CM | POA: Diagnosis present

## 2023-12-12 DIAGNOSIS — I493 Ventricular premature depolarization: Secondary | ICD-10-CM | POA: Diagnosis present

## 2023-12-12 DIAGNOSIS — I2489 Other forms of acute ischemic heart disease: Secondary | ICD-10-CM | POA: Diagnosis present

## 2023-12-12 DIAGNOSIS — K5900 Constipation, unspecified: Secondary | ICD-10-CM | POA: Diagnosis not present

## 2023-12-12 DIAGNOSIS — Z515 Encounter for palliative care: Secondary | ICD-10-CM

## 2023-12-12 DIAGNOSIS — F419 Anxiety disorder, unspecified: Secondary | ICD-10-CM | POA: Diagnosis present

## 2023-12-12 DIAGNOSIS — N401 Enlarged prostate with lower urinary tract symptoms: Secondary | ICD-10-CM | POA: Diagnosis present

## 2023-12-12 DIAGNOSIS — Z66 Do not resuscitate: Secondary | ICD-10-CM | POA: Diagnosis present

## 2023-12-12 DIAGNOSIS — I5082 Biventricular heart failure: Secondary | ICD-10-CM | POA: Diagnosis present

## 2023-12-12 DIAGNOSIS — R110 Nausea: Secondary | ICD-10-CM

## 2023-12-12 DIAGNOSIS — T502X5A Adverse effect of carbonic-anhydrase inhibitors, benzothiadiazides and other diuretics, initial encounter: Secondary | ICD-10-CM | POA: Diagnosis not present

## 2023-12-12 DIAGNOSIS — Z951 Presence of aortocoronary bypass graft: Secondary | ICD-10-CM

## 2023-12-12 DIAGNOSIS — I110 Hypertensive heart disease with heart failure: Secondary | ICD-10-CM | POA: Diagnosis present

## 2023-12-12 DIAGNOSIS — Z79899 Other long term (current) drug therapy: Secondary | ICD-10-CM

## 2023-12-12 DIAGNOSIS — Z7189 Other specified counseling: Secondary | ICD-10-CM | POA: Diagnosis not present

## 2023-12-12 DIAGNOSIS — E875 Hyperkalemia: Secondary | ICD-10-CM | POA: Diagnosis present

## 2023-12-12 DIAGNOSIS — I4729 Other ventricular tachycardia: Secondary | ICD-10-CM | POA: Diagnosis present

## 2023-12-12 DIAGNOSIS — E876 Hypokalemia: Secondary | ICD-10-CM | POA: Diagnosis present

## 2023-12-12 DIAGNOSIS — I5084 End stage heart failure: Secondary | ICD-10-CM | POA: Diagnosis present

## 2023-12-12 DIAGNOSIS — I10 Essential (primary) hypertension: Secondary | ICD-10-CM | POA: Diagnosis not present

## 2023-12-12 DIAGNOSIS — I252 Old myocardial infarction: Secondary | ICD-10-CM

## 2023-12-12 DIAGNOSIS — K219 Gastro-esophageal reflux disease without esophagitis: Secondary | ICD-10-CM | POA: Diagnosis present

## 2023-12-12 DIAGNOSIS — I2582 Chronic total occlusion of coronary artery: Secondary | ICD-10-CM | POA: Diagnosis present

## 2023-12-12 DIAGNOSIS — D638 Anemia in other chronic diseases classified elsewhere: Secondary | ICD-10-CM | POA: Diagnosis present

## 2023-12-12 DIAGNOSIS — N179 Acute kidney failure, unspecified: Secondary | ICD-10-CM | POA: Diagnosis not present

## 2023-12-12 DIAGNOSIS — E66812 Obesity, class 2: Secondary | ICD-10-CM | POA: Diagnosis present

## 2023-12-12 DIAGNOSIS — R0902 Hypoxemia: Secondary | ICD-10-CM

## 2023-12-12 DIAGNOSIS — I472 Ventricular tachycardia, unspecified: Secondary | ICD-10-CM | POA: Diagnosis not present

## 2023-12-12 DIAGNOSIS — R231 Pallor: Secondary | ICD-10-CM | POA: Diagnosis not present

## 2023-12-12 DIAGNOSIS — Z7984 Long term (current) use of oral hypoglycemic drugs: Secondary | ICD-10-CM

## 2023-12-12 DIAGNOSIS — I4821 Permanent atrial fibrillation: Secondary | ICD-10-CM | POA: Diagnosis present

## 2023-12-12 DIAGNOSIS — H5461 Unqualified visual loss, right eye, normal vision left eye: Secondary | ICD-10-CM | POA: Diagnosis present

## 2023-12-12 DIAGNOSIS — M545 Low back pain, unspecified: Secondary | ICD-10-CM | POA: Diagnosis present

## 2023-12-12 DIAGNOSIS — Z7951 Long term (current) use of inhaled steroids: Secondary | ICD-10-CM

## 2023-12-12 DIAGNOSIS — K5730 Diverticulosis of large intestine without perforation or abscess without bleeding: Secondary | ICD-10-CM | POA: Diagnosis present

## 2023-12-12 DIAGNOSIS — Z885 Allergy status to narcotic agent status: Secondary | ICD-10-CM

## 2023-12-12 LAB — COMPREHENSIVE METABOLIC PANEL WITH GFR
ALT: 13 U/L (ref 0–44)
AST: 31 U/L (ref 15–41)
Albumin: 4.3 g/dL (ref 3.5–5.0)
Alkaline Phosphatase: 107 U/L (ref 38–126)
Anion gap: 13 (ref 5–15)
BUN: 15 mg/dL (ref 8–23)
CO2: 27 mmol/L (ref 22–32)
Calcium: 9.8 mg/dL (ref 8.9–10.3)
Chloride: 97 mmol/L — ABNORMAL LOW (ref 98–111)
Creatinine, Ser: 1.09 mg/dL (ref 0.61–1.24)
GFR, Estimated: >60 mL/min (ref >60)
Glucose, Bld: 143 mg/dL — ABNORMAL HIGH (ref 70–99)
Potassium: 3.4 mmol/L — ABNORMAL LOW (ref 3.5–5.1)
Sodium: 138 mmol/L (ref 135–145)
Total Bilirubin: 1.5 mg/dL — ABNORMAL HIGH (ref 0.0–1.2)
Total Protein: 8.8 g/dL — ABNORMAL HIGH (ref 6.5–8.1)

## 2023-12-12 LAB — CBC WITH DIFFERENTIAL/PLATELET
Abs Immature Granulocytes: 0.01 K/uL (ref 0.00–0.07)
Basophils Absolute: 0.1 K/uL (ref 0.0–0.1)
Basophils Relative: 1 %
Eosinophils Absolute: 0 K/uL (ref 0.0–0.5)
Eosinophils Relative: 1 %
HCT: 48.9 % (ref 39.0–52.0)
Hemoglobin: 14.8 g/dL (ref 13.0–17.0)
Immature Granulocytes: 0 %
Lymphocytes Relative: 23 %
Lymphs Abs: 1.7 K/uL (ref 0.7–4.0)
MCH: 24.8 pg — ABNORMAL LOW (ref 26.0–34.0)
MCHC: 30.3 g/dL (ref 30.0–36.0)
MCV: 81.9 fL (ref 80.0–100.0)
Monocytes Absolute: 0.9 K/uL (ref 0.1–1.0)
Monocytes Relative: 13 %
Neutro Abs: 4.6 K/uL (ref 1.7–7.7)
Neutrophils Relative %: 62 %
Platelets: 314 K/uL (ref 150–400)
RBC: 5.97 MIL/uL — ABNORMAL HIGH (ref 4.22–5.81)
RDW: 22.3 % — ABNORMAL HIGH (ref 11.5–15.5)
WBC: 7.2 K/uL (ref 4.0–10.5)
nRBC: 0 % (ref 0.0–0.2)

## 2023-12-12 LAB — LIPASE, BLOOD: Lipase: 16 U/L (ref 11–51)

## 2023-12-12 LAB — TSH: TSH: 1.67 u[IU]/mL (ref 0.350–4.500)

## 2023-12-12 LAB — RESP PANEL BY RT-PCR (RSV, FLU A&B, COVID)  RVPGX2
Influenza A by PCR: NEGATIVE
Influenza B by PCR: NEGATIVE
Resp Syncytial Virus by PCR: NEGATIVE
SARS Coronavirus 2 by RT PCR: NEGATIVE

## 2023-12-12 LAB — TROPONIN T, HIGH SENSITIVITY: Troponin T High Sensitivity: 36 ng/L — ABNORMAL HIGH (ref 0–19)

## 2023-12-12 LAB — MAGNESIUM: Magnesium: 2 mg/dL (ref 1.7–2.4)

## 2023-12-12 LAB — PRO BRAIN NATRIURETIC PEPTIDE: Pro Brain Natriuretic Peptide: 5740 pg/mL — ABNORMAL HIGH (ref <300.0)

## 2023-12-12 LAB — GLUCOSE, CAPILLARY: Glucose-Capillary: 142 mg/dL — ABNORMAL HIGH (ref 70–99)

## 2023-12-12 MED ORDER — OXYCODONE HCL 5 MG PO TABS
5.0000 mg | ORAL_TABLET | Freq: Four times a day (QID) | ORAL | Status: DC | PRN
Start: 2023-12-12 — End: 2023-12-27
  Administered 2023-12-12 – 2023-12-27 (×27): 5 mg via ORAL
  Filled 2023-12-12 (×29): qty 1

## 2023-12-12 MED ORDER — EZETIMIBE 10 MG PO TABS
10.0000 mg | ORAL_TABLET | Freq: Every day | ORAL | Status: DC
  Administered 2023-12-13 – 2023-12-28 (×16): 10 mg via ORAL
  Filled 2023-12-12 (×16): qty 1

## 2023-12-12 MED ORDER — SODIUM CHLORIDE 0.9 % IV SOLN
12.5000 mg | Freq: Once | INTRAVENOUS | Status: AC
  Administered 2023-12-12: 12.5 mg via INTRAVENOUS
  Filled 2023-12-12: qty 0.5

## 2023-12-12 MED ORDER — POTASSIUM CHLORIDE 20 MEQ PO PACK
40.0000 meq | PACK | ORAL | Status: AC
  Administered 2023-12-12 – 2023-12-13 (×2): 40 meq via ORAL
  Filled 2023-12-12 (×2): qty 2

## 2023-12-12 MED ORDER — APIXABAN 5 MG PO TABS
5.0000 mg | ORAL_TABLET | Freq: Two times a day (BID) | ORAL | Status: DC
Start: 2023-12-12 — End: 2023-12-17
  Administered 2023-12-12 – 2023-12-16 (×9): 5 mg via ORAL
  Filled 2023-12-12 (×9): qty 1

## 2023-12-12 MED ORDER — PANTOPRAZOLE SODIUM 20 MG PO TBEC
20.0000 mg | DELAYED_RELEASE_TABLET | Freq: Every day | ORAL | Status: DC
  Administered 2023-12-13 – 2023-12-28 (×16): 20 mg via ORAL
  Filled 2023-12-12 (×16): qty 1

## 2023-12-12 MED ORDER — ONDANSETRON HCL 4 MG/2ML IJ SOLN
4.0000 mg | Freq: Four times a day (QID) | INTRAMUSCULAR | Status: DC | PRN
Start: 2023-12-12 — End: 2023-12-27
  Administered 2023-12-13 – 2023-12-23 (×3): 4 mg via INTRAVENOUS
  Filled 2023-12-12 (×3): qty 2

## 2023-12-12 MED ORDER — OXYCODONE-ACETAMINOPHEN 10-325 MG PO TABS: 1.0000 | ORAL_TABLET | Freq: Four times a day (QID) | ORAL | Status: DC | PRN

## 2023-12-12 MED ORDER — ONDANSETRON HCL 4 MG PO TABS
4.0000 mg | ORAL_TABLET | Freq: Four times a day (QID) | ORAL | Status: DC | PRN
  Administered 2023-12-13 – 2023-12-22 (×3): 4 mg via ORAL
  Filled 2023-12-12 (×3): qty 1

## 2023-12-12 MED ORDER — POTASSIUM CHLORIDE 20 MEQ PO PACK
40.0000 meq | PACK | Freq: Once | ORAL | Status: AC
Start: 2023-12-12 — End: 2023-12-12
  Administered 2023-12-12: 40 meq via ORAL
  Filled 2023-12-12: qty 2

## 2023-12-12 MED ORDER — FLUTICASONE FUROATE-VILANTEROL 200-25 MCG/ACT IN AEPB
1.0000 | INHALATION_SPRAY | Freq: Every day | RESPIRATORY_TRACT | Status: DC
  Administered 2023-12-13 – 2023-12-28 (×15): 1 via RESPIRATORY_TRACT
  Filled 2023-12-12 (×4): qty 28

## 2023-12-12 MED ORDER — TIZANIDINE HCL 4 MG PO TABS
4.0000 mg | ORAL_TABLET | Freq: Three times a day (TID) | ORAL | Status: DC | PRN
  Administered 2023-12-16 – 2023-12-27 (×15): 4 mg via ORAL
  Filled 2023-12-12 (×16): qty 1

## 2023-12-12 MED ORDER — ALBUTEROL SULFATE (2.5 MG/3ML) 0.083% IN NEBU: 2.5000 mg | INHALATION_SOLUTION | RESPIRATORY_TRACT | Status: DC | PRN

## 2023-12-12 MED ORDER — AMIODARONE HCL IN DEXTROSE 360-4.14 MG/200ML-% IV SOLN
30.0000 mg/h | INTRAVENOUS | Status: DC
  Administered 2023-12-13 – 2023-12-14 (×3): 30 mg/h via INTRAVENOUS
  Filled 2023-12-12 (×2): qty 200

## 2023-12-12 MED ORDER — FUROSEMIDE 10 MG/ML IJ SOLN
40.0000 mg | Freq: Once | INTRAMUSCULAR | Status: AC
  Administered 2023-12-13: 40 mg via INTRAVENOUS
  Filled 2023-12-12: qty 4

## 2023-12-12 MED ORDER — ROSUVASTATIN CALCIUM 20 MG PO TABS
40.0000 mg | ORAL_TABLET | Freq: Every day | ORAL | Status: DC
  Administered 2023-12-13 – 2023-12-28 (×16): 40 mg via ORAL
  Filled 2023-12-12 (×16): qty 2

## 2023-12-12 MED ORDER — HYDROCODONE-ACETAMINOPHEN 5-325 MG PO TABS
1.0000 | ORAL_TABLET | Freq: Once | ORAL | Status: AC
  Administered 2023-12-12: 1 via ORAL
  Filled 2023-12-12: qty 1

## 2023-12-12 MED ORDER — ACETAMINOPHEN 650 MG RE SUPP: 650.0000 mg | Freq: Four times a day (QID) | RECTAL | Status: DC | PRN

## 2023-12-12 MED ORDER — FUROSEMIDE 10 MG/ML IJ SOLN
40.0000 mg | INTRAMUSCULAR | Status: AC
  Administered 2023-12-12: 40 mg via INTRAVENOUS
  Filled 2023-12-12: qty 4

## 2023-12-12 MED ORDER — CITALOPRAM HYDROBROMIDE 20 MG PO TABS
20.0000 mg | ORAL_TABLET | Freq: Every day | ORAL | Status: DC
  Administered 2023-12-13 – 2023-12-28 (×16): 20 mg via ORAL
  Filled 2023-12-12 (×16): qty 1

## 2023-12-12 MED ORDER — METOPROLOL SUCCINATE ER 25 MG PO TB24
12.5000 mg | ORAL_TABLET | Freq: Every day | ORAL | Status: DC
Start: 2023-12-12 — End: 2023-12-14
  Administered 2023-12-12 – 2023-12-14 (×3): 12.5 mg via ORAL
  Filled 2023-12-12 (×3): qty 1

## 2023-12-12 MED ORDER — AMIODARONE LOAD VIA INFUSION
150.0000 mg | Freq: Once | INTRAVENOUS | Status: AC
  Administered 2023-12-12: 150 mg via INTRAVENOUS
  Filled 2023-12-12: qty 83.34

## 2023-12-12 MED ORDER — OXYCODONE-ACETAMINOPHEN 5-325 MG PO TABS
1.0000 | ORAL_TABLET | Freq: Four times a day (QID) | ORAL | Status: DC | PRN
  Administered 2023-12-12 – 2023-12-27 (×25): 1 via ORAL
  Filled 2023-12-12 (×25): qty 1

## 2023-12-12 MED ORDER — ONDANSETRON HCL 4 MG/2ML IJ SOLN
4.0000 mg | Freq: Once | INTRAMUSCULAR | Status: AC
  Administered 2023-12-12: 4 mg via INTRAVENOUS
  Filled 2023-12-12: qty 2

## 2023-12-12 MED ORDER — OXYCODONE HCL 5 MG PO TABS
5.0000 mg | ORAL_TABLET | ORAL | Status: AC
  Administered 2023-12-12: 5 mg via ORAL
  Filled 2023-12-12: qty 1

## 2023-12-12 MED ORDER — FLUTICASONE PROPIONATE 50 MCG/ACT NA SUSP
1.0000 | Freq: Every day | NASAL | Status: DC
  Administered 2023-12-13 – 2023-12-28 (×15): 1 via NASAL
  Filled 2023-12-12 (×2): qty 16

## 2023-12-12 MED ORDER — CHLORHEXIDINE GLUCONATE CLOTH 2 % EX PADS
6.0000 | MEDICATED_PAD | Freq: Every day | CUTANEOUS | Status: DC
  Administered 2023-12-13 – 2023-12-27 (×14): 6 via TOPICAL

## 2023-12-12 MED ORDER — ORAL CARE MOUTH RINSE
15.0000 mL | OROMUCOSAL | Status: DC | PRN
  Administered 2023-12-21: 15 mL via OROMUCOSAL

## 2023-12-12 MED ORDER — POLYETHYLENE GLYCOL 3350 17 G PO PACK
17.0000 g | PACK | Freq: Every day | ORAL | Status: DC | PRN
  Administered 2023-12-14 – 2023-12-21 (×4): 17 g via ORAL
  Filled 2023-12-12 (×4): qty 1

## 2023-12-12 MED ORDER — AMIODARONE HCL IN DEXTROSE 360-4.14 MG/200ML-% IV SOLN
60.0000 mg/h | INTRAVENOUS | Status: AC
  Administered 2023-12-12 (×2): 60 mg/h via INTRAVENOUS
  Filled 2023-12-12 (×3): qty 200

## 2023-12-12 MED ORDER — ACETAMINOPHEN 325 MG PO TABS
650.0000 mg | ORAL_TABLET | Freq: Four times a day (QID) | ORAL | Status: DC | PRN
  Administered 2023-12-13 – 2023-12-15 (×2): 650 mg via ORAL
  Filled 2023-12-12 (×2): qty 2

## 2023-12-12 MED ORDER — PROMETHAZINE HCL 25 MG/ML IJ SOLN
INTRAMUSCULAR | Status: AC
  Filled 2023-12-12: qty 1

## 2023-12-12 NOTE — ED Notes (Signed)
 Attempted to call report 3 times to inpatient. Inpatient timer at 40 mins, so sending pt upstairs.

## 2023-12-12 NOTE — Progress Notes (Signed)
 Patient sats were 86% upon arrival to room. RT placed patient on 4L nasal cannula with improvement in sats to 92%

## 2023-12-12 NOTE — ED Provider Notes (Signed)
 [facility] EMERGENCY DEPARTMENT AT [facility] Provider Note   CSN: [id number] Arrival date & time: 12/12/23  1118     Patient presents with: Emesis   Douglas [name] is a 68 y.o. male.   68 year old male with a history of atrial fibrillation on Eliquis , CHF (25 to 30%) status post ICD, history of V. tach, CAD, asthma, chronic pain, and opioid dependence who presents emergency department nausea.  Patient reports that over the past 2 days has been having nausea.  Denies any vomiting to me.  Last bowel movement was yesterday.  Decreased flatus.  Says he is feeling an urge to pee but cannot.  No abdominal pain.  Says he is also having some congestion and feels overall sick.       Prior to Admission medications   Medication Sig Start Date End Date Taking? Authorizing Provider  albuterol  (VENTOLIN  HFA) 108 (90 Base) MCG/ACT inhaler Inhale 2 puffs into the lungs every 6 (six) hours as needed for wheezing or shortness of breath. 11/22/23   Grooms, Courtney, PA-C  ALPRAZolam  (XANAX ) 1 MG tablet Take 1 tablet (1 mg total) by mouth at bedtime as needed for anxiety. 11/27/23   Grooms, Courtney, PA-C  Carboxymethylcellulose Sodium (ARTIFICIAL TEARS OP) Place 1 drop into the right eye 3 (three) times daily.    [provider]  citalopram  (CELEXA ) 20 MG tablet Take 20 mg by mouth daily. 04/05/17   [provider]  ELIQUIS  5 MG TABS tablet TAKE ONE TABLET BY MOUTH TWICE DAILY 08/28/18   Lavona Agent, MD  ezetimibe  (ZETIA ) 10 MG tablet Take 10 mg by mouth daily. 06/01/21   [provider]  fluticasone  (FLONASE ) 50 MCG/ACT nasal spray Place 1 spray into both nostrils daily.    [provider]  fluticasone -salmeterol (ADVAIR) 250-50 MCG/ACT AEPB Inhale 1 puff into the lungs in the morning and at bedtime. 10/21/23   [provider]  JARDIANCE  10 MG TABS tablet Take 10 mg by mouth daily. 06/27/21   [provider]  lansoprazole (PREVACID) 30 MG  capsule Take 30 mg by mouth daily.      [provider]  metoprolol  succinate (TOPROL -XL) 25 MG 24 hr tablet Take 0.5 tablets (12.5 mg total) by mouth daily. 12/02/23   Grooms, [place], PA-C  Omega-3 Fatty Acids (FISH OIL) 1200 MG CAPS Take 1,200 mg by mouth daily.    [provider]  oxyCODONE -acetaminophen  (PERCOCET) 10-325 MG tablet Take 1 tablet by mouth every 6 (six) hours as needed for pain (use medication sparingly for severe pain only). 12/02/23   Grooms, Courtney, PA-C  rosuvastatin  (CRESTOR ) 40 MG tablet TAKE ONE (1) TABLET EACH DAY 10/18/20   Lavona Agent, MD  spironolactone  (ALDACTONE ) 25 MG tablet Take 25 mg by mouth daily.    [provider]  tiZANidine  (ZANAFLEX ) 4 MG tablet Take 4 mg by mouth every 8 (eight) hours as needed for muscle spasms. 09/29/11   [provider]  torsemide  (DEMADEX ) 20 MG tablet Take 1 tablet (20 mg total) by mouth daily. 06/01/23   Evonnie Lenis, MD    Allergies: Xarelto  [rivaroxaban ], Codeine, Lasix  [furosemide ], and Vioxx [rofecoxib]    Review of Systems  Updated Vital Signs BP 125/85   Pulse (!) 102   Temp 98.3 F (36.8 C) (Oral)   Resp 14   Wt 130 kg   SpO2 95%   BMI 42.32 kg/m   Physical Exam Vitals and nursing note reviewed.  Constitutional:  General: He is not in acute distress.    Appearance: He is well-developed.  HENT:     Head: Normocephalic and atraumatic.     Right Ear: External ear normal.     Left Ear: External ear normal.     Nose: Nose normal.  Eyes:     Extraocular Movements: Extraocular movements intact.     Conjunctiva/sclera: Conjunctivae normal.     Pupils: Pupils are equal, round, and reactive to light.  Cardiovascular:     Rate and Rhythm: Tachycardia present. Rhythm irregular.     Heart sounds: Normal heart sounds.  Pulmonary:     Effort: Pulmonary effort is normal. No respiratory distress.     Breath sounds: No wheezing.     Comments: Diminished breath sounds  bilaterally Abdominal:     General: There is no distension.     Palpations: Abdomen is soft. There is no mass.     Tenderness: There is no abdominal tenderness. There is no guarding.  Musculoskeletal:     Cervical back: Normal range of motion and neck supple.     Right lower leg: Edema present.     Left lower leg: Edema present.  Skin:    General: Skin is warm and dry.  Neurological:     Mental Status: He is alert. Mental status is at baseline.  Psychiatric:        Mood and Affect: Mood normal.        Behavior: Behavior normal.     (all labs ordered are listed, but only abnormal results are displayed) Labs Reviewed  CBC WITH DIFFERENTIAL/PLATELET - Abnormal; Notable for the following components:      Result Value   RBC 5.97 (*)    MCH 24.8 (*)    RDW 22.3 (*)    All other components within normal limits  COMPREHENSIVE METABOLIC PANEL WITH GFR - Abnormal; Notable for the following components:   Potassium 3.4 (*)    Chloride 97 (*)    Glucose, Bld 143 (*)    Total Protein 8.8 (*)    Total Bilirubin 1.5 (*)    All other components within normal limits  PRO BRAIN NATRIURETIC PEPTIDE - Abnormal; Notable for the following components:   Pro Brain Natriuretic Peptide 5,740.0 (*)    All other components within normal limits  RESP PANEL BY RT-PCR (RSV, FLU A&B, COVID)  RVPGX2  LIPASE, BLOOD  URINALYSIS, ROUTINE W REFLEX MICROSCOPIC    EKG: EKG Interpretation Date/Time:  Thursday December 12 2023 11:40:22 EDT Ventricular Rate:  96 PR Interval:    QRS Duration:  112 QT Interval:  380 QTC Calculation: 481 R Axis:   86  Text Interpretation: Atrial fibrillation Paired ventricular premature complexes Aberrant conduction of SV complex(es) Anterior infarct, old Nonspecific T abnormalities, inferior leads Confirmed by Yolande Charleston [phone]) on 12/12/2023 12:01:53 PM  Radiology: DG Chest 2 View Result Date: 12/12/2023 CLINICAL DATA:  Shortness of breath. Nausea and vomiting for  the past 2 days. EXAM: CHEST - 2 VIEW COMPARISON:  05/29/2023 FINDINGS: Stable enlarged cardiac silhouette, post CABG changes and right subclavian pacer and AICD leads. Clear lungs with normal vascularity. Tortuous and partially calcified thoracic aorta. Thoracolumbar spine degenerative changes. IMPRESSION: 1. No acute abnormality. 2. Stable cardiomegaly. Electronically Signed   By: Elspeth Bathe M.D.   On: 12/12/2023 13:54   CT ABDOMEN PELVIS WO CONTRAST Result Date: 12/12/2023 CLINICAL DATA:  Nausea and decreased urination. EXAM: CT ABDOMEN AND PELVIS WITHOUT CONTRAST TECHNIQUE: Multidetector CT imaging  of the abdomen and pelvis was performed following the standard protocol without IV contrast. RADIATION DOSE REDUCTION: This exam was performed according to the departmental dose-optimization program which includes automated exposure control, adjustment of the mA and/or kV according to patient size and/or use of iterative reconstruction technique. COMPARISON:  CT pelvis dated 05/30/2023 FINDINGS: Evaluation of this exam is limited in the absence of intravenous contrast. Lower chest: The visualized lung bases are clear. Pacemaker wires noted. No intra-abdominal free air or free fluid. Hepatobiliary: There is mild irregularity of the liver contour most consistent with changes of cirrhosis. Clinical correlation recommended. No biliary ductal dilatation. The gallbladder is unremarkable. Pancreas: Unremarkable. No pancreatic ductal dilatation or surrounding inflammatory changes. Spleen: Normal in size without focal abnormality. Adrenals/Urinary Tract: The adrenal glands unremarkable. There is no hydronephrosis or nephrolithiasis on either side. The visualized ureters and urinary bladder appear unremarkable. Stomach/Bowel: Postsurgical changes of bowel with anastomotic staple line in the rectum. There is sigmoid diverticulosis. There is no bowel obstruction or active inflammation. The appendix is normal.  Vascular/Lymphatic: Moderate aortoiliac atherosclerotic disease. The IVC is unremarkable. No portal venous gas. There is no adenopathy. Reproductive: The prostate and seminal vesicles are grossly remarkable. Other: None Musculoskeletal: Degenerative changes of the spine. No acute osseous pathology. IMPRESSION: 1. No acute intra-abdominal or pelvic pathology. No hydronephrosis or nephrolithiasis. 2. Sigmoid diverticulosis. No bowel obstruction. Normal appendix. 3.  Aortic Atherosclerosis (ICD10-I70.0). Electronically Signed   By: Vanetta Chou M.D.   On: 12/12/2023 13:23     Procedures   Medications Ordered in the ED  metoprolol  succinate (TOPROL -XL) 24 hr tablet 12.5 mg (12.5 mg Oral Given 12/12/23 1535)  ondansetron  (ZOFRAN ) injection 4 mg (4 mg Intravenous Given 12/12/23 1230)  furosemide  (LASIX ) injection 40 mg (40 mg Intravenous Given 12/12/23 1536)  oxyCODONE  (Oxy IR/ROXICODONE ) immediate release tablet 5 mg (5 mg Oral Given 12/12/23 1613)    Clinical Course as of 12/12/23 1616  Thu Dec 12, 2023  1543 Dr Pearlean from hospitalist to admit [RP]    Clinical Course User Index [RP] Yolande Lamar BROCKS, MD                                 Medical Decision Making Amount and/or Complexity of Data Reviewed Labs: ordered. Radiology: ordered.  Risk Prescription drug management. Decision regarding hospitalization.   Douglas [name] is a 68 year old male with a history of atrial fibrillation on Eliquis , CHF (25 to 30%) status post ICD, history of V. tach, CAD, asthma, chronic pain, and opioid dependence who presents emergency department nausea.   Initial Ddx:  Gastroenteritis, pancreatitis, constipation, ileus, bowel obstruction, AKI, urinary retention  MDM/Course:  Patient presents emergency department nausea.  No vomiting.  Also reports some urinary urgency.  On exam was found to be newly hypoxic with a new 3 L oxygen requirement.  Does have some lower extremity edema.  Also has  diminished breath sounds bilaterally.  I do not appreciate any wheezing.  No significant abdominal tenderness to palpation.  His bladder scan did not show that he was retaining urine and had 60 mL.  He underwent a CT of the abdomen pelvis that did not show acute abnormality.  Chest x-ray without significant pulmonary edema but upon re-evaluation was still hypoxic.  BNP elevated to 5700.  COVID and flu negative.  Suspect that he may be having a heart failure exacerbation causing his hypoxia.  He is already on  5 mg of Eliquis  twice daily and reports compliance with this medication so feel PE is highly unlikely.  Discussed with hospitalist for admission for diuresis.  This patient presents to the ED for concern of complaints listed in HPI, this involves an extensive number of treatment options, and is a complaint that carries with it a high risk of complications and morbidity. Disposition including potential need for admission considered.   Dispo: Admit to Floor  Records reviewed Outpatient Clinic Notes The following labs were independently interpreted: Chemistry and show no acute abnormality I independently reviewed the following imaging with scope of interpretation limited to determining acute life threatening conditions related to emergency care: Chest x-ray and agree with the radiologist interpretation with the following exceptions: none I personally reviewed and interpreted cardiac monitoring: atrial fibrillation (normal rate) I personally reviewed and interpreted the pt's EKG: see above for interpretation  I have reviewed the patients home medications and made adjustments as needed Consults: Hospitalist  Portions of this note were generated with Scientist, clinical (histocompatibility and immunogenetics). Dictation errors may occur despite best attempts at proofreading.     Final diagnoses:  Nausea  Acute on chronic congestive heart failure, unspecified heart failure type [facility])  Hypoxia    ED Discharge Orders     None           Yolande Lamar BROCKS, MD 12/12/23 1616

## 2023-12-12 NOTE — Telephone Encounter (Signed)
 Patient is currently in the ED per chart review. Will route encounter to office.

## 2023-12-12 NOTE — ED Triage Notes (Signed)
 PT bib [facility] for N/V. Pt complaining of N/V for the last 2 days. Pt says he has the urge to pee and cannot.  BP 146/96 HR 116 BS 154

## 2023-12-12 NOTE — Telephone Encounter (Signed)
 Mid triage patient received another call and disconnected with this writer let me call you back.     Copied from CRM [id number]. Topic: Clinical - Red Word Triage >> Dec 12, 2023  9:40 AM Gustabo D wrote: Pt was told to call back if he got sick again he says he's still sick and nauseous and can't sleep. Pt says he isn't going to the bathroom right and not peeing right. Answer Assessment - Initial Assessment Questions Additional info:   Mid triage patient received another call and stated Let me call you back he then disconnected with this Clinical research associate.   1. SYMPTOM: What's the main symptom you're concerned about? (e.g., frequency, incontinence)     Not peeing right on torsemide  and hydrochlorothiazide  2. ONSET: When did the urinary symptoms start?     Several days 3. PAIN: Is there any pain? If Yes, ask: How bad is it? (Scale: 1-10; mild, moderate, severe)     denies 4. CAUSE: What do you think is causing the symptoms?     unsure 5. OTHER SYMPTOMS: Do you have any other symptoms? (e.g., blood in urine, fever, flank pain, pain with urination)     Nausea, difficulty sleeping  Protocols used: Urinary Symptoms-A-AH
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
## 2023-12-12 NOTE — ED Notes (Signed)
 Went in to check on pt due to secretary stating pt in irregular heart beat. Pt a/o with no complaints. Showing VT rate of 188 on monitor. Pt asymptomatic. Zoll pads placed. Resituated leads, pt still in VT, code called and code cart brought to room, Dr Towana and DR Pearlean at beside after pt converted. Pt blew on syringe with no relief. Pt converted to afib in 90s after baring down. Pt remained in VT for approx 5-7 minutes.

## 2023-12-12 NOTE — Progress Notes (Signed)
 ON-CALL CARDIOLOGY 12/12/23  Patient's name: Douglas [name].   MRN: [id number].    DOB: March 29, 1955 Primary care provider: Grooms, [place], [place]. Primary cardiologist: Lynwood Schilling, MD   Interaction regarding this patient's care today: Attending physician, Emokpae, Ejiroghene E, MD, reached out to review his case.  Patient presented to [facility] complaining of not feeling well, nausea, decreased urine output, abdominal pain.  He was noted to be hypoxic-86% on room air.  Placed on nasal cannula oxygen. Being monitored in the ED for placement on medical floors.  Telemetry noted episodes of sustained ventricular tachycardia.  Hemodynamically patient was noted to be stable.  He converted back into A-fib based on my discussion with attending physician.  Upon examining the patient attending physician asked if he appreciated an ICD shock and patient denied.  Currently he is hemodynamically stable and cardiology was called for further recommendations.  CV strips viewed.   Impression: Ventricular tachycardia Cardiomyopathy Acute on Chronic HFrEF Afib cVR CAD. Hyperlipidemia. Hypertension  Recommendations: Recommend transfer to [facility], preferably to 2H or 2C Start amiodarone drip. Interrogate the pacemaker device. Replace electrolytes. Diurese Echo with Definity  to evaluate LVEF Reach out if change in hemodynamics or change in clinical status.  Telephone encounter total time: 10 minutes   Madonna Large, DO, [facility]  A Division of [facility] [facility] [address], [place], [place] [address]  12/12/2023 6:49 PM

## 2023-12-13 ENCOUNTER — Encounter (HOSPITAL_COMMUNITY): Payer: Self-pay | Admitting: Internal Medicine

## 2023-12-13 ENCOUNTER — Inpatient Hospital Stay (HOSPITAL_COMMUNITY)

## 2023-12-13 ENCOUNTER — Other Ambulatory Visit: Payer: Self-pay

## 2023-12-13 DIAGNOSIS — J9601 Acute respiratory failure with hypoxia: Secondary | ICD-10-CM

## 2023-12-13 DIAGNOSIS — I5023 Acute on chronic systolic (congestive) heart failure: Secondary | ICD-10-CM

## 2023-12-13 DIAGNOSIS — I4729 Other ventricular tachycardia: Secondary | ICD-10-CM | POA: Diagnosis not present

## 2023-12-13 DIAGNOSIS — I472 Ventricular tachycardia, unspecified: Secondary | ICD-10-CM | POA: Diagnosis not present

## 2023-12-13 LAB — TROPONIN I (HIGH SENSITIVITY): Troponin I (High Sensitivity): 53 ng/L — ABNORMAL HIGH (ref <18)

## 2023-12-13 LAB — URINALYSIS, ROUTINE W REFLEX MICROSCOPIC
Bilirubin Urine: NEGATIVE
Glucose, UA: 50 mg/dL — AB
Hgb urine dipstick: NEGATIVE
Ketones, ur: NEGATIVE mg/dL
Leukocytes,Ua: NEGATIVE
Nitrite: NEGATIVE
Protein, ur: 100 mg/dL — AB
Specific Gravity, Urine: 1.021 (ref 1.005–1.030)
pH: 5 (ref 5.0–8.0)

## 2023-12-13 LAB — BASIC METABOLIC PANEL WITH GFR
Anion gap: 14 (ref 5–15)
Anion gap: 12 (ref 5–15)
BUN: 21 mg/dL (ref 8–23)
BUN: 27 mg/dL — ABNORMAL HIGH (ref 8–23)
CO2: 20 mmol/L — ABNORMAL LOW (ref 22–32)
CO2: 24 mmol/L (ref 22–32)
Calcium: 9.5 mg/dL (ref 8.9–10.3)
Calcium: 9.2 mg/dL (ref 8.9–10.3)
Chloride: 100 mmol/L (ref 98–111)
Chloride: 101 mmol/L (ref 98–111)
Creatinine, Ser: 1.86 mg/dL — ABNORMAL HIGH (ref 0.61–1.24)
Creatinine, Ser: 2.02 mg/dL — ABNORMAL HIGH (ref 0.61–1.24)
GFR, Estimated: 39 mL/min — ABNORMAL LOW (ref >60)
GFR, Estimated: 35 mL/min — ABNORMAL LOW (ref >60)
Glucose, Bld: 100 mg/dL — ABNORMAL HIGH (ref 70–99)
Glucose, Bld: 115 mg/dL — ABNORMAL HIGH (ref 70–99)
Potassium: 5.9 mmol/L — ABNORMAL HIGH (ref 3.5–5.1)
Potassium: 5.1 mmol/L (ref 3.5–5.1)
Sodium: 134 mmol/L — ABNORMAL LOW (ref 135–145)
Sodium: 137 mmol/L (ref 135–145)

## 2023-12-13 LAB — ECHOCARDIOGRAM COMPLETE
Height: 69 in
MV M vel: 4.32 m/s
MV Peak grad: 74.6 mmHg
S' Lateral: 5.7 cm
Weight: 3911.84 [oz_av]

## 2023-12-13 LAB — COOXEMETRY PANEL
Carboxyhemoglobin: 2.4 % — ABNORMAL HIGH (ref 0.5–1.5)
Methemoglobin: 1.9 % — ABNORMAL HIGH (ref 0.0–1.5)
O2 Saturation: 72.7 %
Total hemoglobin: 15 g/dL (ref 12.0–16.0)

## 2023-12-13 LAB — MAGNESIUM: Magnesium: 2 mg/dL (ref 1.7–2.4)

## 2023-12-13 LAB — LACTIC ACID, PLASMA
Lactic Acid, Venous: 1.4 mmol/L (ref 0.5–1.9)
Lactic Acid, Venous: 1.5 mmol/L (ref 0.5–1.9)

## 2023-12-13 LAB — CBC
HCT: 51.5 % (ref 39.0–52.0)
Hemoglobin: 15.4 g/dL (ref 13.0–17.0)
MCH: 24.6 pg — ABNORMAL LOW (ref 26.0–34.0)
MCHC: 29.9 g/dL — ABNORMAL LOW (ref 30.0–36.0)
MCV: 82.1 fL (ref 80.0–100.0)
Platelets: 341 K/uL (ref 150–400)
RBC: 6.27 MIL/uL — ABNORMAL HIGH (ref 4.22–5.81)
RDW: 22.1 % — ABNORMAL HIGH (ref 11.5–15.5)
WBC: 8.6 K/uL (ref 4.0–10.5)
nRBC: 0 % (ref 0.0–0.2)

## 2023-12-13 LAB — MRSA NEXT GEN BY PCR, NASAL: MRSA by PCR Next Gen: NOT DETECTED

## 2023-12-13 LAB — GLUCOSE, CAPILLARY: Glucose-Capillary: 128 mg/dL — ABNORMAL HIGH (ref 70–99)

## 2023-12-13 MED ORDER — POLYVINYL ALCOHOL 1.4 % OP SOLN
1.0000 [drp] | OPHTHALMIC | Status: DC | PRN
  Administered 2023-12-14 – 2023-12-27 (×6): 1 [drp] via OPHTHALMIC
  Filled 2023-12-13 (×2): qty 15

## 2023-12-13 MED ORDER — PERFLUTREN LIPID MICROSPHERE
1.0000 mL | INTRAVENOUS | Status: AC | PRN
  Administered 2023-12-13: 3 mL via INTRAVENOUS

## 2023-12-13 MED ORDER — SODIUM ZIRCONIUM CYCLOSILICATE 10 G PO PACK
10.0000 g | PACK | Freq: Once | ORAL | Status: AC
  Administered 2023-12-13: 10 g via ORAL
  Filled 2023-12-13: qty 1

## 2023-12-13 MED ORDER — MAGNESIUM SULFATE 2 GM/50ML IV SOLN: 2.0000 g | Freq: Once | INTRAVENOUS | Status: DC

## 2023-12-13 MED ORDER — SALINE SPRAY 0.65 % NA SOLN
1.0000 | NASAL | Status: DC | PRN
  Administered 2023-12-14 – 2023-12-26 (×5): 1 via NASAL
  Filled 2023-12-13 (×2): qty 44

## 2023-12-13 MED ORDER — ALPRAZOLAM 0.5 MG PO TABS
1.0000 mg | ORAL_TABLET | Freq: Two times a day (BID) | ORAL | Status: DC | PRN
  Administered 2023-12-14 – 2023-12-27 (×14): 1 mg via ORAL
  Filled 2023-12-13 (×14): qty 2

## 2023-12-13 MED ORDER — ALPRAZOLAM 0.5 MG PO TABS
1.0000 mg | ORAL_TABLET | Freq: Once | ORAL | Status: AC
  Administered 2023-12-13: 1 mg via ORAL
  Filled 2023-12-13: qty 2

## 2023-12-13 NOTE — Consult Note (Addendum)
 Advanced Heart Failure Team Consult Note   Primary Physician: Grooms, [place], [place] Cardiologist:  Danelle [place], MD  Reason for Consultation: acute on chronic systolic heart failure and VT  HPI:    HENCE DERRICK is seen today for evaluation of acute on chronic systolic heart failure at the request of Dr. Arlice, Internal Medicine.   68 y.o. male with a hx of chronic systolic heart failure (EF 10-15% in 05/2017, 35-40% on last echo 4/25), VT s/p ICD placement 07/2017, CAD s/p PCI (1996) and 4v CABG (2002) with Cath 2019 at [facility] 4/4 patent bypass grafts; 40 - 60% stenosis in body of SVG to OM-2 patent LIMA-LAD, SVG-OM2/OM3 with med mgmt), HTN, HLD, permanent atrial fibrillation on apixaban , asthma, anxiety disorder, chronic pain, opioid dependent, GERD. Also w/ h/o poor med compliance.   Transfer[facility] to [facility] overnight for VT    He presented primarily to the [facility] ED w/ CC of N/V and urinary urgency. Was noted to be hypoxic w/ volume overload. C/w suggestive of acute CHF. BNP elevated 5700. Also noted to have an episode of sustained VT treated w/ ATP, converted in Afib w/ low 100s.  Rhytym strip shows rate 131 bpm. K 3.4, Mg 2.0, Hs trop 21>>53, Na 138, CO2 27, BUN 15, SCr 1.09, LFTs normal.    He was started on amio gtt, given K supplementation and started on IV Lasix  for diuresis. Transferred to [facility] for further care.   Echo pending   Optivol suggest volume overload.   Home Medications Prior to Admission medications   Medication Sig Start Date End Date Taking? Authorizing Provider  albuterol  (VENTOLIN  HFA) 108 (90 Base) MCG/ACT inhaler Inhale 2 puffs into the lungs every 6 (six) hours as needed for wheezing or shortness of breath. 11/22/23  Yes Grooms, Courtney, PA-C  ALPRAZolam  (XANAX ) 1 MG tablet Take 1 tablet (1 mg total) by mouth at bedtime as needed for anxiety. Patient taking differently: Take 1 mg by mouth 2 (two) times daily as needed for anxiety. 11/27/23  Yes Grooms,  Courtney, PA-C  Carboxymethylcellulose Sodium (ARTIFICIAL TEARS OP) Place 1 drop into both eyes 3 (three) times daily.   Yes [provider]  citalopram  (CELEXA ) 20 MG tablet Take 20 mg by mouth daily. 04/05/17  Yes [provider]  ELIQUIS  5 MG TABS tablet TAKE ONE TABLET BY MOUTH TWICE DAILY 08/28/18  Yes Lavona Agent, MD  ezetimibe  (ZETIA ) 10 MG tablet Take 10 mg by mouth daily. 06/01/21  Yes [provider]  fluticasone  (FLONASE ) 50 MCG/ACT nasal spray Place 1 spray into both nostrils daily.   Yes [provider]  fluticasone -salmeterol (ADVAIR) 250-50 MCG/ACT AEPB Inhale 1 puff into the lungs in the morning and at bedtime. 10/21/23  Yes [provider]  JARDIANCE  10 MG TABS tablet Take 10 mg by mouth daily. 06/27/21  Yes [provider]  lansoprazole (PREVACID) 30 MG capsule Take 30 mg by mouth daily.     Yes [provider]  loratadine  (CLARITIN ) 10 MG tablet Take 10 mg by mouth daily.   Yes [provider]  metoprolol  succinate (TOPROL -XL) 25 MG 24 hr tablet Take 0.5 tablets (12.5 mg total) by mouth daily. 12/02/23  Yes Grooms, [place], PA-C  Omega-3 Fatty Acids (FISH OIL) 1200 MG CAPS Take 1,200 mg by mouth daily.   Yes [provider]  oxyCODONE -acetaminophen  (PERCOCET) 10-325 MG tablet Take 1 tablet by mouth every 6 (six) hours as needed for pain (use medication sparingly  for severe pain only). 12/02/23  Yes Grooms, Courtney, PA-C  rosuvastatin  (CRESTOR ) 40 MG tablet TAKE ONE (1) TABLET EACH DAY 10/18/20  Yes Lavona Agent, MD  tiZANidine  (ZANAFLEX ) 4 MG tablet Take 4 mg by mouth every 8 (eight) hours as needed for muscle spasms. 09/29/11  Yes [provider]  torsemide  (DEMADEX ) 20 MG tablet Take 1 tablet (20 mg total) by mouth daily. 06/01/23  Yes TatAlm, MD    Past Medical History: Past Medical History:  Diagnosis Date   A-fib [facility])    AICD (automatic cardioverter/defibrillator) present    Anemia     Asthma    CAD (coronary artery disease)    CHF (congestive heart failure) (HCC)    Diabetes mellitus without complication (HCC)    Dyslipidemia    Elevated cholesterol    Hypertension     Past Surgical History: Past Surgical History:  Procedure Laterality Date   CARDIAC CATHETERIZATION  1996   stent placement   CARDIAC DEFIBRILLATOR PLACEMENT     CORONARY ARTERY BYPASS GRAFT  2002   times 4   CORONARY ARTERY BYPASS GRAFT     EYE SURGERY     ICD GENERATOR CHANGEOUT N/A 09/30/2018   Procedure: ICD GENERATOR CHANGEOUT;  Surgeon: Waddell Danelle ORN, MD;  Location: [facility] INVASIVE CV LAB;  Service: Cardiovascular;  Laterality: N/A;   IMPLANTABLE CARDIOVERTER DEFIBRILLATOR (ICD) GENERATOR CHANGE N/A 09/10/2011   Procedure: ICD GENERATOR CHANGE;  Surgeon: Danelle ORN Waddell, MD;  Location: [facility] CATH LAB;  Service: Cardiovascular;  Laterality: N/A;   INSERT / REPLACE / REMOVE PACEMAKER     LEFT HEART CATHETERIZATION WITH CORONARY ANGIOGRAM N/A 08/29/2011   Procedure: LEFT HEART CATHETERIZATION WITH CORONARY ANGIOGRAM;  Surgeon: Peter M Swaziland, MD;  Location: [facility] CATH LAB;  Service: Cardiovascular;  Laterality: N/A;    Family History: Family History  Problem Relation Age of Onset   Heart attack Father     Social History: Social History   Socioeconomic History   Marital status: Divorced    Spouse name: Not on file   Number of children: Not on file   Years of education: Not on file   Highest education level: 10th grade  Occupational History   Occupation: Retired  Tobacco Use   Smoking status: Never   Smokeless tobacco: Never  Vaping Use   Vaping status: Never Used  Substance and Sexual Activity   Alcohol  use: Never   Drug use: Never    Comment: marijuana   Sexual activity: Not on file  Other Topics Concern   Not on file  Social History Narrative   ** Merged History Encounter **       Social Drivers of Health   Financial Resource Strain: Not on file  Food Insecurity: No Food  Insecurity (12/13/2023)   Hunger Vital Sign    Worried About Running Out of Food in the Last Year: Never true    Ran Out of Food in the Last Year: Never true  Transportation Needs: No Transportation Needs (12/13/2023)   PRAPARE - Administrator, Civil Service (Medical): No    Lack of Transportation (Non-Medical): No  Physical Activity: Not on file  Stress: Not on file  Social Connections: Moderately Isolated (12/13/2023)   Social Connection and Isolation Panel    Frequency of Communication with Friends and Family: Three times a week    Frequency of Social Gatherings with Friends and Family: Once a week    Attends Religious Services: 1 to 4  times per year    Active Member of Clubs or Organizations: No    Attends Banker Meetings: Never    Marital Status: Divorced    Allergies:  Allergies  Allergen Reactions   Xarelto  [Rivaroxaban ] Other (See Comments)    Bleeding ulcers   Codeine Hives and Itching   Lasix  [Furosemide ] Other (See Comments)    Patient says he gets light headed and dizzy.    Vioxx [Rofecoxib] Palpitations and Other (See Comments)    Caused massive heart attack    Objective:    Vital Signs:   Temp:  [97.8 F (36.6 C)] 97.8 F (36.6 C) (10/17 1138) Pulse Rate:  [57-127] 106 (10/17 1100) Resp:  [14-43] 19 (10/17 1100) BP: (73-170)/(32-138) 116/84 (10/17 1100) SpO2:  [90 %-95 %] 92 % (10/17 1100) Weight:  [110.9 kg] 110.9 kg (10/16 2145) Last BM Date : 12/13/23  Weight change: Filed Weights   12/12/23 1131 12/12/23 2130 12/12/23 2145  Weight: 130 kg 110.9 kg 110.9 kg    Intake/Output:   Intake/Output Summary (Last 24 hours) at 12/13/2023 1155 Last data filed at 12/13/2023 1000 Gross per 24 hour  Intake 575.39 ml  Output --  Net 575.39 ml      Physical Exam    General:  Well appearing. No resp difficulty HEENT: normal Neck: JVD elevated  Cor: Irregularly irregular rate and rhythm.  No rubs, gallops or murmurs. Lungs:  diminished at the bases  Abdomen: soft, nontender, nondistended.  Extremities: no cyanosis, clubbing, rash, 1+ b/l LE edema Neuro: alert & orientedx3, cranial nerves grossly intact. moves all 4 extremities w/o difficulty. Affect pleasant   Telemetry   Afib low 100s currently   EKG    Afib 109 bpm, QRS 479 ms   Labs   Basic Metabolic Panel: Recent Labs  Lab 12/12/23 1234 12/12/23 1831 12/13/23 0643  NA 138  --  134*  K 3.4*  --  5.9*  CL 97*  --  100  CO2 27  --  20*  GLUCOSE 143*  --  100*  BUN 15  --  21  CREATININE 1.09  --  1.86*  CALCIUM  9.8  --  9.5  MG  --  2.0  --     Liver Function Tests: Recent Labs  Lab 12/12/23 1234  AST 31  ALT 13  ALKPHOS 107  BILITOT 1.5*  PROT 8.8*  ALBUMIN 4.3   Recent Labs  Lab 12/12/23 1234  LIPASE 16   No results for input(s): AMMONIA in the last 168 hours.  CBC: Recent Labs  Lab 12/12/23 1234 12/13/23 0643  WBC 7.2 8.6  NEUTROABS 4.6  --   HGB 14.8 15.4  HCT 48.9 51.5  MCV 81.9 82.1  PLT 314 341    Cardiac Enzymes: No results for input(s): CKTOTAL, CKMB, CKMBINDEX, TROPONINI in the last 168 hours.  BNP: BNP (last 3 results) Recent Labs    04/23/23 1840 05/28/23 0322 05/29/23 0413  BNP 253.0* 557.0* 672.0*    ProBNP (last 3 results) Recent Labs    12/12/23 1234  PROBNP 5,740.0*     CBG: Recent Labs  Lab 12/12/23 2146 12/13/23 1140  GLUCAP 142* 128*    Coagulation Studies: No results for input(s): LABPROT, INR in the last 72 hours.   Imaging   DG Chest 2 View Result Date: 12/12/2023 CLINICAL DATA:  Shortness of breath. Nausea and vomiting for the past 2 days. EXAM: CHEST - 2 VIEW COMPARISON:  05/29/2023 FINDINGS: Stable  enlarged cardiac silhouette, post CABG changes and right subclavian pacer and AICD leads. Clear lungs with normal vascularity. Tortuous and partially calcified thoracic aorta. Thoracolumbar spine degenerative changes. IMPRESSION: 1. No acute  abnormality. 2. Stable cardiomegaly. Electronically Signed   By: Elspeth Bathe M.D.   On: 12/12/2023 13:54   CT ABDOMEN PELVIS WO CONTRAST Result Date: 12/12/2023 CLINICAL DATA:  Nausea and decreased urination. EXAM: CT ABDOMEN AND PELVIS WITHOUT CONTRAST TECHNIQUE: Multidetector CT imaging of the abdomen and pelvis was performed following the standard protocol without IV contrast. RADIATION DOSE REDUCTION: This exam was performed according to the departmental dose-optimization program which includes automated exposure control, adjustment of the mA and/or kV according to patient size and/or use of iterative reconstruction technique. COMPARISON:  CT pelvis dated 05/30/2023 FINDINGS: Evaluation of this exam is limited in the absence of intravenous contrast. Lower chest: The visualized lung bases are clear. Pacemaker wires noted. No intra-abdominal free air or free fluid. Hepatobiliary: There is mild irregularity of the liver contour most consistent with changes of cirrhosis. Clinical correlation recommended. No biliary ductal dilatation. The gallbladder is unremarkable. Pancreas: Unremarkable. No pancreatic ductal dilatation or surrounding inflammatory changes. Spleen: Normal in size without focal abnormality. Adrenals/Urinary Tract: The adrenal glands unremarkable. There is no hydronephrosis or nephrolithiasis on either side. The visualized ureters and urinary bladder appear unremarkable. Stomach/Bowel: Postsurgical changes of bowel with anastomotic staple line in the rectum. There is sigmoid diverticulosis. There is no bowel obstruction or active inflammation. The appendix is normal. Vascular/Lymphatic: Moderate aortoiliac atherosclerotic disease. The IVC is unremarkable. No portal venous gas. There is no adenopathy. Reproductive: The prostate and seminal vesicles are grossly remarkable. Other: None Musculoskeletal: Degenerative changes of the spine. No acute osseous pathology. IMPRESSION: 1. No acute  intra-abdominal or pelvic pathology. No hydronephrosis or nephrolithiasis. 2. Sigmoid diverticulosis. No bowel obstruction. Normal appendix. 3.  Aortic Atherosclerosis (ICD10-I70.0). Electronically Signed   By: Vanetta Chou M.D.   On: 12/12/2023 13:23     Medications:     Current Medications:  apixaban   5 mg Oral BID   Chlorhexidine  Gluconate Cloth  6 each Topical Daily   citalopram   20 mg Oral Daily   ezetimibe   10 mg Oral Daily   fluticasone   1 spray Each Nare Daily   fluticasone  furoate-vilanterol  1 puff Inhalation Daily   metoprolol  succinate  12.5 mg Oral Daily   pantoprazole   20 mg Oral Daily   rosuvastatin   40 mg Oral Daily    Infusions:  amiodarone 30 mg/hr (12/13/23 0800)      Patient Profile   68 y.o. male with a hx of chronic systolic heart failure (EF 10-15% in 05/2017, 35-40% on last echo 4/25), VT s/p ICD placement 07/2017, CAD s/p PCI (1996) and 4v CABG (2002) with Cath 2019 at [facility] 4/4 patent bypass grafts, HTN, HLD, permanent atrial fibrillation on apixaban  transferred from [facility] for a/c systolic heart failure and questionable VT.   Assessment/Plan   1. ? VT - rates 130s, treated w/ ATP. ? True VT vs Afib w/ RVR - EP will review device interrogation and will advise - Initial Mg 2.0, K 3.4>>5.9 (obtain STAT BMP). Keep K ~4.0 and Mg >2.0  - continue amio gtt  - HS trop not c/w ACS  - Echo pending   2. Acute on Chronic Systolic Heart Failure - d/t ICM EF 10-15% in 05/2017, ~20% on echo 4/24 - Volume overloaded, BNP 5900. Optivol suggest ongoing fluid accumulation since July  - Scr up w/  attempts at diuresis, 1.09>>1.86. BP soft. Place PICC to assess co-ox to r/o low output. Check lactate  - may need to hold next dose of Toprol  XL if low output  - repeat echo  - additional GDMT currently limited by AKI and soft BP   3. Hypo/Hyperkalemia - Initial K 3.4>>KCl supp>>5.9>>Lokelma  - obtain f/u BMP now   4. CAD  - h/o PCI + CABG in 2002 - last LHC  @ [facility]  2019 showed 4/4 patent graft  - HS trop not c/w ACS but given ? VT may benefit from repeat LHC if renal fx stabilizes   5. AKI - Scr 1.09>>1.86 after dose of IV Lasix  - need to r/o low output, see above   6. Afib  - chronic, RVR this admit - continue amio gtt  - on Eliquis , may need switch to heparin  if invasive procedures planned   Length of Stay: 1  Brittainy Simmons, PA-C  12/13/2023, 11:55 AM   Advanced Heart Failure Team Pager [phone] (M-F; 7a - 5p)  Please contact [facility] for night-coverage after hours (4p -7a ) and weekends on [email or web address]  Patient seen with PA, I formulated the plan and agree with the above note.   Patient with history of ischemic CMP s/p CABG, permanent AF, MDT ICD, poor mobility.  Historically, his EF has been in the 20-25% range since at least 2015.  He had an echo done in 4/25 with EF estimated at 35-40% but this was a technically difficult study.  I reviewed echo this admission, EF appears to be in the 25% range with moderate RV dysfunction.  Patient was admitted after about 4 days of nausea, vomiting, abdominal pain.  Abdomen/pelvis CT was unremarkable.  BNP elevated 5740, LFTs normal, troponin minimally elevated with no trend.  He is in permanent AF, but had a run of VT since arrival here terminated by ATP.  Interestingly, Optivol reading suggests low volume the last few days.  He was given IV Lasix  earlier today but has had minimal UOP.  Creatinine jumped from 1.09 => 1.86, K elevated at 5.9.   General: NAD, obese.  Neck: Thick, JVP difficult but may be elevated, no thyromegaly or thyroid nodule.  Lungs: Clear to auscultation bilaterally with normal respiratory effort. CV: Nondisplaced PMI.  Heart regular S1/S2, no S3/S4, no murmur.  1+ ankle edema.  No carotid bruit.  Normal pedal pulses.  Abdomen: Soft, mild diffuse tenderness, no hepatosplenomegaly, no distention.  Skin: Intact without lesions or rashes.  Neurologic: Alert and oriented x 3.   Psych: Normal affect. Extremities: No clubbing or cyanosis.  HEENT: Normal.   1. ?Acute on chronic systolic CHF: Ischemic cardiomyopathy.  [employer] ICD.  He has been on minimal GDMT at home.  Patient has a long-standing cardiomyopathy, echoes with EF in the 20-25% range since at least 2015.  By my review of today's echo, I would call EF 25% with moderate RV dysfunction and dilated IVC.  Optivol check on device does not suggest volume overload and he has not responded well to IV Lasix  with significant jump in creatinine, also reports nausea/vomiting/abdominal pain and poor po intake for several days now. However, IVC dilated on echo and BNP high.  Exam is difficult with thick neck.   - Check lactate.  - Given poor response to IV Lasix , I am going to place PICC and check CVP/send co-ox before giving more Lasix .  If co-ox is suggestive of low output, would start milrinone.  - He will  continue low dose Toprol  XL (home med).  2. VT: Patient has permanent AF but was noted to have a run of VT last night treated with ATP (dual tachycardia).  He is now on amiodarone gtt and in atrial fibrillation. - Continue amiodarone gtt for now.  3. Atrial fibrillation: Permanent.  He is currently rate-controlled.  - Continue apixaban .  4. AKI: Creatinine jumped 1.09 => 1.86 with elevated K.  He received Lokelma.  ?Cardiorenal.  - As above, placing PICC to confirm volume status and cardiac output.  - Repeat BMET now post-Lokelma. 5. CAD: H/o CABG 2002.  Last cath in 2013 with occluded LAD and RCA, 2 occluded distal OMs.  No chest pain.  Troponin minimally elevated with no trend, suspect demand ischemia.  - On apixaban  with stable CAD so no ASA.  - Continue statin.   CRITICAL CARE Performed by: Ezra Shuck  Total critical care time: 70 minutes  Critical care time was exclusive of separately billable procedures and treating other patients.  Critical care was necessary to treat or prevent imminent or  life-threatening deterioration.  Critical care was time spent personally by me on the following activities: development of treatment plan with patient and/or surrogate as well as nursing, discussions with consultants, evaluation of patient's response to treatment, examination of patient, obtaining history from patient or surrogate, ordering and performing treatments and interventions, ordering and review of laboratory studies, ordering and review of radiographic studies, pulse oximetry and re-evaluation of patient's condition.  Ezra Shuck 12/13/2023 3:04 PM  CVL placed, co-ox good at 73%, lactate normal.  CVP 12-13 on my read.  Creatinine 2.0 with K 5.1 (down from 5.9 with Lokelma).  This does not look like low output HF.  He is mildly volume overloaded but with rise in creatinine, I will not give additional Lasix  today and will let renal function equilibrate.  I will reassess in morning.   Ezra Shuck 12/13/2023 4:27 PM

## 2023-12-13 NOTE — Plan of Care (Signed)
  Problem: Clinical Measurements: Goal: Diagnostic test results will improve Outcome: Progressing   Problem: Coping: Goal: Level of anxiety will decrease Outcome: Progressing   Problem: Elimination: Goal: Will not experience complications related to urinary retention Outcome: Progressing   Problem: Pain Managment: Goal: General experience of comfort will improve and/or be controlled Outcome: Progressing

## 2023-12-13 NOTE — Progress Notes (Signed)
 PROGRESS NOTE  Douglas [name]  DOB: [birth year]  PCP: Mancil Pfeiffer, [place] FMW:[id number]  DOA: 12/12/2023  LOS: 1 day  Hospital Day: 2  Subjective: Patient was seen and examined this morning. Was walking to the bathroom.  Not in distress.  No family at bedside. Overnight, afebrile, remains tachycardic to 100s and 110s, blood pressure normal range, requiring 3 L oxygen. Repeat labs this morning with sodium 134, potassium 5.9, BUN/creatinine 21/1.6, Troponin elevated to 53  Brief narrative: Douglas [name] is a 68 y.o. male with PMH significant for obesity, DM2, HTN, HLD, A-fib on Eliquis , CAD, ischemic cardiopathy, CHF s/p AICD, anemia, asthma, opiate dependence 10/16, patient presented to ED at [facility] with complaint of 2 days of nausea, vomiting, abdominal pain, inability to urinate.  In the ED, patient is afebrile, heart rate more than 110, blood pressure in 150s, O2 sat 86% on room air, required 4 L oxygen for O2 sat over 90% Labs with proBNP elevated to over 5700, renal function normal, WBC count normal. RVP negative Chest x-ray unremarkable. Initial EKG with A-fib at 96 bpm CT abdomen and pelvis did not show any acute intra-abdominal or pelvic pathology, hydronephrosis or nephrolithiasis.  Ureters and urinary bladder unremarkable.  Sigmoid diverticulosis Admitted to [facility]  While in the ED, telemetry showed episodes of sustained V. tach but converted to A-fib spontaneously. Patient remained hemodynamically stable. Cardiology was consulted and recommended initiation of amiodarone drip, interrogation of pacemaker, diuresis and transferred to [facility]. Patient was given 1 dose of Lasix .  Assessment and plan: Acute exacerbation of chronic systolic CHF Hypertension Acute respiratory failure with hypoxia Presented with abnormal symptoms and respiratory failure  proBNP was elevated to over 5700.  Per cardiology note, Optivol suggests ongoing fluid accumulation  since July Most recent echo from April 2024 with EF 35 to 40%.  Pending repeat echo PTA meds- Toprol  25 mg daily, Jardiance  10 mg daily, Aldactone , torsemide  20 mg daily Currently on Toprol  12.5 mg daily Creatinine went up to 1.86 today.  Received 2 doses of Lasix  40 mg so far, last on this morning. Currently requiring 5 L oxygen. Continue to monitor for daily intake output, weight, blood pressure, BNP, renal function and electrolytes. Net IO Since Admission: 674.47 mL [12/13/23 1524] Recent Labs  Lab 12/12/23 1234 12/12/23 1831 12/13/23 0643  PROBNP 5,740.0*  --   --   BUN 15  --  21  CREATININE 1.09  --  1.86*  NA 138  --  134*  K 3.4*  --  5.9*  MG  --  2.0  --    A-fib with RVR  new Ventricular tachycardia- Initial EKG with A-fib with mild tachycardia. Continued to have mild tachycardia overnight. Had 2 minutes of ventricular tachycardia in the ED and converted to A-fib again Patient was unaware. Per cardiology recommendation was started on amiodarone drip Continue metoprolol , dose adjustment as above Chronically anticoagulated with Eliquis   Elevated troponin H/o CAD, HLD Troponin elevated to 53 this morning.  Likely demand ischemia in the setting of RVR and CHF exacerbation Continue Toprol , Eliquis , Zetia , crestor  Recent Labs    12/13/23 0643  TROPONINIHS 53*   AKI Noted rising creatinine from normal to 1.86 today. Likely secondary to diuresis.  Last dose of Lasix  was this morning.  I would hold off further diuresis Recent Labs    04/23/23 2001 04/24/23 9564 04/25/23 0350 05/28/23 0322 05/29/23 0413 05/30/23 0405 05/31/23 0410 11/21/23 0933 12/12/23 1234 12/13/23 0643  BUN 44*  37* 21 24* 21 23 23 17 15 21   CREATININE 2.36* 1.70* 1.09 1.23 0.92 0.99 1.17 1.91* 1.09 1.86*  CO2 24 25 25 23 28 25 25 23 27  20*   Hyponatremia Mild.  Continue to monitor Recent Labs  Lab 12/12/23 1234 12/13/23 0643  NA 138 134*   Hyperkalemia Potassium was initially low  at 3.4.  Elevated to 5.9 today likely due to acute renal failure. I ordered for 1 dose of Lokelma. Recent Labs  Lab 12/12/23 1234 12/12/23 1831 12/13/23 0643  K 3.4*  --  5.9*  MG  --  2.0  --    Abdominal pain Primary patient with lower abdominal pain, difficulty with voiding.   CT abdomen and pelvis without contrast negative for acute abnormality.  Urinary bladder, ureters kidneys without signs of obstruction.    Type 2 diabetes mellitus A1c 5.9 on April 2025 PTA meds-Jardiance  10 mg daily Continue SSI/Accu-Cheks Recent Labs  Lab 12/12/23 2146 12/13/23 1140  GLUCAP 142* 128*   Chronic low back pain Chronic opiate dependence Report multiple falls  PT eval ordered PTA meds- Percocet 10/325 every 6 hours as needed continue the same  Anxiety/depression PTA meds-Celexa  20 mg daily, Xanax  1 mg twice daily as needed Currently on same  Obesity 2 Body mass index is 36.1 kg/m. Patient has been advised to make an attempt to improve diet and exercise patterns to aid in weight loss.    Mobility:  PT Orders: Active   PT Follow up Rec:    Goals of care   Code Status: Full Code     DVT prophylaxis:   apixaban  (ELIQUIS ) tablet 5 mg   Antimicrobials: None Fluid: None Consultants: Cardiology Family Communication: None at bedside  Status: Inpatient Level of care:  ICU   Patient is from: Home Needs to continue in-hospital care: Ongoing workup Anticipated d/c to: Hopefully home tomorrow pending PT      Diet:  Diet Order             Diet regular Room service appropriate? Yes; Fluid consistency: Thin  Diet effective now                   Scheduled Meds:  apixaban   5 mg Oral BID   Chlorhexidine  Gluconate Cloth  6 each Topical Daily   citalopram   20 mg Oral Daily   ezetimibe   10 mg Oral Daily   fluticasone   1 spray Each Nare Daily   fluticasone  furoate-vilanterol  1 puff Inhalation Daily   metoprolol  succinate  12.5 mg Oral Daily   pantoprazole   20  mg Oral Daily   rosuvastatin   40 mg Oral Daily    PRN meds: acetaminophen  **OR** acetaminophen , albuterol , artificial tears, ondansetron  **OR** ondansetron  (ZOFRAN ) IV, mouth rinse, oxyCODONE -acetaminophen  **AND** oxyCODONE , polyethylene glycol, sodium chloride , tiZANidine    Infusions:   amiodarone 30 mg/hr (12/13/23 1400)   magnesium  sulfate bolus IVPB      Antimicrobials: Anti-infectives (From admission, onward)    None       Objective: Vitals:   12/13/23 1300 12/13/23 1400  BP: (!) 98/54 111/86  Pulse: (!) 104 (!) 106  Resp: 16 (!) 22  Temp:    SpO2: 92% 93%    Intake/Output Summary (Last 24 hours) at 12/13/2023 1524 Last data filed at 12/13/2023 1400 Gross per 24 hour  Intake 674.47 ml  Output --  Net 674.47 ml   Filed Weights   12/12/23 1131 12/12/23 2130 12/12/23 2145  Weight: 130 kg 110.9  kg 110.9 kg   Weight change:  Body mass index is 36.1 kg/m.   Physical Exam: General exam: Pleasant, elderly. Skin: No rashes, lesions or ulcers. HEENT: Atraumatic, normocephalic, no obvious bleeding Lungs: Clear to auscultation bilaterally,  CVS: Mild irregular tachycardia no murmur,   GI/Abd: Soft, nontender, nondistended, bowel sound present,   CNS: Alert, awake, oriented to place Psychiatry: Mood appropriate Extremities: No pedal edema, no calf tenderness,   Data Review: I have personally reviewed the laboratory data and studies available.  F/u labs ordered Unresulted Labs (From admission, onward)     Start     Ordered   12/14/23 0500  Basic metabolic panel with GFR  Daily,   R     Question:  Specimen collection method  Answer:  Lab=Lab collect   12/13/23 1336   12/14/23 0500  Magnesium   Tomorrow morning,   R       Question:  Specimen collection method  Answer:  Lab=Lab collect   12/13/23 1336   12/13/23 1454  Magnesium   ONCE - STAT,   STAT       Question:  Specimen collection method  Answer:  Lab=Lab collect   12/13/23 1453   12/13/23 1453  Basic  metabolic panel with GFR  ONCE - STAT,   STAT       Question:  Specimen collection method  Answer:  Lab=Lab collect   12/13/23 1453   12/13/23 1425  Lactic acid, plasma  (Lactic Acid)  STAT Now then every 3 hours,   R     Question:  Specimen collection method  Answer:  Lab=Lab collect   12/13/23 1424   12/13/23 1425  Cooxemetry Panel (carboxy, met, total hgb, O2 sat)  Once,   R       Question:  Specimen collection method  Answer:  Lab=Lab collect   12/13/23 1424   12/12/23 1158  Urinalysis, Routine w reflex microscopic -Urine, Clean Catch  Once,   R       Question:  Specimen Source  Answer:  Urine, Clean Catch   12/12/23 1158            Signed, Chapman Rota, MD [facility] 12/13/2023

## 2023-12-13 NOTE — TOC Initial Note (Signed)
 Transition of Care [facility]) - Initial/Assessment Note    Patient Details  Name: Douglas [name] MRN: [id number] Date of Birth: [birth year]  Transition of [facility] CM/SW Contact:    Justina Delcia Czar, RN Phone Number: [phone] 12/13/2023, 5:23 PM  Clinical Narrative:                 Spoke to pt and states he lives alone. States he has RW and cane at home. Gave permission to speak to Izetta Charles, for Department of Social Services.   Will need PT/OT evaluation and recommendation. Pt may need SNF rehab. Lives alone.   Expected Discharge Plan: Skilled Nursing Facility Barriers to Discharge: Continued Medical Work up   Patient Goals and CMS Choice Patient states their goals for this hospitalization and ongoing recovery are:: wants to get better    Expected Discharge Plan and Services   Discharge Planning Services: CM Consult Post Acute Care Choice: Skilled Nursing Facility Living arrangements for the past 2 months: Single Family Home                   Prior Living Arrangements/Services Living arrangements for the past 2 months: Single Family Home Lives with:: Self Patient language and need for interpreter reviewed:: Yes Do you feel safe going back to the place where you live?: Yes      Need for Family Participation in Patient Care: Yes (Comment) Care giver support system in place?: Yes (comment) Current home services: DME ([facility]) Criminal Activity/Legal Involvement Pertinent to Current Situation/Hospitalization: No - Comment as needed  Activities of Daily Living   ADL Screening (condition at time of admission) Independently performs ADLs?: Yes (appropriate for developmental age) Is the patient deaf or have difficulty hearing?: No Does the patient have difficulty seeing, even when wearing glasses/contacts?: No Does the patient have difficulty concentrating, remembering, or making decisions?: No  Permission Sought/Granted Permission sought to share  information with : Case Manager, Family Supports, PCP Permission granted to share information with : Yes, Verbal Permission Granted  Share Information with NAME: Izetta Charles  Permission granted to share info w AGENCY: SNF, PCP, DME  Permission granted to share info w Relationship: friend  Permission granted to share info w Contact Information: [phone]  Emotional Assessment Appearance:: Appears stated age Attitude/Demeanor/Rapport: Engaged Affect (typically observed): Accepting Orientation: : Oriented to Self, Oriented to Place, Oriented to  Time, Oriented to Situation   Psych Involvement: No (comment)  Admission diagnosis:  Nausea [R11.0] Ventricular tachycardia (HCC) [I47.20] Hypoxia [R09.02] Acute hypoxemic respiratory failure (HCC) [J96.01] Acute on chronic congestive heart failure, unspecified heart failure type [facility]) [I50.9] Patient Active Problem List   Diagnosis Date Noted   Acute hypoxemic respiratory failure (HCC) 12/12/2023   Ventricular tachycardia (HCC) 12/12/2023   Frequent falls 11/14/2023   Acquired thrombophilia 05/28/2023   Prolonged QT interval 04/23/2023   Chronic systolic CHF (congestive heart failure) (HCC) 04/23/2023   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 06/09/2020   Moderate persistent asthma without complication 06/09/2020   Stage 3a chronic kidney disease (HCC) 06/09/2019   Biventricular failure (HCC) 06/10/2017   Hyperosmolarity syndrome 06/10/2017   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   Coronary artery disease 06/02/2017   Diverticulosis 06/02/2017   Vertebral compression fracture (HCC) 06/02/2017   Spondylosis of cervical spine 06/02/2017   Coagulopathy 06/02/2017   Allergic rhinitis 12/27/2014   Chronic low back pain 12/27/2014   Gastroesophageal reflux disease without esophagitis 12/27/2014  Generalized anxiety disorder 12/27/2014   Hyperlipidemia 12/27/2014   DJD (degenerative joint disease), lumbosacral  08/26/2013   Atrial fibrillation, chronic (HCC) 08/25/2013   History of Substance abuse 08/25/2013   Essential hypertension 05/01/2010   Cardiomyopathy, ischemic 05/01/2010   ICD (implantable cardioverter-defibrillator) in place 05/01/2010   PCP:  Grooms, Zeigler, PA-C Pharmacy:   [facility] [place], [place] - [address] [place] [address] Phone: [phone] Fax: [phone]     Social Drivers of Health (SDOH) Social History: SDOH Screenings   Food Insecurity: No Food Insecurity (12/13/2023)  Housing: Low Risk  (12/13/2023)  Transportation Needs: No Transportation Needs (12/13/2023)  Utilities: Not At Risk (12/13/2023)  Depression (PHQ2-9): Medium Risk (11/14/2023)  Social Connections: Moderately Isolated (12/13/2023)  Tobacco Use: Low Risk  (12/13/2023)   SDOH Interventions:     Readmission Risk Interventions    05/31/2023   11:35 AM 05/30/2023   10:20 AM  Readmission Risk Prevention Plan  Transportation Screening Complete Complete  Home Care Screening Complete Complete  Medication Review (RN CM) Complete Complete

## 2023-12-13 NOTE — Progress Notes (Signed)
Heart Failure Navigator Progress Note  Assessed for Heart & Vascular TOC clinic readiness.  Patient does not meet criteria due to Advanced Heart Failure team consulted. .   Navigator will sign off at this time.   Rhae Hammock, BSN, Scientist, clinical (histocompatibility and immunogenetics) Only

## 2023-12-13 NOTE — Procedures (Signed)
 Central Venous Catheter Insertion Procedure Note  Douglas [name]  [id number]  [birth year]  Date:12/13/23  Time:4:04 PM   Provider Performing:Pete FORBES Jenna   Procedure: Insertion of Non-tunneled Central Venous [phone]) with US  guidance ([id number])   Indication(s) Medication administration, central venous monitoring and SCVO2 trending to treat cardiogenic shock   Consent Risks of the procedure as well as the alternatives and risks of each were explained to the patient and/or caregiver.  Consent for the procedure was obtained and is signed in the bedside chart  Anesthesia Topical only with 1% lidocaine    Timeout Verified patient identification, verified procedure, site/side was marked, verified correct patient position, special equipment/implants available, medications/allergies/relevant history reviewed, required imaging and test results available.  Sterile Technique Maximal sterile technique including full sterile barrier drape, hand hygiene, sterile gown, sterile gloves, mask, hair covering, sterile ultrasound probe cover (if used).  Procedure Description Area of catheter insertion was cleaned with chlorhexidine  and draped in sterile fashion.  With real-time ultrasound guidance a central venous catheter was placed into the left internal jugular vein. Nonpulsatile blood flow and easy flushing noted in all ports.  The catheter was sutured in place and sterile dressing applied.  Complications/Tolerance None; patient tolerated the procedure well. Chest X-ray is ordered to verify placement for internal jugular or subclavian cannulation.   Chest x-ray is not ordered for femoral cannulation.  EBL Minimal  Specimen(s) None

## 2023-12-13 NOTE — Evaluation (Signed)
 Physical Therapy Evaluation Patient Details Name: Douglas [name] MRN: [id number] DOB: Dec 03, 1955 Today's Date: 12/13/2023  History of Present Illness  Douglas [name] is a 68 y.o. male admitted 10/16 with abdominal pain, N/V.  Had VTach in ED and code called.  Afib as well.  PMH significant for obesity, DM2, HTN, HLD, A-fib on Eliquis , CAD, ischemic cardiopathy, CHF s/p AICD, anemia, asthma, opiate dependence  Clinical Impression  Pt admitted with above diagnosis. Pt able to ambulate with Elyn walker with min assist and cues needing a seated rest break due to fatigue after 100 feet. Pt should progress well and be able to go home with HHPT f/u. Has some family support and was independent PTA.  HAs RW. Will follow acutely.  Pt currently with functional limitations due to the deficits listed below (see PT Problem List). Pt will benefit from acute skilled PT to increase their independence and safety with mobility to allow discharge.           If plan is discharge home, recommend the following: Assistance with cooking/housework;Assist for transportation;Help with stairs or ramp for entrance   Can travel by private vehicle        Equipment Recommendations None recommended by PT  Recommendations for Other Services       Functional Status Assessment Patient has had a recent decline in their functional status and demonstrates the ability to make significant improvements in function in a reasonable and predictable amount of time.     Precautions / Restrictions Precautions Precautions: Fall Restrictions Weight Bearing Restrictions Per Provider Order: No      Mobility  Bed Mobility Overal bed mobility: Independent                  Transfers Overall transfer level: Needs assistance   Transfers: Sit to/from Stand Sit to Stand: Min assist, +2 safety/equipment           General transfer comment: Pt needed cues for hand placement but can power up without assist.     Ambulation/Gait Ambulation/Gait assistance: Min assist, +2 safety/equipment Gait Distance (Feet): 100 Feet Assistive device: Elyn Finder Gait Pattern/deviations: Step-through pattern, Decreased stride length, Drifts right/left   Gait velocity interpretation: <1.31 ft/sec, indicative of household ambulator   General Gait Details: Pt was able to ambulate a short distance with Elyn walker with min assist of 2 for safety. Pt  needed 5LO2 to keep sats >88%.  Other VSS  Stairs            Wheelchair Mobility     Tilt Bed    Modified Rankin (Stroke Patients Only)       Balance Overall balance assessment: Needs assistance Sitting-balance support: No upper extremity supported, Feet supported Sitting balance-Leahy Scale: Fair     Standing balance support: Bilateral upper extremity supported, During functional activity, Reliant on assistive device for balance Standing balance-Leahy Scale: Poor Standing balance comment: relies on EVa walker                             Pertinent Vitals/Pain Pain Assessment Pain Assessment: 0-10 Pain Score: 4  Faces Pain Scale: Hurts little more Pain Location: back Pain Descriptors / Indicators: Aching, Grimacing, Guarding Pain Intervention(s): Limited activity within patient's tolerance, Monitored during session, Repositioned    Home Living Family/patient expects to be discharged to:: Private residence Living Arrangements: Alone Available Help at Discharge: Friend(s);Available PRN/intermittently Type of Home: House Home Access: Stairs to enter  Entrance Stairs-Rails: Can reach both Entrance Stairs-Number of Steps: 3-4 steps   Home Layout: One level Home Equipment: Cane - single Librarian, academic (2 wheels) Additional Comments: No falls recently per pt    Prior Function Prior Level of Function : Independent/Modified Independent             Mobility Comments: ambulator without ADin house, drives, shops and uses RW in  community ADLs Comments: I B/D     Extremity/Trunk Assessment   Upper Extremity Assessment Upper Extremity Assessment: Defer to OT evaluation    Lower Extremity Assessment Lower Extremity Assessment: Generalized weakness    Cervical / Trunk Assessment Cervical / Trunk Assessment: Normal  Communication   Communication Communication: No apparent difficulties    Cognition Arousal: Alert Behavior During Therapy: WFL for tasks assessed/performed   PT - Cognitive impairments: No apparent impairments                         Following commands: Intact       Cueing       General Comments      Exercises General Exercises - Lower Extremity Ankle Circles/Pumps: AROM, Both, 10 reps, Supine Long Arc Quad: AROM, Both, 10 reps, Seated Hip Flexion/Marching: AROM, Both, 10 reps, Seated   Assessment/Plan    PT Assessment Patient needs continued PT services  PT Problem List Decreased activity tolerance;Decreased balance;Decreased mobility;Decreased knowledge of use of DME;Decreased safety awareness;Decreased knowledge of precautions;Cardiopulmonary status limiting activity       PT Treatment Interventions Gait training;DME instruction;Functional mobility training;Therapeutic activities;Therapeutic exercise;Balance training;Patient/family education    PT Goals (Current goals can be found in the Care Plan section)  Acute Rehab PT Goals Patient Stated Goal: to go home PT Goal Formulation: With patient Time For Goal Achievement: 12/27/23 Potential to Achieve Goals: Good    Frequency Min 2X/week     Co-evaluation               AM-PAC PT 6 Clicks Mobility  Outcome Measure Help needed turning from your back to your side while in a flat bed without using bedrails?: A Little Help needed moving from lying on your back to sitting on the side of a flat bed without using bedrails?: A Little Help needed moving to and from a bed to a chair (including a wheelchair)?:  A Little Help needed standing up from a chair using your arms (e.g., wheelchair or bedside chair)?: A Little Help needed to walk in hospital room?: A Little Help needed climbing 3-5 steps with a railing? : A Lot 6 Click Score: 17    End of Session Equipment Utilized During Treatment: Gait belt;Oxygen Activity Tolerance: Patient limited by fatigue Patient left: in chair;with call bell/phone within reach;with chair alarm set Nurse Communication: Mobility status PT Visit Diagnosis: Muscle weakness (generalized) (M62.81)    Time: 1027-1050 PT Time Calculation (min) (ACUTE ONLY): 23 min   Charges:   PT Evaluation $PT Eval Moderate Complexity: 1 Mod PT Treatments $Gait Training: 8-22 mins PT General Charges $$ ACUTE PT VISIT: 1 Visit         Merced Hanners M,PT Acute Rehab Services [phone]   Douglas [name] 12/13/2023, 3:47 PM

## 2023-12-13 NOTE — Progress Notes (Signed)
  Echocardiogram 2D Echocardiogram has been performed.  Koleen KANDICE Popper, RDCS 12/13/2023, 9:46 AM

## 2023-12-13 NOTE — Consult Note (Signed)
 ELECTROPHYSIOLOGY CONSULT NOTE    Patient ID: Douglas [name] MRN: [id number], DOB/AGE: 07/26/1955 68 y.o.  Admit date: 12/12/2023 Date of Consult: 12/13/2023  Primary Physician: Mancil Pfeiffer, [place] Primary Cardiologist: Danelle Birmingham, MD  Electrophysiologist: Dr. Birmingham   Referring Provider: Dr. Arlice  Patient Profile: Douglas [name] is a 68 y.o. male with a history of permanent AF, HFrEF s/p ICD (LVEF 35 to 40% in 05/2023), VT, CAD s/p CABG, HLD, DM, anemia, asthma, opioid/benzo dependence, & history of poor medication compliance who is being seen today for the evaluation of VT s/p ATP at the request of Dr. Rolan.  HPI:  Douglas [name] is a 68 y.o. male who presented to [facility] ER on 12/12/23 with reports 3-4 days of nausea, weakness, lower abdominal pain, inability to urinate or have BM.    Patient reports he began having multiple falls in the recent weeks.  He has been using a walker due to this change.  His physician in [facility] reduced his metoprolol  from 200 mg daily to 12.5 mg daily in the last 2 to 3 weeks before admission.  He reports he was having difficulty with standing up and becoming lightheaded.  In addition it sounds like there was some confusion with his pain contract and he was unable to get his Xanax  prescription filled.    He was admitted per [facility] for further evaluation.  Initial evaluation suggestive of hypoxemic respiratory failure with volume overload in the setting of decompensated CHF.  Infectious workup was negative for COVID, influenza and RSV screening.  His proBNP was elevated at 5740.  CT of the abdomen and pelvis was negative.  He was diuresed.  Device interrogation showed known A-fib and at least 1 episode of VT with a cycle length of 300-310 ms s/p ATP but no high-voltage therapy.  ATP x 1 for VT (VT episode #89) episode lasting 12 seconds. OptiVol suggestive of fluid accumulation since July.  His creatinine rose with diuresis attempts.  The patient  was transferred to [facility] ICU for advanced heart failure evaluation.   He denies chest pain, palpitations, dyspnea, PND, orthopnea, nausea, vomiting, dizziness, syncope, edema, weight gain, or early satiety.   Labs Potassium5.9* (10/17 9356) Magnesium   2.0 (10/16 1831) Creatinine, ser  1.86* (10/17 9356) PLT  341 (10/17 0643) HGB  15.4 (10/17 0643) WBC 8.6 (10/17 0643) Troponin I (High Sensitivity)53* (10/17 9356).    Past Medical History:  Diagnosis Date   A-fib [facility])    AICD (automatic cardioverter/defibrillator) present    Anemia    Asthma    CAD (coronary artery disease)    CHF (congestive heart failure) (HCC)    Diabetes mellitus without complication (HCC)    Dyslipidemia    Elevated cholesterol    Hypertension      Surgical History:  Past Surgical History:  Procedure Laterality Date   CARDIAC CATHETERIZATION  1996   stent placement   CARDIAC DEFIBRILLATOR PLACEMENT     CORONARY ARTERY BYPASS GRAFT  2002   times 4   CORONARY ARTERY BYPASS GRAFT     EYE SURGERY     ICD GENERATOR CHANGEOUT N/A 09/30/2018   Procedure: ICD GENERATOR CHANGEOUT;  Surgeon: Birmingham Danelle ORN, MD;  Location: [facility] INVASIVE CV LAB;  Service: Cardiovascular;  Laterality: N/A;   IMPLANTABLE CARDIOVERTER DEFIBRILLATOR (ICD) GENERATOR CHANGE N/A 09/10/2011   Procedure: ICD GENERATOR CHANGE;  Surgeon: Danelle ORN Birmingham, MD;  Location: [facility] CATH LAB;  Service: Cardiovascular;  Laterality: N/A;  INSERT / REPLACE / REMOVE PACEMAKER     LEFT HEART CATHETERIZATION WITH CORONARY ANGIOGRAM N/A 08/29/2011   Procedure: LEFT HEART CATHETERIZATION WITH CORONARY ANGIOGRAM;  Surgeon: Peter M Swaziland, MD;  Location: [facility] CATH LAB;  Service: Cardiovascular;  Laterality: N/A;     Medications Prior to Admission  Medication Sig Dispense Refill Last Dose/Taking   albuterol  (VENTOLIN  HFA) 108 (90 Base) MCG/ACT inhaler Inhale 2 puffs into the lungs every 6 (six) hours as needed for wheezing or shortness of breath. 8 g 1 Unknown    ALPRAZolam  (XANAX ) 1 MG tablet Take 1 tablet (1 mg total) by mouth at bedtime as needed for anxiety. (Patient taking differently: Take 1 mg by mouth 2 (two) times daily as needed for anxiety.) 30 tablet 0 Unknown   Carboxymethylcellulose Sodium (ARTIFICIAL TEARS OP) Place 1 drop into both eyes 3 (three) times daily.   12/11/2023 Bedtime   citalopram  (CELEXA ) 20 MG tablet Take 20 mg by mouth daily.   12/11/2023 Morning   ELIQUIS  5 MG TABS tablet TAKE ONE TABLET BY MOUTH TWICE DAILY 120 tablet 0 12/11/2023 Bedtime   ezetimibe  (ZETIA ) 10 MG tablet Take 10 mg by mouth daily.   12/11/2023 Morning   fluticasone  (FLONASE ) 50 MCG/ACT nasal spray Place 1 spray into both nostrils daily.   12/11/2023 Morning   fluticasone -salmeterol (ADVAIR) 250-50 MCG/ACT AEPB Inhale 1 puff into the lungs in the morning and at bedtime.   12/11/2023 Bedtime   JARDIANCE  10 MG TABS tablet Take 10 mg by mouth daily.   12/11/2023 Morning   lansoprazole (PREVACID) 30 MG capsule Take 30 mg by mouth daily.     12/11/2023 Bedtime   loratadine  (CLARITIN ) 10 MG tablet Take 10 mg by mouth daily.   Unknown   metoprolol  succinate (TOPROL -XL) 25 MG 24 hr tablet Take 0.5 tablets (12.5 mg total) by mouth daily. 45 tablet 1 12/11/2023 Morning   Omega-3 Fatty Acids (FISH OIL) 1200 MG CAPS Take 1,200 mg by mouth daily.   12/11/2023 Morning   oxyCODONE -acetaminophen  (PERCOCET) 10-325 MG tablet Take 1 tablet by mouth every 6 (six) hours as needed for pain (use medication sparingly for severe pain only). 30 tablet 0 12/11/2023 Noon   rosuvastatin  (CRESTOR ) 40 MG tablet TAKE ONE (1) TABLET EACH DAY 90 tablet 0 12/11/2023 Bedtime   tiZANidine  (ZANAFLEX ) 4 MG tablet Take 4 mg by mouth every 8 (eight) hours as needed for muscle spasms.   12/11/2023 Noon   torsemide  (DEMADEX ) 20 MG tablet Take 1 tablet (20 mg total) by mouth daily. 90 tablet 0 12/11/2023 Morning    Inpatient Medications:   apixaban   5 mg Oral BID   Chlorhexidine  Gluconate Cloth  6  each Topical Daily   citalopram   20 mg Oral Daily   ezetimibe   10 mg Oral Daily   fluticasone   1 spray Each Nare Daily   fluticasone  furoate-vilanterol  1 puff Inhalation Daily   metoprolol  succinate  12.5 mg Oral Daily   pantoprazole   20 mg Oral Daily   rosuvastatin   40 mg Oral Daily    Allergies:  Allergies  Allergen Reactions   Xarelto  [Rivaroxaban ] Other (See Comments)    Bleeding ulcers   Codeine Hives and Itching   Lasix  [Furosemide ] Other (See Comments)    Patient says he gets light headed and dizzy.    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  Heart- Irregularly irregular rate and rhythm, No M/G/R.  GI- Soft, NT, ND.  Extremities- No clubbing, cyanosis, or edema   Radiology/Studies: US  EKG SITE RITE Result Date: 12/13/2023 If Site Rite image not attached, placement could not be confirmed due to current cardiac rhythm.  ECHOCARDIOGRAM COMPLETE Result Date: 12/13/2023    ECHOCARDIOGRAM REPORT   Patient Name:   DESIREE FLEMING Date of Exam: 12/13/2023 Medical Rec #:  [id number]        Height:       69.0 in Accession #:    [id number]       Weight:       244.5 lb Date of Birth:  Nov 13, 1955        BSA:          2.250 m Patient Age:    67 years         BP:           94/78 mmHg Patient Gender: M                HR:           116 bpm. Exam Location:  Inpatient Procedure: 2D Echo, Color Doppler, Cardiac Doppler  and Intracardiac            Opacification Agent (Both Spectral and Color Flow Doppler were            utilized during procedure). Indications:    Ventricular Tachycardia I47.2  History:        Patient has prior history of Echocardiogram examinations, most                 recent 06/04/2023. CHF and Cardiomyopathy, CAD, Defibrillator and                 Pacemaker, Arrythmias:Atrial Fibrillation and Tachycardia; Risk                 Factors:Dyslipidemia and Hypertension.  Sonographer:    Koleen Popper RDCS Referring Phys: [id number] EJIROGHENE E EMOKPAE IMPRESSIONS  1. Left ventricular ejection fraction, by estimation, is 30 to 35%. The left ventricle has moderately decreased function. The left ventricle demonstrates regional wall motion abnormalities (see scoring diagram/findings for description). The left ventricular internal cavity size was moderately to severely dilated. Left ventricular diastolic parameters are indeterminate.  2. Right ventricular systolic function was not well visualized. The right ventricular size is not well visualized. There is normal pulmonary artery systolic pressure.  3. Left atrial size was mildly dilated.  4. The mitral valve is normal in structure. Mild mitral valve regurgitation. No evidence of mitral stenosis.  5. The aortic valve is tricuspid. Aortic valve regurgitation is not visualized. No aortic stenosis is present.  6. The inferior vena cava is dilated in size with <50% respiratory variability, suggesting right atrial pressure of 15 mmHg. Comparison(s): A prior study was performed on 05/28/2023.  Prior images reviewed side by side. The ejection fraction was 35-40%. On personal review there were similar wall motion abnormalities at that time with worse overall function today. The LV cavity is more dilated today (5.6 cm-> 6.4 cm at end diastole, 4.5 cm -> 5.7 cm at end systole). There was moderate biatrial enlargement. Conclusion(s)/Recommendation(s): Cannot exclude a small apical thrombus.  Consider a cardiac CT or MRI if clinically indicated. FINDINGS  Left Ventricle: Left ventricular ejection fraction, by estimation, is 30 to 35%. The left ventricle has moderately decreased function. The left ventricle demonstrates regional wall motion abnormalities. Definity  contrast agent was given IV to delineate the left ventricular endocardial borders. The left ventricular internal cavity size was moderately to severely dilated. There is no left ventricular hypertrophy. Left ventricular diastolic parameters are indeterminate.  LV Wall Scoring: The entire anterior wall, entire anterior septum, and entire apex are akinetic. Right Ventricle: The right ventricular size is not well visualized. Right vetricular wall thickness was not well visualized. Right ventricular systolic function was not well visualized. There is normal pulmonary artery systolic pressure. The tricuspid regurgitant velocity is 2.21 m/s, and with an assumed right atrial pressure of 15 mmHg, the estimated right ventricular systolic pressure is 34.5 mmHg. Left Atrium: Left atrial size was mildly dilated. Right Atrium: Right atrial size was normal in size. Pericardium: There is no evidence of pericardial effusion. Mitral Valve: The mitral valve is normal in structure. Mild mitral valve regurgitation. No evidence of mitral valve stenosis. Tricuspid Valve: The tricuspid valve is normal in structure. Tricuspid valve regurgitation is trivial. No evidence of tricuspid stenosis. Aortic Valve: The aortic valve is tricuspid. Aortic valve regurgitation is not visualized. No aortic stenosis is present. Pulmonic Valve: The pulmonic valve was normal in structure. Pulmonic valve regurgitation is trivial. No evidence of pulmonic stenosis. Aorta: The aortic root and ascending aorta are structurally normal, with no evidence of dilitation. Venous: The inferior vena cava is dilated in size with less than 50% respiratory variability, suggesting right atrial pressure of  15 mmHg. IAS/Shunts: No atrial level shunt detected by color flow Doppler. Additional Comments: A device lead is visualized.  LEFT VENTRICLE PLAX 2D LVIDd:         6.40 cm LVIDs:         5.70 cm LV PW:         1.00 cm LV IVS:        1.00 cm LVOT diam:     2.00 cm LV SV:         26 LV SV Index:   11 LVOT Area:     3.14 cm  IVC IVC diam: 2.80 cm LEFT ATRIUM             Index LA diam:        5.50 cm 2.44 cm/m LA Vol (A2C):   66.3 ml 29.47 ml/m LA Vol (A4C):   53.9 ml 23.95 ml/m LA Biplane Vol: 61.3 ml 27.24 ml/m  AORTIC VALVE LVOT Vmax:   69.80 cm/s LVOT Vmean:  44.500 cm/s LVOT VTI:    0.082 m  AORTA Ao Root diam: 2.90 cm Ao Asc diam:  3.40 cm MR Peak grad: 74.6 mmHg   TRICUSPID VALVE MR Vmax:      432.00 cm/s TR Peak grad:   19.5 mmHg                           TR Vmax:  221.00 cm/s                            SHUNTS                           Systemic VTI:  0.08 m                           Systemic Diam: 2.00 cm Emeline Calender Electronically signed by Emeline Calender Signature Date/Time: 12/13/2023/2:15:37 PM    Final    DG Chest 2 View Result Date: 12/12/2023 CLINICAL DATA:  Shortness of breath. Nausea and vomiting for the past 2 days. EXAM: CHEST - 2 VIEW COMPARISON:  05/29/2023 FINDINGS: Stable enlarged cardiac silhouette, post CABG changes and right subclavian pacer and AICD leads. Clear lungs with normal vascularity. Tortuous and partially calcified thoracic aorta. Thoracolumbar spine degenerative changes. IMPRESSION: 1. No acute abnormality. 2. Stable cardiomegaly. Electronically Signed   By: Elspeth Bathe M.D.   On: 12/12/2023 13:54   CT ABDOMEN PELVIS WO CONTRAST Result Date: 12/12/2023 CLINICAL DATA:  Nausea and decreased urination. EXAM: CT ABDOMEN AND PELVIS WITHOUT CONTRAST TECHNIQUE: Multidetector CT imaging of the abdomen and pelvis was performed following the standard protocol without IV contrast. RADIATION DOSE REDUCTION: This exam was performed according to the departmental dose-optimization  program which includes automated exposure control, adjustment of the mA and/or kV according to patient size and/or use of iterative reconstruction technique. COMPARISON:  CT pelvis dated 05/30/2023 FINDINGS: Evaluation of this exam is limited in the absence of intravenous contrast. Lower chest: The visualized lung bases are clear. Pacemaker wires noted. No intra-abdominal free air or free fluid. Hepatobiliary: There is mild irregularity of the liver contour most consistent with changes of cirrhosis. Clinical correlation recommended. No biliary ductal dilatation. The gallbladder is unremarkable. Pancreas: Unremarkable. No pancreatic ductal dilatation or surrounding inflammatory changes. Spleen: Normal in size without focal abnormality. Adrenals/Urinary Tract: The adrenal glands unremarkable. There is no hydronephrosis or nephrolithiasis on either side. The visualized ureters and urinary bladder appear unremarkable. Stomach/Bowel: Postsurgical changes of bowel with anastomotic staple line in the rectum. There is sigmoid diverticulosis. There is no bowel obstruction or active inflammation. The appendix is normal. Vascular/Lymphatic: Moderate aortoiliac atherosclerotic disease. The IVC is unremarkable. No portal venous gas. There is no adenopathy. Reproductive: The prostate and seminal vesicles are grossly remarkable. Other: None Musculoskeletal: Degenerative changes of the spine. No acute osseous pathology. IMPRESSION: 1. No acute intra-abdominal or pelvic pathology. No hydronephrosis or nephrolithiasis. 2. Sigmoid diverticulosis. No bowel obstruction. Normal appendix. 3.  Aortic Atherosclerosis (ICD10-I70.0). Electronically Signed   By: Vanetta Chou M.D.   On: 12/12/2023 13:23   DG Foot Complete Right Result Date: 11/21/2023 EXAM: 3 or more VIEW(S) XRAY OF THE RIGHT FOOT 11/21/2023 10:25:00 AM COMPARISON: None available. CLINICAL HISTORY: fall with injury to feet. Fell 11 times in 2 weeks. ; Bilateral foot  pain. FINDINGS: BONES AND JOINTS: There is a small, 4 mm ossific density along the dorsal aspect of the anterior talus which may be seen in the setting of avulsion fracture. Mild hallux valgus deformity. Second through fifth mild hammertoe deformities. Plantar Heel spur. SOFT TISSUES: Dorsal soft tissue swelling over the forefoot. IMPRESSION: 1. Small, 4 mm, ossific density along the dorsal anterior talus, possibly representing an avulsion fracture. 2. Dorsal soft tissue swelling over the forefoot. 3. Chronic changes as above.  Electronically signed by: Waddell Calk MD 11/21/2023 11:18 AM EDT RP Workstation: [id number]   DG Foot Complete Left Result Date: 11/21/2023 EXAM: 3 OR MORE VIEW(S) XRAY OF THE LEFT FOOT 11/21/2023 10:25:00 AM COMPARISON: None available. CLINICAL HISTORY: fall with injury to feet. Fell 11 times in 2 weeks. ; Bilateral foot pain. FINDINGS: BONES AND JOINTS: No acute fracture. No focal osseous lesion. No joint dislocation. Plantar calcaneal spur. Mild hallux valgus deformity. Second through fifth mild hammertoe deformities. Degenerative changes noted at the first MTP joint. SOFT TISSUES: The soft tissues are unremarkable. IMPRESSION: 1. No acute fracture or dislocation. 2. Plantar calcaneal spur. 3. Mild hallux valgus deformity and second through fifth mild hammertoe deformities. Electronically signed by: Waddell Calk MD 11/21/2023 11:16 AM EDT RP Workstation: [id number]    EKG:12/13/23 AFwRVR 109 bpm (personally reviewed)  TELEMETRY: AF 90-110's, with PVC's (personally reviewed)  DEVICE HISTORY:  MDT Dual Chamber ICD (initial 03/13/04) with upgrade to dual chamber ICD 09/10/11 for HFrEF. 6949 lead capped.  Device Interrogation 12/13/23 > presenting AF/VS, Battery 3.7-5 years, Impedance - A: 1748 ohms, RV pacing 437 ohms, RV defib 82 ohms, last charge time 4.1 sec, Sensing: P 4.1 mV, R > 20 mV, Threshold RV 0.5V @ 0.56ms  VF zone (222 bpm), FVT off, VT 310 ms (194  bpm)  Assessment/Plan:  VT  Dual Tachycardia - AF & VT  S/p ATP x 1 for VT episode lasting 12 seconds (episode #89) -continue amiodarone in the short term given suspected decompensated HF, likely can stop once euvolemic / optimized  -hopeful we can increase Toprol  some but hemodynamics will dictate  -wavelet match 58-61% -several episodes binned as VT are AFwRVR  -follow electrolytes, goal K+ >4, Mg+>2  Decompensated Systolic Heart Failure -AHF Team planning for central access to assess Co-Ox / determine if low flow state  -diuresis and GDMT per AHF Team   Permanent Atrial Fibrillation  -if BP / HF will allow, consider increasing Toprol  to 25-50 mg as long as BP tolerates  -continue OAC for stroke prophylaxis   Secondary Hypercoagulable State  -continue Eliquis  5mg  BID, dose reviewed and appropriate by age / wt  -may need to transition to heparin  gtt pending procedures      For questions or updates, please contact [facility] Please consult [email or web address] for contact info under     Signed, Daphne Barrack, NP-C, AGACNP-BC [facility] - Electrophysiology  12/13/2023, 5:03 PM

## 2023-12-14 DIAGNOSIS — I5023 Acute on chronic systolic (congestive) heart failure: Secondary | ICD-10-CM | POA: Diagnosis not present

## 2023-12-14 DIAGNOSIS — I472 Ventricular tachycardia, unspecified: Secondary | ICD-10-CM | POA: Diagnosis not present

## 2023-12-14 LAB — COOXEMETRY PANEL
Carboxyhemoglobin: 2.7 % — ABNORMAL HIGH (ref 0.5–1.5)
Methemoglobin: <0.7 % (ref 0.0–1.5)
O2 Saturation: 60.8 %
Total hemoglobin: 14.2 g/dL (ref 12.0–16.0)

## 2023-12-14 LAB — BASIC METABOLIC PANEL WITH GFR
Anion gap: 12 (ref 5–15)
BUN: 32 mg/dL — ABNORMAL HIGH (ref 8–23)
CO2: 25 mmol/L (ref 22–32)
Calcium: 9.3 mg/dL (ref 8.9–10.3)
Chloride: 98 mmol/L (ref 98–111)
Creatinine, Ser: 1.75 mg/dL — ABNORMAL HIGH (ref 0.61–1.24)
GFR, Estimated: 42 mL/min — ABNORMAL LOW (ref >60)
Glucose, Bld: 110 mg/dL — ABNORMAL HIGH (ref 70–99)
Potassium: 4.8 mmol/L (ref 3.5–5.1)
Sodium: 135 mmol/L (ref 135–145)

## 2023-12-14 LAB — MAGNESIUM: Magnesium: 1.8 mg/dL (ref 1.7–2.4)

## 2023-12-14 MED ORDER — SENNOSIDES-DOCUSATE SODIUM 8.6-50 MG PO TABS
1.0000 | ORAL_TABLET | Freq: Every day | ORAL | Status: DC
  Administered 2023-12-14 – 2023-12-20 (×5): 1 via ORAL
  Filled 2023-12-14 (×6): qty 1

## 2023-12-14 MED ORDER — TRAZODONE HCL 50 MG PO TABS
50.0000 mg | ORAL_TABLET | Freq: Every day | ORAL | Status: DC
  Administered 2023-12-14 – 2023-12-27 (×14): 50 mg via ORAL
  Filled 2023-12-14 (×14): qty 1

## 2023-12-14 MED ORDER — AMIODARONE HCL 200 MG PO TABS
400.0000 mg | ORAL_TABLET | Freq: Two times a day (BID) | ORAL | Status: DC
  Administered 2023-12-14 – 2023-12-16 (×5): 400 mg via ORAL
  Filled 2023-12-14 (×5): qty 2

## 2023-12-14 MED ORDER — METOPROLOL SUCCINATE ER 25 MG PO TB24
25.0000 mg | ORAL_TABLET | Freq: Every day | ORAL | Status: DC
  Administered 2023-12-15: 25 mg via ORAL
  Filled 2023-12-14: qty 1

## 2023-12-14 MED ORDER — METOPROLOL TARTRATE 12.5 MG HALF TABLET
12.5000 mg | ORAL_TABLET | Freq: Once | ORAL | Status: AC
  Administered 2023-12-14: 12.5 mg via ORAL
  Filled 2023-12-14: qty 1

## 2023-12-14 MED ORDER — MAGNESIUM SULFATE 2 GM/50ML IV SOLN
2.0000 g | Freq: Once | INTRAVENOUS | Status: AC
  Administered 2023-12-14: 2 g via INTRAVENOUS
  Filled 2023-12-14: qty 50

## 2023-12-14 MED ORDER — FUROSEMIDE 10 MG/ML IJ SOLN
60.0000 mg | Freq: Two times a day (BID) | INTRAMUSCULAR | Status: DC
  Filled 2023-12-14: qty 6

## 2023-12-14 MED ORDER — FUROSEMIDE 10 MG/ML IJ SOLN
60.0000 mg | Freq: Two times a day (BID) | INTRAMUSCULAR | Status: AC
  Administered 2023-12-14 (×2): 60 mg via INTRAVENOUS
  Filled 2023-12-14: qty 6

## 2023-12-14 MED ORDER — TAMSULOSIN HCL 0.4 MG PO CAPS
0.4000 mg | ORAL_CAPSULE | Freq: Every day | ORAL | Status: DC
  Administered 2023-12-14 – 2023-12-28 (×15): 0.4 mg via ORAL
  Filled 2023-12-14 (×15): qty 1

## 2023-12-14 NOTE — Plan of Care (Signed)

## 2023-12-14 NOTE — Progress Notes (Signed)
 Patient ID: Douglas [name], male   DOB: 1955-11-13, 68 y.o.   MRN: [id number]     Advanced Heart Failure Rounding Note  Cardiologist: Danelle Birmingham, MD  Chief Complaint: CHF Subjective:    Foley placed yesterday due to minimal UOP, had immediate 500 cc urine out and abdominal pain improved.  CVP 10-11 on my read today.  No co-ox yet.  Creatinine 2.02 => 1.75. Lactate was 1.5.  UA with no evidence for UTI.   He remains on IV amiodarone gtt, no further VT.   Objective:   Weight Range: 110.9 kg Body mass index is 36.1 kg/m.   Vital Signs:   Temp:  [96.9 F (36.1 C)-98.3 F (36.8 C)] 97.7 F (36.5 C) (10/18 0800) Pulse Rate:  [89-134] 109 (10/18 0800) Resp:  [14-25] 19 (10/18 0800) BP: (98-127)/(54-102) 124/86 (10/18 0800) SpO2:  [88 %-96 %] 93 % (10/18 0800) Last BM Date : 12/13/23  Weight change: Filed Weights   12/12/23 1131 12/12/23 2130 12/12/23 2145  Weight: 130 kg 110.9 kg 110.9 kg    Intake/Output:   Intake/Output Summary (Last 24 hours) at 12/14/2023 0939 Last data filed at 12/14/2023 0800 Gross per 24 hour  Intake 877.49 ml  Output 1145 ml  Net -267.51 ml      Physical Exam    General: NAD, obese.  Neck: Thick, JVP difficult but appear 10 range, no thyromegaly or thyroid nodule.  Lungs: Clear to auscultation bilaterally with normal respiratory effort. CV: Nondisplaced PMI.  Heart irregular S1/S2, no S3/S4, no murmur.  Trace ankle edema.  Abdomen: Soft, nontender, no hepatosplenomegaly, no distention.  Skin: Intact without lesions or rashes.  Neurologic: Alert and oriented x 3.  Psych: Normal affect. Extremities: No clubbing or cyanosis.  HEENT: Normal.   Telemetry   Atrial fibrillation rate 90s (personally reviewed)  Labs    CBC Recent Labs    12/12/23 1234 12/13/23 0643  WBC 7.2 8.6  NEUTROABS 4.6  --   HGB 14.8 15.4  HCT 48.9 51.5  MCV 81.9 82.1  PLT 314 341   Basic Metabolic Panel Recent Labs    89/82/74 1503 12/14/23 0408  NA  137 135  K 5.1 4.8  CL 101 98  CO2 24 25  GLUCOSE 115* 110*  BUN 27* 32*  CREATININE 2.02* 1.75*  CALCIUM  9.2 9.3  MG 2.0 1.8   Liver Function Tests Recent Labs    12/12/23 1234  AST 31  ALT 13  ALKPHOS 107  BILITOT 1.5*  PROT 8.8*  ALBUMIN 4.3   Recent Labs    12/12/23 1234  LIPASE 16   Cardiac Enzymes No results for input(s): CKTOTAL, CKMB, CKMBINDEX, TROPONINI in the last 72 hours.  BNP: BNP (last 3 results) Recent Labs    04/23/23 1840 05/28/23 0322 05/29/23 0413  BNP 253.0* 557.0* 672.0*    ProBNP (last 3 results) Recent Labs    12/12/23 1234  PROBNP 5,740.0*     D-Dimer No results for input(s): DDIMER in the last 72 hours. Hemoglobin A1C No results for input(s): HGBA1C in the last 72 hours. Fasting Lipid Panel No results for input(s): CHOL, HDL, LDLCALC, TRIG, CHOLHDL, LDLDIRECT in the last 72 hours. Thyroid Function Tests Recent Labs    12/12/23 1831  TSH 1.670    Other results:   Imaging    DG CHEST PORT 1 VIEW Result Date: 12/13/2023 EXAM: 1 VIEW(S) XRAY OF THE CHEST 12/13/2023 04:16:00 PM COMPARISON: 12/12/2023 CLINICAL HISTORY: Encounter for central line placement  [id number] FINDINGS: LINES, TUBES AND DEVICES: Left IJ central venous catheter in place with tip in proximal SVC. Stable right AICD in place. LUNGS AND PLEURA: Central pulmonary vascular congestion. No focal pulmonary opacity. No pleural effusion. No pneumothorax. HEART AND MEDIASTINUM: Aortic atherosclerosis noted. No acute abnormality of the cardiac and mediastinal silhouettes. BONES AND SOFT TISSUES: Sternotomy wires and CABG markers noted. No acute osseous abnormality. IMPRESSION: 1. Left IJ central venous catheter with tip in the proximal SVC. No pneumothorax. 2. Central pulmonary vascular congestion. Electronically signed by: Norman Gatlin MD 12/13/2023 04:52 PM EDT RP Workstation: [id number]   US  EKG SITE RITE Result Date: 12/13/2023 If Site Rite  image not attached, placement could not be confirmed due to current cardiac rhythm.  ECHOCARDIOGRAM COMPLETE Result Date: 12/13/2023    ECHOCARDIOGRAM REPORT   Patient Name:   Douglas [name] Date of Exam: 12/13/2023 Medical Rec #:  [id number]        Height:       69.0 in Accession #:    [id number]       Weight:       244.5 lb Date of Birth:  11-29-1955        BSA:          2.250 m Patient Age:    67 years         BP:           94/78 mmHg Patient Gender: M                HR:           116 bpm. Exam Location:  Inpatient Procedure: 2D Echo, Color Doppler, Cardiac Doppler and Intracardiac            Opacification Agent (Both Spectral and Color Flow Doppler were            utilized during procedure). Indications:    Ventricular Tachycardia I47.2  History:        Patient has prior history of Echocardiogram examinations, most                 recent 06/04/2023. CHF and Cardiomyopathy, CAD, Defibrillator and                 Pacemaker, Arrythmias:Atrial Fibrillation and Tachycardia; Risk                 Factors:Dyslipidemia and Hypertension.  Sonographer:    Koleen Popper RDCS Referring Phys: [id number] EJIROGHENE E EMOKPAE IMPRESSIONS  1. Left ventricular ejection fraction, by estimation, is 30 to 35%. The left ventricle has moderately decreased function. The left ventricle demonstrates regional wall motion abnormalities (see scoring diagram/findings for description). The left ventricular internal cavity size was moderately to severely dilated. Left ventricular diastolic parameters are indeterminate.  2. Right ventricular systolic function was not well visualized. The right ventricular size is not well visualized. There is normal pulmonary artery systolic pressure.  3. Left atrial size was mildly dilated.  4. The mitral valve is normal in structure. Mild mitral valve regurgitation. No evidence of mitral stenosis.  5. The aortic valve is tricuspid. Aortic valve regurgitation is not visualized. No aortic stenosis is present.  6.  The inferior vena cava is dilated in size with <50% respiratory variability, suggesting right atrial pressure of 15 mmHg. Comparison(s): A prior study was performed on 05/28/2023. Prior images reviewed side by side. The ejection fraction was 35-40%. On personal review there were similar wall motion abnormalities at that time with worse  overall function today. The LV cavity is more dilated today (5.6 cm-> 6.4 cm at end diastole, 4.5 cm -> 5.7 cm at end systole). There was moderate biatrial enlargement. Conclusion(s)/Recommendation(s): Cannot exclude a small apical thrombus. Consider a cardiac CT or MRI if clinically indicated. FINDINGS  Left Ventricle: Left ventricular ejection fraction, by estimation, is 30 to 35%. The left ventricle has moderately decreased function. The left ventricle demonstrates regional wall motion abnormalities. Definity  contrast agent was given IV to delineate the left ventricular endocardial borders. The left ventricular internal cavity size was moderately to severely dilated. There is no left ventricular hypertrophy. Left ventricular diastolic parameters are indeterminate.  LV Wall Scoring: The entire anterior wall, entire anterior septum, and entire apex are akinetic. Right Ventricle: The right ventricular size is not well visualized. Right vetricular wall thickness was not well visualized. Right ventricular systolic function was not well visualized. There is normal pulmonary artery systolic pressure. The tricuspid regurgitant velocity is 2.21 m/s, and with an assumed right atrial pressure of 15 mmHg, the estimated right ventricular systolic pressure is 34.5 mmHg. Left Atrium: Left atrial size was mildly dilated. Right Atrium: Right atrial size was normal in size. Pericardium: There is no evidence of pericardial effusion. Mitral Valve: The mitral valve is normal in structure. Mild mitral valve regurgitation. No evidence of mitral valve stenosis. Tricuspid Valve: The tricuspid valve is  normal in structure. Tricuspid valve regurgitation is trivial. No evidence of tricuspid stenosis. Aortic Valve: The aortic valve is tricuspid. Aortic valve regurgitation is not visualized. No aortic stenosis is present. Pulmonic Valve: The pulmonic valve was normal in structure. Pulmonic valve regurgitation is trivial. No evidence of pulmonic stenosis. Aorta: The aortic root and ascending aorta are structurally normal, with no evidence of dilitation. Venous: The inferior vena cava is dilated in size with less than 50% respiratory variability, suggesting right atrial pressure of 15 mmHg. IAS/Shunts: No atrial level shunt detected by color flow Doppler. Additional Comments: A device lead is visualized.  LEFT VENTRICLE PLAX 2D LVIDd:         6.40 cm LVIDs:         5.70 cm LV PW:         1.00 cm LV IVS:        1.00 cm LVOT diam:     2.00 cm LV SV:         26 LV SV Index:   11 LVOT Area:     3.14 cm  IVC IVC diam: 2.80 cm LEFT ATRIUM             Index LA diam:        5.50 cm 2.44 cm/m LA Vol (A2C):   66.3 ml 29.47 ml/m LA Vol (A4C):   53.9 ml 23.95 ml/m LA Biplane Vol: 61.3 ml 27.24 ml/m  AORTIC VALVE LVOT Vmax:   69.80 cm/s LVOT Vmean:  44.500 cm/s LVOT VTI:    0.082 m  AORTA Ao Root diam: 2.90 cm Ao Asc diam:  3.40 cm MR Peak grad: 74.6 mmHg   TRICUSPID VALVE MR Vmax:      432.00 cm/s TR Peak grad:   19.5 mmHg                           TR Vmax:        221.00 cm/s  SHUNTS                           Systemic VTI:  0.08 m                           Systemic Diam: 2.00 cm Emeline Calender Electronically signed by Emeline Calender Signature Date/Time: 12/13/2023/2:15:37 PM    Final      Medications:     Scheduled Medications:  amiodarone  400 mg Oral BID   apixaban   5 mg Oral BID   Chlorhexidine  Gluconate Cloth  6 each Topical Daily   citalopram   20 mg Oral Daily   ezetimibe   10 mg Oral Daily   fluticasone   1 spray Each Nare Daily   fluticasone  furoate-vilanterol  1 puff Inhalation Daily    furosemide   60 mg Intravenous BID   [START ON 12/15/2023] metoprolol  succinate  25 mg Oral Daily   metoprolol  tartrate  12.5 mg Oral Once   pantoprazole   20 mg Oral Daily   rosuvastatin   40 mg Oral Daily   tamsulosin   0.4 mg Oral Daily   traZODone   50 mg Oral QHS    Infusions:  magnesium  sulfate bolus IVPB      PRN Medications: acetaminophen  **OR** acetaminophen , albuterol , ALPRAZolam , artificial tears, ondansetron  **OR** ondansetron  (ZOFRAN ) IV, mouth rinse, oxyCODONE -acetaminophen  **AND** oxyCODONE , polyethylene glycol, sodium chloride , tiZANidine    Assessment/Plan   1. Acute on chronic systolic CHF: Ischemic cardiomyopathy.  [employer] ICD.  He has been on minimal GDMT at home.  Patient has a long-standing cardiomyopathy, echoes with EF in the 20-25% range since at least 2015.  By my review of echo this admission, I would call EF 25% with moderate RV dysfunction and dilated IVC. Exam is difficult with thick neck.  Co-ox 73% yesterday with normal lactate.  CVP 10-11 this morning.   Creatinine 2.02 => 1.75.  - Send co-ox today.  - I will give Lasix  60 mg IV bid today, probably can resume torsemide  po tomorrow.  - Increase Toprol  XL to 25 mg daily, continue to slowly titrate up.  He used to be on 200 mg daily but was lightheaded with falls on this dose.  - Resume Jardiance  when foley out.  - Titrate other GDMT when creatinine settles down, probably spironolactone  tomorrow.  2. VT: Patient has permanent AF but was noted to have a run of VT this admission treated with ATP (dual tachycardia).  He is now on amiodarone gtt and in atrial fibrillation.  No further VT.  Seen by EP, would like to try to go up on Toprol  XL and avoid long-term amiodarone.  - Transition to po amiodarone today.  - Increase Toprol  XL as above.  3. Atrial fibrillation: Permanent.  He is currently rate-controlled.  - Continue apixaban .  4. AKI: Creatinine jumped 1.09 => 1.86 => 2.02 with elevated K.  He received  Lokelma.  Foley placed, 500 cc urine out and abdominal pain resolved.  ?Obstruction by BPH. Creatinine lower at 1.75 today.  - Start Flomax  for possible BPH.  5. CAD: H/o CABG 2002.  Last cath in 2013 with occluded LAD and RCA, 2 occluded distal OMs.  No chest pain.  Troponin minimally elevated with no trend, suspect demand ischemia.  - On apixaban  with stable CAD so no ASA.  - Continue statin.   Ok for transfer to progressive unit.   Length of Stay: 2  Ezra Shuck,  MD  12/14/2023, 9:39 AM  Advanced Heart Failure Team Pager [phone] (M-F; 7a - 5p)  Please contact [facility] for night-coverage after hours (5p -7a ) and weekends on [email or web address]

## 2023-12-14 NOTE — Progress Notes (Signed)
 PROGRESS NOTE  Douglas [name]  DOB: 07-05-1955  PCP: Mancil Pfeiffer, [place] FMW:[id number]  DOA: 12/12/2023  LOS: 2 days  Hospital Day: 3  Subjective: Patient was seen and examined this morning.  Elderly Caucasian male.  Sitting up in recliner.  Complains of chronic back pain and discomfort on recliner. Has not had a bowel movement in few days No family at bedside Chart reviewed. Afebrile, heart rate remains elevated over 100.  Was as high as 128 this morning.  Blood pressure in normal range, currently on 5 L oxygen Labs this morning with creatinine better at 1.75  Brief narrative: Douglas [name] is a 68 y.o. male with PMH significant for obesity, DM2, HTN, HLD, A-fib on Eliquis , CAD, ischemic cardiopathy, CHF s/p AICD, anemia, asthma, opiate dependence 10/16, patient presented to ED at [facility] with complaint of 2 days of nausea, vomiting, abdominal pain, inability to urinate.  In the ED, patient is afebrile, heart rate more than 110, blood pressure in 150s, O2 sat 86% on room air, required 4 L oxygen for O2 sat over 90% Labs with proBNP elevated to over 5700, renal function normal, WBC count normal. RVP negative Chest x-ray unremarkable. Initial EKG with A-fib at 96 bpm CT abdomen and pelvis did not show any acute intra-abdominal or pelvic pathology, hydronephrosis or nephrolithiasis.  Ureters and urinary bladder unremarkable.  Sigmoid diverticulosis Admitted to [facility]  While in the ED, telemetry showed episodes of sustained V. tach but converted to A-fib spontaneously. Patient remained hemodynamically stable. Cardiology was consulted and recommended initiation of amiodarone drip, interrogation of pacemaker, diuresis and transferred to [facility]. Patient was given 1 dose of Lasix .  Assessment and plan: Acute exacerbation of chronic systolic CHF Hypertension Acute respiratory failure with hypoxia Presented with abnormal symptoms and respiratory failure  proBNP  was elevated to over 5700.  Per cardiology note, Optivol suggests ongoing fluid accumulation since July Most recent echo from April 2024 with EF 35 to 40%.  Pending repeat echo PTA meds- Toprol  25 mg daily, Jardiance  10 mg daily, Aldactone , torsemide  20 mg daily Heart failure services following Currently on Toprol  25 mg daily Creatinine went up to 2.02 yesterday.  Lasix  was held with subsequent improvement in creatinine to 1.75 today. Currently requiring 5 L oxygen. Continue to monitor for daily intake output, weight, blood pressure, BNP, renal function and electrolytes. Net IO Since Admission: -301.49 mL [12/14/23 1444] Recent Labs  Lab 12/12/23 1234 12/12/23 1831 12/13/23 0643 12/13/23 1503 12/13/23 1756 12/14/23 0408  PROBNP 5,740.0*  --   --   --   --   --   LATICACIDVEN  --   --   --  1.4 1.5  --   BUN 15  --  21 27*  --  32*  CREATININE 1.09  --  1.86* 2.02*  --  1.75*  NA 138  --  134* 137  --  135  K 3.4*  --  5.9* 5.1  --  4.8  MG  --  2.0  --  2.0  --  1.8   A-fib with RVR  new Ventricular tachycardia- Initial EKG with A-fib with mild tachycardia. Had 2 minutes of ventricular tachycardia in the ED and converted to A-fib again Patient was unaware. Per cardiology recommendation was started on amiodarone drip.  Also on metoprolol  succinate 25 mg daily Currently followed by EP. Chronically anticoagulated with Eliquis   Elevated troponin H/o CAD, HLD Troponinwas  elevated to 53 .  Likely demand ischemia  in the setting of RVR and CHF exacerbation Continue Toprol , Eliquis , Zetia , crestor  Recent Labs    12/13/23 0643  TROPONINIHS 53*   AKI Baseline creatinine normal.  Creatinine peaked at 2.02 yesterday with 2 doses of IV Lasix . Improving now, 1.75 today.  Continue to monitor Recent Labs    04/25/23 0350 05/28/23 0322 05/29/23 0413 05/30/23 0405 05/31/23 0410 11/21/23 0933 12/12/23 1234 12/13/23 0643 12/13/23 1503 12/14/23 0408  BUN 21 24* 21 23 23 17 15 21   27* 32*  CREATININE 1.09 1.23 0.92 0.99 1.17 1.91* 1.09 1.86* 2.02* 1.75*  CO2 25 23 28 25 25 23 27  20* 24 25   HypOkalemia/hypERkalemia Potassium was initially low at 3.4.  Elevated to 5.9 on 10/17 likely due to acute renal failure. With 1 dose of Lokelma, potassium level improved to normal. Recent Labs  Lab 12/12/23 1234 12/12/23 1831 12/13/23 0643 12/13/23 1503 12/14/23 0408  K 3.4*  --  5.9* 5.1 4.8  MG  --  2.0  --  2.0 1.8   Abdominal pain Primary patient with lower abdominal pain, difficulty with voiding.   CT abdomen and pelvis without contrast negative for acute abnormality.  Urinary bladder, ureters kidneys without signs of obstruction.    Type 2 diabetes mellitus A1c 5.9 on April 2025 PTA meds-Jardiance  10 mg daily Continue SSI/Accu-Cheks Recent Labs  Lab 12/12/23 2146 12/13/23 1140  GLUCAP 142* 128*   Chronic low back pain Chronic opiate dependence Report multiple falls  PT eval obtained.  Home health PT recommended. PTA meds- Percocet 10/325 every 6 hours as needed continue the same  Constipation Start on scheduled and as needed bowel regimen  Anxiety/depression PTA meds-Celexa  20 mg daily, Xanax  1 mg twice daily as needed Currently on same  Obesity 2 Body mass index is 36.56 kg/m. Patient has been advised to make an attempt to improve diet and exercise patterns to aid in weight loss.    Mobility: Seen by PT.  Home with PT recommended PT Orders: Active   PT Follow up Rec: Home Health Pt10/17/2025 1500   Goals of care   Code Status: Full Code     DVT prophylaxis:   apixaban  (ELIQUIS ) tablet 5 mg   Antimicrobials: None Fluid: None Consultants: Cardiology Family Communication: None at bedside  Status: Inpatient Level of care:  Progressive   Patient is from: Home Needs to continue in-hospital care: Ongoing workup Anticipated d/c to: Hopefully home eventually.    Diet:  Diet Order             Diet regular Room service  appropriate? Yes; Fluid consistency: Thin  Diet effective now                   Scheduled Meds:  amiodarone  400 mg Oral BID   apixaban   5 mg Oral BID   Chlorhexidine  Gluconate Cloth  6 each Topical Daily   citalopram   20 mg Oral Daily   ezetimibe   10 mg Oral Daily   fluticasone   1 spray Each Nare Daily   fluticasone  furoate-vilanterol  1 puff Inhalation Daily   furosemide   60 mg Intravenous BID   [START ON 12/15/2023] metoprolol  succinate  25 mg Oral Daily   pantoprazole   20 mg Oral Daily   rosuvastatin   40 mg Oral Daily   senna-docusate  1 tablet Oral QHS   tamsulosin   0.4 mg Oral Daily   traZODone   50 mg Oral QHS    PRN meds: acetaminophen  **OR** acetaminophen , albuterol , ALPRAZolam ,  artificial tears, ondansetron  **OR** ondansetron  (ZOFRAN ) IV, mouth rinse, oxyCODONE -acetaminophen  **AND** oxyCODONE , polyethylene glycol, sodium chloride , tiZANidine    Infusions:     Antimicrobials: Anti-infectives (From admission, onward)    None       Objective: Vitals:   12/14/23 1400 12/14/23 1430  BP: 106/81 108/68  Pulse: 81 87  Resp: 17 18  Temp:  98 F (36.7 C)  SpO2: 93% 93%    Intake/Output Summary (Last 24 hours) at 12/14/2023 1444 Last data filed at 12/14/2023 1400 Gross per 24 hour  Intake 1219.04 ml  Output 2195 ml  Net -975.96 ml   Filed Weights   12/12/23 2130 12/12/23 2145 12/14/23 1430  Weight: 110.9 kg 110.9 kg 112.3 kg   Weight change:  Body mass index is 36.56 kg/m.   Physical Exam: General exam: Pleasant, elderly.  Mild distress due to chronic.  On recliner.  On low-flow oxygen Skin: No rashes, lesions or ulcers. HEENT: Atraumatic, normocephalic, no obvious bleeding Lungs: Clear to auscultation bilaterally,  CVS: Mild irregular tachycardia no murmur,   GI/Abd: Soft, nontender, distended from obesity, bowel sound present,   CNS: Alert, awake, oriented to place Psychiatry: Mood appropriate Extremities: No pedal edema, no calf tenderness,    Data Review: I have personally reviewed the laboratory data and studies available.  F/u labs ordered Unresulted Labs (From admission, onward)     Start     Ordered   12/15/23 0500  Magnesium   Daily,   R     Question:  Specimen collection method  Answer:  Unit=Unit collect   12/14/23 0931   12/15/23 0500  Cooxemetry Panel (carboxy, met, total hgb, O2 sat)  Daily,   R     Question:  Specimen collection method  Answer:  Unit=Unit collect   12/14/23 0931   12/15/23 0500  CBC  Daily,   R     Question:  Specimen collection method  Answer:  Unit=Unit collect   12/14/23 0931   12/14/23 0500  Basic metabolic panel with GFR  Daily,   R     Question:  Specimen collection method  Answer:  Lab=Lab collect   12/13/23 1336            Signed, Chapman Rota, MD [facility] 12/14/2023

## 2023-12-15 ENCOUNTER — Inpatient Hospital Stay (HOSPITAL_COMMUNITY)

## 2023-12-15 DIAGNOSIS — I5023 Acute on chronic systolic (congestive) heart failure: Secondary | ICD-10-CM | POA: Diagnosis not present

## 2023-12-15 DIAGNOSIS — I472 Ventricular tachycardia, unspecified: Secondary | ICD-10-CM | POA: Diagnosis not present

## 2023-12-15 LAB — CBC
HCT: 44.2 % (ref 39.0–52.0)
Hemoglobin: 12.9 g/dL — ABNORMAL LOW (ref 13.0–17.0)
MCH: 24.2 pg — ABNORMAL LOW (ref 26.0–34.0)
MCHC: 29.2 g/dL — ABNORMAL LOW (ref 30.0–36.0)
MCV: 82.9 fL (ref 80.0–100.0)
Platelets: 252 K/uL (ref 150–400)
RBC: 5.33 MIL/uL (ref 4.22–5.81)
RDW: 21.5 % — ABNORMAL HIGH (ref 11.5–15.5)
WBC: 7.6 K/uL (ref 4.0–10.5)
nRBC: 0 % (ref 0.0–0.2)

## 2023-12-15 LAB — BASIC METABOLIC PANEL WITH GFR
Anion gap: 9 (ref 5–15)
BUN: 29 mg/dL — ABNORMAL HIGH (ref 8–23)
CO2: 30 mmol/L (ref 22–32)
Calcium: 8.9 mg/dL (ref 8.9–10.3)
Chloride: 95 mmol/L — ABNORMAL LOW (ref 98–111)
Creatinine, Ser: 1.34 mg/dL — ABNORMAL HIGH (ref 0.61–1.24)
GFR, Estimated: 58 mL/min — ABNORMAL LOW (ref >60)
Glucose, Bld: 109 mg/dL — ABNORMAL HIGH (ref 70–99)
Potassium: 4.2 mmol/L (ref 3.5–5.1)
Sodium: 134 mmol/L — ABNORMAL LOW (ref 135–145)

## 2023-12-15 LAB — COOXEMETRY PANEL
Carboxyhemoglobin: 1.5 % (ref 0.5–1.5)
Carboxyhemoglobin: 1.3 % (ref 0.5–1.5)
Methemoglobin: <0.7 % (ref 0.0–1.5)
Methemoglobin: 0.8 % (ref 0.0–1.5)
O2 Saturation: 68.9 %
O2 Saturation: 62.2 %
Total hemoglobin: 13.8 g/dL (ref 12.0–16.0)
Total hemoglobin: 13.1 g/dL (ref 12.0–16.0)

## 2023-12-15 LAB — MAGNESIUM: Magnesium: 2 mg/dL (ref 1.7–2.4)

## 2023-12-15 MED ORDER — FUROSEMIDE 10 MG/ML IJ SOLN
INTRAMUSCULAR | Status: AC
  Filled 2023-12-15: qty 4

## 2023-12-15 MED ORDER — METOPROLOL SUCCINATE ER 25 MG PO TB24
25.0000 mg | ORAL_TABLET | Freq: Once | ORAL | Status: AC
  Administered 2023-12-15: 25 mg via ORAL
  Filled 2023-12-15: qty 1

## 2023-12-15 MED ORDER — FUROSEMIDE 10 MG/ML IJ SOLN
INTRAMUSCULAR | Status: AC
  Filled 2023-12-15: qty 2

## 2023-12-15 MED ORDER — SPIRONOLACTONE 12.5 MG HALF TABLET
12.5000 mg | ORAL_TABLET | Freq: Every day | ORAL | Status: DC
  Administered 2023-12-15 – 2023-12-16 (×2): 12.5 mg via ORAL
  Filled 2023-12-15 (×2): qty 1

## 2023-12-15 MED ORDER — FUROSEMIDE 10 MG/ML IJ SOLN
60.0000 mg | Freq: Once | INTRAMUSCULAR | Status: AC
  Administered 2023-12-15: 60 mg via INTRAVENOUS

## 2023-12-15 MED ORDER — METOPROLOL SUCCINATE ER 50 MG PO TB24
50.0000 mg | ORAL_TABLET | Freq: Every day | ORAL | Status: DC
Start: 2023-12-16 — End: 2023-12-18
  Administered 2023-12-16 – 2023-12-18 (×3): 50 mg via ORAL
  Filled 2023-12-15 (×3): qty 1

## 2023-12-15 NOTE — Progress Notes (Signed)
   12/15/23 1840  What Happened  Was fall witnessed? Yes  Who witnessed fall? Dorothy Muhoro RN  Patients activity before fall ambulating-assisted  Point of contact head  Was patient injured? Unsure  Provider Notification  Provider Name/Title Dr Samie  Date Provider Notified 12/15/23  Time Provider Notified 1830  Method of Notification  (Secure Chat)  Notification Reason Fall  Provider response Evaluate remotely  Date of Provider Response 12/15/23  Time of Provider Response 1000  Follow Up  Family notified No - patient refusal  Additional tests Yes-comment (CT Head)  Simple treatment Ice  Progress note created (see row info) Yes  Adult Fall Risk Assessment  Risk Factor Category (scoring not indicated) High fall risk per protocol (document High fall risk)  Patient Fall Risk Level High fall risk  Adult Fall Risk Interventions  Required Bundle Interventions *See Row Information* High fall risk  Additional Interventions Room near nurses station;Use of appropriate toileting equipment (bedpan, BSC, etc.)  Fall intervention(s) refused/Patient educated regarding refusal Bed alarm;Nonskid socks;Open door if unsupervised  Screening for Fall Injury Risk (To be completed on HIGH fall risk patients) - Assessing Need for Floor Mats  Risk For Fall Injury- Criteria for Floor Mats Bleeding risk-anticoagulation (not prophylaxis)  Will Implement Floor Mats Yes  Vitals  BP 108/66  MAP (mmHg) 78  Pulse Rate 93  ECG Heart Rate 90  Oxygen Therapy  O2 Device Nasal Cannula  O2 Flow Rate (L/min) 3 L/min  Pain Assessment  Pain Scale 0-10  Pain Score 4  Pain Type Acute pain  Pain Location Head  Pain Descriptors / Indicators Aching  Pain Intervention(s) Medication (See eMAR)  Neurological  Neuro (WDL) X  Level of Consciousness Alert  Orientation Level Oriented X4  Cognition Appropriate judgement;Follows commands  Speech Clear  R Pupil Size (mm)  (blind)  R Pupil Reaction Brisk  L Pupil  Size (mm) 3  R Hand Grip Moderate  L Hand Grip Moderate  RUE Motor Response Purposeful movement  LUE Motor Response Purposeful movement  RLE Motor Response Purposeful movement  LLE Motor Response Purposeful movement  Neuro Symptoms Anxiety  Glasgow Coma Scale  Eye Opening 4  Best Verbal Response (NON-intubated) 5  Best Motor Response 6  Glasgow Coma Scale Score 15  Musculoskeletal  Musculoskeletal (WDL) X  Assistive Device Front wheel walker  Generalized Weakness Yes  Weight Bearing Restrictions Per Provider Order No  Integumentary  Integumentary (WDL) X  Skin Color Appropriate for ethnicity  Skin Condition Dry  Skin Integrity Intact  Skin Turgor Non-tenting  Pain Assessment  Date Pain First Started 12/15/23  Result of Injury No

## 2023-12-15 NOTE — Progress Notes (Signed)
 PROGRESS NOTE  Douglas [name]  DOB: Nov 03, 1955  PCP: Mancil Pfeiffer, [place] FMW:[id number]  DOA: 12/12/2023  LOS: 3 days  Hospital Day: 4  Subjective: Patient was seen and examined this morning.  Sitting up in recliner.  Not in distress.  Family not at bedside. Hemodynamically stable, breathing on room air Labs from this morning with sodium 134, BUN/creatinine better at 29/1.34  Brief narrative: Douglas [name] is a 68 y.o. male with PMH significant for obesity, DM2, HTN, HLD, A-fib on Eliquis , CAD, ischemic cardiopathy, CHF s/p AICD, anemia, asthma, opiate dependence 10/16, patient presented to ED at [facility] with complaint of 2 days of nausea, vomiting, abdominal pain, inability to urinate.  In the ED, patient is afebrile, heart rate more than 110, blood pressure in 150s, O2 sat 86% on room air, required 4 L oxygen for O2 sat over 90% Labs with proBNP elevated to over 5700, renal function normal, WBC count normal. RVP negative Chest x-ray unremarkable. Initial EKG with A-fib at 96 bpm CT abdomen and pelvis did not show any acute intra-abdominal or pelvic pathology, hydronephrosis or nephrolithiasis.  Ureters and urinary bladder unremarkable.  Sigmoid diverticulosis Admitted to [facility]  While in the ED, telemetry showed episodes of sustained V. tach but converted to A-fib spontaneously. Patient remained hemodynamically stable. Cardiology was consulted and recommended initiation of amiodarone drip, interrogation of pacemaker, diuresis and transferred to [facility].  Assessment and plan: Acute exacerbation of chronic systolic CHF Hypertension Acute respiratory failure with hypoxia Presented with abnormal symptoms and respiratory failure  proBNP was elevated to over 5700.  Per cardiology note, Optivol suggests ongoing fluid accumulation since July Most recent echo from April 2024 with EF 35 to 40%.  Pending repeat echo PTA meds- Toprol  25 mg daily, Jardiance  10 mg  daily, Aldactone , torsemide  20 mg daily Heart failure services following Diuresed >2.2 L with IV Lasix , limited by elevated creatinine.  GDMT limited by low blood pressure. Currently on Toprol  25 mg daily.  Cardiology gave 1 dose of Lasix  60 mg IV today, noted plan of 20 mg oral daily tomorrow.  Started on Aldactone  12.5 mg daily. Currently requiring 3 L oxygen. Continue to monitor for daily intake output, weight, blood pressure, BNP, renal function and electrolytes. Net IO Since Admission: -3,026.49 mL [12/15/23 1459] Recent Labs  Lab 12/12/23 1234 12/12/23 1831 12/13/23 0643 12/13/23 1503 12/13/23 1756 12/14/23 0408 12/15/23 0510  PROBNP 5,740.0*  --   --   --   --   --   --   LATICACIDVEN  --   --   --  1.4 1.5  --   --   BUN 15  --  21 27*  --  32* 29*  CREATININE 1.09  --  1.86* 2.02*  --  1.75* 1.34*  NA 138  --  134* 137  --  135 134*  K 3.4*  --  5.9* 5.1  --  4.8 4.2  MG  --  2.0  --  2.0  --  1.8 2.0   A-fib with RVR  new Ventricular tachycardia- Initial EKG with A-fib with mild tachycardia. Had 2 minutes of ventricular tachycardia in the ED and converted to A-fib again Patient was unaware. Per cardiology recommendation was started on amiodarone drip.  Increased metoprolol  succinate from 25 mg daily to 50 mg daily today.  Currently followed by EP. Chronically anticoagulated with Eliquis   Elevated troponin H/o CAD, HLD Troponinwas  elevated to 53 .  Likely demand ischemia  in the setting of RVR and CHF exacerbation Continue Toprol , Eliquis , Zetia , crestor  Recent Labs    12/13/23 0643  TROPONINIHS 53*   AKI Baseline creatinine normal.  Creatinine peaked at 2.02 with 2 doses of IV Lasix . Creatinine improving after Lasix  was stopped.  Continue to monitor Recent Labs    05/28/23 0322 05/29/23 0413 05/30/23 0405 05/31/23 0410 11/21/23 0933 12/12/23 1234 12/13/23 0643 12/13/23 1503 12/14/23 0408 12/15/23 0510  BUN 24* 21 23 23 17 15 21  27* 32* 29*   CREATININE 1.23 0.92 0.99 1.17 1.91* 1.09 1.86* 2.02* 1.75* 1.34*  CO2 23 28 25 25 23 27  20* 24 25 30    HypOkalemia/hypERkalemia Potassium level improved Recent Labs  Lab 12/12/23 1234 12/12/23 1831 12/13/23 0643 12/13/23 1503 12/14/23 0408 12/15/23 0510  K 3.4*  --  5.9* 5.1 4.8 4.2  MG  --  2.0  --  2.0 1.8 2.0   Abdominal pain Primarily presented with lower abdominal pain, difficulty with voiding.   CT abdomen and pelvis without contrast negative for acute abnormality.   Urinary bladder, ureters kidneys without signs of obstruction.   Flomax  added by cardiology for suspected BPH.  Type 2 diabetes mellitus A1c 5.9 on April 2025 PTA meds-Jardiance  10 mg daily Continue SSI/Accu-Cheks Recent Labs  Lab 12/12/23 2146 12/13/23 1140  GLUCAP 142* 128*   Chronic low back pain Chronic opiate dependence Report multiple falls  PT eval obtained.  Home health PT recommended. PTA meds- Percocet 10/325 every 6 hours as needed continue the same  Constipation Start on scheduled and as needed bowel regimen  Anxiety/depression PTA meds-Celexa  20 mg daily, Xanax  1 mg twice daily as needed Currently on same  Obesity 2 Body mass index is 37.08 kg/m. Patient has been advised to make an attempt to improve diet and exercise patterns to aid in weight loss.    Mobility: Mobility is limited due to back pain.  Seen by PT.  Home with PT recommended PT Orders: Active   PT Follow up Rec: Home Health Pt10/17/2025 1500   Goals of care   Code Status: Full Code     DVT prophylaxis:   apixaban  (ELIQUIS ) tablet 5 mg   Antimicrobials: None Fluid: None Consultants: Cardiology Family Communication: None at bedside  Status: Inpatient Level of care:  Progressive   Patient is from: Home Needs to continue in-hospital care: Ongoing workup Anticipated d/c to: Hopefully home eventually in 1 to 2 days    Diet:  Diet Order             Diet regular Room service appropriate? Yes;  Fluid consistency: Thin  Diet effective now                   Scheduled Meds:  amiodarone  400 mg Oral BID   apixaban   5 mg Oral BID   Chlorhexidine  Gluconate Cloth  6 each Topical Daily   citalopram   20 mg Oral Daily   ezetimibe   10 mg Oral Daily   fluticasone   1 spray Each Nare Daily   fluticasone  furoate-vilanterol  1 puff Inhalation Daily   [START ON 12/16/2023] metoprolol  succinate  50 mg Oral Daily   pantoprazole   20 mg Oral Daily   rosuvastatin   40 mg Oral Daily   senna-docusate  1 tablet Oral QHS   spironolactone   12.5 mg Oral Daily   tamsulosin   0.4 mg Oral Daily   traZODone   50 mg Oral QHS    PRN meds: acetaminophen  **OR** acetaminophen , albuterol ,  ALPRAZolam , artificial tears, ondansetron  **OR** ondansetron  (ZOFRAN ) IV, mouth rinse, oxyCODONE -acetaminophen  **AND** oxyCODONE , polyethylene glycol, sodium chloride , tiZANidine    Infusions:     Antimicrobials: Anti-infectives (From admission, onward)    None       Objective: Vitals:   12/15/23 0900 12/15/23 1400  BP:  120/67  Pulse:  93  Resp:  (!) 21  Temp:  97.8 F (36.6 C)  SpO2: 100% 96%    Intake/Output Summary (Last 24 hours) at 12/15/2023 1459 Last data filed at 12/15/2023 1100 Gross per 24 hour  Intake --  Output 2725 ml  Net -2725 ml   Filed Weights   12/12/23 2145 12/14/23 1430 12/15/23 0501  Weight: 110.9 kg 112.3 kg 113.9 kg   Weight change:  Body mass index is 37.08 kg/m.   Physical Exam: General exam: Pleasant, elderly.  Mild distress due to chronic.  On recliner.  On low-flow oxygen Skin: No rashes, lesions or ulcers. HEENT: Atraumatic, normocephalic, no obvious bleeding Lungs: Clear to auscultation bilaterally CVS: S1-S2, no murmur GI/Abd: Soft, nontender, distended from obesity, bowel sound present,   CNS: Alert, awake, oriented to place Psychiatry: Mood appropriate Extremities: No pedal edema, no calf tenderness,   Data Review: I have personally reviewed the  laboratory data and studies available.  F/u labs ordered Unresulted Labs (From admission, onward)     Start     Ordered   12/15/23 0500  Magnesium   Daily,   R     Question:  Specimen collection method  Answer:  Unit=Unit collect   12/14/23 0931   12/15/23 0500  Cooxemetry Panel (carboxy, met, total hgb, O2 sat)  Daily,   R     Question:  Specimen collection method  Answer:  Unit=Unit collect   12/14/23 0931   12/15/23 0500  CBC  Daily,   R     Question:  Specimen collection method  Answer:  Unit=Unit collect   12/14/23 0931   12/14/23 0500  Basic metabolic panel with GFR  Daily,   R     Question:  Specimen collection method  Answer:  Lab=Lab collect   12/13/23 1336            Signed, Chapman Rota, MD [facility] 12/15/2023

## 2023-12-15 NOTE — Progress Notes (Signed)
 Patient ID: Douglas [name], male   DOB: 03/23/1955, 68 y.o.   MRN: [id number]     Advanced Heart Failure Rounding Note  Cardiologist: Danelle Birmingham, MD  Chief Complaint: CHF Subjective:    Foley placed due to minimal UOP, had immediate 500 cc urine out and abdominal pain improved.  Good diuresis yesterday, I/Os net negative 2293 with IV Lasix .  CVP 9-10 on my read today.  No co-ox today but 68% yesterday.  Creatinine 2.02 => 1.75 => 1.34.  UA with no evidence for UTI.   He is now on po amiodarone, had 13 beat run NSVT overnight.   Objective:   Weight Range: 113.9 kg Body mass index is 37.08 kg/m.   Vital Signs:   Temp:  [97.6 F (36.4 C)-98.3 F (36.8 C)] 98.3 F (36.8 C) (10/19 0808) Pulse Rate:  [81-139] 87 (10/19 0808) Resp:  [11-20] 20 (10/19 0808) BP: (102-121)/(61-89) 113/78 (10/19 0808) SpO2:  [92 %-100 %] 100 % (10/19 0900) FiO2 (%):  [36 %] 36 % (10/19 0900) Weight:  [112.3 kg-113.9 kg] 113.9 kg (10/19 0501) Last BM Date : 12/13/23  Weight change: Filed Weights   12/12/23 2145 12/14/23 1430 12/15/23 0501  Weight: 110.9 kg 112.3 kg 113.9 kg    Intake/Output:   Intake/Output Summary (Last 24 hours) at 12/15/2023 1039 Last data filed at 12/15/2023 0501 Gross per 24 hour  Intake 276.76 ml  Output 3025 ml  Net -2748.24 ml      Physical Exam    General: NAD Neck: JVP 8-9 cm, no thyromegaly or thyroid nodule.  Lungs: Clear to auscultation bilaterally with normal respiratory effort. CV: Nondisplaced PMI.  Heart regular S1/S2, no S3/S4, no murmur. 1+ ankle edema.  Abdomen: Soft, nontender, no hepatosplenomegaly, no distention.  Skin: Intact without lesions or rashes.  Neurologic: Alert and oriented x 3.  Psych: Normal affect. Extremities: No clubbing or cyanosis.  HEENT: Normal.   Telemetry   Atrial fibrillation rate 90s with 1 run 13 beats NSVT (personally reviewed)  Labs    CBC Recent Labs    12/12/23 1234 12/13/23 0643 12/15/23 0510  WBC 7.2  8.6 7.6  NEUTROABS 4.6  --   --   HGB 14.8 15.4 12.9*  HCT 48.9 51.5 44.2  MCV 81.9 82.1 82.9  PLT 314 341 252   Basic Metabolic Panel Recent Labs    89/81/74 0408 12/15/23 0510  NA 135 134*  K 4.8 4.2  CL 98 95*  CO2 25 30  GLUCOSE 110* 109*  BUN 32* 29*  CREATININE 1.75* 1.34*  CALCIUM  9.3 8.9  MG 1.8 2.0   Liver Function Tests Recent Labs    12/12/23 1234  AST 31  ALT 13  ALKPHOS 107  BILITOT 1.5*  PROT 8.8*  ALBUMIN 4.3   Recent Labs    12/12/23 1234  LIPASE 16   Cardiac Enzymes No results for input(s): CKTOTAL, CKMB, CKMBINDEX, TROPONINI in the last 72 hours.  BNP: BNP (last 3 results) Recent Labs    04/23/23 1840 05/28/23 0322 05/29/23 0413  BNP 253.0* 557.0* 672.0*    ProBNP (last 3 results) Recent Labs    12/12/23 1234  PROBNP 5,740.0*     D-Dimer No results for input(s): DDIMER in the last 72 hours. Hemoglobin A1C No results for input(s): HGBA1C in the last 72 hours. Fasting Lipid Panel No results for input(s): CHOL, HDL, LDLCALC, TRIG, CHOLHDL, LDLDIRECT in the last 72 hours. Thyroid Function Tests Recent Labs    12/12/23  1831  TSH 1.670    Other results:   Imaging    No results found.    Medications:     Scheduled Medications:  amiodarone  400 mg Oral BID   apixaban   5 mg Oral BID   Chlorhexidine  Gluconate Cloth  6 each Topical Daily   citalopram   20 mg Oral Daily   ezetimibe   10 mg Oral Daily   fluticasone   1 spray Each Nare Daily   fluticasone  furoate-vilanterol  1 puff Inhalation Daily   metoprolol  succinate  25 mg Oral Once   [START ON 12/16/2023] metoprolol  succinate  50 mg Oral Daily   pantoprazole   20 mg Oral Daily   rosuvastatin   40 mg Oral Daily   senna-docusate  1 tablet Oral QHS   spironolactone   12.5 mg Oral Daily   tamsulosin   0.4 mg Oral Daily   traZODone   50 mg Oral QHS    Infusions:    PRN Medications: acetaminophen  **OR** acetaminophen , albuterol , ALPRAZolam ,  artificial tears, ondansetron  **OR** ondansetron  (ZOFRAN ) IV, mouth rinse, oxyCODONE -acetaminophen  **AND** oxyCODONE , polyethylene glycol, sodium chloride , tiZANidine    Assessment/Plan   1. Acute on chronic systolic CHF: Ischemic cardiomyopathy.  [employer] ICD.  He has been on minimal GDMT at home, apparently limited by low BP.  Patient has a long-standing cardiomyopathy, echoes with EF in the 20-25% range since at least 2015.  By my review of echo this admission, I would call EF 25% with moderate RV dysfunction and dilated IVC. He diuresed well yesterday, I/Os net negative 2293.  CVP 9-10 this morning.   Creatinine 2.02 => 1.75 => 1.34. Co-ox 68% yesterday.  - Send co-ox today.  - Lasix  60 mg IV x 1 today, probably can resume torsemide  20 mg po daily tomorrow.  - Increase Toprol  XL to 50 mg daily, probably would not go higher than this.  He used to be on 200 mg daily but was lightheaded with falls on this dose.  - Resume Jardiance  when foley out.  - Start spironolactone  12.5 mg daily.   2. VT: Patient has permanent AF but was noted to have a run of VT this admission treated with ATP (dual tachycardia).  He was stated on amiodarone, now po.  Had run NSVT last night.  Trigger for VT may have been drastic drop recently in Toprol  XL dose 200 mg daily => 12.5 daily.   - Increase Toprol  XL back to 50 mg daily as above.  Will try to keep this as his maintenance dose.  Do not think he will tolerate going back to 200 mg daily.  - Continue po amiodarone for now, ?long-term need.  3. Atrial fibrillation: Permanent.  He is currently rate-controlled.  - Continue apixaban .  - Adjusting Toprol  XL as above.  4. AKI: Creatinine jumped 1.09 => 1.86 => 2.02 with elevated K.  He received Lokelma.  Foley placed, 500 cc urine out and abdominal pain resolved.  ?Obstruction by BPH. Creatinine lower at 1.75 => 1.34 today. AKI may have been obstructive.  - Start Flomax  for suspected BPH.  - IV Lasix  today as above, would  try to get foley out tomorrow.  5. CAD: H/o CABG 2002.  Last cath in 2013 with occluded LAD and RCA, 2 occluded distal OMs.  No chest pain.  Troponin minimally elevated with no trend, suspect demand ischemia.  - On apixaban  with stable CAD so no ASA.  - Continue statin.   Mobilize, has limited mobility at home due to back pain.  Uses walker.   Length of Stay: 3  Ezra Shuck, MD  12/15/2023, 10:39 AM  Advanced Heart Failure Team Pager [phone] (M-F; 7a - 5p)  Please contact [facility] for night-coverage after hours (5p -7a ) and weekends on [email or web address]

## 2023-12-15 NOTE — Progress Notes (Addendum)
   12/15/23 1810  ECG Monitoring  Ectopy Ventricular tachycardia, non-sustained (Vtach run. Notified Actuary. Stated pt had a fall.)   Patient passed out while he was ambulating using walker to go the bathroom with my assistance. I witnessed patient fall forward face first. He was unresponsive for less than a minute. Code blue called. Patient became responsive without interventions. He was assisted up and sat on the toilet. Code blue cancelled, Resident Dr had already arrived. Rapid Response Nurse in the room as well. CT head ordered. Vitals done. Primary Dr informed. Episode of VT noted.

## 2023-12-16 DIAGNOSIS — I472 Ventricular tachycardia, unspecified: Secondary | ICD-10-CM | POA: Diagnosis not present

## 2023-12-16 DIAGNOSIS — I5023 Acute on chronic systolic (congestive) heart failure: Secondary | ICD-10-CM | POA: Diagnosis not present

## 2023-12-16 LAB — COOXEMETRY PANEL
Carboxyhemoglobin: 2.3 % — ABNORMAL HIGH (ref 0.5–1.5)
Methemoglobin: <0.7 % (ref 0.0–1.5)
O2 Saturation: 67.6 %
Total hemoglobin: 13.1 g/dL (ref 12.0–16.0)

## 2023-12-16 LAB — BASIC METABOLIC PANEL WITH GFR
Anion gap: 10 (ref 5–15)
BUN: 32 mg/dL — ABNORMAL HIGH (ref 8–23)
CO2: 30 mmol/L (ref 22–32)
Calcium: 8.7 mg/dL — ABNORMAL LOW (ref 8.9–10.3)
Chloride: 93 mmol/L — ABNORMAL LOW (ref 98–111)
Creatinine, Ser: 1.4 mg/dL — ABNORMAL HIGH (ref 0.61–1.24)
GFR, Estimated: 55 mL/min — ABNORMAL LOW (ref >60)
Glucose, Bld: 123 mg/dL — ABNORMAL HIGH (ref 70–99)
Potassium: 4.8 mmol/L (ref 3.5–5.1)
Sodium: 133 mmol/L — ABNORMAL LOW (ref 135–145)

## 2023-12-16 LAB — MAGNESIUM: Magnesium: 1.7 mg/dL (ref 1.7–2.4)

## 2023-12-16 LAB — CBC
HCT: 42.8 % (ref 39.0–52.0)
Hemoglobin: 12.5 g/dL — ABNORMAL LOW (ref 13.0–17.0)
MCH: 24.4 pg — ABNORMAL LOW (ref 26.0–34.0)
MCHC: 29.2 g/dL — ABNORMAL LOW (ref 30.0–36.0)
MCV: 83.4 fL (ref 80.0–100.0)
Platelets: 258 K/uL (ref 150–400)
RBC: 5.13 MIL/uL (ref 4.22–5.81)
RDW: 21 % — ABNORMAL HIGH (ref 11.5–15.5)
WBC: 8.2 K/uL (ref 4.0–10.5)
nRBC: 0 % (ref 0.0–0.2)

## 2023-12-16 MED ORDER — MAGNESIUM SULFATE 2 GM/50ML IV SOLN
2.0000 g | Freq: Once | INTRAVENOUS | Status: DC
  Filled 2023-12-16: qty 50

## 2023-12-16 MED ORDER — MAGNESIUM SULFATE 2 GM/50ML IV SOLN
2.0000 g | Freq: Once | INTRAVENOUS | Status: DC
Start: 2023-12-16 — End: 2023-12-16

## 2023-12-16 MED ORDER — AMIODARONE LOAD VIA INFUSION
150.0000 mg | Freq: Once | INTRAVENOUS | Status: AC
  Administered 2023-12-16: 150 mg via INTRAVENOUS
  Filled 2023-12-16: qty 83.34

## 2023-12-16 MED ORDER — MEXILETINE HCL 150 MG PO CAPS
150.0000 mg | ORAL_CAPSULE | Freq: Two times a day (BID) | ORAL | Status: DC
  Administered 2023-12-16 – 2023-12-28 (×25): 150 mg via ORAL
  Filled 2023-12-16 (×26): qty 1

## 2023-12-16 MED ORDER — AMIODARONE HCL IN DEXTROSE 360-4.14 MG/200ML-% IV SOLN
30.0000 mg/h | INTRAVENOUS | Status: DC
  Administered 2023-12-16 – 2023-12-18 (×4): 30 mg/h via INTRAVENOUS
  Filled 2023-12-16 (×4): qty 200

## 2023-12-16 MED ORDER — AMIODARONE HCL IN DEXTROSE 360-4.14 MG/200ML-% IV SOLN
60.0000 mg/h | INTRAVENOUS | Status: DC
  Administered 2023-12-16 (×2): 60 mg/h via INTRAVENOUS
  Filled 2023-12-16: qty 200

## 2023-12-16 MED ORDER — FUROSEMIDE 10 MG/ML IJ SOLN
80.0000 mg | Freq: Once | INTRAMUSCULAR | Status: AC
  Administered 2023-12-16: 80 mg via INTRAVENOUS
  Filled 2023-12-16: qty 8

## 2023-12-16 MED ORDER — MAGNESIUM SULFATE 4 GM/100ML IV SOLN
4.0000 g | Freq: Once | INTRAVENOUS | Status: AC
  Administered 2023-12-16: 4 g via INTRAVENOUS
  Filled 2023-12-16: qty 100

## 2023-12-16 NOTE — Plan of Care (Signed)
  Problem: Clinical Measurements: Goal: Ability to maintain clinical measurements within normal limits will improve Outcome: Progressing Goal: Diagnostic test results will improve Outcome: Progressing   Problem: Nutrition: Goal: Adequate nutrition will be maintained Outcome: Progressing   Problem: Pain Managment: Goal: General experience of comfort will improve and/or be controlled Outcome: Progressing
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
## 2023-12-16 NOTE — Progress Notes (Signed)
 EP recommends LHC/bypass angiography to r/o coronary ischemia as etiology for recurrent VT.  Discussed with patient. He is agreeable to proceed.  Informed Consent   Shared Decision Making/Informed Consent{  The risks [stroke (1 in 1000), death (1 in 1000), kidney failure [usually temporary] (1 in 500), bleeding (1 in 200), allergic reaction [possibly serious] (1 in 200)], benefits (diagnostic support and management of coronary artery disease) and alternatives of a cardiac catheterization were discussed in detail with [name] and he is willing to proceed.

## 2023-12-16 NOTE — Progress Notes (Addendum)
 Patient ID: Douglas [name], male   DOB: September 25, 1955, 68 y.o.   MRN: [id number]     Advanced Heart Failure Rounding Note  Cardiologist: Danelle Birmingham, MD   Chief Complaint: CHF  Subjective:    Yesterday evening patient was ambulating to the bathroom with his walker. RN witnessed him fall face first. He was briefly unconscious. He does not recall the event. CT head okay.  Telemetry reviewed. Episodes of monomorphic VT noted with what appears to be ATP/? Shock.   Received 60 mg lasix  IV yesterday. Had 3.7L UOP. CVP 12 this am. CO-OX 68%.  No shortness of breath. Denies chest pain. Concerned about chronic back pain.    Objective:   Weight Range: 113.9 kg Body mass index is 37.08 kg/m.   Vital Signs:   Temp:  [97.5 F (36.4 C)-98.3 F (36.8 C)] 98 F (36.7 C) (10/20 0355) Pulse Rate:  [50-93] 50 (10/20 0355) Resp:  [18-21] 18 (10/20 0355) BP: (76-123)/(53-89) 104/69 (10/20 0355) SpO2:  [91 %-100 %] 97 % (10/20 0355) FiO2 (%):  [36 %] 36 % (10/19 0900) Last BM Date : 12/13/23  Weight change: Filed Weights   12/12/23 2145 12/14/23 1430 12/15/23 0501  Weight: 110.9 kg 112.3 kg 113.9 kg    Intake/Output:   Intake/Output Summary (Last 24 hours) at 12/16/2023 0726 Last data filed at 12/16/2023 0400 Gross per 24 hour  Intake 720 ml  Output 3730 ml  Net -3010 ml      Physical Exam    General:  Chronically ill appearing Neck: JVP 10-12 Cor: Irregular rhythm. No murmurs. Lungs: clear anteriorly Abdomen: obese, soft, nontender, nondistended.  Extremities: no edema Neuro: alert & orientedx3. Affect pleasant   Telemetry   Afib 50s-60s  Labs    CBC Recent Labs    12/15/23 0510 12/16/23 0215  WBC 7.6 8.2  HGB 12.9* 12.5*  HCT 44.2 42.8  MCV 82.9 83.4  PLT 252 258   Basic Metabolic Panel Recent Labs    89/80/74 0510 12/16/23 0215  NA 134* 133*  K 4.2 4.8  CL 95* 93*  CO2 30 30  GLUCOSE 109* 123*  BUN 29* 32*  CREATININE 1.34* 1.40*  CALCIUM  8.9  8.7*  MG 2.0 1.7   Liver Function Tests No results for input(s): AST, ALT, ALKPHOS, BILITOT, PROT, ALBUMIN in the last 72 hours.  No results for input(s): LIPASE, AMYLASE in the last 72 hours.  Cardiac Enzymes No results for input(s): CKTOTAL, CKMB, CKMBINDEX, TROPONINI in the last 72 hours.  BNP: BNP (last 3 results) Recent Labs    04/23/23 1840 05/28/23 0322 05/29/23 0413  BNP 253.0* 557.0* 672.0*    ProBNP (last 3 results) Recent Labs    12/12/23 1234  PROBNP 5,740.0*     D-Dimer No results for input(s): DDIMER in the last 72 hours. Hemoglobin A1C No results for input(s): HGBA1C in the last 72 hours. Fasting Lipid Panel No results for input(s): CHOL, HDL, LDLCALC, TRIG, CHOLHDL, LDLDIRECT in the last 72 hours. Thyroid Function Tests No results for input(s): TSH, T4TOTAL, T3FREE, THYROIDAB in the last 72 hours.  Invalid input(s): FREET3   Other results:   Imaging    CT HEAD WO CONTRAST ( ) Result Date: 12/16/2023 EXAM: CT HEAD WITHOUT CONTRAST 12/15/2023 11:53:00 PM TECHNIQUE: CT of the head was performed without the administration of intravenous contrast. Automated exposure control, iterative reconstruction, and/or weight based adjustment of the mA/kV was utilized to reduce the radiation dose to as low as reasonably achievable.  COMPARISON: Comparison made with prior CT from 04/23/2023. CLINICAL HISTORY: Syncope/presyncope, cerebrovascular cause suspected; on DOAC. FINDINGS: BRAIN AND VENTRICLES: No acute hemorrhage. No evidence of acute infarct. Mild chronic microvascular ischemic disease, stable. No hydrocephalus. No extra-axial collection. No mass effect or midline shift. Mild calcified atherosclerosis present about the skull base. ORBITS: Chronic changes at the right globe noted. SINUSES: Mild mucosal thickening about the ethmoid air cells. Paranasal sinuses are otherwise clear. SOFT TISSUES AND SKULL: No acute  soft tissue abnormality. No skull fracture. IMPRESSION: 1. No acute intracranial abnormality. 2. Mild chronic microvascular ischemic disease, stable. Electronically signed by: Morene Hoard MD 12/16/2023 12:18 AM EDT RP Workstation: [id number]      Medications:     Scheduled Medications:  amiodarone  400 mg Oral BID   apixaban   5 mg Oral BID   Chlorhexidine  Gluconate Cloth  6 each Topical Daily   citalopram   20 mg Oral Daily   ezetimibe   10 mg Oral Daily   fluticasone   1 spray Each Nare Daily   fluticasone  furoate-vilanterol  1 puff Inhalation Daily   metoprolol  succinate  50 mg Oral Daily   pantoprazole   20 mg Oral Daily   rosuvastatin   40 mg Oral Daily   senna-docusate  1 tablet Oral QHS   spironolactone   12.5 mg Oral Daily   tamsulosin   0.4 mg Oral Daily   traZODone   50 mg Oral QHS    Infusions:    PRN Medications: acetaminophen  **OR** acetaminophen , albuterol , ALPRAZolam , artificial tears, ondansetron  **OR** ondansetron  (ZOFRAN ) IV, mouth rinse, oxyCODONE -acetaminophen  **AND** oxyCODONE , polyethylene glycol, sodium chloride , tiZANidine    Assessment/Plan   1. Acute on chronic systolic CHF: Ischemic cardiomyopathy.  [employer] ICD.  He has been on minimal GDMT at home, apparently limited by low BP.  Patient has a long-standing cardiomyopathy, echoes with EF in the 20-25% range since at least 2015.  By Dr. Rolan review of echo this admission, call EF 25% with moderate RV dysfunction and dilated IVC.  - CO-OX 68% - CVP 12-13. Give 80 mg lasix  IV.  - Continue Toprol  XL 50 mg daily, probably would not go higher than this.  He used to be on 200 mg daily but was lightheaded with falls on this dose.  - Resume Jardiance  when foley out. Order placed to remove today, discussed with RN - Continue spiro 12.5 mg daily (did not increase today d/t K 4.8)  2. VT: Patient has permanent AF but was noted to have a run of VT this admission treated with ATP (dual tachycardia).  Seen by  EP. He was stated on amiodarone, now po.  Trigger for VT may have been drastic drop recently in Toprol  XL dose 200 mg daily => 12.5 daily.   - Another run of VT 10/19 w/ syncopal event, looks like treated with ATP (cannot tell if terminated with shock). Will interrogate device. Rep called. - Aggressively supp Mag today - Continue Toprol  XL 50 mg daily.  Will try to keep this as his maintenance dose.  Do not think he will tolerate going back to 200 mg daily.  - Already on 400 mg amiodarone BID. Need additional IV loading?  3. Atrial fibrillation: Permanent.  He is currently rate-controlled.  - Continue apixaban .  - Toprol  XL increased as above  4. AKI: Creatinine jumped 1.09 => 1.86 => 2.02 with elevated K.  He received Lokelma.  Foley placed, 500 cc urine out and abdominal pain resolved.  ?Obstruction by BPH. Creatinine down to 1.4 today. AKI may  have been obstructive.  - Started Flomax  for suspected BPH.  - IV Lasix  today as above, remove foley  5. CAD: H/o CABG 2002.  Last cath in 2013 with occluded LAD and RCA, 2 occluded distal OMs.  No chest pain.  Troponin minimally elevated with no trend, suspect demand ischemia.  - On apixaban  with stable CAD so no ASA.  - Continue statin.     Length of Stay: 4  FINCH, LINDSAY N, PA-C  12/16/2023, 7:26 AM  Advanced Heart Failure Team Pager [phone] (M-F; 7a - 5p)  Please contact [facility] for night-coverage after hours (5p -7a ) and weekends on [email or web address]  Patient seen and examined with the above-signed Advanced Practice Provider and/or Housestaff. I personally reviewed laboratory data, imaging studies and relevant notes. I independently examined the patient and formulated the important aspects of the plan. I have edited the note to reflect any of my changes or salient points. I have personally discussed the plan with the patient and/or family.  Had recurrent VT with ATP and possible shock with syncope. On po amio. Head CT ok.   Remains  volume overloaded Co-ox ok .  General:  Chronically ill appearing. No resp difficulty HEENT: normal Neck: supple. JVP to jaw  Carotids 2+ bilat; no bruits. No lymphadenopathy or thryomegaly appreciated. Cor:  Regular rate & rhythm. No rubs, gallops or murmurs. Lungs: clear Abdomen: soft, nontender, nondistended. No hepatosplenomegaly. No bruits or masses. Good bowel sounds. Extremities: no cyanosis, clubbing, rash, edema Neuro: alert & orientedx3, cranial nerves grossly intact. moves all 4 extremities w/o difficulty. Affect pleasant  Continue with episodes of hemodynamically significant VT. Will ask EP to weigh in. May need to reload IV amio.   Volume status elevated. Continue IV diuresis  Keep K > 4.0 Mg>2.0  Start SGTL2i   Ketty Bitton, MD  10:04 AM

## 2023-12-16 NOTE — TOC Progression Note (Signed)
 Transition of Care [facility]) - Progression Note    Patient Details  Name: Douglas [name] MRN: [id number] Date of Birth: 1955-12-15  Transition of Care [facility] CM/SW Contact  [name] Phone Number: [phone] 12/16/2023, 2:07 PM  Clinical Narrative:  HF CSW waiting on PT/OT eval.   HF CSW/CM will continue to follow and monitor for dc readiness.       Expected Discharge Plan: Skilled Nursing Facility Barriers to Discharge: Continued Medical Work up               Expected Discharge Plan and Services   Discharge Planning Services: CM Consult Post Acute Care Choice: Skilled Nursing Facility Living arrangements for the past 2 months: Single Family Home                                       Social Drivers of Health (SDOH) Interventions SDOH Screenings   Food Insecurity: No Food Insecurity (12/13/2023)  Housing: Low Risk  (12/13/2023)  Transportation Needs: No Transportation Needs (12/13/2023)  Utilities: Not At Risk (12/13/2023)  Depression (PHQ2-9): Medium Risk (11/14/2023)  Social Connections: Moderately Isolated (12/13/2023)  Tobacco Use: Low Risk  (12/13/2023)    Readmission Risk Interventions    05/31/2023   11:35 AM 05/30/2023   10:20 AM  Readmission Risk Prevention Plan  Transportation Screening Complete Complete  Home Care Screening Complete Complete  Medication Review (RN CM) Complete Complete

## 2023-12-16 NOTE — Progress Notes (Signed)
 Physical Therapy Treatment Patient Details Name: ANDDY WINGERT MRN: [id number] DOB: Mar 19, 1955 Today's Date: 12/16/2023   History of Present Illness LAVELL SUPPLE is a 68 y.o. male admitted 10/16 with abdominal pain, N/V.  Had VTach in ED and code called.  Afib as well.  PMH significant for obesity, DM2, HTN, HLD, A-fib on Eliquis , CAD, ischemic cardiopathy, CHF s/p AICD, anemia, asthma, opiate dependence    PT Comments  Pt admitted with above diagnosis. Pt progressing with PT and able to incr distance with use of RW. Pt did not have any episodes with ambulation.  O2 saturation dropped on RA to <90% therefore replaced O2 at 2L with sats 94%.  Did try to decr pt to 1L at rest and desat to 89% therefore left pt at rest on 2L.  Pt tolerated rx well.  Pt currently with functional limitations due to the deficits listed below (see PT Problem List). Pt will benefit from acute skilled PT to increase their independence and safety with mobility to allow discharge.       If plan is discharge home, recommend the following: Assistance with cooking/housework;Assist for transportation;Help with stairs or ramp for entrance   Can travel by private vehicle        Equipment Recommendations  None recommended by PT    Recommendations for Other Services       Precautions / Restrictions Precautions Precautions: Fall Restrictions Weight Bearing Restrictions Per Provider Order: No     Mobility  Bed Mobility Overal bed mobility: Needs Assistance Bed Mobility: Sit to Supine       Sit to supine: Supervision   General bed mobility comments: in chair ona rrival . No assist needed to get pt back to bed at end of rx.    Transfers Overall transfer level: Needs assistance   Transfers: Sit to/from Stand Sit to Stand: +2 safety/equipment, Contact guard assist           General transfer comment: Pt needed cues for hand placement but can power up without assist.     Ambulation/Gait Ambulation/Gait assistance: +2 safety/equipment, Contact guard assist Gait Distance (Feet): 300 Feet Assistive device: Rolling walker (2 wheels) Gait Pattern/deviations: Step-through pattern, Decreased stride length, Drifts right/left   Gait velocity interpretation: <1.31 ft/sec, indicative of household ambulator   General Gait Details: Pt was able to ambulate with RW withCGA assist of 2 for safety. No LOB but occasional cues to slow down and followed with chair due to episode pt had with nursing yesterday.  Pt on 3LO2 on arrival with sats 94%.  Removed O2 and sats as low as 88% on RA with activity therefore placed pt on 2L with sats at 96%. When turned to 1L 90% therefore left pt on 2LO2.  BP 96/69 at end of rx.   Stairs             Wheelchair Mobility     Tilt Bed    Modified Rankin (Stroke Patients Only)       Balance Overall balance assessment: Needs assistance Sitting-balance support: No upper extremity supported, Feet supported Sitting balance-Leahy Scale: Fair     Standing balance support: Bilateral upper extremity supported, During functional activity, Reliant on assistive device for balance Standing balance-Leahy Scale: Poor Standing balance comment: relies on RW                            Communication Communication Communication: No apparent difficulties  Cognition Arousal:  Alert Behavior During Therapy: WFL for tasks assessed/performed   PT - Cognitive impairments: No apparent impairments                         Following commands: Intact      Cueing    Exercises General Exercises - Lower Extremity Ankle Circles/Pumps: AROM, Both, 10 reps, Supine Long Arc Quad: AROM, Both, 10 reps, Seated Hip Flexion/Marching: AROM, Both, 10 reps, Seated    General Comments        Pertinent Vitals/Pain Pain Assessment Pain Assessment: Faces Faces Pain Scale: Hurts little more Pain Location: back Pain Descriptors /  Indicators: Aching, Grimacing, Guarding Pain Intervention(s): Limited activity within patient's tolerance, Monitored during session, Repositioned    Home Living                          Prior Function            PT Goals (current goals can now be found in the care plan section) Acute Rehab PT Goals Patient Stated Goal: to go home Progress towards PT goals: Progressing toward goals    Frequency    Min 2X/week      PT Plan      Co-evaluation              AM-PAC PT 6 Clicks Mobility   Outcome Measure  Help needed turning from your back to your side while in a flat bed without using bedrails?: A Little Help needed moving from lying on your back to sitting on the side of a flat bed without using bedrails?: A Little Help needed moving to and from a bed to a chair (including a wheelchair)?: A Little Help needed standing up from a chair using your arms (e.g., wheelchair or bedside chair)?: A Little Help needed to walk in hospital room?: A Little Help needed climbing 3-5 steps with a railing? : A Lot 6 Click Score: 17    End of Session Equipment Utilized During Treatment: Gait belt;Oxygen Activity Tolerance: Patient limited by fatigue Patient left: with call bell/phone within reach;in bed;with bed alarm set Nurse Communication: Mobility status PT Visit Diagnosis: Muscle weakness (generalized) (M62.81)     Time: 8560-8486 PT Time Calculation (min) (ACUTE ONLY): 34 min  Charges:    $Gait Training: 8-22 mins $Therapeutic Exercise: 8-22 mins PT General Charges $$ ACUTE PT VISIT: 1 Visit                     Daven Montz M,PT Acute Rehab Services [phone]    Stephane JULIANNA Bevel 12/16/2023, 4:48 PM

## 2023-12-16 NOTE — Progress Notes (Signed)
 PROGRESS NOTE  Douglas [name]  DOB: August 11, 1955  PCP: Douglas [name], [place] FMW:[id number]  DOA: 12/12/2023  LOS: 4 days  Hospital Day: 5  Subjective: Patient was seen and examined this morning. Sitting up in recliner.  Was upset with an earlier encounter with another provider this morning, I am not sure with who.  Event from last night noted.  He was ambulating to the bathroom with his walker and RN witnessed him fall face first, he was briefly unconscious.  RRT was called.  Did not lose pulse, back to baseline within a minute.  Patient does not recall the event. CT head was done which did not show any acute intracranial abnormality. Telemetry strips from the incident showed episodes of monomorphic V. tach and possibly got shocked.  Overnight, afebrile, hemodynamically stable with blood pressure in low 100s, breathing room air Labs from this morning with sodium 133, BUN 32/1.4 Sitting up in recliner.  Not in distress.  Family not at bedside. Hemodynamically stable, breathing on room air Labs from this morning with sodium 134, BUN/creatinine better at 29/1.34  Brief narrative: Douglas [name] is a 68 y.o. male with PMH significant for obesity, DM2, HTN, HLD, A-fib on Eliquis , CAD, ischemic cardiopathy, CHF s/p AICD, anemia, asthma, opiate dependence 10/16, patient presented to ED at [facility] with complaint of 2 days of nausea, vomiting, abdominal pain, inability to urinate.  In the ED, patient is afebrile, heart rate more than 110, blood pressure in 150s, O2 sat 86% on room air, required 4 L oxygen for O2 sat over 90% Labs with proBNP elevated to over 5700, renal function normal, WBC count normal. RVP negative Chest x-ray unremarkable. Initial EKG with A-fib at 96 bpm CT abdomen and pelvis did not show any acute intra-abdominal or pelvic pathology, hydronephrosis or nephrolithiasis.  Ureters and urinary bladder unremarkable.  Sigmoid diverticulosis Admitted to  [facility]  While in the ED, telemetry showed episodes of sustained V. tach but converted to A-fib spontaneously. Patient remained hemodynamically stable. Cardiology was consulted and recommended initiation of amiodarone drip, interrogation of pacemaker, diuresis and transferred to [facility]. Heart failure service is following.  Assessment and plan: Acute exacerbation of chronic systolic CHF Hypertension Presented with abnormal symptoms and respiratory failure  proBNP was elevated to over 5700.  Per cardiology note, Optivol suggests ongoing fluid accumulation since July Most recent echo from April 2024 with EF 35 to 40%.  Pending repeat echo PTA meds- Toprol  25 mg daily, Jardiance  10 mg daily, Aldactone , torsemide  20 mg daily Heart failure services following Net negative balance more than 4.6 L.  Noted plan to give 1 dose of Lasix  IV 80 mg today Defer to cardiology for GDMT. Currently on Toprol , Aldactone  Continue to monitor for daily intake output, weight, blood pressure, BNP, renal function and electrolytes. Net IO Since Admission: -6,012.49 mL [12/16/23 1345] Recent Labs  Lab 12/12/23 1234 12/12/23 1831 12/13/23 0643 12/13/23 1503 12/13/23 1756 12/14/23 0408 12/15/23 0510 12/16/23 0215  PROBNP 5,740.0*  --   --   --   --   --   --   --   LATICACIDVEN  --   --   --  1.4 1.5  --   --   --   BUN 15  --  21 27*  --  32* 29* 32*  CREATININE 1.09  --  1.86* 2.02*  --  1.75* 1.34* 1.40*  NA 138  --  134* 137  --  135 134* 133*  K 3.4*  --  5.9* 5.1  --  4.8 4.2 4.8  MG  --  2.0  --  2.0  --  1.8 2.0 1.7   A-fib with RVR  Ventricular tachycardia- Initial EKG with A-fib with mild tachycardia. Had 2 minutes of ventricular tachycardia in the ED and converted to A-fib again Patient was unaware. Per cardiology recommendation was started on amiodarone drip.  Increased metoprolol  succinate from 25 mg daily to 50 mg daily.  10/19, had an episode of sustained VT leading to syncope.  EP has  been reinvolved today. Currently followed by EP. Chronically anticoagulated with Eliquis   Elevated troponin H/o CAD, HLD Troponin was  elevated to 53 .  Likely demand ischemia in the setting of RVR and CHF exacerbation Continue Toprol , Eliquis , Zetia , crestor   Acute respiratory failure with hypoxia Not on supplemental oxygen at home Currently requiring 3 L oxygen.  Secondary to CHF.   Check ambulatory oxygen requirement  AKI Baseline creatinine normal.  Creatinine peaked at 2.02 with IV Lasix .  Gradually improving.  Continue to monitor Recent Labs    05/29/23 0413 05/30/23 0405 05/31/23 0410 11/21/23 0933 12/12/23 1234 12/13/23 0643 12/13/23 1503 12/14/23 0408 12/15/23 0510 12/16/23 0215  BUN 21 23 23 17 15 21  27* 32* 29* 32*  CREATININE 0.92 0.99 1.17 1.91* 1.09 1.86* 2.02* 1.75* 1.34* 1.40*  CO2 28 25 25 23 27  20* 24 25 30 30    HypOkalemia/hypERkalemia Potassium level improved Recent Labs  Lab 12/12/23 1831 12/13/23 0643 12/13/23 1503 12/14/23 0408 12/15/23 0510 12/16/23 0215  K  --  5.9* 5.1 4.8 4.2 4.8  MG 2.0  --  2.0 1.8 2.0 1.7   Abdominal pain Primarily presented with lower abdominal pain, difficulty with voiding.   CT abdomen and pelvis without contrast negative for acute abnormality.   Urinary bladder, ureters kidneys without signs of obstruction.   Flomax  added by cardiology for suspected BPH.  Type 2 diabetes mellitus A1c 5.9 on April 2025 PTA meds-Jardiance  10 mg daily Continue SSI/Accu-Cheks Recent Labs  Lab 12/12/23 2146 12/13/23 1140  GLUCAP 142* 128*   Chronic low back pain Chronic opiate dependence Report multiple falls  PT eval obtained.  Home health PT recommended. PTA meds- Percocet 10/325 every 6 hours as needed continue the same  Constipation Start on scheduled and as needed bowel regimen  Anxiety/depression PTA meds-Celexa  20 mg daily, Xanax  1 mg twice daily as needed Currently on same  Obesity 2 Body mass index is  36.98 kg/m. Patient has been advised to make an attempt to improve diet and exercise patterns to aid in weight loss.    Mobility: Mobility is limited due to back pain.  Seen by PT.  Home with PT recommended PT Orders: Active   PT Follow up Rec: Home Health Pt10/17/2025 1500   Goals of care   Code Status: Full Code     DVT prophylaxis:   apixaban  (ELIQUIS ) tablet 5 mg   Antimicrobials: None Fluid: None Consultants: Cardiology Family Communication: None at bedside  Status: Inpatient Level of care:  Progressive   Patient is from: Home Needs to continue in-hospital care: Ongoing workup Anticipated d/c to: Hopefully home eventually in 1 to 2 days    Diet:  Diet Order             Diet regular Room service appropriate? Yes; Fluid consistency: Thin  Diet effective now  Scheduled Meds:  amiodarone  150 mg Intravenous Once   apixaban   5 mg Oral BID   Chlorhexidine  Gluconate Cloth  6 each Topical Daily   citalopram   20 mg Oral Daily   ezetimibe   10 mg Oral Daily   fluticasone   1 spray Each Nare Daily   fluticasone  furoate-vilanterol  1 puff Inhalation Daily   metoprolol  succinate  50 mg Oral Daily   mexiletine  150 mg Oral Q12H   pantoprazole   20 mg Oral Daily   rosuvastatin   40 mg Oral Daily   senna-docusate  1 tablet Oral QHS   spironolactone   12.5 mg Oral Daily   tamsulosin   0.4 mg Oral Daily   traZODone   50 mg Oral QHS    PRN meds: acetaminophen  **OR** acetaminophen , albuterol , ALPRAZolam , artificial tears, ondansetron  **OR** ondansetron  (ZOFRAN ) IV, mouth rinse, oxyCODONE -acetaminophen  **AND** oxyCODONE , polyethylene glycol, sodium chloride , tiZANidine    Infusions:   amiodarone     Followed by   amiodarone     magnesium  sulfate bolus IVPB 4 g (12/16/23 1213)     Antimicrobials: Anti-infectives (From admission, onward)    None       Objective: Vitals:   12/16/23 0800 12/16/23 1030  BP: 109/85 137/79  Pulse: 70 83   Resp: 20 20  Temp: 98 F (36.7 C) 98.3 F (36.8 C)  SpO2: 95% 96%    Intake/Output Summary (Last 24 hours) at 12/16/2023 1345 Last data filed at 12/16/2023 1245 Gross per 24 hour  Intake 1554 ml  Output 4780 ml  Net -3226 ml   Filed Weights   12/14/23 1430 12/15/23 0501 12/16/23 0729  Weight: 112.3 kg 113.9 kg 113.6 kg   Weight change:  Body mass index is 36.98 kg/m.   Physical Exam: General exam: Pleasant, elderly.  Mild distress due to chronic.  On recliner.  On low-flow oxygen. Skin: No rashes, lesions or ulcers. HEENT: Atraumatic, normocephalic, no obvious bleeding Lungs: Clear to auscultation bilaterally CVS: S1-S2, no murmur GI/Abd: Soft, nontender, distended from obesity, bowel sound present,   CNS: Alert, awake, oriented to place.  Has impaired vision on the right eye at baseline Psychiatry: Upset this morning Extremities: No pedal edema, no calf tenderness,   Data Review: I have personally reviewed the laboratory data and studies available.  F/u labs ordered Unresulted Labs (From admission, onward)     Start     Ordered   12/15/23 0500  Magnesium   Daily,   R     Question:  Specimen collection method  Answer:  Unit=Unit collect   12/14/23 0931   12/15/23 0500  Cooxemetry Panel (carboxy, met, total hgb, O2 sat)  Daily,   R     Question:  Specimen collection method  Answer:  Unit=Unit collect   12/14/23 0931   12/15/23 0500  CBC  Daily,   R     Question:  Specimen collection method  Answer:  Unit=Unit collect   12/14/23 0931   12/14/23 0500  Basic metabolic panel with GFR  Daily,   R     Question:  Specimen collection method  Answer:  Lab=Lab collect   12/13/23 1336            Signed, Chapman Rota, MD [facility] 12/16/2023

## 2023-12-17 DIAGNOSIS — N179 Acute kidney failure, unspecified: Secondary | ICD-10-CM

## 2023-12-17 DIAGNOSIS — I5023 Acute on chronic systolic (congestive) heart failure: Secondary | ICD-10-CM | POA: Diagnosis not present

## 2023-12-17 DIAGNOSIS — I472 Ventricular tachycardia, unspecified: Secondary | ICD-10-CM | POA: Diagnosis not present

## 2023-12-17 LAB — CBC
HCT: 43.1 % (ref 39.0–52.0)
Hemoglobin: 12.7 g/dL — ABNORMAL LOW (ref 13.0–17.0)
MCH: 24.5 pg — ABNORMAL LOW (ref 26.0–34.0)
MCHC: 29.5 g/dL — ABNORMAL LOW (ref 30.0–36.0)
MCV: 83 fL (ref 80.0–100.0)
Platelets: 282 K/uL (ref 150–400)
RBC: 5.19 MIL/uL (ref 4.22–5.81)
RDW: 21.4 % — ABNORMAL HIGH (ref 11.5–15.5)
WBC: 9.1 K/uL (ref 4.0–10.5)
nRBC: 0 % (ref 0.0–0.2)

## 2023-12-17 LAB — BASIC METABOLIC PANEL WITH GFR
Anion gap: 8 (ref 5–15)
BUN: 36 mg/dL — ABNORMAL HIGH (ref 8–23)
CO2: 31 mmol/L (ref 22–32)
Calcium: 9 mg/dL (ref 8.9–10.3)
Chloride: 92 mmol/L — ABNORMAL LOW (ref 98–111)
Creatinine, Ser: 1.56 mg/dL — ABNORMAL HIGH (ref 0.61–1.24)
GFR, Estimated: 48 mL/min — ABNORMAL LOW (ref >60)
Glucose, Bld: 109 mg/dL — ABNORMAL HIGH (ref 70–99)
Potassium: 4.8 mmol/L (ref 3.5–5.1)
Sodium: 131 mmol/L — ABNORMAL LOW (ref 135–145)

## 2023-12-17 LAB — COOXEMETRY PANEL
Carboxyhemoglobin: 1.4 % (ref 0.5–1.5)
Carboxyhemoglobin: 1.8 % — ABNORMAL HIGH (ref 0.5–1.5)
Methemoglobin: <0.7 % (ref 0.0–1.5)
Methemoglobin: <0.7 % (ref 0.0–1.5)
O2 Saturation: 50.9 %
O2 Saturation: 51.7 %
Total hemoglobin: 13.2 g/dL (ref 12.0–16.0)
Total hemoglobin: 13.3 g/dL (ref 12.0–16.0)

## 2023-12-17 LAB — MAGNESIUM: Magnesium: 2.6 mg/dL — ABNORMAL HIGH (ref 1.7–2.4)

## 2023-12-17 MED ORDER — FREE WATER: 250.0000 mL | Freq: Once | Status: DC

## 2023-12-17 MED ORDER — TORSEMIDE 20 MG PO TABS
20.0000 mg | ORAL_TABLET | Freq: Every day | ORAL | Status: DC
  Administered 2023-12-17 – 2023-12-19 (×3): 20 mg via ORAL
  Filled 2023-12-17 (×3): qty 1

## 2023-12-17 MED ORDER — EMPAGLIFLOZIN 10 MG PO TABS
10.0000 mg | ORAL_TABLET | Freq: Every day | ORAL | Status: DC
  Administered 2023-12-17 – 2023-12-28 (×12): 10 mg via ORAL
  Filled 2023-12-17 (×12): qty 1

## 2023-12-17 MED ORDER — ASPIRIN 81 MG PO CHEW: 81.0000 mg | CHEWABLE_TABLET | ORAL | Status: DC

## 2023-12-17 NOTE — NC FL2 (Signed)
 [place]  MEDICAID FL2 LEVEL OF CARE FORM     IDENTIFICATION  Patient Name: Douglas [name] Birthdate: 1955-11-17 Sex: male Admission Date (Current Location): 12/12/2023  [facility] and [place] Number:  Producer, television/film/video and Address:  [facility] [place], [address] [place], [place] [address]      Provider Number: [id number]  Attending Physician Name and Address:  Arlice Reichert, MD  Relative Name and Phone Number:       Current Level of Care: Hospital Recommended Level of Care: Skilled Nursing Facility Prior Approval Number:    Date Approved/Denied:   PASRR Number: Pending  Discharge Plan: SNF    Current Diagnoses: Patient Active Problem List   Diagnosis Date Noted   Acute hypoxemic respiratory failure (HCC) 12/12/2023   Ventricular tachycardia (HCC) 12/12/2023   Frequent falls 11/14/2023   Acquired thrombophilia 05/28/2023   Prolonged QT interval 04/23/2023   Chronic systolic CHF (congestive heart failure) (HCC) 04/23/2023   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 06/09/2020   Moderate persistent asthma without complication 06/09/2020   Stage 3a chronic kidney disease (HCC) 06/09/2019   Biventricular failure (HCC) 06/10/2017   Hyperosmolarity syndrome 06/10/2017   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   Coronary artery disease 06/02/2017   Diverticulosis 06/02/2017   Vertebral compression fracture (HCC) 06/02/2017   Spondylosis of cervical spine 06/02/2017   Coagulopathy 06/02/2017   Allergic rhinitis 12/27/2014   Chronic low back pain 12/27/2014   Gastroesophageal reflux disease without esophagitis 12/27/2014   Generalized anxiety disorder 12/27/2014   Hyperlipidemia 12/27/2014   DJD (degenerative joint disease), lumbosacral 08/26/2013   Atrial fibrillation, chronic (HCC) 08/25/2013   History of Substance abuse 08/25/2013   Essential hypertension 05/01/2010   Cardiomyopathy, ischemic 05/01/2010   ICD  (implantable cardioverter-defibrillator) in place 05/01/2010    Orientation RESPIRATION BLADDER Height & Weight     Self, Time, Situation, Place  O2 Continent Weight: 256 lb 6.3 oz (116.3 kg) Height:  5' 9 (175.3 cm)  BEHAVIORAL SYMPTOMS/MOOD NEUROLOGICAL BOWEL NUTRITION STATUS      Continent Diet (See dc summary)  AMBULATORY STATUS COMMUNICATION OF NEEDS Skin   Limited Assist Verbally Normal                       Personal Care Assistance Level of Assistance  Bathing, Feeding, Dressing Bathing Assistance: Limited assistance Feeding assistance: Limited assistance Dressing Assistance: Limited assistance     Functional Limitations Info  Sight, Hearing, Speech Sight Info: Impaired (R eye blind) Hearing Info: Adequate Speech Info: Adequate    SPECIAL CARE FACTORS FREQUENCY  PT (By licensed PT), OT (By licensed OT)     PT Frequency: 5x weekly OT Frequency: 5x weekly            Contractures      Additional Factors Info  Code Status, Allergies Code Status Info: Full Allergies Info: Xarelto  (rivaroxaban ); Codeine; Lasix  (furosemide ); Vioxx (rofecoxib)           Current Medications (12/17/2023):  This is the current hospital active medication list Current Facility-Administered Medications  Medication Dose Route Frequency Provider Last Rate Last Admin   acetaminophen  (TYLENOL ) tablet 650 mg  650 mg Oral Q6H PRN McLean, Dalton S, MD   650 mg at 12/15/23 1437   Or   acetaminophen  (TYLENOL ) suppository 650 mg  650 mg Rectal Q6H PRN McLean, Dalton S, MD       albuterol  (PROVENTIL ) (2.5 MG/3ML) 0.083%  nebulizer solution 2.5 mg  2.5 mg Nebulization Q4H PRN McLean, Dalton S, MD       ALPRAZolam  (XANAX ) tablet 1 mg  1 mg Oral BID PRN McLean, Dalton S, MD   1 mg at 12/17/23 0250   amiodarone (NEXTERONE PREMIX) 360-4.14 MG/200ML-% (1.8 mg/mL) IV infusion  30 mg/hr Intravenous Continuous Leverne Charlies Helling, PA-C 16.67 mL/hr at 12/17/23 0248 30 mg/hr at 12/17/23 0248    artificial tears ophthalmic solution 1 drop  1 drop Both Eyes PRN Rolan Ezra RAMAN, MD   1 drop at 12/15/23 9096   Chlorhexidine  Gluconate Cloth 2 % PADS 6 each  6 each Topical Daily McLean, Dalton S, MD   6 each at 12/17/23 1012   citalopram  (CELEXA ) tablet 20 mg  20 mg Oral Daily McLean, Dalton S, MD   20 mg at 12/17/23 1010   empagliflozin  (JARDIANCE ) tablet 10 mg  10 mg Oral Daily Hayes Lander L, NP   10 mg at 12/17/23 1011   ezetimibe  (ZETIA ) tablet 10 mg  10 mg Oral Daily McLean, Dalton S, MD   10 mg at 12/17/23 1010   fluticasone  (FLONASE ) 50 MCG/ACT nasal spray 1 spray  1 spray Each Nare Daily Rolan Ezra RAMAN, MD   1 spray at 12/17/23 1013   fluticasone  furoate-vilanterol (BREO ELLIPTA) 200-25 MCG/ACT 1 puff  1 puff Inhalation Daily McLean, Dalton S, MD   1 puff at 12/17/23 9257   metoprolol  succinate (TOPROL -XL) 24 hr tablet 50 mg  50 mg Oral Daily McLean, Dalton S, MD   50 mg at 12/17/23 1010   mexiletine (MEXITIL) capsule 150 mg  150 mg Oral Q12H Ursuy, Renee Lynn, PA-C   150 mg at 12/17/23 1010   ondansetron  (ZOFRAN ) tablet 4 mg  4 mg Oral Q6H PRN McLean, Dalton S, MD   4 mg at 12/13/23 1214   Or   ondansetron  (ZOFRAN ) injection 4 mg  4 mg Intravenous Q6H PRN McLean, Dalton S, MD   4 mg at 12/13/23 1845   Oral care mouth rinse  15 mL Mouth Rinse PRN Rolan Ezra RAMAN, MD       oxyCODONE -acetaminophen  (PERCOCET/ROXICET) 5-325 MG per tablet 1 tablet  1 tablet Oral Q6H PRN McLean, Dalton S, MD   1 tablet at 12/17/23 0250   And   oxyCODONE  (Oxy IR/ROXICODONE ) immediate release tablet 5 mg  5 mg Oral Q6H PRN McLean, Dalton S, MD   5 mg at 12/15/23 1437   pantoprazole  (PROTONIX ) EC tablet 20 mg  20 mg Oral Daily McLean, Dalton S, MD   20 mg at 12/17/23 1010   polyethylene glycol (MIRALAX  / GLYCOLAX ) packet 17 g  17 g Oral Daily PRN McLean, Dalton S, MD   17 g at 12/15/23 1437   rosuvastatin  (CRESTOR ) tablet 40 mg  40 mg Oral Daily McLean, Dalton S, MD   40 mg at 12/17/23 1011   senna-docusate  (Senokot-S) tablet 1 tablet  1 tablet Oral QHS Dahal, Binaya, MD   1 tablet at 12/16/23 2112   sodium chloride  (OCEAN) 0.65 % nasal spray 1 spray  1 spray Each Nare PRN Rolan Ezra RAMAN, MD   1 spray at 12/15/23 9096   tamsulosin  (FLOMAX ) capsule 0.4 mg  0.4 mg Oral Daily McLean, Dalton S, MD   0.4 mg at 12/17/23 1010   tiZANidine  (ZANAFLEX ) tablet 4 mg  4 mg Oral Q8H PRN McLean, Dalton S, MD   4 mg at 12/16/23 2112   torsemide  (DEMADEX ) tablet  20 mg  20 mg Oral Daily Diaz, Alma L, NP   20 mg at 12/17/23 1010   traZODone  (DESYREL ) tablet 50 mg  50 mg Oral QHS McLean, Dalton S, MD   50 mg at 12/16/23 2112     Discharge Medications: Please see discharge summary for a list of discharge medications.  Relevant Imaging Results:  Relevant Lab Results:   Additional Information SSN: [id number]  Arlana JINNY Moats, LCSWA

## 2023-12-17 NOTE — Progress Notes (Addendum)
 Patient Name: Douglas [name] Date of Encounter: 12/17/2023  Primary Cardiologist: Danelle Birmingham, MD Electrophysiologist: None  Interval Summary   The patient is doing well today. Fatigued but denies chest pain, shortness of breath, or any new concerns.  Vital Signs    Vitals:   12/17/23 0245 12/17/23 0334 12/17/23 0400 12/17/23 0719  BP: 95/73  98/71 99/70  Pulse: 67  65   Resp:    17  Temp: 97.8 F (36.6 C)  97.8 F (36.6 C) (!) 97.5 F (36.4 C)  TempSrc: Axillary  Axillary Oral  SpO2: 96%  97%   Weight:  116.3 kg    Height:        Intake/Output Summary (Last 24 hours) at 12/17/2023 0739 Last data filed at 12/16/2023 2100 Gross per 24 hour  Intake 1194 ml  Output 3300 ml  Net -2106 ml   Filed Weights   12/15/23 0501 12/16/23 0729 12/17/23 0334  Weight: 113.9 kg 113.6 kg 116.3 kg    Physical Exam    GEN- NAD, Alert and oriented. Chronically ill appearing. Lungs- normal work of breathing Cardiac- Irregularly irregular rate and rhythm, no murmurs, rubs or gallops GI- soft, NT, ND, + BS Extremities- no clubbing or cyanosis. No edema  Telemetry    Atrial fibrillation (permanent) with ventricular rates 60s-70s. No recurrent VT. (personally reviewed)  Hospital Course    Douglas [name] is a 68 y.o. male with history of CAD (s/p remote PCI and CABG), ICM, chronic CHF, VT s/p ICD, permanent afib, hypertension, hyperlipidemia, asthma admitted for acute CHF and VT.  Assessment & Plan    Ventricular tachycardia Patient admitted with GI and urinary symptoms, found to have decompensated CHF, developed VT successfully treated with ATP. Patient started on Amiodarone infusion [facility] Upon transfer, device report showed at least 1 episode of VT successfully treated with VT. Initially VT suspected secondary to CHF. IV Amiodarone transitioned to PO on 12/14/23 and patient resumed on low dose Toprol . VT initially attributed to CHF and medication adjustments.  However, on 10/19 patient had syncope with recurrent fast VT ( , in VF zone) that was successfully terminated with ATP while charging. IV Amiodarone resumed. Mexiletine 150mg  BID started. Given history of CAD, LHC recommended. No recurrent VT since 10/19. LHC recommended by EP, timing TBD by cardiology (tentatively 10/22). Continue IV Amiodarone through LHC, ideally until ~10g load. Continue Mexiletine 150mg  BID. Continue Toprol  XL per HF team. Titrate up as able (home dose 200mg ).   CAD Patient with hx remote PCI and CABG had 4/4 patent grafts by LHC at [facility] in 2019 (though did have 40-60% in-graft stenosis SVT to OM-2. LHC as above per cardiology given recurrent VT.  Permanent atrial fibrillation Stable telemetry/ventricular rates. Eliquis  on hold pending LHC.    For questions or updates, please contact [facility] Please consult [email or web address] for contact info under     Signed, Artist Pouch, PA-C  12/17/2023, 7:39 AM    I have seen, examined the patient, and reviewed the above assessment and plan.    Interval:  No acute overnight events. Patient reports feeling relatively well. No new or acute complaints.   No ventricular arrhythmias noted on telemetry.  General: Well developed, in no acute distress.  Neck: No JVD.  Cardiac: Normal rate, regular rhythm.  Resp: Normal work of breathing.  Ext: No edema.  Neuro: No gross focal deficits.  Psych: Normal affect.   Assessment:  Douglas [name] is a 68 year old male with past  medical history notable for chronic systolic heart failure in the setting of ischemic cardiomyopathy status post ICD.  He was admitted with acute decompensated heart failure.  Hospital course has been complicated by episodes of nonsustained VT which necessitated ATP therapies.  1 such occasion resulted in a episode of syncope.  The syncopal episode appears to have started with polymorphic ventricular tachycardia before organizing into a more fast  monomorphic VT.  This episode occurred while he was having a bowel movement.   Problem List:  Ventricular tachycardia Permanent atrial fibrillation Acute on chronic systolic heart failure Ischemic cardiomyopathy Status post ICD   Plan:  - Recommend cardiac catheterization to rule out any ischemic contribution given known history of CAD and polymorphic nature to the beginning of his ventricular tachycardia with onset while having a bowel movement.  Tentatively planned for tomorrow. - Transition to oral amiodarone for 100 mg twice daily after completing 24 hours of IV therapy. - Continue mexiletine 150 mg twice daily. - Continue metoprolol  XL 50 mg daily and uptitrate as able from an HF perspective.  He had previously been on 200 mg once daily. - Continue optimization of his heart failure status.  Appreciate assistance from HF team.   Fonda Kitty, MD 12/17/2023 11:10 PM

## 2023-12-17 NOTE — Progress Notes (Addendum)
 Patient ID: Douglas [name], male   DOB: 1956-01-17, 68 y.o.   MRN: [id number]     Advanced Heart Failure Rounding Note  Cardiologist: Danelle Birmingham, MD   Chief Complaint: CHF  Subjective:    10/19 patient was ambulating to the bathroom with his walker. RN witnessed him fall face first. He was briefly unconscious. He does not recall the event. CT head okay. On tele review: episodes of monomorphic VT noted with what appears to be ATP/? Shock.   Received 80 mg lasix  IV yesterday. Had 3.3L UOP. Up 6lbs? CVP 12 this am. CO-OX 51%?.  Seen by EP yesterday, now back on amiodarone gtt and on mexiletine.   Feels ok this morning. Denies CP/SOB.   Objective:   Weight Range: 116.3 kg Body mass index is 37.86 kg/m.   Vital Signs:   Temp:  [97.5 F (36.4 C)-98.3 F (36.8 C)] 97.5 F (36.4 C) (10/21 0719) Pulse Rate:  [65-83] 65 (10/21 0400) Resp:  [17-20] 17 (10/21 0719) BP: (95-137)/(64-85) 99/70 (10/21 0719) SpO2:  [93 %-97 %] 97 % (10/21 0400) Weight:  [116.3 kg] 116.3 kg (10/21 0334) Last BM Date : 12/13/23  Weight change: Filed Weights   12/15/23 0501 12/16/23 0729 12/17/23 0334  Weight: 113.9 kg 113.6 kg 116.3 kg    Intake/Output:   Intake/Output Summary (Last 24 hours) at 12/17/2023 0758 Last data filed at 12/16/2023 2100 Gross per 24 hour  Intake 1194 ml  Output 3300 ml  Net -2106 ml      Physical Exam    General:  elderly appearing.  No respiratory difficulty Neck: JVD ~7 cm.  Cor: Regular rate & irregular rhythm. No murmurs. Lungs: clear Extremities: no edema  Neuro: alert & oriented x 2-3. Affect pleasant.   Telemetry   A fib 50s-60s (Personally reviewed)    Labs    CBC Recent Labs    12/16/23 0215 12/17/23 0256  WBC 8.2 9.1  HGB 12.5* 12.7*  HCT 42.8 43.1  MCV 83.4 83.0  PLT 258 282   Basic Metabolic Panel Recent Labs    89/79/74 0215 12/17/23 0256  NA 133* 131*  K 4.8 4.8  CL 93* 92*  CO2 30 31  GLUCOSE 123* 109*  BUN 32* 36*   CREATININE 1.40* 1.56*  CALCIUM  8.7* 9.0  MG 1.7 2.6*   Liver Function Tests No results for input(s): AST, ALT, ALKPHOS, BILITOT, PROT, ALBUMIN in the last 72 hours.  No results for input(s): LIPASE, AMYLASE in the last 72 hours.  Cardiac Enzymes No results for input(s): CKTOTAL, CKMB, CKMBINDEX, TROPONINI in the last 72 hours.  BNP: BNP (last 3 results) Recent Labs    04/23/23 1840 05/28/23 0322 05/29/23 0413  BNP 253.0* 557.0* 672.0*    ProBNP (last 3 results) Recent Labs    12/12/23 1234  PROBNP 5,740.0*     D-Dimer No results for input(s): DDIMER in the last 72 hours. Hemoglobin A1C No results for input(s): HGBA1C in the last 72 hours. Fasting Lipid Panel No results for input(s): CHOL, HDL, LDLCALC, TRIG, CHOLHDL, LDLDIRECT in the last 72 hours. Thyroid Function Tests No results for input(s): TSH, T4TOTAL, T3FREE, THYROIDAB in the last 72 hours.  Invalid input(s): FREET3  Other results:  Imaging  No results found.   Medications:   Scheduled Medications:  aspirin   81 mg Oral Pre-Cath   Chlorhexidine  Gluconate Cloth  6 each Topical Daily   citalopram   20 mg Oral Daily   ezetimibe   10 mg Oral  Daily   fluticasone   1 spray Each Nare Daily   fluticasone  furoate-vilanterol  1 puff Inhalation Daily   free water  250 mL Oral Once   metoprolol  succinate  50 mg Oral Daily   mexiletine  150 mg Oral Q12H   pantoprazole   20 mg Oral Daily   rosuvastatin   40 mg Oral Daily   senna-docusate  1 tablet Oral QHS   spironolactone   12.5 mg Oral Daily   tamsulosin   0.4 mg Oral Daily   traZODone   50 mg Oral QHS    Infusions:  amiodarone 30 mg/hr (12/17/23 0248)     PRN Medications: acetaminophen  **OR** acetaminophen , albuterol , ALPRAZolam , artificial tears, ondansetron  **OR** ondansetron  (ZOFRAN ) IV, mouth rinse, oxyCODONE -acetaminophen  **AND** oxyCODONE , polyethylene glycol, sodium chloride ,  tiZANidine  Assessment/Plan   1. Acute on chronic systolic CHF: Ischemic cardiomyopathy.  [employer] ICD.  He has been on minimal GDMT at home, apparently limited by low BP.  Patient has a long-standing cardiomyopathy, echoes with EF in the 20-25% range since at least 2015.  By Dr. Rolan review of echo this admission, EF 25% with moderate RV dysfunction and dilated IVC.  - CO-OX 51%. Repeat.  - CVP 12?SABRA Weight up 6lbs. Will hold off on further IV diuretics at this time with slight bump in SCr. Will restart home Torsemide . Optivol low on device review.  - Continue Toprol  XL 50 mg daily, probably would not go higher than this.  He used to be on 200 mg daily but was lightheaded with falls on this dose.  - Restart Jardiance .  - Hold spiro 12.5 mg daily with slight bump in SCr (did not increase today d/t K 4.8)  2. VT: Patient has permanent AF but was noted to have a run of VT this admission treated with ATP (dual tachycardia).  Seen by EP. He was stated on amiodarone, now po.  Trigger for VT may have been drastic drop recently in Toprol  XL dose 200 mg daily => 12.5 daily.   - Another run of VT 10/19 w/ syncopal event, looks like treated with ATP  - Continue Toprol  XL 50 mg daily.  Will try to keep this as his maintenance dose.  Do not think he will tolerate going back to 200 mg daily.  - Now on IV amiodarone. EP following. Mexilitine added.  - Plan for [facility] tomorrow (deferred today as he got eliquis ).  Informed Consent   Shared Decision Making/Informed Consent The risks [stroke (1 in 1000), death (1 in 1000), kidney failure [usually temporary] (1 in 500), bleeding (1 in 200), allergic reaction [possibly serious] (1 in 200)], benefits (diagnostic support and management of coronary artery disease) and alternatives of a cardiac catheterization were discussed in detail with Mr. Vonzell and he is willing to proceed.      3. Atrial fibrillation: Permanent.  He is currently rate-controlled.  - Continue  apixaban , holding for cath.  - Toprol  XL as above  4. AKI: Creatinine jumped 1.09 => 1.86 => 2.02 with elevated K.  He received Lokelma.  Foley placed, 500 cc urine out and abdominal pain resolved.  ?Obstruction by BPH. Creatinine 1.56 today. AKI may have been obstructive.  - Now on Flomax  for suspected BPH.  - No more IV lasix  today. Restart home diuretic for now.   5. CAD: H/o CABG 2002.  Last cath in 2013 with occluded LAD and RCA, 2 occluded distal OMs.  No chest pain.  Troponin minimally elevated with no trend, suspect demand ischemia.  - On  apixaban  with stable CAD so no ASA.  - Continue statin.    Length of Stay: 5  Douglas LITTIE Coe, NP  12/17/2023, 7:58 AM  Advanced Heart Failure Team Pager [phone] (M-F; 7a - 5p)  Please contact [facility] for night-coverage after hours (5p -7a ) and weekends on [email or web address]  Patient seen and examined with the above-signed Advanced Practice Provider and/or Housestaff. I personally reviewed laboratory data, imaging studies and relevant notes. I independently examined the patient and formulated the important aspects of the plan. I have edited the note to reflect any of my changes or salient points. I have personally discussed the plan with the patient and/or family.  Now on amio gtt and mexilitene. No further VT. Plan was for cath today but Eliquis  not stopped so deferred until tomorrow.   Multiple complaints but denies CP or SOB  General:  Sitting in chair  No resp difficulty HEENT: right eye abnormal otherwise nornal Neck: supple. JVP 10 Rnm:pmmzh Lungs: clear Abdomen: obese soft, nontender, nondistended. Good bowel sounds. Extremities: no cyanosis, clubbing, rash, 1+ edema Neuro: alert & orientedx3, cranial nerves grossly intact. moves all 4 extremities w/o difficulty. Affect pleasant  Case d/w EP. Plan R/L cath tomorrow to evaluate for high-grade lesion as trigger for VT.   Still with volume on board. Will not diurese aggressively  pre-cath given CKD.   Keep K > 4.0 Mg > 2.0   Douglas Fuel, MD  3:24 PM

## 2023-12-17 NOTE — Progress Notes (Signed)
 Cath for today canceled due to Eliquis .   Rescheduled for tomorrow am. Hold Eliquis .   Toribio Fuel, MD

## 2023-12-17 NOTE — Progress Notes (Signed)
 RN notified at [phone] by Delon Decent, RN via secure chat that patient will be rescheduled for his heart cath tomorrow because he is still on eliquis . Patient notified. Pharmacy called and message sent to retime precath meds for tomorrow morning.

## 2023-12-17 NOTE — TOC Progression Note (Addendum)
 Transition of Care [facility]) - Progression Note    Patient Details  Name: Douglas [name] MRN: [id number] Date of Birth: [birth year]  Transition of Care [facility]) CM/SW Contact  [name] Phone Number: [phone] 12/17/2023, 10:16 AM  Clinical Narrative:   10:14 AM- HF CSW received a meesage to contact Izetta Charles from [facility]PS. CSW called and spoke with Izetta over the phone. Izetta supports the patient for adult services. Izetta transport the patient to and from medical appointments. Izetta stated that the patient does not have any family support, and relies on his neighbor for assistance. CSW will update Katie as the patient gets closer to being medically ready for dc.   HF CSW met with patient at bedside to discuss SNF process. Patient is agreeable to being faxed out.   HF CSW/CM will continue to follow and monitor for dc readiness.     Expected Discharge Plan: Skilled Nursing Facility Barriers to Discharge: Continued Medical Work up               Expected Discharge Plan and Services   Discharge Planning Services: CM Consult Post Acute Care Choice: Skilled Nursing Facility Living arrangements for the past 2 months: Single Family Home                                       Social Drivers of Health (SDOH) Interventions SDOH Screenings   Food Insecurity: No Food Insecurity (12/13/2023)  Housing: Low Risk  (12/13/2023)  Transportation Needs: No Transportation Needs (12/13/2023)  Utilities: Not At Risk (12/13/2023)  Depression (PHQ2-9): Medium Risk (11/14/2023)  Social Connections: Moderately Isolated (12/13/2023)  Tobacco Use: Low Risk  (12/13/2023)    Readmission Risk Interventions    05/31/2023   11:35 AM 05/30/2023   10:20 AM  Readmission Risk Prevention Plan  Transportation Screening Complete Complete  Home Care Screening Complete Complete  Medication Review (RN CM) Complete Complete

## 2023-12-17 NOTE — Progress Notes (Signed)
 PROGRESS NOTE  Douglas [name]  DOB: 05/21/1955  PCP: Mancil Pfeiffer, [place] FMW:[id number]  DOA: 12/12/2023  LOS: 5 days  Hospital Day: 6  Subjective: Patient was seen and examined this morning. Sitting up at the edge of the bed.  Not in distress.   Upset that he did not get breakfast on time. Denies any shortness of breath or chest pain Afebrile, blood pressure in the low 100s and 90s, breathing on 2 L oxygen Labs this morning with sodium 131, BUN/creatinine 36/1.56  Brief narrative: Douglas [name] is a 68 y.o. male with PMH significant for obesity, DM2, HTN, HLD, A-fib on Eliquis , CAD, ischemic cardiopathy, CHF s/p AICD, anemia, asthma, opiate dependence 10/16, patient presented to ED at [facility] with complaint of 2 days of nausea, vomiting, abdominal pain, inability to urinate.  In the ED, patient is afebrile, heart rate more than 110, blood pressure in 150s, O2 sat 86% on room air, required 4 L oxygen for O2 sat over 90% Labs with proBNP elevated to over 5700, renal function normal, WBC count normal. RVP negative Chest x-ray unremarkable. Initial EKG with A-fib at 96 bpm CT abdomen and pelvis did not show any acute intra-abdominal or pelvic pathology, hydronephrosis or nephrolithiasis.  Ureters and urinary bladder unremarkable.  Sigmoid diverticulosis Admitted to [facility]  While in the ED, telemetry showed episodes of sustained V. tach but converted to A-fib spontaneously. Patient remained hemodynamically stable. Cardiology was consulted and recommended initiation of amiodarone drip, interrogation of pacemaker, diuresis and transferred to [facility]. Heart failure service is following.  Assessment and plan: Acute exacerbation of chronic systolic CHF Hypertension Presented with abnormal symptoms and respiratory failure  proBNP was elevated to over 5700.  Per cardiology note, Optivol suggests ongoing fluid accumulation since July Most recent echo from April 2024  with EF 35 to 40%.  Pending repeat echo PTA meds- Toprol  25 mg daily, Jardiance  10 mg daily, Aldactone , torsemide  20 mg daily Heart failure services following Net negative balance more than 4.6 L.  Noted plan to give 1 dose of Lasix  IV 80 mg today Defer to cardiology for GDMT. Currently on Toprol , Aldactone  Continue to monitor for daily intake output, weight, blood pressure, BNP, renal function and electrolytes. Net IO Since Admission: -7,692.49 mL [12/17/23 1400] Recent Labs  Lab 12/12/23 1234 12/12/23 1831 12/13/23 1503 12/13/23 1756 12/14/23 0408 12/15/23 0510 12/16/23 0215 12/17/23 0256  PROBNP 5,740.0*  --   --   --   --   --   --   --   LATICACIDVEN  --   --  1.4 1.5  --   --   --   --   BUN 15   < > 27*  --  32* 29* 32* 36*  CREATININE 1.09   < > 2.02*  --  1.75* 1.34* 1.40* 1.56*  NA 138   < > 137  --  135 134* 133* 131*  K 3.4*   < > 5.1  --  4.8 4.2 4.8 4.8  MG  --    < > 2.0  --  1.8 2.0 1.7 2.6*   < > = values in this interval not displayed.   A-fib with RVR  Permanent A-fib Recurrent ventricular tachycardia Initial EKG with A-fib with mild tachycardia. While in the ED, patient had 2 minutes of ventricular tachycardia and converted to A-fib again. 10/19, had an episode of sustained VT leading to syncope.  Seen by EP.  Recommended LHC/bypass angiography to  rule out coronary ischemia as etiology of recurrent VT.  Planned for tomorrow. Currently on IV amiodarone drip, mexiletine, Toprol  Chronically anticoagulated with Eliquis .  Currently on hold for LHC tomorrow  Elevated troponin H/o CAD s/p PCI/CABG HLD Troponin was  elevated to 53 .  Likely demand ischemia in the setting of RVR and CHF exacerbation Continue Toprol , Eliquis , Zetia , crestor   Acute respiratory failure with hypoxia Not on supplemental oxygen at home Currently requiring 3 L oxygen.  Secondary to CHF.   Check ambulatory oxygen requirement.  AKI Baseline creatinine normal.  Creatinine peaked at  2.02 with IV Lasix .  Gradually improving.  Continue to monitor Recent Labs    05/30/23 0405 05/31/23 0410 11/21/23 0933 12/12/23 1234 12/13/23 0643 12/13/23 1503 12/14/23 0408 12/15/23 0510 12/16/23 0215 12/17/23 0256  BUN 23 23 17 15 21  27* 32* 29* 32* 36*  CREATININE 0.99 1.17 1.91* 1.09 1.86* 2.02* 1.75* 1.34* 1.40* 1.56*  CO2 25 25 23 27  20* 24 25 30 30 31    HypOkalemia/hypERkalemia Potassium level improved Recent Labs  Lab 12/13/23 1503 12/14/23 0408 12/15/23 0510 12/16/23 0215 12/17/23 0256  K 5.1 4.8 4.2 4.8 4.8  MG 2.0 1.8 2.0 1.7 2.6*   Abdominal pain Primarily presented with lower abdominal pain, difficulty with voiding.   CT abdomen and pelvis without contrast negative for acute abnormality.   Urinary bladder, ureters kidneys without signs of obstruction.   Flomax  added by cardiology for suspected BPH.  Type 2 diabetes mellitus A1c 5.9 on April 2025 PTA meds-Jardiance  10 mg daily Continue SSI/Accu-Cheks Recent Labs  Lab 12/12/23 2146 12/13/23 1140  GLUCAP 142* 128*   Chronic low back pain Chronic opiate dependence Report multiple falls  PT eval obtained.  Home health PT recommended. PTA meds- Percocet 10/325 every 6 hours as needed continue the same  Constipation Start on scheduled and as needed bowel regimen  Anxiety/depression PTA meds-Celexa  20 mg daily, Xanax  1 mg twice daily as needed Currently on same  Obesity 2 Body mass index is 37.86 kg/m. Patient has been advised to make an attempt to improve diet and exercise patterns to aid in weight loss.    Mobility: Mobility is limited due to back pain.  Seen by PT.  Home with PT recommended PT Orders: Active   PT Follow up Rec: Home Health Pt10/20/2025 1600   Goals of care   Code Status: Full Code     DVT prophylaxis:     Antimicrobials: None Fluid: None Consultants: Cardiology Family Communication: None at bedside  Status: Inpatient Level of care:  Progressive    Patient is from: Home Needs to continue in-hospital care: Ongoing workup Anticipated d/c to: Hopefully home eventually in 1 to 2 days    Diet:  Diet Order             Diet Heart Room service appropriate? Yes; Fluid consistency: Thin; Fluid restriction: 1800 mL Fluid  Diet effective now                   Scheduled Meds:  Chlorhexidine  Gluconate Cloth  6 each Topical Daily   citalopram   20 mg Oral Daily   empagliflozin   10 mg Oral Daily   ezetimibe   10 mg Oral Daily   fluticasone   1 spray Each Nare Daily   fluticasone  furoate-vilanterol  1 puff Inhalation Daily   metoprolol  succinate  50 mg Oral Daily   mexiletine  150 mg Oral Q12H   pantoprazole   20 mg Oral Daily  rosuvastatin   40 mg Oral Daily   senna-docusate  1 tablet Oral QHS   tamsulosin   0.4 mg Oral Daily   torsemide   20 mg Oral Daily   traZODone   50 mg Oral QHS    PRN meds: acetaminophen  **OR** acetaminophen , albuterol , ALPRAZolam , artificial tears, ondansetron  **OR** ondansetron  (ZOFRAN ) IV, mouth rinse, oxyCODONE -acetaminophen  **AND** oxyCODONE , polyethylene glycol, sodium chloride , tiZANidine    Infusions:   amiodarone 30 mg/hr (12/17/23 0248)     Antimicrobials: Anti-infectives (From admission, onward)    None       Objective: Vitals:   12/17/23 0400 12/17/23 0719  BP: 98/71 99/70  Pulse: 65   Resp:  17  Temp: 97.8 F (36.6 C) (!) 97.5 F (36.4 C)  SpO2: 97%     Intake/Output Summary (Last 24 hours) at 12/17/2023 1400 Last data filed at 12/17/2023 0900 Gross per 24 hour  Intake 120 ml  Output 1800 ml  Net -1680 ml   Filed Weights   12/15/23 0501 12/16/23 0729 12/17/23 0334  Weight: 113.9 kg 113.6 kg 116.3 kg   Weight change:  Body mass index is 37.86 kg/m.   Physical Exam: General exam: Pleasant, elderly.  No acute distress Skin: No rashes, lesions or ulcers. HEENT: Atraumatic, normocephalic, no obvious bleeding Lungs: Clear to auscultation bilaterally CVS: S1-S2, no  murmur GI/Abd: Soft, nontender, distended from obesity, bowel sound present,   CNS: Alert, awake, oriented to place.  Has impaired vision on the right eye at baseline Psychiatry: Upset this morning Extremities: No pedal edema, no calf tenderness,   Data Review: I have personally reviewed the laboratory data and studies available.  F/u labs ordered Unresulted Labs (From admission, onward)     Start     Ordered   12/15/23 0500  Magnesium   Daily,   R     Question:  Specimen collection method  Answer:  Unit=Unit collect   12/14/23 0931   12/15/23 0500  Cooxemetry Panel (carboxy, met, total hgb, O2 sat)  Daily,   R     Question:  Specimen collection method  Answer:  Unit=Unit collect   12/14/23 0931   12/15/23 0500  CBC  Daily,   R     Question:  Specimen collection method  Answer:  Unit=Unit collect   12/14/23 0931   12/14/23 0500  Basic metabolic panel with GFR  Daily,   R     Question:  Specimen collection method  Answer:  Lab=Lab collect   12/13/23 1336            Signed, Chapman Rota, MD [facility] 12/17/2023

## 2023-12-18 ENCOUNTER — Encounter (HOSPITAL_COMMUNITY)
Admission: EM | Disposition: A | Discharge status: Home / Self Care | Payer: Self-pay | Source: Home / Self Care | Attending: Internal Medicine

## 2023-12-18 DIAGNOSIS — I2510 Atherosclerotic heart disease of native coronary artery without angina pectoris: Secondary | ICD-10-CM | POA: Diagnosis not present

## 2023-12-18 DIAGNOSIS — Z7189 Other specified counseling: Secondary | ICD-10-CM | POA: Diagnosis not present

## 2023-12-18 DIAGNOSIS — I5023 Acute on chronic systolic (congestive) heart failure: Secondary | ICD-10-CM | POA: Diagnosis not present

## 2023-12-18 DIAGNOSIS — I472 Ventricular tachycardia, unspecified: Secondary | ICD-10-CM | POA: Diagnosis not present

## 2023-12-18 DIAGNOSIS — Z515 Encounter for palliative care: Secondary | ICD-10-CM | POA: Diagnosis not present

## 2023-12-18 DIAGNOSIS — I509 Heart failure, unspecified: Secondary | ICD-10-CM | POA: Diagnosis not present

## 2023-12-18 HISTORY — PX: RIGHT/LEFT HEART CATH AND CORONARY/GRAFT ANGIOGRAPHY: CATH118267

## 2023-12-18 LAB — BASIC METABOLIC PANEL WITH GFR
Anion gap: 11 (ref 5–15)
BUN: 46 mg/dL — ABNORMAL HIGH (ref 8–23)
CO2: 29 mmol/L (ref 22–32)
Calcium: 8.9 mg/dL (ref 8.9–10.3)
Chloride: 91 mmol/L — ABNORMAL LOW (ref 98–111)
Creatinine, Ser: 1.63 mg/dL — ABNORMAL HIGH (ref 0.61–1.24)
GFR, Estimated: 46 mL/min — ABNORMAL LOW (ref >60)
Glucose, Bld: 117 mg/dL — ABNORMAL HIGH (ref 70–99)
Potassium: 5.1 mmol/L (ref 3.5–5.1)
Sodium: 131 mmol/L — ABNORMAL LOW (ref 135–145)

## 2023-12-18 LAB — CBC
HCT: 42.4 % (ref 39.0–52.0)
Hemoglobin: 12.7 g/dL — ABNORMAL LOW (ref 13.0–17.0)
MCH: 24.7 pg — ABNORMAL LOW (ref 26.0–34.0)
MCHC: 30 g/dL (ref 30.0–36.0)
MCV: 82.3 fL (ref 80.0–100.0)
Platelets: 243 K/uL (ref 150–400)
RBC: 5.15 MIL/uL (ref 4.22–5.81)
RDW: 21.6 % — ABNORMAL HIGH (ref 11.5–15.5)
WBC: 6.9 K/uL (ref 4.0–10.5)
nRBC: 0 % (ref 0.0–0.2)

## 2023-12-18 LAB — POCT I-STAT EG7
Acid-Base Excess: 7 mmol/L — ABNORMAL HIGH (ref 0.0–2.0)
Acid-Base Excess: 8 mmol/L — ABNORMAL HIGH (ref 0.0–2.0)
Bicarbonate: 33.3 mmol/L — ABNORMAL HIGH (ref 20.0–28.0)
Bicarbonate: 33.8 mmol/L — ABNORMAL HIGH (ref 20.0–28.0)
Calcium, Ion: 1.14 mmol/L — ABNORMAL LOW (ref 1.15–1.40)
Calcium, Ion: 1.18 mmol/L (ref 1.15–1.40)
HCT: 44 % (ref 39.0–52.0)
HCT: 45 % (ref 39.0–52.0)
Hemoglobin: 15 g/dL (ref 13.0–17.0)
Hemoglobin: 15.3 g/dL (ref 13.0–17.0)
O2 Saturation: 43 %
O2 Saturation: 46 %
Potassium: 4.1 mmol/L (ref 3.5–5.1)
Potassium: 4.2 mmol/L (ref 3.5–5.1)
Sodium: 135 mmol/L (ref 135–145)
Sodium: 135 mmol/L (ref 135–145)
TCO2: 35 mmol/L — ABNORMAL HIGH (ref 22–32)
TCO2: 35 mmol/L — ABNORMAL HIGH (ref 22–32)
pCO2, Ven: 50.3 mmHg (ref 44–60)
pCO2, Ven: 51 mmHg (ref 44–60)
pH, Ven: 7.423 (ref 7.25–7.43)
pH, Ven: 7.435 — ABNORMAL HIGH (ref 7.25–7.43)
pO2, Ven: 24 mmHg — CL (ref 32–45)
pO2, Ven: 25 mmHg — CL (ref 32–45)

## 2023-12-18 LAB — COOXEMETRY PANEL
Carboxyhemoglobin: 1.9 % — ABNORMAL HIGH (ref 0.5–1.5)
Methemoglobin: 0.9 % (ref 0.0–1.5)
O2 Saturation: 50.3 %
Total hemoglobin: 13.5 g/dL (ref 12.0–16.0)

## 2023-12-18 LAB — POCT I-STAT 7, (LYTES, BLD GAS, ICA,H+H)
Acid-base deficit: 3 mmol/L — ABNORMAL HIGH (ref 0.0–2.0)
Bicarbonate: 20.5 mmol/L (ref 20.0–28.0)
Calcium, Ion: 0.64 mmol/L — CL (ref 1.15–1.40)
HCT: 32 % — ABNORMAL LOW (ref 39.0–52.0)
Hemoglobin: 10.9 g/dL — ABNORMAL LOW (ref 13.0–17.0)
O2 Saturation: 92 %
Potassium: 2.7 mmol/L — CL (ref 3.5–5.1)
Sodium: 148 mmol/L — ABNORMAL HIGH (ref 135–145)
TCO2: 21 mmol/L — ABNORMAL LOW (ref 22–32)
pCO2 arterial: 30.3 mmHg — ABNORMAL LOW (ref 32–48)
pH, Arterial: 7.439 (ref 7.35–7.45)
pO2, Arterial: 61 mmHg — ABNORMAL LOW (ref 83–108)

## 2023-12-18 LAB — MAGNESIUM: Magnesium: 2.4 mg/dL (ref 1.7–2.4)

## 2023-12-18 SURGERY — RIGHT/LEFT HEART CATH AND CORONARY/GRAFT ANGIOGRAPHY
Anesthesia: LOCAL

## 2023-12-18 MED ORDER — SIMETHICONE 80 MG PO CHEW
80.0000 mg | CHEWABLE_TABLET | Freq: Once | ORAL | Status: AC
  Administered 2023-12-18: 80 mg via ORAL
  Filled 2023-12-18: qty 1

## 2023-12-18 MED ORDER — SODIUM CHLORIDE 0.9 % IV SOLN: 250.0000 mL | INTRAVENOUS | Status: AC | PRN

## 2023-12-18 MED ORDER — FENTANYL CITRATE (PF) 100 MCG/2ML IJ SOLN
INTRAMUSCULAR | Status: AC
  Filled 2023-12-18: qty 2

## 2023-12-18 MED ORDER — VERAPAMIL HCL 2.5 MG/ML IV SOLN
INTRAVENOUS | Status: AC
  Filled 2023-12-18: qty 2

## 2023-12-18 MED ORDER — MIDAZOLAM HCL 2 MG/2ML IJ SOLN
INTRAMUSCULAR | Status: AC
  Filled 2023-12-18: qty 2

## 2023-12-18 MED ORDER — BISACODYL 10 MG RE SUPP: 10.0000 mg | Freq: Once | RECTAL | Status: DC

## 2023-12-18 MED ORDER — HEPARIN (PORCINE) IN NACL 1000-0.9 UT/500ML-% IV SOLN
INTRAVENOUS | Status: DC | PRN
  Administered 2023-12-18: 1000 mL

## 2023-12-18 MED ORDER — ALUM & MAG HYDROXIDE-SIMETH 200-200-20 MG/5ML PO SUSP
30.0000 mL | ORAL | Status: DC | PRN
  Administered 2023-12-18 – 2023-12-27 (×6): 30 mL via ORAL
  Filled 2023-12-18 (×6): qty 30

## 2023-12-18 MED ORDER — LIDOCAINE HCL (PF) 1 % IJ SOLN
INTRAMUSCULAR | Status: AC
  Filled 2023-12-18: qty 30

## 2023-12-18 MED ORDER — MIDAZOLAM HCL (PF) 2 MG/2ML IJ SOLN
INTRAMUSCULAR | Status: DC | PRN
  Administered 2023-12-18: 1 mg via INTRAVENOUS

## 2023-12-18 MED ORDER — HYDRALAZINE HCL 20 MG/ML IJ SOLN: 10.0000 mg | INTRAMUSCULAR | Status: AC | PRN

## 2023-12-18 MED ORDER — IOHEXOL 350 MG/ML SOLN
INTRAVENOUS | Status: DC | PRN
  Administered 2023-12-18: 60 mL

## 2023-12-18 MED ORDER — HEPARIN SODIUM (PORCINE) 1000 UNIT/ML IJ SOLN
INTRAMUSCULAR | Status: DC | PRN
  Administered 2023-12-18: 5000 [IU] via INTRAVENOUS

## 2023-12-18 MED ORDER — ASPIRIN 81 MG PO CHEW
81.0000 mg | CHEWABLE_TABLET | ORAL | Status: AC
  Administered 2023-12-18: 81 mg via ORAL
  Filled 2023-12-18: qty 1

## 2023-12-18 MED ORDER — LABETALOL HCL 5 MG/ML IV SOLN: 10.0000 mg | INTRAVENOUS | Status: AC | PRN

## 2023-12-18 MED ORDER — HEPARIN SODIUM (PORCINE) 1000 UNIT/ML IJ SOLN
INTRAMUSCULAR | Status: AC
  Filled 2023-12-18: qty 10

## 2023-12-18 MED ORDER — AMIODARONE HCL 200 MG PO TABS
400.0000 mg | ORAL_TABLET | Freq: Two times a day (BID) | ORAL | Status: DC
  Administered 2023-12-18 – 2023-12-28 (×21): 400 mg via ORAL
  Filled 2023-12-18 (×21): qty 2

## 2023-12-18 MED ORDER — ACETAMINOPHEN 325 MG PO TABS
650.0000 mg | ORAL_TABLET | ORAL | Status: DC | PRN
  Administered 2023-12-19 – 2023-12-24 (×3): 650 mg via ORAL
  Filled 2023-12-18 (×3): qty 2

## 2023-12-18 MED ORDER — LIDOCAINE HCL (PF) 1 % IJ SOLN
INTRAMUSCULAR | Status: DC | PRN
  Administered 2023-12-18 (×2): 2 mL via INTRADERMAL

## 2023-12-18 MED ORDER — VERAPAMIL HCL 2.5 MG/ML IV SOLN
INTRAVENOUS | Status: DC | PRN
  Administered 2023-12-18: 10 mL via INTRA_ARTERIAL

## 2023-12-18 MED ORDER — METOPROLOL SUCCINATE ER 25 MG PO TB24
25.0000 mg | ORAL_TABLET | Freq: Every day | ORAL | Status: DC
  Administered 2023-12-19 – 2023-12-28 (×10): 25 mg via ORAL
  Filled 2023-12-18 (×10): qty 1

## 2023-12-18 MED ORDER — APIXABAN 5 MG PO TABS
5.0000 mg | ORAL_TABLET | Freq: Two times a day (BID) | ORAL | Status: DC
  Administered 2023-12-18 – 2023-12-28 (×20): 5 mg via ORAL
  Filled 2023-12-18 (×11): qty 1
  Filled 2023-12-18: qty 2
  Filled 2023-12-18 (×9): qty 1

## 2023-12-18 MED ORDER — FENTANYL CITRATE (PF) 100 MCG/2ML IJ SOLN
INTRAMUSCULAR | Status: DC | PRN
  Administered 2023-12-18 (×2): 25 ug via INTRAVENOUS

## 2023-12-18 SURGICAL SUPPLY — 15 items
CATH BALLN WEDGE 5F 110CM (CATHETERS) IMPLANT
CATH INFINITI 5FR MULTPACK ANG (CATHETERS) IMPLANT
CATH LAUNCHER 5F AL1 (CATHETERS) IMPLANT
CATH SWAN GANZ 7F STRAIGHT (CATHETERS) IMPLANT
DEVICE RAD COMP TR BAND LRG (VASCULAR PRODUCTS) IMPLANT
GLIDESHEATH SLEND SS 6F .021 (SHEATH) IMPLANT
GLIDESHEATH SLENDER 7FR .021G (SHEATH) IMPLANT
GUIDEWIRE .025 260CM (WIRE) IMPLANT
GUIDEWIRE INQWIRE 1.5J.035X260 (WIRE) IMPLANT
KIT HEART LEFT (KITS) IMPLANT
KIT MICROPUNCTURE NIT STIFF (SHEATH) IMPLANT
PACK CARDIAC CATHETERIZATION (CUSTOM PROCEDURE TRAY) ×1 IMPLANT
SHEATH PROBE COVER 6X72 (BAG) IMPLANT
TRANSDUCER W/STOPCOCK (MISCELLANEOUS) IMPLANT
TUBING CIL FLEX 10 FLL-RA (TUBING) IMPLANT

## 2023-12-18 NOTE — Consult Note (Signed)
 Palliative Care Consult Note                                  Date: 12/18/2023   Patient Name: Douglas [name]  DOB: [birth year]  MRN: [id number]  Age / Sex: 68 y.o., male  PCP: Grooms, Charmaine, PA-C Referring Physician: Arlice Reichert, MD  Reason for Consultation: Establishing goals of care  HPI/Patient Profile: 68 y.o. male  with past medical history of permanent a-fib, chronic HFrEF, ischemic cardiomyopathy, status post Medtronic ICD, type 2 diabetes, HTN, and chronic back pain with opioid dependence who presented to the ED on 12/12/2023 with multiple complaints, including nausea and abdominal pain.  He was admitted with acute hypoxic respiratory failure, new ventricular tachycardia, and acute on chronic CHF.  Palliative Medicine has been consulted for goals of care discussions and complex medical decision making.   Clinical Assessment and Goals of Care:   Extensive chart review has been completed including labs, vital signs, imaging, progress/consult notes, orders, medications and available advance directive documents.    Patient underwent LHC earlier today.  I met with him at bedside to discuss diagnosis, prognosis, GOC, EOL wishes, disposition, and options. He is pleasant, alert/oriented, and denies acute complaints.   I introduced Palliative Medicine as specialized medical care for people living with serious illness. It focuses on providing relief from the symptoms and stress of a serious illness.   Created space and opportunity for patient to express thoughts and feelings regarding current medical situation. Values and goals of care were attempted to be elicited.   Life Review: Patient is divorced. He previously worked as a Freight forwarder. He has siblings, but they are essentially estranged.   Functional Status: Patient lives at home alone.  At baseline, he is ambulatory and independent with basic ADLs.  He shares  that his neighbors [name] and [name] check on him daily and help him with medications and groceries.  Discussion: We discussed patient's current illness and what it means in the larger context of his ongoing co-morbidities. Current clinical status was reviewed.  I offered education on the natural trajectory of advanced heart failure, emphasizing this is a progressive and noncurable disease.  Throughout our conversation, patient is very pleasant, but is often tangential and does require frequent redirection.  He ultimately indicates that he wants to live, but also speaks to the importance of maintaining his independence and not wanting to suffer.  Encouraged patient to consider what medical interventions he would (or would not want) in the event his condition were to deteriorate, keeping in mind the concept of quality of life.    Discussed code status. I encouraged consideration of DNR/DNI status and provided education on evidence-based poor outcomes in similar hospitalized patients, as the cause of cardiac arrest would likely be associated with advanced illness rather than a reversible condition. Patient seems to indicate he would agree with DNR status, but is not ready to make that decision today.  Patient does state that he would want [name] and [name] to make medical decisions on his behalf in the event he was not able to make decisions for himself.  He is open to completing advanced directive documents while in the hospital.  Discussed the importance of continued conversation with the medical team regarding overall plan of care and treatment options. Questions and concerns addressed.  Emotional support provided. ________________________________________________________________________________  With patient's permission, I later spoke with his  neighbor Research officer, trade [employer] by phone.  I introduced myself and the role of palliative medicine, and relayed to her my conversation with patient as per above.  She states  that she and her husband [name] are willing to be patient's healthcare agents.  I discussed with [name] the importance of continued conversation with patient regarding advance care planning, especially related to code status.    Review of Systems  Respiratory:  Negative for shortness of breath.   Cardiovascular:  Negative for chest pain.    Objective:   Primary Diagnoses: Present on Admission:  Acute hypoxemic respiratory failure (HCC)  Atrial fibrillation, chronic (HCC)  Cardiomyopathy, ischemic  Chronic low back pain  Chronic systolic CHF (congestive heart failure) (HCC)  Essential hypertension  Mild recurrent major depression  Ventricular tachycardia (HCC)  ICD (implantable cardioverter-defibrillator) in place   Physical Exam Vitals reviewed.  Constitutional:      General: He is not in acute distress.    Appearance: He is obese.     Comments: Chronically ill-appearing  Cardiovascular:     Rate and Rhythm: Normal rate.  Pulmonary:     Effort: No respiratory distress.  Neurological:     Mental Status: He is alert and oriented to person, place, and time.  Psychiatric:        Speech: Speech is tangential.     Palliative Assessment/Data: PPS 50%     Assessment & Plan:   SUMMARY OF RECOMMENDATIONS   Continue full scope care Goal care is medical stabilization Spiritual care referral to assist with advance directives PMT will continue to follow  Primary Decision Maker: PATIENT  Existing Vynca/ACP Documentation: None  Code Status/Advance Care Planning: Full code  Symptom Management:  Per attending  Prognosis:  Unable to determine  Discharge Planning:  To Be Determined    Thank you for allowing us  to participate in the care of Douglas [name]   Time Total: 76 minutes  Detailed review of medical records (labs, imaging, vital signs), medically appropriate exam, discussed with treatment team, counseling and education to patient, family, & staff,  documenting clinical information, coordination of care.   Signed by: Recardo Loll, NP Palliative Medicine Team  Team Phone # [phone]  For individual providers, please see AMION

## 2023-12-18 NOTE — Progress Notes (Signed)
 PROGRESS NOTE  Douglas [name]  DOB: September 12, 1955  PCP: Mancil Pfeiffer, [place] FMW:[id number]  DOA: 12/12/2023  LOS: 6 days  Hospital Day: 7  Subjective: Patient was seen and examined this morning. Lying on bed.  He was just back from cardiac cath and had issues with bleeding from the left radial access site.  Cardiologist was at bedside. Afebrile, heart rate in 50s and 60s, blood pressure in 90s and low 100s, on 2 L of O2 Labs from this morning with sodium 139, creatinine 1.63 Underwent cardiac cath this morning   Brief narrative: Douglas [name] is a 68 y.o. male with PMH significant for obesity, DM2, HTN, HLD, A-fib on Eliquis , CAD, ischemic cardiopathy, CHF s/p AICD, anemia, asthma, opiate dependence 10/16, patient presented to ED at [facility] with complaint of 2 days of nausea, vomiting, abdominal pain, inability to urinate.  In the ED, patient is afebrile, heart rate more than 110, blood pressure in 150s, O2 sat 86% on room air, required 4 L oxygen for O2 sat over 90% Labs with proBNP elevated to over 5700, renal function normal, WBC count normal. RVP negative Chest x-ray unremarkable. Initial EKG with A-fib at 96 bpm CT abdomen and pelvis did not show any acute intra-abdominal or pelvic pathology, hydronephrosis or nephrolithiasis.  Ureters and urinary bladder unremarkable.  Sigmoid diverticulosis Admitted to [facility]  While in the ED, telemetry showed episodes of sustained V. tach but converted to A-fib spontaneously. Patient remained hemodynamically stable. Cardiology was consulted and recommended initiation of amiodarone drip, interrogation of pacemaker, diuresis and transferred to [facility]. Heart failure service is following.  Assessment and plan: Acute exacerbation of chronic systolic CHF Hypertension Presented with abnormal symptoms and respiratory failure  proBNP was elevated to over 5700.  Per cardiology note, Optivol suggests ongoing fluid accumulation  since July Most recent echo from April 2024 with EF 35 to 40%.  Pending repeat echo PTA meds- Toprol  25 mg daily, Jardiance  10 mg daily, Aldactone , torsemide  20 mg daily Heart failure services following Net negative balance more than 7.5 lit Defer to cardiology for GDMT. Currently on Toprol  25 mg daily and torsemide  20 mg daily Continue to monitor for daily intake output, weight, blood pressure, BNP, renal function and electrolytes. Net IO Since Admission: -7,581.89 mL [12/18/23 1332] Recent Labs  Lab 12/12/23 1234 12/12/23 1831 12/13/23 1503 12/13/23 1756 12/14/23 0408 12/15/23 0510 12/16/23 0215 12/17/23 0256 12/18/23 0251  PROBNP 5,740.0*  --   --   --   --   --   --   --   --   LATICACIDVEN  --   --  1.4 1.5  --   --   --   --   --   BUN 15   < > 27*  --  32* 29* 32* 36* 46*  CREATININE 1.09   < > 2.02*  --  1.75* 1.34* 1.40* 1.56* 1.63*  NA 138   < > 137  --  135 134* 133* 131* 131*  K 3.4*   < > 5.1  --  4.8 4.2 4.8 4.8 5.1  MG  --    < > 2.0  --  1.8 2.0 1.7 2.6* 2.4   < > = values in this interval not displayed.   A-fib with RVR  Permanent A-fib Recurrent ventricular tachycardia Initial EKG with A-fib with mild tachycardia. While in the ED, patient had 2 minutes of ventricular tachycardia and converted to A-fib again. 10/19, had an episode  of sustained VT leading to syncope.  Seen by EP.  10/22, underwent RHC/LHC found to have severe native three-vessel CAD with severe ischemic cardiomyopathy, continued patency of all 4 bypass grafts, severe IV failure with severely decreased cardiac output with elevated filling pressures -suggestive of end-stage CHF Heart failure service plans to continue diuresis, and reluctant to add inotropes in the setting of recent VT.  Palliative care consulted Remains on amiodarone, mexiletine, Toprol . Chronically anticoagulated with Eliquis .  Held for [facility] today.  To be resumed by cardiology   Elevated troponin H/o CAD s/p  PCI/CABG HLD Troponin was  elevated to 53. Likely demand ischemia in the setting of RVR and CHF exacerbation. Continue Toprol , Eliquis , Zetia , crestor   Acute respiratory failure with hypoxia Not on supplemental oxygen at home Currently requiring 3 L oxygen.  Secondary to CHF.   Check ambulatory oxygen requirement.  AKI Baseline creatinine normal.  Creatinine peaked at 2.02 with IV Lasix .  Gradually improving.  Continue to monitor Recent Labs    05/31/23 0410 11/21/23 0933 12/12/23 1234 12/13/23 9356 12/13/23 1503 12/14/23 0408 12/15/23 0510 12/16/23 0215 12/17/23 0256 12/18/23 0251  BUN 23 17 15 21  27* 32* 29* 32* 36* 46*  CREATININE 1.17 1.91* 1.09 1.86* 2.02* 1.75* 1.34* 1.40* 1.56* 1.63*  CO2 25 23 27  20* 24 25 30 30 31 29    HypOkalemia/hypERkalemia Potassium level improved Recent Labs  Lab 12/14/23 0408 12/15/23 0510 12/16/23 0215 12/17/23 0256 12/18/23 0251  K 4.8 4.2 4.8 4.8 5.1  MG 1.8 2.0 1.7 2.6* 2.4   Abdominal pain Primarily presented with lower abdominal pain, difficulty with voiding.   CT abdomen and pelvis without contrast negative for acute abnormality.   Urinary bladder, ureters kidneys without signs of obstruction.    Type 2 diabetes mellitus A1c 5.9 on April 2025 PTA meds-Jardiance  10 mg daily Continue SSI/Accu-Cheks Recent Labs  Lab 12/12/23 2146 12/13/23 1140  GLUCAP 142* 128*   Chronic low back pain Chronic opiate dependence Report multiple falls  PT eval obtained.  Home health PT recommended. PTA meds- Percocet 10/325 every 6 hours as needed continue the same  Constipation Start on scheduled and as needed bowel regimen  Anxiety/depression PTA meds-Celexa  20 mg daily, Xanax  1 mg twice daily as needed Currently on same  Obesity 2 Body mass index is 37.77 kg/m. Patient has been advised to make an attempt to improve diet and exercise patterns to aid in weight loss.    Mobility: Mobility is limited due to back pain.  Seen by  PT.  Home with PT recommended PT Orders: Active   PT Follow up Rec: Home Health Pt10/20/2025 1600   Goals of care   Code Status: Full Code     DVT prophylaxis:   apixaban  (ELIQUIS ) tablet 5 mg   Antimicrobials: None Fluid: None Consultants: Cardiology Family Communication: None at bedside  Status: Inpatient Level of care:  Progressive   Patient is from: Home Needs to continue in-hospital care: Ongoing workup Anticipated d/c to: Hopefully home eventually in 1 to 2 days.    Diet:  Diet Order             Diet Heart Room service appropriate? Yes; Fluid consistency: Thin  Diet effective now                   Scheduled Meds:  amiodarone  400 mg Oral BID   apixaban   5 mg Oral BID   Chlorhexidine  Gluconate Cloth  6 each Topical Daily  citalopram   20 mg Oral Daily   empagliflozin   10 mg Oral Daily   ezetimibe   10 mg Oral Daily   fluticasone   1 spray Each Nare Daily   fluticasone  furoate-vilanterol  1 puff Inhalation Daily   [START ON 12/19/2023] metoprolol  succinate  25 mg Oral Daily   mexiletine  150 mg Oral Q12H   pantoprazole   20 mg Oral Daily   rosuvastatin   40 mg Oral Daily   senna-docusate  1 tablet Oral QHS   tamsulosin   0.4 mg Oral Daily   torsemide   20 mg Oral Daily   traZODone   50 mg Oral QHS    PRN meds: sodium chloride , acetaminophen , albuterol , ALPRAZolam , artificial tears, hydrALAZINE, labetalol, ondansetron  **OR** ondansetron  (ZOFRAN ) IV, mouth rinse, oxyCODONE -acetaminophen  **AND** oxyCODONE , polyethylene glycol, sodium chloride , tiZANidine    Infusions:   sodium chloride        Antimicrobials: Anti-infectives (From admission, onward)    None       Objective: Vitals:   12/18/23 1030 12/18/23 1155  BP: (!) 156/86 (!) 148/78  Pulse:  (!) 57  Resp: 17 17  Temp: 97.7 F (36.5 C) 97.7 F (36.5 C)  SpO2:      Intake/Output Summary (Last 24 hours) at 12/18/2023 1332 Last data filed at 12/18/2023 0400 Gross per 24 hour  Intake  513.02 ml  Output 300 ml  Net 213.02 ml   Filed Weights   12/16/23 0729 12/17/23 0334 12/18/23 0350  Weight: 113.6 kg 116.3 kg 116 kg   Weight change: 2.4 kg Body mass index is 37.77 kg/m.   Physical Exam: General exam: Pleasant, elderly.  No acute distress Skin: No rashes, lesions or ulcers. HEENT: Atraumatic, normocephalic, no obvious bleeding Lungs: Clear to auscultation bilaterally CVS: S1-S2, no murmur GI/Abd: Soft, nontender, distended from obesity, bowel sound present,   CNS: Alert, awake, oriented to place.  Has impaired vision on the right eye at baseline Psychiatry: Mood appropriate Extremities: No pedal edema, no calf tenderness, left radial artery catheter access site with some bleeding at the time of my evaluation  Data Review: I have personally reviewed the laboratory data and studies available.  F/u labs ordered Unresulted Labs (From admission, onward)     Start     Ordered   12/15/23 0500  Magnesium   Daily,   R     Question:  Specimen collection method  Answer:  Unit=Unit collect   12/14/23 0931   12/15/23 0500  Cooxemetry Panel (carboxy, met, total hgb, O2 sat)  Daily,   R     Question:  Specimen collection method  Answer:  Unit=Unit collect   12/14/23 0931   12/15/23 0500  CBC  Daily,   R     Question:  Specimen collection method  Answer:  Unit=Unit collect   12/14/23 0931   12/14/23 0500  Basic metabolic panel with GFR  Daily,   R     Question:  Specimen collection method  Answer:  Lab=Lab collect   12/13/23 1336            Signed, Chapman Rota, MD [facility] 12/18/2023

## 2023-12-18 NOTE — Progress Notes (Addendum)
 PT Cancellation Note  Patient Details Name: Douglas [name] MRN: [id number] DOB: [birth year]   Cancelled Treatment:    Reason Eval/Treat Not Completed: (P) Patient at procedure or test/unavailable (Pt off unit for R/L heart cath ~10am. Still with TR band at time of reattempt ~1335 pm.) Will continue efforts per PT plan of care as schedule permits.   [name] [name] 12/18/2023, 1:52 PM

## 2023-12-18 NOTE — Progress Notes (Signed)
 Patient ID: Douglas [name], male   DOB: 1955-12-18, 68 y.o.   MRN: [id number]     Advanced Heart Failure Rounding Note  Cardiologist: Danelle Birmingham, MD   Chief Complaint: CHF  Subjective:    10/19 patient was ambulating to the bathroom with his walker. RN witnessed him fall face first. He was briefly unconscious. He does not recall the event. CT head okay. On tele review: episodes of monomorphic VT noted with what appears to be ATP/? Shock.   No further VT on IV amio and mexilitene. Diuretics held yesterday for cath today. Scr 1.56 -> 1.63  Multiple complaints. Feels weak. SOB with exertion. Co-ox 50%  Eliquis  on hold. For R/L cath this am.   Objective:   Weight Range: 116 kg Body mass index is 37.77 kg/m.   Vital Signs:   Temp:  [97.4 F (36.3 C)-97.7 F (36.5 C)] 97.5 F (36.4 C) (10/22 0813) Pulse Rate:  [52-71] 70 (10/22 0813) Resp:  [18-20] 20 (10/22 0813) BP: (96-116)/(70-89) 116/84 (10/22 0813) SpO2:  [93 %-97 %] 93 % (10/22 0813) Weight:  [883 kg] 116 kg (10/22 0350) Last BM Date : 12/17/23  Weight change: Filed Weights   12/16/23 0729 12/17/23 0334 12/18/23 0350  Weight: 113.6 kg 116.3 kg 116 kg    Intake/Output:   Intake/Output Summary (Last 24 hours) at 12/18/2023 0839 Last data filed at 12/18/2023 0400 Gross per 24 hour  Intake 513.02 ml  Output 1000 ml  Net -486.98 ml      Physical Exam    General:  Elderly male. No resp difficulty HEENT: normal Neck: supple. JVP 10. Carotids 2+ bilat; no bruits. No lymphadenopathy or thryomegaly appreciated. Cor: Irregular Lungs: clear Abdomen: obese soft, nontender, nondistended. No hepatosplenomegaly. No bruits or masses. Good bowel sounds. Extremities: no cyanosis, clubbing, rash, tr-1+ edema Neuro: alert & orientedx3, cranial nerves grossly intact. moves all 4 extremities w/o difficulty. Affect pleasant   Telemetry   AF 60-70 Personally reviewed  Labs    CBC Recent Labs    12/17/23 0256  12/18/23 0251  WBC 9.1 6.9  HGB 12.7* 12.7*  HCT 43.1 42.4  MCV 83.0 82.3  PLT 282 243   Basic Metabolic Panel Recent Labs    89/78/74 0256 12/18/23 0251  NA 131* 131*  K 4.8 5.1  CL 92* 91*  CO2 31 29  GLUCOSE 109* 117*  BUN 36* 46*  CREATININE 1.56* 1.63*  CALCIUM  9.0 8.9  MG 2.6* 2.4   Liver Function Tests No results for input(s): AST, ALT, ALKPHOS, BILITOT, PROT, ALBUMIN in the last 72 hours.  No results for input(s): LIPASE, AMYLASE in the last 72 hours.  Cardiac Enzymes No results for input(s): CKTOTAL, CKMB, CKMBINDEX, TROPONINI in the last 72 hours.  BNP: BNP (last 3 results) Recent Labs    04/23/23 1840 05/28/23 0322 05/29/23 0413  BNP 253.0* 557.0* 672.0*    ProBNP (last 3 results) Recent Labs    12/12/23 1234  PROBNP 5,740.0*     D-Dimer No results for input(s): DDIMER in the last 72 hours. Hemoglobin A1C No results for input(s): HGBA1C in the last 72 hours. Fasting Lipid Panel No results for input(s): CHOL, HDL, LDLCALC, TRIG, CHOLHDL, LDLDIRECT in the last 72 hours. Thyroid Function Tests No results for input(s): TSH, T4TOTAL, T3FREE, THYROIDAB in the last 72 hours.  Invalid input(s): FREET3  Other results:  Imaging  No results found.   Medications:   Scheduled Medications:  [MAR Hold] Chlorhexidine  Gluconate Cloth  6 each Topical Daily   [MAR Hold] citalopram   20 mg Oral Daily   [MAR Hold] empagliflozin   10 mg Oral Daily   [MAR Hold] ezetimibe   10 mg Oral Daily   [MAR Hold] fluticasone   1 spray Each Nare Daily   [MAR Hold] fluticasone  furoate-vilanterol  1 puff Inhalation Daily   [MAR Hold] metoprolol  succinate  50 mg Oral Daily   [MAR Hold] mexiletine  150 mg Oral Q12H   [MAR Hold] pantoprazole   20 mg Oral Daily   [MAR Hold] rosuvastatin   40 mg Oral Daily   [MAR Hold] senna-docusate  1 tablet Oral QHS   [MAR Hold] tamsulosin   0.4 mg Oral Daily   [MAR Hold] torsemide   20 mg  Oral Daily   [MAR Hold] traZODone   50 mg Oral QHS    Infusions:  amiodarone 30 mg/hr (12/18/23 0400)     PRN Medications: [MAR Hold] acetaminophen  **OR** [MAR Hold] acetaminophen , [MAR Hold] albuterol , [MAR Hold] ALPRAZolam , [MAR Hold] artificial tears, [MAR Hold] ondansetron  **OR** [MAR Hold] ondansetron  (ZOFRAN ) IV, [MAR Hold] mouth rinse, [MAR Hold] oxyCODONE -acetaminophen  **AND** [MAR Hold] oxyCODONE , [MAR Hold] polyethylene glycol, [MAR Hold] sodium chloride , [MAR Hold] tiZANidine  Assessment/Plan   1. Acute on chronic systolic CHF: Ischemic cardiomyopathy.  Medtronic ICD.  He has been on minimal GDMT at home, apparently limited by low BP.  Patient has a long-standing cardiomyopathy, echoes with EF in the 20-25% range since at least 2015.  By Dr. Rolan review of echo this admission, EF 25% with moderate RV dysfunction and dilated IVC.  - Remains tenuous CO-OX 50%.  - CVP ~10  Plan R/L HC today. Adjust diuretics as neede - Titrate GDMT as tolerated. Off spiro with high K.  - I am very worried about his trajectory and ability to return home alone - Will ask Palliative to see to help establish GOC   2. VT: Patient has permanent AF but was noted to have a run of VT this admission treated with ATP (dual tachycardia).  Seen by EP. He was stated on amiodarone, now po.  Trigger for VT may have been drastic drop recently in Toprol  XL dose 200 mg daily => 12.5 daily.   - Another run of VT 10/19 w/ syncopal event, looks like treated with ATP (initiated with PMVT) - Continue Toprol  XL 50 mg daily.  Will try to keep this as his maintenance dose.  Do not think he will tolerate going back to 200 mg daily.  - Now on IV amiodarone. EP following. Mexilitine added.  - EP has seen concerned for ischemic substrate. Coronary angio recommended. Plan for this am  Informed Consent   Shared Decision Making/Informed Consent The risks [stroke (1 in 1000), death (1 in 1000), kidney failure [usually temporary] (1  in 500), bleeding (1 in 200), allergic reaction [possibly serious] (1 in 200)], benefits (diagnostic support and management of coronary artery disease) and alternatives of a cardiac catheterization were discussed in detail with Douglas [name] and he is willing to proceed.      3. Atrial fibrillation: Permanent.  He is currently rate-controlled.  - Continue apixaban , holding for cath - Toprol  XL as above  4. AKI: Creatinine jumped 1.09 => 1.86 => 2.02 with elevated K.  He received Lokelma.  Foley placed, 500 cc urine out and abdominal pain resolved.  ?Obstruction by BPH. Creatinine 1.56 -> 1.63 today. AKI may have been obstructive.  - Now on Flomax  for suspected BPH.  - Follow closely with cath and diuresis  5. CAD: H/o CABG 2002.  Last cath in 2013 with occluded LAD and RCA, 2 occluded distal OMs.  No chest pain.  Troponin minimally elevated with no trend, suspect demand ischemia.  - Plan cath today  - Grafts are LIMA - LAD, SVG -Diag, SVG - OM2 -OM3.   Length of Stay: 6  Toribio Fuel, MD  12/18/2023, 8:39 AM  Advanced Heart Failure Team Pager [phone] (M-F; 7a - 5p)  Please contact [facility] for night-coverage after hours (5p -7a ) and weekends on [email or web address]

## 2023-12-18 NOTE — H&P (View-Only) (Signed)
 Patient ID: Douglas [name], male   DOB: 1955-12-18, 68 y.o.   MRN: [id number]     Advanced Heart Failure Rounding Note  Cardiologist: Danelle Birmingham, MD   Chief Complaint: CHF  Subjective:    10/19 patient was ambulating to the bathroom with his walker. RN witnessed him fall face first. He was briefly unconscious. He does not recall the event. CT head okay. On tele review: episodes of monomorphic VT noted with what appears to be ATP/? Shock.   No further VT on IV amio and mexilitene. Diuretics held yesterday for cath today. Scr 1.56 -> 1.63  Multiple complaints. Feels weak. SOB with exertion. Co-ox 50%  Eliquis  on hold. For R/L cath this am.   Objective:   Weight Range: 116 kg Body mass index is 37.77 kg/m.   Vital Signs:   Temp:  [97.4 F (36.3 C)-97.7 F (36.5 C)] 97.5 F (36.4 C) (10/22 0813) Pulse Rate:  [52-71] 70 (10/22 0813) Resp:  [18-20] 20 (10/22 0813) BP: (96-116)/(70-89) 116/84 (10/22 0813) SpO2:  [93 %-97 %] 93 % (10/22 0813) Weight:  [883 kg] 116 kg (10/22 0350) Last BM Date : 12/17/23  Weight change: Filed Weights   12/16/23 0729 12/17/23 0334 12/18/23 0350  Weight: 113.6 kg 116.3 kg 116 kg    Intake/Output:   Intake/Output Summary (Last 24 hours) at 12/18/2023 0839 Last data filed at 12/18/2023 0400 Gross per 24 hour  Intake 513.02 ml  Output 1000 ml  Net -486.98 ml      Physical Exam    General:  Elderly male. No resp difficulty HEENT: normal Neck: supple. JVP 10. Carotids 2+ bilat; no bruits. No lymphadenopathy or thryomegaly appreciated. Cor: Irregular Lungs: clear Abdomen: obese soft, nontender, nondistended. No hepatosplenomegaly. No bruits or masses. Good bowel sounds. Extremities: no cyanosis, clubbing, rash, tr-1+ edema Neuro: alert & orientedx3, cranial nerves grossly intact. moves all 4 extremities w/o difficulty. Affect pleasant   Telemetry   AF 60-70 Personally reviewed  Labs    CBC Recent Labs    12/17/23 0256  12/18/23 0251  WBC 9.1 6.9  HGB 12.7* 12.7*  HCT 43.1 42.4  MCV 83.0 82.3  PLT 282 243   Basic Metabolic Panel Recent Labs    89/78/74 0256 12/18/23 0251  NA 131* 131*  K 4.8 5.1  CL 92* 91*  CO2 31 29  GLUCOSE 109* 117*  BUN 36* 46*  CREATININE 1.56* 1.63*  CALCIUM  9.0 8.9  MG 2.6* 2.4   Liver Function Tests No results for input(s): AST, ALT, ALKPHOS, BILITOT, PROT, ALBUMIN in the last 72 hours.  No results for input(s): LIPASE, AMYLASE in the last 72 hours.  Cardiac Enzymes No results for input(s): CKTOTAL, CKMB, CKMBINDEX, TROPONINI in the last 72 hours.  BNP: BNP (last 3 results) Recent Labs    04/23/23 1840 05/28/23 0322 05/29/23 0413  BNP 253.0* 557.0* 672.0*    ProBNP (last 3 results) Recent Labs    12/12/23 1234  PROBNP 5,740.0*     D-Dimer No results for input(s): DDIMER in the last 72 hours. Hemoglobin A1C No results for input(s): HGBA1C in the last 72 hours. Fasting Lipid Panel No results for input(s): CHOL, HDL, LDLCALC, TRIG, CHOLHDL, LDLDIRECT in the last 72 hours. Thyroid Function Tests No results for input(s): TSH, T4TOTAL, T3FREE, THYROIDAB in the last 72 hours.  Invalid input(s): FREET3  Other results:  Imaging  No results found.   Medications:   Scheduled Medications:  [MAR Hold] Chlorhexidine  Gluconate Cloth  6 each Topical Daily   [MAR Hold] citalopram   20 mg Oral Daily   [MAR Hold] empagliflozin   10 mg Oral Daily   [MAR Hold] ezetimibe   10 mg Oral Daily   [MAR Hold] fluticasone   1 spray Each Nare Daily   [MAR Hold] fluticasone  furoate-vilanterol  1 puff Inhalation Daily   [MAR Hold] metoprolol  succinate  50 mg Oral Daily   [MAR Hold] mexiletine  150 mg Oral Q12H   [MAR Hold] pantoprazole   20 mg Oral Daily   [MAR Hold] rosuvastatin   40 mg Oral Daily   [MAR Hold] senna-docusate  1 tablet Oral QHS   [MAR Hold] tamsulosin   0.4 mg Oral Daily   [MAR Hold] torsemide   20 mg  Oral Daily   [MAR Hold] traZODone   50 mg Oral QHS    Infusions:  amiodarone 30 mg/hr (12/18/23 0400)     PRN Medications: [MAR Hold] acetaminophen  **OR** [MAR Hold] acetaminophen , [MAR Hold] albuterol , [MAR Hold] ALPRAZolam , [MAR Hold] artificial tears, [MAR Hold] ondansetron  **OR** [MAR Hold] ondansetron  (ZOFRAN ) IV, [MAR Hold] mouth rinse, [MAR Hold] oxyCODONE -acetaminophen  **AND** [MAR Hold] oxyCODONE , [MAR Hold] polyethylene glycol, [MAR Hold] sodium chloride , [MAR Hold] tiZANidine  Assessment/Plan   1. Acute on chronic systolic CHF: Ischemic cardiomyopathy.  Medtronic ICD.  He has been on minimal GDMT at home, apparently limited by low BP.  Patient has a long-standing cardiomyopathy, echoes with EF in the 20-25% range since at least 2015.  By Dr. Rolan review of echo this admission, EF 25% with moderate RV dysfunction and dilated IVC.  - Remains tenuous CO-OX 50%.  - CVP ~10  Plan R/L HC today. Adjust diuretics as neede - Titrate GDMT as tolerated. Off spiro with high K.  - I am very worried about his trajectory and ability to return home alone - Will ask Palliative to see to help establish GOC   2. VT: Patient has permanent AF but was noted to have a run of VT this admission treated with ATP (dual tachycardia).  Seen by EP. He was stated on amiodarone, now po.  Trigger for VT may have been drastic drop recently in Toprol  XL dose 200 mg daily => 12.5 daily.   - Another run of VT 10/19 w/ syncopal event, looks like treated with ATP (initiated with PMVT) - Continue Toprol  XL 50 mg daily.  Will try to keep this as his maintenance dose.  Do not think he will tolerate going back to 200 mg daily.  - Now on IV amiodarone. EP following. Mexilitine added.  - EP has seen concerned for ischemic substrate. Coronary angio recommended. Plan for this am  Informed Consent   Shared Decision Making/Informed Consent The risks [stroke (1 in 1000), death (1 in 1000), kidney failure [usually temporary] (1  in 500), bleeding (1 in 200), allergic reaction [possibly serious] (1 in 200)], benefits (diagnostic support and management of coronary artery disease) and alternatives of a cardiac catheterization were discussed in detail with Mr. Vonzell and he is willing to proceed.      3. Atrial fibrillation: Permanent.  He is currently rate-controlled.  - Continue apixaban , holding for cath - Toprol  XL as above  4. AKI: Creatinine jumped 1.09 => 1.86 => 2.02 with elevated K.  He received Lokelma.  Foley placed, 500 cc urine out and abdominal pain resolved.  ?Obstruction by BPH. Creatinine 1.56 -> 1.63 today. AKI may have been obstructive.  - Now on Flomax  for suspected BPH.  - Follow closely with cath and diuresis  5. CAD: H/o CABG 2002.  Last cath in 2013 with occluded LAD and RCA, 2 occluded distal OMs.  No chest pain.  Troponin minimally elevated with no trend, suspect demand ischemia.  - Plan cath today  - Grafts are LIMA - LAD, SVG -Diag, SVG - OM2 -OM3.   Length of Stay: 6  Toribio Fuel, MD  12/18/2023, 8:39 AM  Advanced Heart Failure Team Pager [phone] (M-F; 7a - 5p)  Please contact [facility] for night-coverage after hours (5p -7a ) and weekends on [email or web address]

## 2023-12-18 NOTE — Interval H&P Note (Signed)
 History and Physical Interval Note:  12/18/2023 8:48 AM  Douglas [name]  has presented today for surgery, with the diagnosis of vt.  The various methods of treatment have been discussed with the patient and family. After consideration of risks, benefits and other options for treatment, the patient has consented to  Procedure(s): RIGHT/LEFT HEART CATH AND CORONARY/GRAFT ANGIOGRAPHY (N/A) as a surgical intervention.  The patient's history has been reviewed, patient examined, no change in status, stable for surgery.  I have reviewed the patient's chart and labs.  Questions were answered to the patient's satisfaction.     [name]

## 2023-12-18 NOTE — Progress Notes (Addendum)
 Patient Name: Douglas [name] Date of Encounter: 12/18/2023  Primary Cardiologist: Danelle Birmingham, MD Electrophysiologist: None  Interval Summary   The patient expresses a lot of general frustration this morning regarding this admission.  Continue with weakness, dyspnea with exertion.   Vital Signs    Vitals:   12/17/23 1700 12/17/23 2030 12/17/23 2320 12/18/23 0350  BP: 109/79 109/89 97/76 96/70   Pulse: 71 64 (!) 52 60  Resp: 18 20 18 18   Temp: 97.7 F (36.5 C) (!) 97.4 F (36.3 C) (!) 97.4 F (36.3 C) 97.6 F (36.4 C)  TempSrc: Oral Oral Axillary Oral  SpO2:  93% 93% 97%  Weight:    116 kg  Height:        Intake/Output Summary (Last 24 hours) at 12/18/2023 0646 Last data filed at 12/18/2023 0400 Gross per 24 hour  Intake 513.02 ml  Output 1000 ml  Net -486.98 ml   Filed Weights   12/16/23 0729 12/17/23 0334 12/18/23 0350  Weight: 113.6 kg 116.3 kg 116 kg    Physical Exam    GEN- NAD, Alert and oriented. Chronically ill appearing Lungs- normal work of breathing Cardiac- Irregularly irregular rate and rhythm, no murmurs, rubs or gallops GI- soft, NT, ND, + BS Extremities- no clubbing or cyanosis. No edema  Telemetry    Permanent atrial fibrillation with ventricular rates 60s-80s, rare V pacing (personally reviewed)  Hospital Course    Douglas [name] is a 68 y.o. male with history of CAD (s/p remote PCI and CABG), ICM, chronic CHF, VT s/p ICD, permanent afib, hypertension, hyperlipidemia, asthma admitted for acute CHF and VT.   Assessment & Plan    Ventricular tachycardia Patient presenting with GI symptoms admitted for management of decompensated CHF. Patient developed VT successfully treated with ATP. He was subsequently started on Amiodarone and transferred to [facility]. Upon transfer, device report showed at least 1 episode of VT successfully treated with VT. Initially VT suspected secondary to CHF. IV Amiodarone transitioned to PO on 12/14/23 and patient  resumed on low dose Toprol . VT initially attributed to CHF and medication adjustments. However, on 10/19 patient had syncope with recurrent fast VT ( , in VF zone) that was successfully terminated with ATP while charging. IV Amiodarone resumed. Mexiletine 150mg  BID started. No recurrent VT LHC/RHC tentatively today with HF team Would continue IV Amiodarone through cath, then transition to PO 400mg  BID x2 weeks, then 200mg  daily. Continue Mexiletine 150mg  BID Titrate Toprol  XL up as able  CAD Remote hx PCI and CABG, 4/4 grafts patent by LHC at [facility] 2019 (40-46% in-graft stenosis SVT to OM-2. LHC today  Permanent atrial fibrillation Ventricular rates remain well controlled, Eliquis  held pending LHC.    For questions or updates, please contact [facility] Please consult [email or web address] for contact info under     Signed, Artist Pouch, PA-C  12/18/2023, 6:46 AM   I have seen, examined the patient, and reviewed the above assessment and plan.    Interval:  No acute overnight events. He is upset about not having enough help overnight to ambulate to the bathroom. No new or acute medical complaints.   No ventricular arrhythmias.   General: Well developed, in no acute distress.  Neck: No JVD.  Cardiac: Normal rate, regular rhythm.  Resp: Normal work of breathing.  Ext: No edema.  Neuro: No gross focal deficits.  Psych: Normal affect.   Assessment:  Douglas [name] is a 67 year old male with past medical history notable for chronic systolic heart  failure in the setting of ischemic cardiomyopathy status post ICD.  He was admitted with acute decompensated heart failure.  Hospital course has been complicated by episodes of nonsustained VT which necessitated ATP therapies.  1 such occasion resulted in a episode of syncope.  The syncopal episode appears to have started with polymorphic ventricular tachycardia before organizing into a more fast monomorphic VT.  This episode occurred while  he was having a bowel movement.   Problem List:  Ventricular tachycardia Permanent atrial fibrillation Acute on chronic systolic heart failure Ischemic cardiomyopathy Status post ICD   Plan:  - LHC today to assess for ischemic etiology.  - Continue amiodarone 400mg  BID for 14 days, then transition to 200mg  once daily.  - Continue mexiletine 150 mg twice daily. - Continue metoprolol  XL 50 mg daily and uptitrate as able from an HF perspective.  He had previously been on 200 mg once daily. - Continue optimization of his heart failure status.  Appreciate assistance from HF team. - Resume oral anti-coagulation when able after LHC.  Fonda Kitty, MD, [facility] Cardiac Electrophysiology

## 2023-12-19 ENCOUNTER — Encounter (HOSPITAL_COMMUNITY): Payer: Self-pay | Admitting: Internal Medicine

## 2023-12-19 DIAGNOSIS — I472 Ventricular tachycardia, unspecified: Secondary | ICD-10-CM | POA: Diagnosis not present

## 2023-12-19 LAB — BASIC METABOLIC PANEL WITH GFR
Anion gap: 10 (ref 5–15)
BUN: 43 mg/dL — ABNORMAL HIGH (ref 8–23)
CO2: 29 mmol/L (ref 22–32)
Calcium: 8.8 mg/dL — ABNORMAL LOW (ref 8.9–10.3)
Chloride: 94 mmol/L — ABNORMAL LOW (ref 98–111)
Creatinine, Ser: 1.4 mg/dL — ABNORMAL HIGH (ref 0.61–1.24)
GFR, Estimated: 55 mL/min — ABNORMAL LOW (ref >60)
Glucose, Bld: 106 mg/dL — ABNORMAL HIGH (ref 70–99)
Potassium: 4.4 mmol/L (ref 3.5–5.1)
Sodium: 133 mmol/L — ABNORMAL LOW (ref 135–145)

## 2023-12-19 LAB — CBC
HCT: 40.3 % (ref 39.0–52.0)
Hemoglobin: 12.1 g/dL — ABNORMAL LOW (ref 13.0–17.0)
MCH: 24.5 pg — ABNORMAL LOW (ref 26.0–34.0)
MCHC: 30 g/dL (ref 30.0–36.0)
MCV: 81.7 fL (ref 80.0–100.0)
Platelets: 233 K/uL (ref 150–400)
RBC: 4.93 MIL/uL (ref 4.22–5.81)
RDW: 21.2 % — ABNORMAL HIGH (ref 11.5–15.5)
WBC: 7.5 K/uL (ref 4.0–10.5)
nRBC: 0 % (ref 0.0–0.2)

## 2023-12-19 LAB — COOXEMETRY PANEL
Carboxyhemoglobin: 1.7 % — ABNORMAL HIGH (ref 0.5–1.5)
Methemoglobin: <0.7 % (ref 0.0–1.5)
O2 Saturation: 54.9 %
Total hemoglobin: 13.8 g/dL (ref 12.0–16.0)

## 2023-12-19 LAB — MAGNESIUM: Magnesium: 2.3 mg/dL (ref 1.7–2.4)

## 2023-12-19 MED ORDER — CALCIUM GLUCONATE-NACL 2-0.675 GM/100ML-% IV SOLN
2.0000 g | Freq: Once | INTRAVENOUS | Status: AC
  Administered 2023-12-19: 2000 mg via INTRAVENOUS
  Filled 2023-12-19: qty 100

## 2023-12-19 MED ORDER — GLUCAGON HCL RDNA (DIAGNOSTIC) 1 MG IJ SOLR: 1.0000 mg | INTRAMUSCULAR | Status: DC | PRN

## 2023-12-19 MED ORDER — DIGOXIN 0.0625 MG HALF TABLET
0.0625 mg | ORAL_TABLET | Freq: Every day | ORAL | Status: DC
  Administered 2023-12-19 – 2023-12-28 (×10): 0.0625 mg via ORAL
  Filled 2023-12-19 (×10): qty 1

## 2023-12-19 MED ORDER — METOPROLOL TARTRATE 5 MG/5ML IV SOLN: 5.0000 mg | INTRAVENOUS | Status: DC | PRN

## 2023-12-19 MED ORDER — SPIRONOLACTONE 12.5 MG HALF TABLET
12.5000 mg | ORAL_TABLET | Freq: Every day | ORAL | Status: DC
  Administered 2023-12-19: 12.5 mg via ORAL
  Filled 2023-12-19: qty 1

## 2023-12-19 MED ORDER — GUAIFENESIN 100 MG/5ML PO LIQD: 5.0000 mL | ORAL | Status: DC | PRN

## 2023-12-19 MED ORDER — FUROSEMIDE 10 MG/ML IJ SOLN
80.0000 mg | Freq: Two times a day (BID) | INTRAMUSCULAR | Status: DC
  Administered 2023-12-19 – 2023-12-21 (×5): 80 mg via INTRAVENOUS
  Filled 2023-12-19 (×5): qty 8

## 2023-12-19 MED ORDER — HYDRALAZINE HCL 20 MG/ML IJ SOLN: 10.0000 mg | INTRAMUSCULAR | Status: DC | PRN

## 2023-12-19 NOTE — TOC Progression Note (Addendum)
 Transition of Care [facility]) - Progression Note    Patient Details  Name: Douglas [name] MRN: [id number] Date of Birth: 1956/01/23  Transition of Care [facility]) CM/SW Contact  [name] Phone Number: [phone] 12/19/2023, 12:33 PM  Clinical Narrative:   HF CSW met with the patient at bedside to discuss accepted bed offers. Patient stated that he was not pleased with the offers due to them not being closer to his home. CSW explained the SNF process. Patient stated that he understood but was not happy about locations. CSW explained to the patient that as he gets closer to being medically ready for dc we will have to revisit the list to see if any offers changed status.                                  Skilled Nursing Rehab Facilities-   [email or web address]  ?  ?  Ratings out of 5 stars (5 the highest)  ?   Name  Address   Phone #  Quality Care  Staffing  Health Inspection  Overall   [facility]  [address] [place], [place]  [phone]  4  2  2  2    [facility])  [address], [place]  [phone]  5  3  4  5      HF CSW/CM will continue to follow and monitor and follow for dc readiness.   Expected Discharge Plan: Skilled Nursing Facility Barriers to Discharge: Continued Medical Work up               Expected Discharge Plan and Services   Discharge Planning Services: CM Consult Post Acute Care Choice: Skilled Nursing Facility Living arrangements for the past 2 months: Single Family Home                                       Social Drivers of Health (SDOH) Interventions SDOH Screenings   Food Insecurity: No Food Insecurity (12/13/2023)  Housing: Low Risk  (12/13/2023)  Transportation Needs: No Transportation Needs (12/13/2023)  Utilities: Not At Risk (12/13/2023)  Depression (PHQ2-9): Medium Risk (11/14/2023)  Social Connections: Moderately Isolated (12/13/2023)  Tobacco Use: Low Risk  (12/13/2023)     Readmission Risk Interventions    05/31/2023   11:35 AM 05/30/2023   10:20 AM  Readmission Risk Prevention Plan  Transportation Screening Complete Complete  Home Care Screening Complete Complete  Medication Review (RN CM) Complete Complete

## 2023-12-19 NOTE — Progress Notes (Addendum)
 Patient ID: Douglas [name], male   DOB: August 21, 1955, 68 y.o.   MRN: [id number]     Advanced Heart Failure Rounding Note  Cardiologist: Danelle Birmingham, MD   Chief Complaint: CHF  Subjective:    10/19 patient was ambulating to the bathroom with his walker. RN witnessed him fall face first and lose consciousness. CT head okay. On tele review: VT treated with ATP  10/22 R/LHC: patent bypass grafts, severe native disease, severe BiV failure with severely reduced CO w/ elevated filling pressures.  Feeling well today. States he was able to ambulate yesterday. Feels main limitation is orthopedic pain.  CO-OX 55%, CVP 9.   Objective:   Weight Range: 113.8 kg Body mass index is 37.04 kg/m.   Vital Signs:   Temp:  [97.6 F (36.4 C)-98.1 F (36.7 C)] 98 F (36.7 C) (10/23 0825) Pulse Rate:  [57-68] 64 (10/23 0825) Resp:  [17-20] 19 (10/23 0825) BP: (97-156)/(66-86) 109/68 (10/23 0825) SpO2:  [91 %-95 %] 93 % (10/23 0825) Weight:  [113.8 kg-118.4 kg] 113.8 kg (10/23 0506) Last BM Date : 12/18/23  Weight change: Filed Weights   12/18/23 0350 12/19/23 0310 12/19/23 0506  Weight: 116 kg 118.4 kg 113.8 kg    Intake/Output:   Intake/Output Summary (Last 24 hours) at 12/19/2023 1011 Last data filed at 12/19/2023 0941 Gross per 24 hour  Intake 313.03 ml  Output 700 ml  Net -386.97 ml      Physical Exam    General:  Chronically ill appearing Cor: JVP to midneck. Irregular rhythm. No murmurs. Lungs: breathing nonlabored Abdomen: obese, soft, nontender, nondistended.  Extremities: 1+ edema Neuro: alert & orientedx3. Affect pleasant    Telemetry   AF 60s, intermittent V paced  Labs    CBC Recent Labs    12/18/23 0251 12/18/23 0931 12/18/23 0939 12/19/23 0241  WBC 6.9  --   --  7.5  HGB 12.7*   < > 10.9* 12.1*  HCT 42.4   < > 32.0* 40.3  MCV 82.3  --   --  81.7  PLT 243  --   --  233   < > = values in this interval not displayed.   Basic Metabolic  Panel Recent Labs    12/18/23 0251 12/18/23 0931 12/18/23 0939 12/19/23 0241  NA 131*   < > 148* 133*  K 5.1   < > 2.7* 4.4  CL 91*  --   --  94*  CO2 29  --   --  29  GLUCOSE 117*  --   --  106*  BUN 46*  --   --  43*  CREATININE 1.63*  --   --  1.40*  CALCIUM  8.9  --   --  8.8*  MG 2.4  --   --  2.3   < > = values in this interval not displayed.   Liver Function Tests No results for input(s): AST, ALT, ALKPHOS, BILITOT, PROT, ALBUMIN in the last 72 hours.  No results for input(s): LIPASE, AMYLASE in the last 72 hours.  Cardiac Enzymes No results for input(s): CKTOTAL, CKMB, CKMBINDEX, TROPONINI in the last 72 hours.  BNP: BNP (last 3 results) Recent Labs    04/23/23 1840 05/28/23 0322 05/29/23 0413  BNP 253.0* 557.0* 672.0*    ProBNP (last 3 results) Recent Labs    12/12/23 1234  PROBNP 5,740.0*     D-Dimer No results for input(s): DDIMER in the last 72 hours. Hemoglobin A1C No results for  input(s): HGBA1C in the last 72 hours. Fasting Lipid Panel No results for input(s): CHOL, HDL, LDLCALC, TRIG, CHOLHDL, LDLDIRECT in the last 72 hours. Thyroid Function Tests No results for input(s): TSH, T4TOTAL, T3FREE, THYROIDAB in the last 72 hours.  Invalid input(s): FREET3  Other results:  Imaging  No results found.   Medications:   Scheduled Medications:  amiodarone  400 mg Oral BID   apixaban   5 mg Oral BID   Chlorhexidine  Gluconate Cloth  6 each Topical Daily   citalopram   20 mg Oral Daily   empagliflozin   10 mg Oral Daily   ezetimibe   10 mg Oral Daily   fluticasone   1 spray Each Nare Daily   fluticasone  furoate-vilanterol  1 puff Inhalation Daily   metoprolol  succinate  25 mg Oral Daily   mexiletine  150 mg Oral Q12H   pantoprazole   20 mg Oral Daily   rosuvastatin   40 mg Oral Daily   senna-docusate  1 tablet Oral QHS   tamsulosin   0.4 mg Oral Daily   torsemide   20 mg Oral Daily   traZODone   50  mg Oral QHS    Infusions:  sodium chloride      calcium  gluconate 2,000 mg (12/19/23 1009)     PRN Medications: sodium chloride , acetaminophen , albuterol , ALPRAZolam , alum & mag hydroxide-simeth, artificial tears, glucagon (human recombinant), guaiFENesin, hydrALAZINE, metoprolol  tartrate, ondansetron  **OR** ondansetron  (ZOFRAN ) IV, mouth rinse, oxyCODONE -acetaminophen  **AND** oxyCODONE , polyethylene glycol, sodium chloride , tiZANidine  Assessment/Plan   1. Acute on chronic systolic CHF: Ischemic cardiomyopathy.  Medtronic ICD.  He has been on minimal GDMT at home, apparently limited by low BP.  Patient has a long-standing cardiomyopathy, echoes with EF in the 20-25% range since at least 2015.  By Dr. Rolan review of echo this admission, EF 25% with moderate RV dysfunction and dilated IVC. R/LHC 10/22: Patent bypass grafts, severe BiV failure with elevated filling pressures, Fick CI 1.6, PAPi 2.1 - Remains tenuous, CO-OX 55% - Needs ongoing diuresis. Give 80 mg lasix  IV BID. - Titrate GDMT as tolerated. Spiro stopped with elevated K but now resolved. Will retrial 12.5 mg daily. - Decreased metoprolol  xl to 25 mg daily (would not increase any higher with low-output HF) - Continue jardiance  10 mg daily - I am very worried about his trajectory and ability to return home alone. He has end-stage HF.  Palliative Care now on board. Appreciate assistance.   2. VT: Patient has permanent AF but was noted to have a run of VT this admission treated with ATP (dual tachycardia).  Seen by EP. He was stated on amiodarone, now po.  Trigger for VT may have been drastic drop recently in Toprol  XL dose 200 mg daily => 12.5 daily.   - Another run of VT 10/19 w/ syncopal event, looks like treated with ATP (initiated with PMVT) - Previously on 200 mg metoprolol  xl daily, decreased to 25 mg daily this admit (would not increase any further with low-output HF - No recurrent VT. Continue amiodarone 400 mg BID per EP,  and mexiletine 150 BID.  3. Atrial fibrillation: Permanent.  He is currently rate-controlled.  - Continue apixaban  - Toprol  XL as above  4. AKI: Creatinine jumped 1.09 => 1.86 => 2.02 with elevated K.  He received Lokelma.  Foley placed, 500 cc urine out and abdominal pain resolved.   - Scr now 1.4 - Now on Flomax  for suspected BPH.  - Follow closely with cath and diuresis  5. CAD: H/o CABG 2002.  Last  cath in 2013 with occluded LAD and RCA, 2 occluded distal OMs.  No chest pain.  Troponin minimally elevated with no trend, suspect demand ischemia.  - Grafts are LIMA - LAD, SVG -Diag, SVG - OM2 -OM3. Patent on cath 12/18/23. - No aspirin  with need for anticoagulation - Continue Zetia  and Crestor    Length of Stay: 7  FINCH, LINDSAY N, PA-C  12/19/2023, 10:11 AM  Advanced Heart Failure Team Pager [phone] (M-F; 7a - 5p)  Please contact [facility] for night-coverage after hours (5p -7a ) and weekends on [email or web address]  Patient seen with PA, I formulated the plan and agree with the above note.  CVP 15 on my read today, co-ox 55%.    Stable coronary disease on cath yesterday but elevated filling pressures and low cardiac output.   General: NAD Neck: JVP 12-14 cm, no thyromegaly or thyroid nodule.  Lungs: Clear to auscultation bilaterally with normal respiratory effort. CV: Nondisplaced PMI.  Heart irregular S1/S2, no S3/S4, no murmur.  Trace ankle edema.  Abdomen: Soft, nontender, no hepatosplenomegaly, no distention.  Skin: Intact without lesions or rashes.  Neurologic: Alert and oriented x 3.  Psych: Normal affect. Extremities: No clubbing or cyanosis.  HEENT: Normal.   Patient remains volume overloaded, creatinine lower at 1.4.  Will give Lasix  80 mg IV bid today.    Low cardiac output by RHC.  He is not a good candidate for advanced therapies, he has been minimally ambulatory at home.  Co-ox 55% today.  I will try to avoid milrinone with VT.   I will start digoxin 0.0625  today. Restart spironolactone  at 12.5 mg daily.   No further VT.  Would not increase Toprol  XL with low output HF.  Continue amiodarone and mexiletine.   Chronic AF, on apixaban .   Ezra Shuck 12/19/2023 11:06 AM

## 2023-12-19 NOTE — Progress Notes (Addendum)
 This chaplain responded to PMT NP-Julia consult for creating the Pt. Advance Directive: HCPOA. The chaplain received an update from the RN before the visit. The chaplain understands the Pt. is in the bathroom at the time of the visit. This chaplain will plan a revisit.  **1227 This chaplain attempted a revisit for creating the Pt. AD. The chaplain understands from the RN the Pt. has returned to the bathroom.  Chaplain Leeroy Hummer [phone]

## 2023-12-19 NOTE — Plan of Care (Signed)

## 2023-12-19 NOTE — Progress Notes (Signed)
 Mobility Specialist Progress Note:    12/19/23 1129  Mobility  Activity Ambulated with assistance  Level of Assistance Contact guard assist, steadying assist  Assistive Device Front wheel walker  Distance Ambulated (ft) 100 ft  Activity Response Tolerated fair  Mobility Referral Yes  Mobility visit 1 Mobility  Mobility Specialist Start Time (ACUTE ONLY) 1129  Mobility Specialist Stop Time (ACUTE ONLY) 1145  Mobility Specialist Time Calculation (min) (ACUTE ONLY) 16 min   Received pt requesting assistance from restroom agreeable to session. No c/o any symptoms. Pt moving and ambulating slowly but well. Returned pt to bed w/ all needs met.   Venetia Keel Mobility Specialist Please Neurosurgeon or Rehab Office at [phone]

## 2023-12-19 NOTE — Plan of Care (Signed)
   Problem: Education: Goal: Knowledge of General Education information will improve Description: Including pain rating scale, medication(s)/side effects and non-pharmacologic comfort measures Outcome: Progressing   Problem: Coping: Goal: Level of anxiety will decrease Outcome: Progressing

## 2023-12-19 NOTE — Progress Notes (Addendum)
 Physical Therapy Treatment Patient Details Name: Douglas [name] MRN: [id number] DOB: [birth year] Today's Date: 12/19/2023   History of Present Illness 68 y.o. M admitted 12/12/23 with abdominal pain, N/V. Had VTach and Afib in ED. P twith decompensated CHF. 10/19 syncope with Vtach. 10/22 L/RHC. PMHx: obesity, T2DM, HTN, HLD, Afib on Eliquis , CAD s/p CABG, ICM, CHF, AICD, anemia, asthma, opiate dependence    PT Comments  Pt pleasant, talkative and progressing with all mobility. Pt able to walk long hall distance without AD, perform transfers and bed mobility unassisted. Pt reports having some plumbing and construction type repair issues at home and needing assist with meals. HHPT is appropriate for current functional mobility and progression. Will continue to follow.   SPO2 89-93% HR 78-84    If plan is discharge home, recommend the following: Assistance with cooking/housework;Assist for transportation   Can travel by private vehicle        Equipment Recommendations  None recommended by PT    Recommendations for Other Services       Precautions / Restrictions Precautions Precautions: Fall;Other (comment) Recall of Precautions/Restrictions: Intact Precaution/Restrictions Comments: 10/22 Lt radial heart cath     Mobility  Bed Mobility Overal bed mobility: Modified Independent Bed Mobility: Supine to Sit, Sit to Supine     Supine to sit: Modified independent (Device/Increase time) Sit to supine: Modified independent (Device/Increase time)   General bed mobility comments: pt able to transition supine<>sit without assist with bed flat    Transfers Overall transfer level: Modified independent                 General transfer comment: pt able to stand from bed with limited UB use and from bed    Ambulation/Gait Ambulation/Gait assistance: Contact guard assist Gait Distance (Feet): 400 Feet Assistive device: None Gait Pattern/deviations: Step-through pattern,  Decreased stride length, Wide base of support   Gait velocity interpretation: 1.31 - 2.62 ft/sec, indicative of limited community ambulator   General Gait Details: pt with increased lateral sway of gait which he reports is baseline due to back injuries. pt able to walk on RA with SPO2 89-93% and HR 78-84   Stairs             Wheelchair Mobility     Tilt Bed    Modified Rankin (Stroke Patients Only)       Balance Overall balance assessment: Mild deficits observed, not formally tested   Sitting balance-Leahy Scale: Good       Standing balance-Leahy Scale: Good                              Communication Communication Communication: No apparent difficulties  Cognition Arousal: Alert Behavior During Therapy: WFL for tasks assessed/performed   PT - Cognitive impairments: No apparent impairments                         Following commands: Intact      Cueing Cueing Techniques: Verbal cues  Exercises General Exercises - Lower Extremity Long Arc Quad: AROM, Both, Seated, 20 reps, Strengthening Hip Flexion/Marching: AROM, Both, Seated, Strengthening, 20 reps    General Comments        Pertinent Vitals/Pain Pain Assessment Pain Assessment: No/denies pain    Home Living                          Prior  Function            PT Goals (current goals can now be found in the care plan section) Progress towards PT goals: Progressing toward goals    Frequency    Min 2X/week      PT Plan      Co-evaluation              AM-PAC PT 6 Clicks Mobility   Outcome Measure  Help needed turning from your back to your side while in a flat bed without using bedrails?: None Help needed moving from lying on your back to sitting on the side of a flat bed without using bedrails?: None Help needed moving to and from a bed to a chair (including a wheelchair)?: None Help needed standing up from a chair using your arms (e.g.,  wheelchair or bedside chair)?: A Little Help needed to walk in hospital room?: A Little Help needed climbing 3-5 steps with a railing? : A Little 6 Click Score: 21    End of Session   Activity Tolerance: Patient tolerated treatment well Patient left: in chair;with call bell/phone within reach Nurse Communication: Mobility status PT Visit Diagnosis: Muscle weakness (generalized) (M62.81)     Time: 9140-9080 PT Time Calculation (min) (ACUTE ONLY): 20 min  Charges:    $Gait Training: 8-22 mins PT General Charges $$ ACUTE PT VISIT: 1 Visit                     Lenoard SQUIBB, PT Acute Rehabilitation Services Office: [phone]    Lenoard NOVAK Terease Marcotte 12/19/2023, 11:03 AM

## 2023-12-19 NOTE — Hospital Course (Addendum)
 Brief Narrative:   68 year old with history of DM2, HTN, A-fib on Eliquis , CAD, ischemic cardiomyopathy, CHF status post AICD EF 35%, asthma, anemia of chronic disease, opioid dependence comes to the ED for acute respiratory failure.  Patient was found to be in CHF exacerbation and had concerns for recurrent ventricular tachycardia with syncope.  Patient was transferred from [facility] to [facility] for evaluation by EP, CHF and palliative care team.  Assessment & Plan:  Acute exacerbation of chronic systolic CHF Hypertension -Echocardiogram shows reduced EF of 35%, cardiology team is following and adjusting diuretics as necessary.  A-fib with RVR  Permanent A-fib Recurrent ventricular tachycardia Unfortunate recurrent ventricular tachycardia complicated by atrial fibrillation.  Also had an episode of syncope.  EP team has been consulted.  Currently on amiodarone for 100 mg twice daily for 14 days thereafter 200 mg once daily, mexiletine 150 mg twice daily and Toprol -XL. - Okay to resume Eliquis  once okay with cardiology   10/22, underwent RHC/LHC found to have severe native three-vessel CAD with severe ischemic cardiomyopathy, continued patency of all 4 bypass grafts, severe IV failure with severely decreased cardiac output with elevated filling pressures -suggestive of end-stage CHF  Goals of care discussion - Palliative care team following     Elevated troponin H/o CAD s/p PCI/CABG HLD Left heart catheterization results as mentioned above   Acute respiratory failure with hypoxia  Improved.  Initially required 3 L nasal cannula now on room air   AKI Baseline creatinine 1.0, peaked at 2.02 now improving with diuretics  HypOkalemia/hypocalcemia Replete as needed  Abdominal pain Primarily presented with lower abdominal pain, difficulty with voiding.   CT abdomen and pelvis without contrast negative for acute abnormality.   Urinary bladder, ureters kidneys without signs  of obstruction. LFTs and lipase were normal   Type 2 diabetes mellitus A1c 5.9 on April 2025 PTA meds-Jardiance  10 mg daily Continue SSI/Accu-Cheks  Chronic low back pain Chronic opiate dependence PT/OT-home health Pain medications   Constipation Start on scheduled and as needed bowel regimen   Anxiety/depression PTA meds-Celexa  20 mg daily, Xanax  1 mg twice daily as needed Currently on same   Obesity 2 Body mass index is 37.77 kg/m. Patient has been advised to make an attempt to improve diet and exercise patterns to aid in weight loss.     DVT prophylaxis: apixaban  (ELIQUIS ) tablet 5 mg     Code Status: Full Code Family Communication:   Status is: Inpatient Still having signs of volume overload requiring IV Lasix .   PT Follow up Recs: Home Health Pt10/20/2025 1600, face-to-face completed  Subjective: Sitting up in the recliner Denies any shortness of breath at rest   Examination:  General exam: Appears calm and comfortable  Respiratory system: Bibasilar crackles Cardiovascular system: S1 & S2 heard, RRR. No JVD, murmurs, rubs, gallops or clicks.  Mild bilateral trace edema Gastrointestinal system: Abdomen is nondistended, soft and nontender. No organomegaly or masses felt. Normal bowel sounds heard. Central nervous system: Alert and oriented. No focal neurological deficits. Extremities: Symmetric 5 x 5 power. Skin: No rashes, lesions or ulcers Psychiatry: Judgement and insight appear normal. Mood & affect appropriate.

## 2023-12-19 NOTE — Progress Notes (Addendum)
 Patient Name: Douglas [name] Date of Encounter: 12/19/2023  Primary Cardiologist: Danelle Birmingham, MD Electrophysiologist: None  Interval Summary   The patient reports he is doing okay today.  At this time, the patient denies chest pain, shortness of breath, or any new concerns. He expresses frustration with his perceived delay in response from nursing staff when he presses his call button.   Vital Signs    Vitals:   12/18/23 2350 12/19/23 0310 12/19/23 0425 12/19/23 0506  BP: 103/70  97/66   Pulse: 60  (!) 58   Resp: 18  18   Temp: 97.9 F (36.6 C)  97.6 F (36.4 C)   TempSrc: Oral  Oral   SpO2: 91%  93%   Weight:  118.4 kg  113.8 kg  Height:        Intake/Output Summary (Last 24 hours) at 12/19/2023 0804 Last data filed at 12/19/2023 9377 Gross per 24 hour  Intake 193.03 ml  Output 700 ml  Net -506.97 ml   Filed Weights   12/18/23 0350 12/19/23 0310 12/19/23 0506  Weight: 116 kg 118.4 kg 113.8 kg    Physical Exam    GEN- NAD, Alert and oriented  Lungs- Clear to ausculation bilaterally, normal work of breathing Cardiac- Irregularly irregular rate and rhythm, no murmurs, rubs or gallops GI- soft, NT, ND, + BS Extremities- no clubbing or cyanosis. No edema  Telemetry    Permanent atrial fibrillation, rare V pacing. HR in the 60s (personally reviewed)  Hospital Course    Douglas [name] is a 68 y.o. male with history of CAD (s/p remote PCI and CABG), ICM, chronic CHF, VT s/p ICD, permanent afib, hypertension, hyperlipidemia, asthma admitted for acute CHF and VT.   Assessment & Plan    Ventricular tachycardia End stage CHF Patient presenting with GI symptoms admitted for management of decompensated CHF. Patient developed VT successfully treated with ATP. He was subsequently started on Amiodarone and transferred to [facility]. Upon transfer, device report showed at least 1 episode of VT successfully treated with VT. Initially VT suspected secondary to CHF. IV  Amiodarone transitioned to PO on 12/14/23 and patient resumed on low dose Toprol . VT initially attributed to CHF and medication adjustments. However, on 10/19 patient had syncope with recurrent fast VT ( , in VF zone) that was successfully terminated with ATP while charging. IV Amiodarone resumed. Mexiletine 150mg  BID started. No recurrent VT. LHC with patient grafts x4. Severe biventricular failure with severely decreased cardiac output and elevated filling pressures. HF team assistance appreciated, they have consulted palliative care. Continue Amiodarone 400mg  BID for the next 13 days, then 200mg  daily. Continue Mexiletine 150mg  BID Metoprolol  stopped given severely decreased cardiac output on RHC  CAD Remote hx PCI and CABG, 4/4 grafts patent by LHC at [facility] (40-46% in-graft stenosis SVT to OM-2. LHC 10/22 with patient grafts x4.  Permanent atrial fibrillation Stable/controlled ventricular rates. Eliquis  has been resumed s/p cath.    For questions or updates, please contact [facility] Please consult [email or web address] for contact info under     Signed, Artist Pouch, PA-C  12/19/2023, 8:04 AM     I have seen, examined the patient, and reviewed the above assessment and plan.    Interval:  No acute overnight events. Patient reports feeling better today than he has in some time. No new or acute complaints.   No ventricular arrhythmias on telemetry.  General: Well developed, in no acute distress.  Neck: No JVD.  Cardiac: Normal rate,  regular rhythm.  Resp: Normal work of breathing.  Ext: No edema.  Neuro: No gross focal deficits.  Psych: Normal affect.   Assessment:  Douglas [name] is a 68 year old male with past medical history notable for chronic systolic heart failure in the setting of ischemic cardiomyopathy status post ICD.  He was admitted with acute decompensated heart failure.  Hospital course has been complicated by episodes of nonsustained VT which  necessitated ATP therapies.  1 such occasion resulted in a episode of syncope.  The syncopal episode appears to have started with polymorphic ventricular tachycardia before organizing into a more fast monomorphic VT.  This episode occurred while he was having a bowel movement.  Ischemic etiology was ruled out with no newly obstructive disease present on heart catheterization.  Etiology is likely scar mediated VT and decompensated heart failure.  Patient found to have low cardiac output/index on his heart cath.  Unfortunately, he is not a candidate for advanced therapies given his comorbidities.  Palliative care consultation seems appropriate, especially if he were to have recurrence of ventricular tachycardia despite aggressive medical therapy.   Problem List:  Ventricular tachycardia Permanent atrial fibrillation Acute on chronic systolic heart failure Ischemic cardiomyopathy Status post ICD   Plan:  - Continue amiodarone 400mg  BID for total of 14 days, then transition to 200mg  once daily.  - Continue mexiletine 150 mg twice daily. - Metoprolol  has been discontinued due to low cardiac output/index on right heart catheterization. - Continue optimization of his heart failure status.  Appreciate assistance from HF team. - Patient will follow-up with EP after discharge.   Fonda Kitty, MD 12/19/2023 11:32 PM

## 2023-12-19 NOTE — Progress Notes (Signed)
 PROGRESS NOTE    Douglas [name]  FMW:[id number] DOB: September 30, 1955 DOA: 12/12/2023 PCP: Mancil Pfeiffer, PA-C    Brief Narrative:   68 year [name] with history of DM2, HTN, A-fib on Eliquis , CAD, ischemic cardiomyopathy, CHF status post AICD EF 35%, asthma, anemia of chronic disease, opioid dependence comes to the ED for acute respiratory failure.  Patient was found to be in CHF exacerbation and had concerns for recurrent ventricular tachycardia with syncope.  Patient was transferred from [facility] to [facility] for evaluation by EP, CHF and palliative care team.  Assessment & Plan:  Acute exacerbation of chronic systolic CHF Hypertension -Echocardiogram shows reduced EF of 35%, cardiology team is following and adjusting diuretics as necessary.  A-fib with RVR  Permanent A-fib Recurrent ventricular tachycardia Unfortunate recurrent ventricular tachycardia complicated by atrial fibrillation.  Also had an episode of syncope.  EP team has been consulted.  Currently on amiodarone for 100 mg twice daily for 14 days thereafter 200 mg once daily, mexiletine 150 mg twice daily and Toprol -XL. - Okay to resume Eliquis  once okay with cardiology   10/22, underwent RHC/LHC found to have severe native three-vessel CAD with severe ischemic cardiomyopathy, continued patency of all 4 bypass grafts, severe IV failure with severely decreased cardiac output with elevated filling pressures -suggestive of end-stage CHF  Goals of care discussion - Palliative care team following     Elevated troponin H/o CAD s/p PCI/CABG HLD Left heart catheterization results as mentioned above   Acute respiratory failure with hypoxia  Improved.  Initially required 3 L nasal cannula now on room air   AKI Baseline creatinine 1.0, peaked at 2.02 now improving with diuretics  HypOkalemia/hypocalcemia Replete as needed  Abdominal pain Primarily presented with lower abdominal pain, difficulty with voiding.    CT abdomen and pelvis without contrast negative for acute abnormality.   Urinary bladder, ureters kidneys without signs of obstruction. LFTs and lipase were normal   Type 2 diabetes mellitus A1c 5.9 on April 2025 PTA meds-Jardiance  10 mg daily Continue SSI/Accu-Cheks  Chronic low back pain Chronic opiate dependence PT/OT-home health Pain medications   Constipation Start on scheduled and as needed bowel regimen   Anxiety/depression PTA meds-Celexa  20 mg daily, Xanax  1 mg twice daily as needed Currently on same   Obesity 2 Body mass index is 37.77 kg/m. Patient has been advised to make an attempt to improve diet and exercise patterns to aid in weight loss.     DVT prophylaxis: apixaban  (ELIQUIS ) tablet 5 mg     Code Status: Full Code Family Communication:   Status is: Inpatient Still having signs of volume overload requiring IV Lasix .   PT Follow up Recs: Home Health Pt10/20/2025 1600, face-to-face completed  Subjective: Sitting up in the recliner Denies any shortness of breath at rest   Examination:  General exam: Appears calm and comfortable  Respiratory system: Bibasilar crackles Cardiovascular system: S1 & S2 heard, RRR. No JVD, murmurs, rubs, gallops or clicks.  Mild bilateral trace edema Gastrointestinal system: Abdomen is nondistended, soft and nontender. No organomegaly or masses felt. Normal bowel sounds heard. Central nervous system: Alert and oriented. No focal neurological deficits. Extremities: Symmetric 5 x 5 power. Skin: No rashes, lesions or ulcers Psychiatry: Judgement and insight appear normal. Mood & affect appropriate.                Diet Orders (From admission, onward)     Start     Ordered   12/18/23 1052  Diet  Heart Room service appropriate? Yes; Fluid consistency: Thin  Diet effective now       Question Answer Comment  Room service appropriate? Yes   Fluid consistency: Thin      12/18/23 1051             Objective: Vitals:   12/19/23 0425 12/19/23 0506 12/19/23 0819 12/19/23 0825  BP: 97/66   109/68  Pulse: (!) 58  62 64  Resp: 18  19 19   Temp: 97.6 F (36.4 C)   98 F (36.7 C)  TempSrc: Oral   Oral  SpO2: 93%  95% 93%  Weight:  113.8 kg    Height:        Intake/Output Summary (Last 24 hours) at 12/19/2023 1143 Last data filed at 12/19/2023 0941 Gross per 24 hour  Intake 313.03 ml  Output 700 ml  Net -386.97 ml   Filed Weights   12/18/23 0350 12/19/23 0310 12/19/23 0506  Weight: 116 kg 118.4 kg 113.8 kg    Scheduled Meds:  amiodarone  400 mg Oral BID   apixaban   5 mg Oral BID   Chlorhexidine  Gluconate Cloth  6 each Topical Daily   citalopram   20 mg Oral Daily   digoxin  0.0625 mg Oral Daily   empagliflozin   10 mg Oral Daily   ezetimibe   10 mg Oral Daily   fluticasone   1 spray Each Nare Daily   fluticasone  furoate-vilanterol  1 puff Inhalation Daily   furosemide   80 mg Intravenous BID   metoprolol  succinate  25 mg Oral Daily   mexiletine  150 mg Oral Q12H   pantoprazole   20 mg Oral Daily   rosuvastatin   40 mg Oral Daily   senna-docusate  1 tablet Oral QHS   spironolactone   12.5 mg Oral Daily   tamsulosin   0.4 mg Oral Daily   traZODone   50 mg Oral QHS   Continuous Infusions:  Nutritional status     Body mass index is 37.04 kg/m.  Data Reviewed:   CBC: Recent Labs  Lab 12/12/23 1234 12/13/23 9356 12/15/23 0510 12/16/23 0215 12/17/23 0256 12/18/23 0251 12/18/23 0931 12/18/23 0939 12/19/23 0241  WBC 7.2   < > 7.6 8.2 9.1 6.9  --   --  7.5  NEUTROABS 4.6  --   --   --   --   --   --   --   --   HGB 14.8   < > 12.9* 12.5* 12.7* 12.7* 15.3  15.0 10.9* 12.1*  HCT 48.9   < > 44.2 42.8 43.1 42.4 45.0  44.0 32.0* 40.3  MCV 81.9   < > 82.9 83.4 83.0 82.3  --   --  81.7  PLT 314   < > 252 258 282 243  --   --  233   < > = values in this interval not displayed.   Basic Metabolic Panel: Recent Labs  Lab 12/15/23 0510 12/16/23 0215  12/17/23 0256 12/18/23 0251 12/18/23 0931 12/18/23 0939 12/19/23 0241  NA 134* 133* 131* 131* 135  135 148* 133*  K 4.2 4.8 4.8 5.1 4.2  4.1 2.7* 4.4  CL 95* 93* 92* 91*  --   --  94*  CO2 30 30 31 29   --   --  29  GLUCOSE 109* 123* 109* 117*  --   --  106*  BUN 29* 32* 36* 46*  --   --  43*  CREATININE 1.34* 1.40* 1.56* 1.63*  --   --  1.40*  CALCIUM  8.9 8.7* 9.0 8.9  --   --  8.8*  MG 2.0 1.7 2.6* 2.4  --   --  2.3   GFR: Estimated Creatinine Clearance: 63.7 mL/min (A) (by C-G formula based on SCr of 1.4 mg/dL (H)). Liver Function Tests: Recent Labs  Lab 12/12/23 1234  AST 31  ALT 13  ALKPHOS 107  BILITOT 1.5*  PROT 8.8*  ALBUMIN 4.3   Recent Labs  Lab 12/12/23 1234  LIPASE 16   No results for input(s): AMMONIA in the last 168 hours. Coagulation Profile: No results for input(s): INR, PROTIME in the last 168 hours. Cardiac Enzymes: No results for input(s): CKTOTAL, CKMB, CKMBINDEX, TROPONINI in the last 168 hours. BNP (last 3 results) Recent Labs    12/12/23 1234  PROBNP 5,740.0*   HbA1C: No results for input(s): HGBA1C in the last 72 hours. CBG: Recent Labs  Lab 12/12/23 2146 12/13/23 1140  GLUCAP 142* 128*   Lipid Profile: No results for input(s): CHOL, HDL, LDLCALC, TRIG, CHOLHDL, LDLDIRECT in the last 72 hours. Thyroid Function Tests: No results for input(s): TSH, T4TOTAL, FREET4, T3FREE, THYROIDAB in the last 72 hours. Anemia Panel: No results for input(s): VITAMINB12, FOLATE, FERRITIN, TIBC, IRON, RETICCTPCT in the last 72 hours. Sepsis Labs: Recent Labs  Lab 12/13/23 1503 12/13/23 1756  LATICACIDVEN 1.4 1.5    Recent Results (from the past 240 hours)  Resp panel by RT-PCR (RSV, Flu A&B, Covid) Anterior Nasal Swab     Status: None   Collection Time: 12/12/23 12:04 PM   Specimen: Anterior Nasal Swab  Result Value Ref Range Status   SARS Coronavirus 2 by RT PCR NEGATIVE NEGATIVE Final     Comment: (NOTE) SARS-CoV-2 target nucleic acids are NOT DETECTED.  The SARS-CoV-2 RNA is generally detectable in upper respiratory specimens during the acute phase of infection. The lowest concentration of SARS-CoV-2 viral copies this assay can detect is 138 copies/mL. A negative result does not preclude SARS-Cov-2 infection and should not be used as the sole basis for treatment or other patient management decisions. A negative result may occur with  improper specimen collection/handling, submission of specimen other than nasopharyngeal swab, presence of viral mutation(s) within the areas targeted by this assay, and inadequate number of viral copies(<138 copies/mL). A negative result must be combined with clinical observations, patient history, and epidemiological information. The expected result is Negative.  Fact Sheet for Patients:  BloggerCourse.com  Fact Sheet for Healthcare Providers:  SeriousBroker.it  This test is no t yet approved or cleared by the [place]  FDA and  has been authorized for detection and/or diagnosis of SARS-CoV-2 by FDA under an Emergency Use Authorization (EUA). This EUA will remain  in effect (meaning this test can be used) for the duration of the COVID-19 declaration under Section 564(b)(1) of the Act, 21 U.S.C.section 360bbb-3(b)(1), unless the authorization is terminated  or revoked sooner.       Influenza A by PCR NEGATIVE NEGATIVE Final   Influenza B by PCR NEGATIVE NEGATIVE Final    Comment: (NOTE) The Xpert Xpress SARS-CoV-2/FLU/RSV plus assay is intended as an aid in the diagnosis of influenza from Nasopharyngeal swab specimens and should not be used as a sole basis for treatment. Nasal washings and aspirates are unacceptable for Xpert Xpress SARS-CoV-2/FLU/RSV testing.  Fact Sheet for Patients: BloggerCourse.com  Fact Sheet for Healthcare  Providers: SeriousBroker.it  This test is not yet approved or cleared by the [place]  FDA and has been authorized for  detection and/or diagnosis of SARS-CoV-2 by FDA under an Emergency Use Authorization (EUA). This EUA will remain in effect (meaning this test can be used) for the duration of the COVID-19 declaration under Section 564(b)(1) of the Act, 21 U.S.C. section 360bbb-3(b)(1), unless the authorization is terminated or revoked.     Resp Syncytial Virus by PCR NEGATIVE NEGATIVE Final    Comment: (NOTE) Fact Sheet for Patients: BloggerCourse.com  Fact Sheet for Healthcare Providers: SeriousBroker.it  This test is not yet approved or cleared by the [place]  FDA and has been authorized for detection and/or diagnosis of SARS-CoV-2 by FDA under an Emergency Use Authorization (EUA). This EUA will remain in effect (meaning this test can be used) for the duration of the COVID-19 declaration under Section 564(b)(1) of the Act, 21 U.S.C. section 360bbb-3(b)(1), unless the authorization is terminated or revoked.  Performed at [facility], [address], [place], [place] [address]   MRSA Next Gen by PCR, Nasal     Status: None   Collection Time: 12/12/23  9:50 PM   Specimen: Nasal Mucosa; Nasal Swab  Result Value Ref Range Status   MRSA by PCR Next Gen NOT DETECTED NOT DETECTED Final    Comment: (NOTE) The GeneXpert MRSA Assay (FDA approved for NASAL specimens only), is one component of a comprehensive MRSA colonization surveillance program. It is not intended to diagnose MRSA infection nor to guide or monitor treatment for MRSA infections. Test performance is not FDA approved in patients less than 44 years [name]. Performed at [facility], [address] [place], [place] [address]          Radiology Studies: CARDIAC CATHETERIZATION Result Date: 12/18/2023   Prox RCA lesion is 100%  stenosed.   Mid Cx lesion is 100% stenosed.   Prox LAD to Mid LAD lesion is 100% stenosed.   Mid LM lesion is 40% stenosed.   Prox Graft lesion is 30% stenosed.   Dist Graft lesion is 20% stenosed.   Origin to Prox Graft lesion is 30% stenosed. Findings: Ao = 99/65 (78) LV = 96/21 RA =  18 RV = 61/17 PA = 63/25 (48) PCW = 24 (v = 34) Fick cardiac output/index = 3.8/1.6 PVR = 6.4 WU Ao sat = 92% PA sat = 45%, 44% PAPi = 2.1 Assessment: 1. 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 Continues patency of all 4 bypass grafts 3. Severe biventricular failure with severely decreased cardiac output with elevated filling pressures Plan/Discussion: He is end-stage. Will continue diuresis. I am reluctant to add inotropes in setting of recent VT. Palliative Care consulted. Toribio Fuel, MD 10:46 AM           LOS: 7 days   Time spent= 35 mins    Douglas JAYSON Dare, MD [facility]  If 7PM-7AM, please contact night-coverage  12/19/2023, 11:43 AM

## 2023-12-20 DIAGNOSIS — I472 Ventricular tachycardia, unspecified: Secondary | ICD-10-CM | POA: Diagnosis not present

## 2023-12-20 LAB — CBC
HCT: 39.8 % (ref 39.0–52.0)
Hemoglobin: 11.9 g/dL — ABNORMAL LOW (ref 13.0–17.0)
MCH: 24.5 pg — ABNORMAL LOW (ref 26.0–34.0)
MCHC: 29.9 g/dL — ABNORMAL LOW (ref 30.0–36.0)
MCV: 81.9 fL (ref 80.0–100.0)
Platelets: 239 K/uL (ref 150–400)
RBC: 4.86 MIL/uL (ref 4.22–5.81)
RDW: 21.8 % — ABNORMAL HIGH (ref 11.5–15.5)
WBC: 6.5 K/uL (ref 4.0–10.5)
nRBC: 0 % (ref 0.0–0.2)

## 2023-12-20 LAB — COMPREHENSIVE METABOLIC PANEL WITH GFR
ALT: 20 U/L (ref 0–44)
AST: 29 U/L (ref 15–41)
Albumin: 3.1 g/dL — ABNORMAL LOW (ref 3.5–5.0)
Alkaline Phosphatase: 73 U/L (ref 38–126)
Anion gap: 11 (ref 5–15)
BUN: 35 mg/dL — ABNORMAL HIGH (ref 8–23)
CO2: 31 mmol/L (ref 22–32)
Calcium: 9.1 mg/dL (ref 8.9–10.3)
Chloride: 93 mmol/L — ABNORMAL LOW (ref 98–111)
Creatinine, Ser: 1.33 mg/dL — ABNORMAL HIGH (ref 0.61–1.24)
GFR, Estimated: 59 mL/min — ABNORMAL LOW (ref >60)
Glucose, Bld: 94 mg/dL (ref 70–99)
Potassium: 4.1 mmol/L (ref 3.5–5.1)
Sodium: 135 mmol/L (ref 135–145)
Total Bilirubin: 1.4 mg/dL — ABNORMAL HIGH (ref 0.0–1.2)
Total Protein: 7.1 g/dL (ref 6.5–8.1)

## 2023-12-20 LAB — COOXEMETRY PANEL
Carboxyhemoglobin: 2.2 % — ABNORMAL HIGH (ref 0.5–1.5)
Methemoglobin: <0.7 % (ref 0.0–1.5)
O2 Saturation: 74.4 %
Total hemoglobin: 12.9 g/dL (ref 12.0–16.0)

## 2023-12-20 LAB — MAGNESIUM
Magnesium: 2 mg/dL (ref 1.7–2.4)
Magnesium: 2 mg/dL (ref 1.7–2.4)

## 2023-12-20 LAB — PHOSPHORUS: Phosphorus: 4.2 mg/dL (ref 2.5–4.6)

## 2023-12-20 MED ORDER — METOLAZONE 2.5 MG PO TABS
2.5000 mg | ORAL_TABLET | Freq: Once | ORAL | Status: AC
  Administered 2023-12-20: 2.5 mg via ORAL
  Filled 2023-12-20: qty 1

## 2023-12-20 MED ORDER — AMIODARONE HCL 200 MG PO TABS: 200.0000 mg | ORAL_TABLET | Freq: Every day | ORAL | Status: DC

## 2023-12-20 MED ORDER — SPIRONOLACTONE 25 MG PO TABS
25.0000 mg | ORAL_TABLET | Freq: Every day | ORAL | Status: DC
Start: 2023-12-20 — End: 2023-12-28
  Administered 2023-12-20 – 2023-12-28 (×9): 25 mg via ORAL
  Filled 2023-12-20 (×9): qty 1

## 2023-12-20 MED ORDER — POTASSIUM CHLORIDE CRYS ER 20 MEQ PO TBCR
20.0000 meq | EXTENDED_RELEASE_TABLET | Freq: Once | ORAL | Status: AC
  Administered 2023-12-20: 20 meq via ORAL
  Filled 2023-12-20: qty 1

## 2023-12-20 NOTE — Progress Notes (Signed)
 This chaplain is present with the Pt. in the setting of creating the Pt. Advance Directive:  HCPOA and Living Will. Family is not at the bedside.  The Pt. is sitting on the edge of the bed at the time of the visit and participated in AD education with the chaplain. The Pt. is able to answer clarifying questions during AD education.  The Pt. is naming [name] Muzzy as healthcare agent. If this person is unwilling or unable to serve in this role, the Pt. next choice is [name] Muzzy.  **1025 The chaplain is present with the Pt., notary, and witnesses for the notarizing of the Pt. Advance Directive. The chaplain gave the Pt. the original AD along with two copies. The chaplain scanned the Pt. AD into the Pt. EMR.  This chaplain is available for F/U spiritual care as needed.  Chaplain Leeroy Hummer [phone]

## 2023-12-20 NOTE — Plan of Care (Signed)
  Problem: Education: Goal: Knowledge of General Education information will improve Description: Including pain rating scale, medication(s)/side effects and non-pharmacologic comfort measures Outcome: Progressing   Problem: Clinical Measurements: Goal: Ability to maintain clinical measurements within normal limits will improve Outcome: Progressing Goal: Will remain free from infection Outcome: Progressing Goal: Diagnostic test results will improve Outcome: Progressing Goal: Respiratory complications will improve Outcome: Progressing Goal: Cardiovascular complication will be avoided Outcome: Progressing   Problem: Activity: Goal: Risk for activity intolerance will decrease Outcome: Progressing   Problem: Nutrition: Goal: Adequate nutrition will be maintained Outcome: Progressing   Problem: Elimination: Goal: Will not experience complications related to urinary retention Outcome: Progressing   Problem: Pain Managment: Goal: General experience of comfort will improve and/or be controlled Outcome: Progressing   Problem: Safety: Goal: Ability to remain free from injury will improve Outcome: Progressing   Problem: Skin Integrity: Goal: Risk for impaired skin integrity will decrease Outcome: Progressing   Problem: Activity: Goal: Ability to return to baseline activity level will improve Outcome: Progressing   Problem: Cardiovascular: Goal: Vascular access site(s) Level 0-1 will be maintained Outcome: Progressing   Problem: Health Behavior/Discharge Planning: Goal: Ability to safely manage health-related needs after discharge will improve Outcome: Progressing

## 2023-12-20 NOTE — Plan of Care (Signed)

## 2023-12-20 NOTE — TOC Progression Note (Signed)
 Transition of Care [facility]) - Progression Note    Patient Details  Name: Douglas [name] MRN: [id number] Date of Birth: 07-10-1955  [facility]) CM/SW Contact  [name] Phone Number: [phone] 12/20/2023, 12:12 PM  Clinical Narrative:  HF CSW and NCM met with patient at bedside.  CSW informed patient of additional bed offer. Patient does not seem pleased with his SNF options. Patient inquired about [facility]. CSW faxed the patient out. Offer pending.   From progressions patient should be ready sometime next week.   HF CSW/CM will continue to follow and monitor for dc readiness.     Expected Discharge Plan: Skilled Nursing Facility Barriers to Discharge: Continued Medical Work up               Expected Discharge Plan and Services   Discharge Planning Services: CM Consult Post Acute Care Choice: Skilled Nursing Facility Living arrangements for the past 2 months: Single Family Home                                       Social Drivers of Health (SDOH) Interventions SDOH Screenings   Food Insecurity: No Food Insecurity (12/13/2023)  Housing: Low Risk  (12/13/2023)  Transportation Needs: No Transportation Needs (12/13/2023)  Utilities: Not At Risk (12/13/2023)  Depression (PHQ2-9): Medium Risk (11/14/2023)  Social Connections: Moderately Isolated (12/13/2023)  Tobacco Use: Low Risk  (12/13/2023)    Readmission Risk Interventions    05/31/2023   11:35 AM 05/30/2023   10:20 AM  Readmission Risk Prevention Plan  Transportation Screening Complete Complete  Home Care Screening Complete Complete  Medication Review (RN CM) Complete Complete

## 2023-12-20 NOTE — Progress Notes (Signed)
 Physical Therapy Treatment Patient Details Name: Douglas [name] MRN: [id number] DOB: 1955-08-23 Today's Date: 12/20/2023   History of Present Illness 68 y.o. M admitted 12/12/23 with abdominal pain, N/V. Had VTach and Afib in ED. Pt with decompensated CHF. 10/19 syncope with Vtach causing fall. 10/22 L/RHC. PMHx: obesity, T2DM, HTN, HLD, Afib on Eliquis , CAD s/p CABG, ICM, CHF, AICD, anemia, asthma, opiate dependence.    PT Comments  Pt received in supine, pleasantly agreeable to therapy session and with good participation and tolerance for transfer, gait and pre-stair negotiation standing exercises. Pt needing up to CGA for standing balance/exercise tasks with BUE support and CGA to Supervision for gait, less assist when using Rollator for safety/energy conservation. Pt may benefit from rollator for energy conservation upon DC, pending progress, plan to have pt perform stair negotiation next session as he did well with anterior and lateral foot taps with each leg this date. Pt continues to benefit from PT services to progress toward functional mobility goals.     If plan is discharge home, recommend the following: Assistance with cooking/housework;Assist for transportation   Can travel by private vehicle        Equipment Recommendations  None recommended by PT (consider bariatric rollator for energy conservation, pending progress)    Recommendations for Other Services       Precautions / Restrictions Precautions Precautions: Fall;Other (comment) Recall of Precautions/Restrictions: Intact Precaution/Restrictions Comments: 10/22 Lt radial heart cath Restrictions Weight Bearing Restrictions Per Provider Order: No     Mobility  Bed Mobility Overal bed mobility: Modified Independent Bed Mobility: Supine to Sit     Supine to sit: Modified independent (Device/Increase time)     General bed mobility comments: pt able to transition supine<>sit without assist with bed flat, no rails,  increased time to perform    Transfers Overall transfer level: Modified independent   Transfers: Sit to/from Stand Sit to Stand: Modified independent (Device/Increase time), Contact guard assist           General transfer comment: pt able to stand from bed with limited UB use modI and to toilet seat with CGA for safety when not using rail    Ambulation/Gait Ambulation/Gait assistance: Supervision Gait Distance (Feet): 300 Feet Assistive device: None, Rollator (4 wheels) Gait Pattern/deviations: Step-through pattern, Decreased stride length, Wide base of support       General Gait Details: pt with increased lateral sway of gait which he reports is baseline due to back injuries. pt able to walk on RA with SPO2 WFL and HR to ~82 bpm with exertion. ~78ft no AD, then ~233ft with rollator for pt energy conservation/safety, but pt did not request seated break. Very light UE placement on rollator.   Stairs             Wheelchair Mobility     Tilt Bed    Modified Rankin (Stroke Patients Only)       Balance Overall balance assessment: Mild deficits observed, not formally tested Sitting-balance support: No upper extremity supported, Feet supported Sitting balance-Leahy Scale: Good     Standing balance support: No upper extremity supported, During functional activity Standing balance-Leahy Scale: Fair Standing balance comment: Good with rollator, fair no AD                            Communication Communication Communication: Impaired Factors Affecting Communication: Hearing impaired  Cognition Arousal: Alert Behavior During Therapy: WFL for tasks assessed/performed  PT - Cognitive impairments: No apparent impairments                         Following commands: Intact      Cueing Cueing Techniques: Verbal cues  Exercises Other Exercises Other Exercises: standing at RW bil LE foot taps on 7 platform x10 reps anterior and x10 reps  laterally each leg    General Comments General comments (skin integrity, edema, etc.): VSS on RA      Pertinent Vitals/Pain Pain Assessment Pain Assessment: Faces Faces Pain Scale: Hurts a little bit Pain Location: back, RLE intermittently Pain Descriptors / Indicators: Guarding Pain Intervention(s): Monitored during session, Repositioned    Home Living                          Prior Function            PT Goals (current goals can now be found in the care plan section) Acute Rehab PT Goals Patient Stated Goal: to go home PT Goal Formulation: With patient Time For Goal Achievement: 12/27/23 Progress towards PT goals: Progressing toward goals    Frequency    Min 2X/week      PT Plan      Co-evaluation              AM-PAC PT 6 Clicks Mobility   Outcome Measure  Help needed turning from your back to your side while in a flat bed without using bedrails?: None Help needed moving from lying on your back to sitting on the side of a flat bed without using bedrails?: None Help needed moving to and from a bed to a chair (including a wheelchair)?: A Little Help needed standing up from a chair using your arms (e.g., wheelchair or bedside chair)?: A Little Help needed to walk in hospital room?: A Little Help needed climbing 3-5 steps with a railing? : A Little 6 Click Score: 20    End of Session Equipment Utilized During Treatment: Gait belt Activity Tolerance: Patient tolerated treatment well Patient left: with call bell/phone within reach;Other (comment) (up on toilet, pt agreeable to pull wall bell before getting back up, RN/NT notified) Nurse Communication: Mobility status;Other (comment) (pt on toilet) PT Visit Diagnosis: Muscle weakness (generalized) (M62.81)     Time: 8640-8576 PT Time Calculation (min) (ACUTE ONLY): 24 min  Charges:    $Gait Training: 8-22 mins $Therapeutic Exercise: 8-22 mins PT General Charges $$ ACUTE PT VISIT: 1  Visit                     Kalifa Cadden P., PTA Acute Rehabilitation Services Secure Chat Preferred 9a-5:30pm Office: [phone]    [name] [facility] 12/20/2023, 3:09 PM

## 2023-12-20 NOTE — TOC Progression Note (Signed)
 Transition of Care [facility]) - Progression Note    Patient Details  Name: Douglas [name] MRN: [id number] Date of Birth: [birth year]  [facility] CM/SW Contact  Justina Delcia Czar, RN Phone Number: [phone] 12/20/2023, 5:44 PM  Clinical Narrative:    HF CM/CSW spoke to pt at bedside. States he would SNF rehab to get stronger. Waiting PT/OT evaluation and recommendations.  Pt had HH in the past with [facility]. Sent referral in portal for review for agency.   Pt reports having RW and cane at home.    Expected Discharge Plan: Skilled Nursing Facility Barriers to Discharge: Continued Medical Work up     Expected Discharge Plan and Services   Discharge Planning Services: CM Consult Post Acute Care Choice: Home Health Living arrangements for the past 2 months: Mobile Home                           HH Arranged: PT, RN           Social Drivers of Health (SDOH) Interventions SDOH Screenings   Food Insecurity: No Food Insecurity (12/13/2023)  Housing: Low Risk  (12/13/2023)  Transportation Needs: No Transportation Needs (12/13/2023)  Utilities: Not At Risk (12/13/2023)  Depression (PHQ2-9): Medium Risk (11/14/2023)  Social Connections: Moderately Isolated (12/13/2023)  Tobacco Use: Low Risk  (12/13/2023)    Readmission Risk Interventions    05/31/2023   11:35 AM 05/30/2023   10:20 AM  Readmission Risk Prevention Plan  Transportation Screening Complete Complete  Home Care Screening Complete Complete  Medication Review (RN CM) Complete Complete

## 2023-12-20 NOTE — Progress Notes (Signed)
 PROGRESS NOTE    Douglas [name]  FMW:[id number] DOB: [birth year] DOA: 12/12/2023 PCP: Mancil Pfeiffer, PA-C    Brief Narrative:   68 year old with history of DM2, HTN, A-fib on Eliquis , CAD, ischemic cardiomyopathy, CHF status post AICD EF 35%, asthma, anemia of chronic disease, opioid dependence comes to the ED for acute respiratory failure.  Patient was found to be in CHF exacerbation and had concerns for recurrent ventricular tachycardia with syncope.  Patient was transferred from [facility] to [facility] for evaluation by EP, CHF and palliative care team.  Cardiology is aggressively managing diuretics as appropriate.  Assessment & Plan:  Acute exacerbation of chronic systolic CHF Hypertension -Echocardiogram shows reduced EF of 35%, cardiology team is following and adjusting diuretics as necessary.  Not a good candidate for advanced therapies.  GDMT per cardiology  A-fib with RVR  Permanent A-fib Recurrent ventricular tachycardia Unfortunate recurrent ventricular tachycardia complicated by atrial fibrillation.  Also had an episode of syncope.  EP team has been consulted.  Currently on amiodarone for 100 mg twice daily for 14 days thereafter 200 mg once daily, mexiletine 150 mg twice daily and Toprol -XL.  Avoid to further increase Toprol  - Eliquis    10/22, underwent RHC/LHC found to have severe native three-vessel CAD with severe ischemic cardiomyopathy, continued patency of all 4 bypass grafts, severe IV failure with severely decreased cardiac output with elevated filling pressures -suggestive of end-stage CHF  Goals of care discussion - Palliative care team following     Elevated troponin H/o CAD s/p PCI/CABG HLD Left heart catheterization results as mentioned above   Acute respiratory failure with hypoxia, resolved Initially required 3 L now on room air   AKI Baseline creatinine 1.0, peaked at 2.02 now improving with  diuretics  HypOkalemia/hypocalcemia Replete as needed  Abdominal pain Primarily presented with lower abdominal pain, difficulty with voiding.   CT abdomen and pelvis without contrast negative for acute abnormality.   Urinary bladder, ureters kidneys without signs of obstruction. LFTs and lipase were normal   Type 2 diabetes mellitus A1c 5.9 on April 2025 PTA meds-Jardiance  10 mg daily Continue SSI/Accu-Cheks  Chronic low back pain Chronic opiate dependence PT/OT-home health Pain medications   Constipation Start on scheduled and as needed bowel regimen   Anxiety/depression PTA meds-Celexa  20 mg daily, Xanax  1 mg twice daily as needed Currently on same   Obesity 2 Body mass index is 37.77 kg/m. Patient has been advised to make an attempt to improve diet and exercise patterns to aid in weight loss.     DVT prophylaxis: apixaban  (ELIQUIS ) tablet 5 mg     Code Status: Full Code Family Communication:   Status is: Inpatient Still having signs of volume overload requiring IV Lasix .  Discharge once cleared by CHF team.   PT Follow up Recs: Home Health Pt10/20/2025 1600, face-to-face completed  Subjective: Still some exertional dyspnea   Examination:  General exam: Appears calm and comfortable  Respiratory system: Bibasilar crackles Cardiovascular system: S1 & S2 heard, RRR. No JVD, murmurs, rubs, gallops or clicks.  Mild bilateral trace edema Gastrointestinal system: Abdomen is nondistended, soft and nontender. No organomegaly or masses felt. Normal bowel sounds heard. Central nervous system: Alert and oriented. No focal neurological deficits. Extremities: Symmetric 5 x 5 power. Skin: No rashes, lesions or ulcers Psychiatry: Judgement and insight appear normal. Mood & affect appropriate.                Diet Orders (From admission, onward)  Start     Ordered   12/18/23 1052  Diet Heart Room service appropriate? Yes; Fluid consistency: Thin  Diet  effective now       Question Answer Comment  Room service appropriate? Yes   Fluid consistency: Thin      12/18/23 1051            Objective: Vitals:   12/20/23 0446 12/20/23 0730 12/20/23 0815 12/20/23 1111  BP:  108/68  127/83  Pulse:  63  61  Resp:  18  20  Temp:  98 F (36.7 C)  97.9 F (36.6 C)  TempSrc:  Oral  Oral  SpO2:  95% 94% 94%  Weight: 112.9 kg     Height:        Intake/Output Summary (Last 24 hours) at 12/20/2023 1112 Last data filed at 12/20/2023 0837 Gross per 24 hour  Intake 1172 ml  Output --  Net 1172 ml   Filed Weights   12/19/23 0310 12/19/23 0506 12/20/23 0446  Weight: 118.4 kg 113.8 kg 112.9 kg    Scheduled Meds:  [START ON 01/01/2024] amiodarone  200 mg Oral Daily   amiodarone  400 mg Oral BID   apixaban   5 mg Oral BID   Chlorhexidine  Gluconate Cloth  6 each Topical Daily   citalopram   20 mg Oral Daily   digoxin  0.0625 mg Oral Daily   empagliflozin   10 mg Oral Daily   ezetimibe   10 mg Oral Daily   fluticasone   1 spray Each Nare Daily   fluticasone  furoate-vilanterol  1 puff Inhalation Daily   furosemide   80 mg Intravenous BID   metoprolol  succinate  25 mg Oral Daily   mexiletine  150 mg Oral Q12H   pantoprazole   20 mg Oral Daily   rosuvastatin   40 mg Oral Daily   senna-docusate  1 tablet Oral QHS   spironolactone   25 mg Oral Daily   tamsulosin   0.4 mg Oral Daily   traZODone   50 mg Oral QHS   Continuous Infusions:  Nutritional status     Body mass index is 36.77 kg/m.  Data Reviewed:   CBC: Recent Labs  Lab 12/16/23 0215 12/17/23 0256 12/18/23 0251 12/18/23 0931 12/18/23 0939 12/19/23 0241 12/20/23 0028  WBC 8.2 9.1 6.9  --   --  7.5 6.5  HGB 12.5* 12.7* 12.7* 15.3  15.0 10.9* 12.1* 11.9*  HCT 42.8 43.1 42.4 45.0  44.0 32.0* 40.3 39.8  MCV 83.4 83.0 82.3  --   --  81.7 81.9  PLT 258 282 243  --   --  233 239   Basic Metabolic Panel: Recent Labs  Lab 12/16/23 0215 12/17/23 0256 12/18/23 0251  12/18/23 0931 12/18/23 0939 12/19/23 0241 12/20/23 0028 12/20/23 0430  NA 133* 131* 131* 135  135 148* 133*  --  135  K 4.8 4.8 5.1 4.2  4.1 2.7* 4.4  --  4.1  CL 93* 92* 91*  --   --  94*  --  93*  CO2 30 31 29   --   --  29  --  31  GLUCOSE 123* 109* 117*  --   --  106*  --  94  BUN 32* 36* 46*  --   --  43*  --  35*  CREATININE 1.40* 1.56* 1.63*  --   --  1.40*  --  1.33*  CALCIUM  8.7* 9.0 8.9  --   --  8.8*  --  9.1  MG 1.7  2.6* 2.4  --   --  2.3 2.0 2.0  PHOS  --   --   --   --   --   --   --  4.2   GFR: Estimated Creatinine Clearance: 66.8 mL/min (A) (by C-G formula based on SCr of 1.33 mg/dL (H)). Liver Function Tests: Recent Labs  Lab 12/20/23 0430  AST 29  ALT 20  ALKPHOS 73  BILITOT 1.4*  PROT 7.1  ALBUMIN 3.1*   No results for input(s): LIPASE, AMYLASE in the last 168 hours. No results for input(s): AMMONIA in the last 168 hours. Coagulation Profile: No results for input(s): INR, PROTIME in the last 168 hours. Cardiac Enzymes: No results for input(s): CKTOTAL, CKMB, CKMBINDEX, TROPONINI in the last 168 hours. BNP (last 3 results) Recent Labs    12/12/23 1234  PROBNP 5,740.0*   HbA1C: No results for input(s): HGBA1C in the last 72 hours. CBG: Recent Labs  Lab 12/13/23 1140  GLUCAP 128*   Lipid Profile: No results for input(s): CHOL, HDL, LDLCALC, TRIG, CHOLHDL, LDLDIRECT in the last 72 hours. Thyroid Function Tests: No results for input(s): TSH, T4TOTAL, FREET4, T3FREE, THYROIDAB in the last 72 hours. Anemia Panel: No results for input(s): VITAMINB12, FOLATE, FERRITIN, TIBC, IRON, RETICCTPCT in the last 72 hours. Sepsis Labs: Recent Labs  Lab 12/13/23 1503 12/13/23 1756  LATICACIDVEN 1.4 1.5    Recent Results (from the past 240 hours)  Resp panel by RT-PCR (RSV, Flu A&B, Covid) Anterior Nasal Swab     Status: None   Collection Time: 12/12/23 12:04 PM   Specimen: Anterior Nasal Swab   Result Value Ref Range Status   SARS Coronavirus 2 by RT PCR NEGATIVE NEGATIVE Final    Comment: (NOTE) SARS-CoV-2 target nucleic acids are NOT DETECTED.  The SARS-CoV-2 RNA is generally detectable in upper respiratory specimens during the acute phase of infection. The lowest concentration of SARS-CoV-2 viral copies this assay can detect is 138 copies/mL. A negative result does not preclude SARS-Cov-2 infection and should not be used as the sole basis for treatment or other patient management decisions. A negative result may occur with  improper specimen collection/handling, submission of specimen other than nasopharyngeal swab, presence of viral mutation(s) within the areas targeted by this assay, and inadequate number of viral copies(<138 copies/mL). A negative result must be combined with clinical observations, patient history, and epidemiological information. The expected result is Negative.  Fact Sheet for Patients:  BloggerCourse.com  Fact Sheet for Healthcare Providers:  SeriousBroker.it  This test is no t yet approved or cleared by the [place]  FDA and  has been authorized for detection and/or diagnosis of SARS-CoV-2 by FDA under an Emergency Use Authorization (EUA). This EUA will remain  in effect (meaning this test can be used) for the duration of the COVID-19 declaration under Section 564(b)(1) of the Act, 21 U.S.C.section 360bbb-3(b)(1), unless the authorization is terminated  or revoked sooner.       Influenza A by PCR NEGATIVE NEGATIVE Final   Influenza B by PCR NEGATIVE NEGATIVE Final    Comment: (NOTE) The Xpert Xpress SARS-CoV-2/FLU/RSV plus assay is intended as an aid in the diagnosis of influenza from Nasopharyngeal swab specimens and should not be used as a sole basis for treatment. Nasal washings and aspirates are unacceptable for Xpert Xpress SARS-CoV-2/FLU/RSV testing.  Fact Sheet for  Patients: BloggerCourse.com  Fact Sheet for Healthcare Providers: SeriousBroker.it  This test is not yet approved or cleared by the [place]  FDA  and has been authorized for detection and/or diagnosis of SARS-CoV-2 by FDA under an Emergency Use Authorization (EUA). This EUA will remain in effect (meaning this test can be used) for the duration of the COVID-19 declaration under Section 564(b)(1) of the Act, 21 U.S.C. section 360bbb-3(b)(1), unless the authorization is terminated or revoked.     Resp Syncytial Virus by PCR NEGATIVE NEGATIVE Final    Comment: (NOTE) Fact Sheet for Patients: BloggerCourse.com  Fact Sheet for Healthcare Providers: SeriousBroker.it  This test is not yet approved or cleared by the [place]  FDA and has been authorized for detection and/or diagnosis of SARS-CoV-2 by FDA under an Emergency Use Authorization (EUA). This EUA will remain in effect (meaning this test can be used) for the duration of the COVID-19 declaration under Section 564(b)(1) of the Act, 21 U.S.C. section 360bbb-3(b)(1), unless the authorization is terminated or revoked.  Performed at [facility], [address] [place], [place] [address]   MRSA Next Gen by PCR, Nasal     Status: None   Collection Time: 12/12/23  9:50 PM   Specimen: Nasal Mucosa; Nasal Swab  Result Value Ref Range Status   MRSA by PCR Next Gen NOT DETECTED NOT DETECTED Final    Comment: (NOTE) The GeneXpert MRSA Assay (FDA approved for NASAL specimens only), is one component of a comprehensive MRSA colonization surveillance program. It is not intended to diagnose MRSA infection nor to guide or monitor treatment for MRSA infections. Test performance is not FDA approved in patients less than 69 years old. Performed at [facility], [address] [place], [place] [address]          Radiology  Studies: No results found.         LOS: 8 days   Time spent= 35 mins    Burgess JAYSON Dare, MD [facility]  If 7PM-7AM, please contact night-coverage  12/20/2023, 11:12 AM

## 2023-12-20 NOTE — Progress Notes (Addendum)
 Patient ID: Douglas [name], male   DOB: 23-Feb-1956, 68 y.o.   MRN: [id number]     Advanced Heart Failure Rounding Note  Cardiologist: Danelle Birmingham, MD   Chief Complaint: CHF  Subjective:    10/19 patient was ambulating to the bathroom with his walker. RN witnessed him fall face first and lose consciousness. CT head okay. On tele review: VT treated with ATP  10/22 R/LHC: patent bypass grafts, severe native disease, severe BiV failure with severely reduced CO w/ elevated filling pressures.  CO-OX 74%.  CVP 12. Continues on IV lasix  80 BID.   Objective:   Weight Range: 112.9 kg Body mass index is 36.77 kg/m.   Vital Signs:   Temp:  [97.8 F (36.6 C)-98.7 F (37.1 C)] 98 F (36.7 C) (10/24 0730) Pulse Rate:  [56-72] 63 (10/24 0730) Resp:  [18-20] 18 (10/24 0730) BP: (97-137)/(65-97) 108/68 (10/24 0730) SpO2:  [92 %-95 %] 95 % (10/24 0730) Weight:  [112.9 kg] 112.9 kg (10/24 0446) Last BM Date : 12/19/23  Weight change: Filed Weights   12/19/23 0310 12/19/23 0506 12/20/23 0446  Weight: 118.4 kg 113.8 kg 112.9 kg    Intake/Output:   Intake/Output Summary (Last 24 hours) at 12/20/2023 0754 Last data filed at 12/20/2023 0500 Gross per 24 hour  Intake 1174 ml  Output --  Net 1174 ml      Physical Exam    General:  Chronically ill appearing Cor: Irregular rhythm. No murmur. Lungs: clear Abdomen: obese, nondistended Extremities: trace edema Neuro: alert & orientedx3. Affect pleasant   Telemetry   AF 50s, occasionally PVCs  Labs    CBC Recent Labs    12/19/23 0241 12/20/23 0028  WBC 7.5 6.5  HGB 12.1* 11.9*  HCT 40.3 39.8  MCV 81.7 81.9  PLT 233 239   Basic Metabolic Panel Recent Labs    89/76/74 0241 12/20/23 0028 12/20/23 0430  NA 133*  --  135  K 4.4  --  4.1  CL 94*  --  93*  CO2 29  --  31  GLUCOSE 106*  --  94  BUN 43*  --  35*  CREATININE 1.40*  --  1.33*  CALCIUM  8.8*  --  9.1  MG 2.3 2.0 2.0  PHOS  --   --  4.2   Liver  Function Tests Recent Labs    12/20/23 0430  AST 29  ALT 20  ALKPHOS 73  BILITOT 1.4*  PROT 7.1  ALBUMIN 3.1*    No results for input(s): LIPASE, AMYLASE in the last 72 hours.  Cardiac Enzymes No results for input(s): CKTOTAL, CKMB, CKMBINDEX, TROPONINI in the last 72 hours.  BNP: BNP (last 3 results) Recent Labs    04/23/23 1840 05/28/23 0322 05/29/23 0413  BNP 253.0* 557.0* 672.0*    ProBNP (last 3 results) Recent Labs    12/12/23 1234  PROBNP 5,740.0*     D-Dimer No results for input(s): DDIMER in the last 72 hours. Hemoglobin A1C No results for input(s): HGBA1C in the last 72 hours. Fasting Lipid Panel No results for input(s): CHOL, HDL, LDLCALC, TRIG, CHOLHDL, LDLDIRECT in the last 72 hours. Thyroid Function Tests No results for input(s): TSH, T4TOTAL, T3FREE, THYROIDAB in the last 72 hours.  Invalid input(s): FREET3  Other results:  Imaging  No results found.   Medications:   Scheduled Medications:  amiodarone  400 mg Oral BID   apixaban   5 mg Oral BID   Chlorhexidine  Gluconate Cloth  6  each Topical Daily   citalopram   20 mg Oral Daily   digoxin  0.0625 mg Oral Daily   empagliflozin   10 mg Oral Daily   ezetimibe   10 mg Oral Daily   fluticasone   1 spray Each Nare Daily   fluticasone  furoate-vilanterol  1 puff Inhalation Daily   furosemide   80 mg Intravenous BID   metoprolol  succinate  25 mg Oral Daily   mexiletine  150 mg Oral Q12H   pantoprazole   20 mg Oral Daily   rosuvastatin   40 mg Oral Daily   senna-docusate  1 tablet Oral QHS   spironolactone   12.5 mg Oral Daily   tamsulosin   0.4 mg Oral Daily   traZODone   50 mg Oral QHS    Infusions:     PRN Medications: acetaminophen , albuterol , ALPRAZolam , alum & mag hydroxide-simeth, artificial tears, glucagon (human recombinant), guaiFENesin, ondansetron  **OR** ondansetron  (ZOFRAN ) IV, mouth rinse, oxyCODONE -acetaminophen  **AND** oxyCODONE ,  polyethylene glycol, sodium chloride , tiZANidine  Assessment/Plan   1. Acute on chronic systolic CHF: Ischemic cardiomyopathy.  [employer] ICD.  He has been on minimal GDMT at home, apparently limited by low BP.  Patient has a long-standing cardiomyopathy, echoes with EF in the 20-25% range since at least 2015.  By Dr. Rolan review of echo this admission, EF 25% with moderate RV dysfunction and dilated IVC. R/LHC 10/22: Patent bypass grafts, severe BiV failure with elevated filling pressures, Fick CI 1.6, PAPi 2.1 - CO-OX better today - CVP 12. Continue IV lasix  80 BID, give 2.5 metolazone - Increase spiro to 25 mg daily - Decreased metoprolol  xl to 25 mg daily (would not increase any higher with low-output HF) - Continue jardiance  10 mg daily - I am very worried about his trajectory and ability to return home alone. He has end-stage HF.  Palliative Care now on board. Appreciate assistance.   2. VT: Patient has permanent AF but was noted to have a run of VT this admission treated with ATP (dual tachycardia).  Seen by EP. He was stated on amiodarone, now po.  Trigger for VT may have been drastic drop recently in Toprol  XL dose 200 mg daily => 12.5 daily.   - Another run of VT 10/19 w/ syncopal event, looks like treated with ATP (initiated with PMVT) - Previously on 200 mg metoprolol  xl daily, decreased to 25 mg daily this admit (would not increase any further with low-output HF - No recurrent VT. Continue amiodarone 400 mg BID per EP, and mexiletine 150 BID.  3. Atrial fibrillation: Permanent.  He is currently rate-controlled.  - Continue apixaban  - Toprol  XL as above  4. AKI: Creatinine jumped 1.09 => 1.86 => 2.02 with elevated K.  He received Lokelma.  Foley placed, 500 cc urine out and abdominal pain resolved.   - Scr now 1.3 - Now on Flomax  for suspected BPH.  - Follow closely with cath and diuresis  5. CAD: H/o CABG 2002.  Last cath in 2013 with occluded LAD and RCA, 2 occluded distal  OMs.  No chest pain.  Troponin minimally elevated with no trend, suspect demand ischemia.  - Grafts are LIMA - LAD, SVG -Diag, SVG - OM2 -OM3. Patent on cath 12/18/23. - No aspirin  with need for anticoagulation - Continue Zetia  and Crestor    Length of Stay: 8  FINCH, LINDSAY N, PA-C  12/20/2023, 7:54 AM  Advanced Heart Failure Team Pager [phone] (M-F; 7a - 5p)  Please contact [facility] for night-coverage after hours (5p -7a ) and weekends on  [email or web address]  Patient seen with PA, I formulated the plan and agree with the above note.   CVP 12 today, creatinine 1.4 => 1.33.  Weight down 1 lb.  Not able to get I/Os.  Co-ox better today at 74%.    No VT.   Breathing improving.   General: NAD Neck: JVP 12 cm, no thyromegaly or thyroid nodule.  Lungs: Clear to auscultation bilaterally with normal respiratory effort. CV: Nondisplaced PMI.  Heart irregular S1/S2, no S3/S4, no murmur.  No peripheral edema.   Abdomen: Soft, nontender, no hepatosplenomegaly, no distention.  Skin: Intact without lesions or rashes.  Neurologic: Alert and oriented x 3.  Psych: Normal affect. Extremities: No clubbing or cyanosis.  HEENT: Normal.   Patient remains volume overloaded.  CVP 12-13 on my read, creatinine lower at 1.33.  Will give Lasix  80 mg IV bid today + metolazone 2.5 x 1 today.     Low cardiac output by RHC.  He is not a good candidate for advanced therapies, he has been minimally ambulatory at home.  Co-ox 74% today.  I will try to avoid milrinone with VT.   Continue digoxin and Jardiance , will increase spironolactone  to 25 mg daily.  If BP and creatinine remain stable tomorrow, start losartan low dose.    No further VT.  Would not increase Toprol  XL with low output HF.  Continue amiodarone and mexiletine.    Chronic AF, on apixaban .   Mobilize, he will need SNF.   Ezra Shuck 12/20/2023 9:01 AM

## 2023-12-21 DIAGNOSIS — I472 Ventricular tachycardia, unspecified: Secondary | ICD-10-CM | POA: Diagnosis not present

## 2023-12-21 LAB — COMPREHENSIVE METABOLIC PANEL WITH GFR
ALT: 26 U/L (ref 0–44)
AST: 38 U/L (ref 15–41)
Albumin: 3.2 g/dL — ABNORMAL LOW (ref 3.5–5.0)
Alkaline Phosphatase: 74 U/L (ref 38–126)
Anion gap: 11 (ref 5–15)
BUN: 34 mg/dL — ABNORMAL HIGH (ref 8–23)
CO2: 31 mmol/L (ref 22–32)
Calcium: 8.6 mg/dL — ABNORMAL LOW (ref 8.9–10.3)
Chloride: 88 mmol/L — ABNORMAL LOW (ref 98–111)
Creatinine, Ser: 1.49 mg/dL — ABNORMAL HIGH (ref 0.61–1.24)
GFR, Estimated: 51 mL/min — ABNORMAL LOW (ref >60)
Glucose, Bld: 97 mg/dL (ref 70–99)
Potassium: 3.6 mmol/L (ref 3.5–5.1)
Sodium: 130 mmol/L — ABNORMAL LOW (ref 135–145)
Total Bilirubin: 1.3 mg/dL — ABNORMAL HIGH (ref 0.0–1.2)
Total Protein: 7.2 g/dL (ref 6.5–8.1)

## 2023-12-21 LAB — COOXEMETRY PANEL
Carboxyhemoglobin: 1.9 % — ABNORMAL HIGH (ref 0.5–1.5)
Methemoglobin: 1.6 % — ABNORMAL HIGH (ref 0.0–1.5)
O2 Saturation: 58.1 %
Total hemoglobin: 13.8 g/dL (ref 12.0–16.0)

## 2023-12-21 LAB — MAGNESIUM: Magnesium: 2 mg/dL (ref 1.7–2.4)

## 2023-12-21 MED ORDER — POLYETHYLENE GLYCOL 3350 17 G PO PACK: 17.0000 g | PACK | Freq: Two times a day (BID) | ORAL | Status: DC

## 2023-12-21 MED ORDER — SENNOSIDES-DOCUSATE SODIUM 8.6-50 MG PO TABS
1.0000 | ORAL_TABLET | Freq: Two times a day (BID) | ORAL | Status: DC
  Administered 2023-12-21 – 2023-12-22 (×2): 1 via ORAL
  Filled 2023-12-21 (×2): qty 1

## 2023-12-21 MED ORDER — POLYETHYLENE GLYCOL 3350 17 G PO PACK
17.0000 g | PACK | Freq: Every day | ORAL | Status: DC
  Administered 2023-12-21 – 2023-12-22 (×2): 17 g via ORAL
  Filled 2023-12-21 (×2): qty 1

## 2023-12-21 NOTE — Progress Notes (Signed)
 Physical Therapy Treatment Patient Details Name: Douglas [name] MRN: [id number] DOB: 01/24/1956 Today's Date: 12/21/2023   History of Present Illness 68 y.o. M admitted 12/12/23 with abdominal pain, N/V. Had VTach and Afib in ED. Pt with decompensated CHF. 10/19 syncope with Vtach causing fall. 10/22 L/RHC. PMHx: obesity, T2DM, HTN, HLD, Afib on Eliquis , CAD s/p CABG, ICM, CHF, AICD, anemia, asthma, opiate dependence.    PT Comments  Pt received in chair, agreeable to therapy session with emphasis on stair negotiation in room to simulate home entry stairs and for standing BLE strengthening. Pt needing CGA at most for safety/stability with stair ascent/descent and cues to avoid excessive LUE pushing/pulling with transfers. Pt c/o fatigue after step-ups x15 reps and foot taps on step x10 reps, requesting to sit and rest in chair. Pt continues to benefit from PT services to progress toward functional mobility goals, continue to recommend HHPT, pt may benefit from [facility] aide as his activity tolerance is not yet back to baseline per his report, may need social work to provide info on how this works.     If plan is discharge home, recommend the following: Assistance with cooking/housework;Assist for transportation   Can travel by private vehicle        Equipment Recommendations  None recommended by PT;Other (comment) (consider bariatric rollator for energy conservation, pending progress)    Recommendations for Other Services       Precautions / Restrictions Precautions Precautions: Fall;Other (comment) Recall of Precautions/Restrictions: Intact Precaution/Restrictions Comments: 10/22 Lt radial heart cath, reviewed keeping LUE on lap when transferring to avoid heavy pushing/pulling with transfers/bed mobility Restrictions Weight Bearing Restrictions Per Provider Order: No     Mobility  Bed Mobility Overal bed mobility: Modified Independent             General bed mobility comments: pt  received in chair    Transfers Overall transfer level: Needs assistance Equipment used: Rolling walker (2 wheels) Transfers: Sit to/from Stand Sit to Stand: Supervision           General transfer comment: from chair using RUE to push up after initial cues to avoid excessive LUE pushing/pulling    Ambulation/Gait Ambulation/Gait assistance: Supervision Gait Distance (Feet): 15 Feet Assistive device: Rolling walker (2 wheels) Gait Pattern/deviations: Step-through pattern, Decreased stride length, Wide base of support       General Gait Details: distance limited due to fatigue after stair training   Stairs Stairs: Yes Stairs assistance: Contact guard assist, Supervision Stair Management: Two rails, Step to pattern, Backwards, Forwards Number of Stairs: 15 General stair comments: mostly ascending with LLE and descending with RLE, but able to ascend with RLE a couple times when attempted without buckling. BUE support to simulate home entry. c/o fatigue after 13 reps, but able to perform a couple more with encouragement   Wheelchair Mobility     Tilt Bed    Modified Rankin (Stroke Patients Only)       Balance Overall balance assessment: Mild deficits observed, not formally tested Sitting-balance support: No upper extremity supported, Feet supported Sitting balance-Leahy Scale: Good     Standing balance support: No upper extremity supported, During functional activity Standing balance-Leahy Scale: Fair Standing balance comment: Good with RW, fair no AD                            Communication Communication Communication: Impaired Factors Affecting Communication: Hearing impaired  Cognition Arousal: Alert Behavior During Therapy:  WFL for tasks assessed/performed   PT - Cognitive impairments: No apparent impairments                         Following commands: Intact      Cueing Cueing Techniques: Verbal cues, Gestural cues  Exercises  Other Exercises Other Exercises: standing at RW LLE foot taps on 7 platform x10 reps anterior prior to stair training    General Comments General comments (skin integrity, edema, etc.): SpO2 94% and above on RA; no dizziness standing but pt fatigued after stairs      Pertinent Vitals/Pain Pain Assessment Pain Assessment: Faces Faces Pain Scale: Hurts a little bit Pain Location: back, LUE, generalized Pain Descriptors / Indicators: Guarding, Grimacing Pain Intervention(s): Limited activity within patient's tolerance, Monitored during session, Repositioned    Home Living                          Prior Function            PT Goals (current goals can now be found in the care plan section) Acute Rehab PT Goals Patient Stated Goal: to go home PT Goal Formulation: With patient Time For Goal Achievement: 12/27/23 Progress towards PT goals: Progressing toward goals    Frequency    Min 2X/week      PT Plan      Co-evaluation              AM-PAC PT 6 Clicks Mobility   Outcome Measure  Help needed turning from your back to your side while in a flat bed without using bedrails?: None Help needed moving from lying on your back to sitting on the side of a flat bed without using bedrails?: None Help needed moving to and from a bed to a chair (including a wheelchair)?: A Little Help needed standing up from a chair using your arms (e.g., wheelchair or bedside chair)?: A Little Help needed to walk in hospital room?: A Little Help needed climbing 3-5 steps with a railing? : A Little 6 Click Score: 20    End of Session Equipment Utilized During Treatment: Gait belt Activity Tolerance: Patient limited by fatigue Patient left: with call bell/phone within reach;Other (comment);in chair (pt agreeable to press call bell for any needs, no chair alarm in room) Nurse Communication: Mobility status PT Visit Diagnosis: Muscle weakness (generalized) (M62.81)     Time:  8370-8358 PT Time Calculation (min) (ACUTE ONLY): 12 min  Charges:    $Therapeutic Exercise: 8-22 mins PT General Charges $$ ACUTE PT VISIT: 1 Visit                     Franke Menter P., PTA Acute Rehabilitation Services Secure Chat Preferred 9a-5:30pm Office: [phone]    [name] [facility] 12/21/2023, 6:05 PM

## 2023-12-21 NOTE — Progress Notes (Signed)
 Mobility Specialist Progress Note:   12/21/23 1435  Mobility  Activity Ambulated with assistance  Level of Assistance Standby assist, set-up cues, supervision of patient - no hands on  Assistive Device Four wheel walker  Distance Ambulated (ft) 300 ft  Activity Response Tolerated well  Mobility Referral Yes  Mobility visit 1 Mobility  Mobility Specialist Start Time (ACUTE ONLY) 1435  Mobility Specialist Stop Time (ACUTE ONLY) 1450  Mobility Specialist Time Calculation (min) (ACUTE ONLY) 15 min   Pt agreeable to mobility session. Required close supervision for safety with rollator. Mild sway noted, however no overt unsteadiness. Pt back in chair with all needs met.   Therisa Rana Mobility Specialist Please contact via SecureChat or  Rehab office at [phone]

## 2023-12-21 NOTE — Progress Notes (Signed)
 Patient ID: Douglas [name], male   DOB: March 31, 1955, 68 y.o.   MRN: [id number]     Cardiologist: Danelle Birmingham, MD   Chief Complaint: CHF  Subjective:    10/19 patient was ambulating to the bathroom with his walker. RN witnessed him fall face first and lose consciousness. CT head okay. On tele review: VT treated with ATP  10/22 R/LHC: patent bypass grafts, severe native disease, severe BiV failure with severely reduced CO w/ elevated filling pressures.  CO-OX 74%.  CVP 8. Continues on IV lasix  80 BID.   Objective:   Weight Range: 111.3 kg Body mass index is 36.22 kg/m.   Vital Signs:   Temp:  [97.4 F (36.3 C)-98.4 F (36.9 C)] 98.4 F (36.9 C) (10/25 1201) Pulse Rate:  [56-69] 65 (10/25 1201) Resp:  [19-20] 20 (10/25 1201) BP: (101-132)/(57-84) 113/57 (10/25 1200) SpO2:  [88 %-93 %] 90 % (10/25 1201) Weight:  [111.3 kg] 111.3 kg (10/25 0500) Last BM Date : 12/20/23  Weight change: Filed Weights   12/19/23 0506 12/20/23 0446 12/21/23 0500  Weight: 113.8 kg 112.9 kg 111.3 kg    Intake/Output:   Intake/Output Summary (Last 24 hours) at 12/21/2023 1228 Last data filed at 12/21/2023 1026 Gross per 24 hour  Intake 840 ml  Output 5800 ml  Net -4960 ml      Physical Exam    General:  Chronically ill appearing Cor: Irregular rhythm. No murmur. Lungs: clear Abdomen: obese, nondistended Extremities: trace edema Neuro: alert & orientedx3. Affect pleasant   Telemetry   AF 50s, occasionally PVCs  Labs    CBC Recent Labs    12/19/23 0241 12/20/23 0028  WBC 7.5 6.5  HGB 12.1* 11.9*  HCT 40.3 39.8  MCV 81.7 81.9  PLT 233 239   Basic Metabolic Panel Recent Labs    89/75/74 0430 12/21/23 0520  NA 135 130*  K 4.1 3.6  CL 93* 88*  CO2 31 31  GLUCOSE 94 97  BUN 35* 34*  CREATININE 1.33* 1.49*  CALCIUM  9.1 8.6*  MG 2.0 2.0  PHOS 4.2  --    Liver Function Tests Recent Labs    12/20/23 0430 12/21/23 0520  AST 29 38  ALT 20 26  ALKPHOS 73 74   BILITOT 1.4* 1.3*  PROT 7.1 7.2  ALBUMIN 3.1* 3.2*    No results for input(s): LIPASE, AMYLASE in the last 72 hours.  Cardiac Enzymes No results for input(s): CKTOTAL, CKMB, CKMBINDEX, TROPONINI in the last 72 hours.  BNP: BNP (last 3 results) Recent Labs    04/23/23 1840 05/28/23 0322 05/29/23 0413  BNP 253.0* 557.0* 672.0*    ProBNP (last 3 results) Recent Labs    12/12/23 1234  PROBNP 5,740.0*     D-Dimer No results for input(s): DDIMER in the last 72 hours. Hemoglobin A1C No results for input(s): HGBA1C in the last 72 hours. Fasting Lipid Panel No results for input(s): CHOL, HDL, LDLCALC, TRIG, CHOLHDL, LDLDIRECT in the last 72 hours. Thyroid Function Tests No results for input(s): TSH, T4TOTAL, T3FREE, THYROIDAB in the last 72 hours.  Invalid input(s): FREET3  Other results:  Imaging  No results found.   Medications:   Scheduled Medications:  [START ON 01/01/2024] amiodarone  200 mg Oral Daily   amiodarone  400 mg Oral BID   apixaban   5 mg Oral BID   Chlorhexidine  Gluconate Cloth  6 each Topical Daily   citalopram   20 mg Oral Daily   digoxin  0.0625  mg Oral Daily   empagliflozin   10 mg Oral Daily   ezetimibe   10 mg Oral Daily   fluticasone   1 spray Each Nare Daily   fluticasone  furoate-vilanterol  1 puff Inhalation Daily   furosemide   80 mg Intravenous BID   metoprolol  succinate  25 mg Oral Daily   mexiletine  150 mg Oral Q12H   pantoprazole   20 mg Oral Daily   rosuvastatin   40 mg Oral Daily   senna-docusate  1 tablet Oral QHS   spironolactone   25 mg Oral Daily   tamsulosin   0.4 mg Oral Daily   traZODone   50 mg Oral QHS    Infusions:     PRN Medications: acetaminophen , albuterol , ALPRAZolam , alum & mag hydroxide-simeth, artificial tears, glucagon (human recombinant), guaiFENesin, ondansetron  **OR** ondansetron  (ZOFRAN ) IV, mouth rinse, oxyCODONE -acetaminophen  **AND** oxyCODONE , polyethylene glycol,  sodium chloride , tiZANidine  Assessment/Plan   1. Acute on chronic systolic CHF: Ischemic cardiomyopathy.  [employer] ICD.  He has been on minimal GDMT at home, apparently limited by low BP.  Patient has a long-standing cardiomyopathy, echoes with EF in the 20-25% range since at least 2015.  By Dr. Rolan review of echo this admission, EF 25% with moderate RV dysfunction and dilated IVC. R/LHC 10/22: Patent bypass grafts, severe BiV failure with elevated filling pressures, Fick CI 1.6, PAPi 2.1 - CO-OX worse again today. 55>74>58 - CVP 8. Continue IV lasix  80 BID - spiro 25 mg daily - metoprolol  xl 25 mg daily (would not increase any higher with low-output HF) - Continue jardiance  10 mg daily - He has end-stage HF.  Palliative Care now on board. Appreciate assistance.   2. VT: Patient has permanent AF but was noted to have a run of VT this admission treated with ATP (dual tachycardia).  Seen by EP. He was stated on amiodarone, now po.  Trigger for VT may have been drastic drop recently in Toprol  XL dose 200 mg daily => 12.5 daily.   - Another run of VT 10/19 w/ syncopal event, looks like treated with ATP (initiated with PMVT) - Previously on 200 mg metoprolol  xl daily, decreased to 25 mg daily this admit (would not increase any further with low-output HF - No recurrent VT. Continue amiodarone 400 mg BID per EP, and mexiletine 150 BID.  3. Atrial fibrillation: Permanent.  He is currently rate-controlled.  - Continue apixaban  - Toprol  XL as above  4. AKI: Creatinine jumped 1.09 => 1.86 => 2.02 with elevated K.  He received Lokelma.  Foley placed, 500 cc urine out and abdominal pain resolved.   - Scr now 1.3 > 1.49. Hold additional metolazone. Give lasix  80 mg IV bid today and then drop to daily tomorrow.  - Now on Flomax  for suspected BPH.  - Follow closely with cath and diuresis  5. CAD: H/o CABG 2002.  Last cath in 2013 with occluded LAD and RCA, 2 occluded distal OMs.  No chest pain.   Troponin minimally elevated with no trend, suspect demand ischemia.  - Grafts are LIMA - LAD, SVG -Diag, SVG - OM2 -OM3. Patent on cath 12/18/23. - No aspirin  with need for anticoagulation - Continue Zetia  and Crestor    Length of Stay: 9  Mobilize, he will need SNF.   Douglas [name] 12/21/2023 12:28 PM

## 2023-12-21 NOTE — Plan of Care (Signed)

## 2023-12-21 NOTE — Progress Notes (Signed)
 PROGRESS NOTE    Douglas [name]  FMW:[id number] DOB: [birth year] DOA: 12/12/2023 PCP: Mancil Pfeiffer, PA-C    Brief Narrative:   68 year old with history of DM2, HTN, A-fib on Eliquis , CAD, ischemic cardiomyopathy, CHF status post AICD EF 35%, asthma, anemia of chronic disease, opioid dependence comes to the ED for acute respiratory failure.  Patient was found to be in CHF exacerbation and had concerns for recurrent ventricular tachycardia with syncope.  Patient was transferred from [facility] to [facility] for evaluation by EP, CHF and palliative care team.  Cardiology is aggressively managing diuretics as appropriate.  Assessment & Plan:  Acute exacerbation of chronic systolic CHF Hypertension -Echocardiogram shows reduced EF of 35%, cardiology team is following and adjusting diuretics as necessary.  Not a good candidate for advanced therapies.   Current meds: Toprol -XL 25 Lasix  80 IV twice daily digoxin 0.625 Aldactone  25  A-fib with RVR  Permanent A-fib Recurrent ventricular tachycardia Unfortunate recurrent ventricular tachycardia complicated by atrial fibrillation.  Also had an episode of syncope.   EP team recommended amiodarone for 100 mg twice daily for 14 days thereafter 200 mg once daily, mexiletine 150 mg twice daily and Toprol -XL.--- Note patient also on digoxin-May require dig level at some point? It looks like we are trying to avoid increasing beta-blockade? - Eliquis   Elevated troponin H/o CAD s/p PCI/CABG HLD 10/22, underwent RHC/LHC found to have severe native three-vessel CAD with severe ischemic cardiomyopathy, continued patency of all 4 bypass grafts, severe IV failure with severely decreased cardiac output with elevated filling pressures -suggestive of end-stage CHF Left heart catheterization results as mentioned above  Goals of care discussion - Palliative care team following   Acute respiratory failure with hypoxia, resolved Initially required  3 L now on room air   AKI Baseline creatinine 1.0, peaked at 2.02  Periodic labs, get magnesium  periodically it is stable given he has an arrhythmia  HypOkalemia/hypocalcemia Replete as needed  Abdominal pain Primarily presented with lower abdominal pain, difficulty with voiding.  --CT abdomen and pelvis without contrast negative for acute abnormality--Urinary bladder, ureters kidneys without signs of obstruction---LFTs and lipase were normal   Type 2 diabetes mellitus A1c 5.9 on April 2025 PTA meds-Jardiance  10 mg daily-CBGs here ranged 94-1 20 Sliding scale has been discontinued it looks like only on Jardiance  10  Chronic low back pain Chronic opiate dependence PT/OT-home health Continue oxycodone  1 tab every 6 as needed with Oxy IR to make up 10 mg  Constipation Start on scheduled and as needed bowel regimen   Anxiety/depression PTA meds-Celexa  20 mg daily, Xanax  1 mg twice daily as needed Currently on same-can use trazodone  50 for sleep   Obesity 2 Body mass index is 37.77 kg/m. Patient has been advised to  improve diet   DVT prophylaxis: apixaban  (ELIQUIS ) tablet 5 mg     Code Status: Full Code Family Communication:   Status is: Inpatient Still having signs of volume overload requiring IV Lasix .  Discharge once cleared by CHF team.   PT Follow up Recs: Home Health Pt10/20/2025 1600, face-to-face completed  Subjective:  Well no distress No SOb OOB in chair cannot feel any SVT   Examination:  Eomi ncat no focal defciit Cta b S1 s 2no m Trace edmea  Objective: Vitals:   12/20/23 2316 12/21/23 0320 12/21/23 0500 12/21/23 0722  BP:  103/79  112/67  Pulse: (!) 56 (!) 56  (!) 59  Resp:  19  19  Temp:  97.6  F (36.4 C)  97.6 F (36.4 C)  TempSrc:  Oral  Oral  SpO2: 92% 91%  92%  Weight:   111.3 kg   Height:        Intake/Output Summary (Last 24 hours) at 12/21/2023 1109 Last data filed at 12/21/2023 1026 Gross per 24 hour  Intake 840 ml  Output  7000 ml  Net -6160 ml   Filed Weights   12/19/23 0506 12/20/23 0446 12/21/23 0500  Weight: 113.8 kg 112.9 kg 111.3 kg    Scheduled Meds:  [START ON 01/01/2024] amiodarone  200 mg Oral Daily   amiodarone  400 mg Oral BID   apixaban   5 mg Oral BID   Chlorhexidine  Gluconate Cloth  6 each Topical Daily   citalopram   20 mg Oral Daily   digoxin  0.0625 mg Oral Daily   empagliflozin   10 mg Oral Daily   ezetimibe   10 mg Oral Daily   fluticasone   1 spray Each Nare Daily   fluticasone  furoate-vilanterol  1 puff Inhalation Daily   furosemide   80 mg Intravenous BID   metoprolol  succinate  25 mg Oral Daily   mexiletine  150 mg Oral Q12H   pantoprazole   20 mg Oral Daily   rosuvastatin   40 mg Oral Daily   senna-docusate  1 tablet Oral QHS   spironolactone   25 mg Oral Daily   tamsulosin   0.4 mg Oral Daily   traZODone   50 mg Oral QHS    Data Reviewed:   CBC: Recent Labs  Lab 12/16/23 0215 12/17/23 0256 12/18/23 0251 12/18/23 0931 12/18/23 0939 12/19/23 0241 12/20/23 0028  WBC 8.2 9.1 6.9  --   --  7.5 6.5  HGB 12.5* 12.7* 12.7* 15.3  15.0 10.9* 12.1* 11.9*  HCT 42.8 43.1 42.4 45.0  44.0 32.0* 40.3 39.8  MCV 83.4 83.0 82.3  --   --  81.7 81.9  PLT 258 282 243  --   --  233 239   Basic Metabolic Panel: Recent Labs  Lab 12/17/23 0256 12/18/23 0251 12/18/23 0931 12/18/23 0939 12/19/23 0241 12/20/23 0028 12/20/23 0430 12/21/23 0520  NA 131* 131* 135  135 148* 133*  --  135 130*  K 4.8 5.1 4.2  4.1 2.7* 4.4  --  4.1 3.6  CL 92* 91*  --   --  94*  --  93* 88*  CO2 31 29  --   --  29  --  31 31  GLUCOSE 109* 117*  --   --  106*  --  94 97  BUN 36* 46*  --   --  43*  --  35* 34*  CREATININE 1.56* 1.63*  --   --  1.40*  --  1.33* 1.49*  CALCIUM  9.0 8.9  --   --  8.8*  --  9.1 8.6*  MG 2.6* 2.4  --   --  2.3 2.0 2.0 2.0  PHOS  --   --   --   --   --   --  4.2  --    GFR: Estimated Creatinine Clearance: 59.1 mL/min (A) (by C-G formula based on SCr of 1.49 mg/dL  (H)). Liver Function Tests: Recent Labs  Lab 12/20/23 0430 12/21/23 0520  AST 29 38  ALT 20 26  ALKPHOS 73 74  BILITOT 1.4* 1.3*  PROT 7.1 7.2  ALBUMIN 3.1* 3.2*   No results for input(s): LIPASE, AMYLASE in the last 168 hours. No results for input(s): AMMONIA in the  last 168 hours. Coagulation Profile: No results for input(s): INR, PROTIME in the last 168 hours. Cardiac Enzymes: No results for input(s): CKTOTAL, CKMB, CKMBINDEX, TROPONINI in the last 168 hours. BNP (last 3 results) Recent Labs    12/12/23 1234  PROBNP 5,740.0*   HbA1C: No results for input(s): HGBA1C in the last 72 hours. CBG: No results for input(s): GLUCAP in the last 168 hours.  Lipid Profile: No results for input(s): CHOL, HDL, LDLCALC, TRIG, CHOLHDL, LDLDIRECT in the last 72 hours. Thyroid Function Tests: No results for input(s): TSH, T4TOTAL, FREET4, T3FREE, THYROIDAB in the last 72 hours. Anemia Panel: No results for input(s): VITAMINB12, FOLATE, FERRITIN, TIBC, IRON, RETICCTPCT in the last 72 hours. Sepsis Labs: No results for input(s): PROCALCITON, LATICACIDVEN in the last 168 hours.    LOS: 9 days   Time spent= 35 mins    [name], MD [facility]  If 7PM-7AM, please contact night-coverage  12/21/2023, 11:09 AM

## 2023-12-22 DIAGNOSIS — I472 Ventricular tachycardia, unspecified: Secondary | ICD-10-CM | POA: Diagnosis not present

## 2023-12-22 LAB — CBC WITH DIFFERENTIAL/PLATELET
Basophils Absolute: 0.1 K/uL (ref 0.0–0.1)
Basophils Relative: 2 %
Eosinophils Absolute: 0.4 K/uL (ref 0.0–0.5)
Eosinophils Relative: 5 %
HCT: 45.3 % (ref 39.0–52.0)
Hemoglobin: 13.8 g/dL (ref 13.0–17.0)
Lymphocytes Relative: 17 %
Lymphs Abs: 1.2 K/uL (ref 0.7–4.0)
MCH: 24.6 pg — ABNORMAL LOW (ref 26.0–34.0)
MCHC: 30.5 g/dL (ref 30.0–36.0)
MCV: 80.9 fL (ref 80.0–100.0)
Monocytes Absolute: 0.7 K/uL (ref 0.1–1.0)
Monocytes Relative: 9 %
Neutro Abs: 4.9 K/uL (ref 1.7–7.7)
Neutrophils Relative %: 67 %
Platelets: 317 K/uL (ref 150–400)
RBC: 5.6 MIL/uL (ref 4.22–5.81)
RDW: 21.8 % — ABNORMAL HIGH (ref 11.5–15.5)
WBC: 7.3 K/uL (ref 4.0–10.5)
nRBC: 0 % (ref 0.0–0.2)

## 2023-12-22 LAB — COMPREHENSIVE METABOLIC PANEL WITH GFR
ALT: 32 U/L (ref 0–44)
AST: 44 U/L — ABNORMAL HIGH (ref 15–41)
Albumin: 3.5 g/dL (ref 3.5–5.0)
Alkaline Phosphatase: 91 U/L (ref 38–126)
Anion gap: 13 (ref 5–15)
BUN: 41 mg/dL — ABNORMAL HIGH (ref 8–23)
CO2: 34 mmol/L — ABNORMAL HIGH (ref 22–32)
Calcium: 9.4 mg/dL (ref 8.9–10.3)
Chloride: 86 mmol/L — ABNORMAL LOW (ref 98–111)
Creatinine, Ser: 1.63 mg/dL — ABNORMAL HIGH (ref 0.61–1.24)
GFR, Estimated: 46 mL/min — ABNORMAL LOW (ref >60)
Glucose, Bld: 106 mg/dL — ABNORMAL HIGH (ref 70–99)
Potassium: 4.3 mmol/L (ref 3.5–5.1)
Sodium: 133 mmol/L — ABNORMAL LOW (ref 135–145)
Total Bilirubin: 1.3 mg/dL — ABNORMAL HIGH (ref 0.0–1.2)
Total Protein: 7.9 g/dL (ref 6.5–8.1)

## 2023-12-22 LAB — COOXEMETRY PANEL
Carboxyhemoglobin: 2.5 % — ABNORMAL HIGH (ref 0.5–1.5)
Methemoglobin: 1.7 % — ABNORMAL HIGH (ref 0.0–1.5)
O2 Saturation: 66.1 %
Total hemoglobin: 14.6 g/dL (ref 12.0–16.0)

## 2023-12-22 LAB — MAGNESIUM: Magnesium: 2.5 mg/dL — ABNORMAL HIGH (ref 1.7–2.4)

## 2023-12-22 MED ORDER — POLYETHYLENE GLYCOL 3350 17 G PO PACK: 17.0000 g | PACK | Freq: Two times a day (BID) | ORAL | Status: DC

## 2023-12-22 MED ORDER — SENNOSIDES-DOCUSATE SODIUM 8.6-50 MG PO TABS
1.0000 | ORAL_TABLET | Freq: Two times a day (BID) | ORAL | Status: DC
  Administered 2023-12-22 – 2023-12-28 (×9): 1 via ORAL
  Filled 2023-12-22 (×11): qty 1

## 2023-12-22 MED ORDER — POLYETHYLENE GLYCOL 3350 17 G PO PACK
17.0000 g | PACK | Freq: Two times a day (BID) | ORAL | Status: DC
  Administered 2023-12-22 – 2023-12-25 (×7): 17 g via ORAL
  Filled 2023-12-22 (×11): qty 1

## 2023-12-22 MED ORDER — SENNOSIDES-DOCUSATE SODIUM 8.6-50 MG PO TABS: 1.0000 | ORAL_TABLET | Freq: Two times a day (BID) | ORAL | Status: DC

## 2023-12-22 NOTE — Plan of Care (Signed)

## 2023-12-22 NOTE — Progress Notes (Signed)
 Mobility Specialist Progress Note:   12/22/23 1145  Mobility  Activity Ambulated with assistance  Level of Assistance Contact guard assist, steadying assist  Assistive Device Four wheel walker  Distance Ambulated (ft) 400 ft  Activity Response Tolerated well  Mobility Referral Yes  Mobility visit 1 Mobility  Mobility Specialist Start Time (ACUTE ONLY) 1145  Mobility Specialist Stop Time (ACUTE ONLY) 1200  Mobility Specialist Time Calculation (min) (ACUTE ONLY) 15 min   Pt agreeable to mobility session. Required only CGA for safety throughout ambulation with rollator. No overt unsteadiness noted. Pt back in chair with all needs met.  Therisa Rana Mobility Specialist Please contact via SecureChat or  Rehab office at [phone]

## 2023-12-22 NOTE — Progress Notes (Signed)
 Patient ID: Douglas [name], male   DOB: 03/14/1955, 68 y.o.   MRN: [id number]     Cardiologist: Danelle Birmingham, MD   Chief Complaint: CHF  Subjective:    10/19 patient was ambulating to the bathroom with his walker. RN witnessed him fall face first and lose consciousness. CT head okay. On tele review: VT treated with ATP  10/22 R/LHC: patent bypass grafts, severe native disease, severe BiV failure with severely reduced CO w/ elevated filling pressures.  CO-OX 74%>58>66   Objective:   Weight Range: 112.8 kg Body mass index is 36.72 kg/m.   Vital Signs:   Temp:  [97.4 F (36.3 C)-98.4 F (36.9 C)] 97.7 F (36.5 C) (10/26 0725) Pulse Rate:  [51-69] 64 (10/26 1056) Resp:  [16-20] 18 (10/26 0809) BP: (91-121)/(56-85) 119/73 (10/26 1056) SpO2:  [90 %-95 %] 95 % (10/26 0809) Weight:  [112.8 kg] 112.8 kg (10/26 0530) Last BM Date : 12/20/23  Weight change: Filed Weights   12/20/23 0446 12/21/23 0500 12/22/23 0530  Weight: 112.9 kg 111.3 kg 112.8 kg    Intake/Output:   Intake/Output Summary (Last 24 hours) at 12/22/2023 1149 Last data filed at 12/22/2023 0838 Gross per 24 hour  Intake 120 ml  Output --  Net 120 ml      Physical Exam    General:  Chronically ill appearing Cor: Irregular rhythm. No murmur. Lungs: clear Abdomen: obese, nondistended Extremities: trace edema Neuro: alert & orientedx3. Affect pleasant   Telemetry   AF 50s, occasionally PVCs  Labs    CBC Recent Labs    12/20/23 0028 12/22/23 0545  WBC 6.5 7.3  NEUTROABS  --  4.9  HGB 11.9* 13.8  HCT 39.8 45.3  MCV 81.9 80.9  PLT 239 317   Basic Metabolic Panel Recent Labs    89/75/74 0430 12/21/23 0520 12/22/23 0545  NA 135 130* 133*  K 4.1 3.6 4.3  CL 93* 88* 86*  CO2 31 31 34*  GLUCOSE 94 97 106*  BUN 35* 34* 41*  CREATININE 1.33* 1.49* 1.63*  CALCIUM  9.1 8.6* 9.4  MG 2.0 2.0 2.5*  PHOS 4.2  --   --    Liver Function Tests Recent Labs    12/21/23 0520 12/22/23 0545   AST 38 44*  ALT 26 32  ALKPHOS 74 91  BILITOT 1.3* 1.3*  PROT 7.2 7.9  ALBUMIN 3.2* 3.5    No results for input(s): LIPASE, AMYLASE in the last 72 hours.  Cardiac Enzymes No results for input(s): CKTOTAL, CKMB, CKMBINDEX, TROPONINI in the last 72 hours.  BNP: BNP (last 3 results) Recent Labs    04/23/23 1840 05/28/23 0322 05/29/23 0413  BNP 253.0* 557.0* 672.0*    ProBNP (last 3 results) Recent Labs    12/12/23 1234  PROBNP 5,740.0*     D-Dimer No results for input(s): DDIMER in the last 72 hours. Hemoglobin A1C No results for input(s): HGBA1C in the last 72 hours. Fasting Lipid Panel No results for input(s): CHOL, HDL, LDLCALC, TRIG, CHOLHDL, LDLDIRECT in the last 72 hours. Thyroid Function Tests No results for input(s): TSH, T4TOTAL, T3FREE, THYROIDAB in the last 72 hours.  Invalid input(s): FREET3  Other results:  Imaging  No results found.   Medications:   Scheduled Medications:  [START ON 01/01/2024] amiodarone  200 mg Oral Daily   amiodarone  400 mg Oral BID   apixaban   5 mg Oral BID   Chlorhexidine  Gluconate Cloth  6 each Topical Daily   citalopram   20 mg Oral Daily   digoxin  0.0625 mg Oral Daily   empagliflozin   10 mg Oral Daily   ezetimibe   10 mg Oral Daily   fluticasone   1 spray Each Nare Daily   fluticasone  furoate-vilanterol  1 puff Inhalation Daily   metoprolol  succinate  25 mg Oral Daily   mexiletine  150 mg Oral Q12H   pantoprazole   20 mg Oral Daily   polyethylene glycol  17 g Oral Daily   rosuvastatin   40 mg Oral Daily   senna-docusate  1 tablet Oral BID   spironolactone   25 mg Oral Daily   tamsulosin   0.4 mg Oral Daily   traZODone   50 mg Oral QHS    Infusions:     PRN Medications: acetaminophen , albuterol , ALPRAZolam , alum & mag hydroxide-simeth, artificial tears, glucagon (human recombinant), guaiFENesin, ondansetron  **OR** ondansetron  (ZOFRAN ) IV, mouth rinse,  oxyCODONE -acetaminophen  **AND** oxyCODONE , sodium chloride , tiZANidine  Assessment/Plan   1. Acute on chronic systolic CHF: Ischemic cardiomyopathy.  Medtronic ICD.  He has been on minimal GDMT at home, apparently limited by low BP.  Patient has a long-standing cardiomyopathy, echoes with EF in the 20-25% range since at least 2015.  By Dr. Rolan review of echo this admission, EF 25% with moderate RV dysfunction and dilated IVC. R/LHC 10/22: Patent bypass grafts, severe BiV failure with elevated filling pressures, Fick CI 1.6, PAPi 2.1 - CO-OX worse again today. 55>74>58>66 - Cr 1.33>1.49>1.63. Hold lasix  today. Likely start oral tomorrow.  - spiro 25 mg daily - metoprolol  xl 25 mg daily (would not increase any higher with low-output HF) - Continue jardiance  10 mg daily - He has end-stage HF.  Palliative Care now on board. Appreciate assistance.   2. VT: Patient has permanent AF but was noted to have a run of VT this admission treated with ATP (dual tachycardia).  Seen by EP. He was stated on amiodarone, now po.  Trigger for VT may have been drastic drop recently in Toprol  XL dose 200 mg daily => 12.5 daily.   - Another run of VT 10/19 w/ syncopal event, looks like treated with ATP (initiated with PMVT) - Previously on 200 mg metoprolol  xl daily, decreased to 25 mg daily this admit (would not increase any further with low-output HF - No recurrent VT. Continue amiodarone 400 mg BID per EP, and mexiletine 150 BID.  3. Atrial fibrillation: Permanent.  He is currently rate-controlled.  - Continue apixaban  - Toprol  XL as above  4. AKI: Creatinine jumped 1.09 => 1.86 => 2.02 with elevated K.  He received Lokelma.  Foley placed, 500 cc urine out and abdominal pain resolved.   - Scr now 1.3 > 1.49. Hold additional lasix  today, po tomorrow.  - Now on Flomax  for suspected BPH.  - Follow closely with cath and diuresis  5. CAD: H/o CABG 2002.  Last cath in 2013 with occluded LAD and RCA, 2 occluded  distal OMs.  No chest pain.  Troponin minimally elevated with no trend, suspect demand ischemia.  - Grafts are LIMA - LAD, SVG -Diag, SVG - OM2 -OM3. Patent on cath 12/18/23. - No aspirin  with need for anticoagulation - Continue Zetia  and Crestor    Length of Stay: 10  Mobilize, he will need SNF.   Soyla DELENA Merck 12/22/2023 11:49 AM

## 2023-12-22 NOTE — Progress Notes (Signed)
 [facility]  ROUNDING NOTE Douglas [name] FMW:[id number]  DOB: 04/13/1955  DOA: 12/12/2023  PCP: [name], Courtney, PA-C  12/22/2023,1:41 PM  LOS: 10 days    Code Status: Full code     from: Home   68 year old white male CAD + CABG 2000-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 tach requiring electrical cardioversion-PPM placed subsequently Permanent A-fib CHADVASC >4 previously on Eliquis  Chronic pain opioid dependence  Chronology  10/17 presented to [facility] nausea vomiting urgency hypoxic volume overload BNP 5716 DVT treated with atrial tachycardic pacing and converted to A-fib started on Amio drip  EP consulted for 2 minutes of VT 10/20 fell face first-CODE BLUE called became responsive on his own--- mexiletine was added 10/22 heart cath performed 100% RCA, 100% mid CX, 100% proximal LAD to mid LAD stenosis severe native three-vessel disease continued patency of 4 bypasses severe biventricular failure with decreased severe cardiac output-felt by cardiology to add any meds he is end-stage   Not a candidate for heart failure team for inotropes given VT history 10/22  palliative care was consulted  He currently is awaiting skilled facility placement   Pertinent imaging/studies till date  Echocardiogram 10/17--30 to 35% mitral valve normal in structure similar wall motion abnormalities with worse function     Assessment  & Plan :    Cardiac Acute exacerbation of HFmrEF current EF 30-35% Current meds = Aldactone  25-rest of diuresis stopped 10/26 Defer to cardiology volume management etc.-sounds slightly changed more azotemic Continues on Jardiance  10 daily VT Permanent A-fib with PPM in place CHADVASC >4 Difficult to control-continues amiodarone 400 twice daily with no plans of down titration, continues digoxin 0.0625 (this was started this hospital stay) Continue metoprolol  XL 25-cannot uptitrate  beta-blocker given severe biventricular failure and low output failure Continues mexiletine 150 every 12 per EP instructions Anticoagulate for CHA2DS2-VASc >4 with Eliquis  5 twice daily watch for bleeding CABG 2002 Aspirin  has been discontinued given need for anticoagulation Renal Worsening azotemia over the past 3 days but the baseline creatinine is in the 1.6 range anyway in 10/22?-More concerning is he is developing contraction alkalosis with a CO2 of 34 Potentially adding Diamox in the next several days? Watch potassium as is on Aldactone   continue Flomax  0.4 daily Acute respiratory failure with hypoxia Still on oxygen Desat screen prior to discharge Endocrine BMI 36 grade 2 obesity  continues Jardiance  10 as above Psych chronic pain Continue Xanax  1 twice daily as needed anxiety, Celexa  20 daily, trazodone  50 at bedtime Disposition Likely skilled when available  Data Reviewed today:  Sodium 133 potassium 4.3 BUN/creatinine 41/1.6 magnesium  2.5 AST/ALT 44/32 bilirubin 1.3 WBC 7.3 hemoglobin 13.8 platelet 317   DVT prophylaxis: On Eliquis   Status is: Inpatient Remains inpatient appropriate because:   Needs skilled stabilized    Dispo/Global plan: Palliative care to follow at skilled facility   Time 50 minutes   Subjective:   Awake coherent has a litany of complaints-was left on the toilet for an hour he says, does not like cold food etc. etc. no chest pain no lower extremity edema overall feels fair walked in hallways with mobility but needed rest Objective + exam Vitals:   12/22/23 0725 12/22/23 0809 12/22/23 1048 12/22/23 1056  BP: 91/66  119/73 119/73  Pulse: (!) 55 63 64 64  Resp: 18 18 18    Temp: 97.7 F (36.5 C)  98.2 F (36.8 C)   TempSrc: Oral  Oral  SpO2: 93% 95% 97%   Weight:      Height:       Filed Weights   12/20/23 0446 12/21/23 0500 12/22/23 0530  Weight: 112.9 kg 111.3 kg 112.8 kg     Examination: EOMI on left side he has postop  changes to the right Thick neck Cannot appreciate any submandibular lymphadenopathy or thyromegaly S1-S2 VT intermittently on monitors Chest is clear no wheeze Abdomen is obese He still has a central line in the left neck He has trace lower extremity edema He is euthymic but a little bit irritable     Scheduled Meds:  [START ON 01/01/2024] amiodarone  200 mg Oral Daily   amiodarone  400 mg Oral BID   apixaban   5 mg Oral BID   Chlorhexidine  Gluconate Cloth  6 each Topical Daily   citalopram   20 mg Oral Daily   digoxin  0.0625 mg Oral Daily   empagliflozin   10 mg Oral Daily   ezetimibe   10 mg Oral Daily   fluticasone   1 spray Each Nare Daily   fluticasone  furoate-vilanterol  1 puff Inhalation Daily   metoprolol  succinate  25 mg Oral Daily   mexiletine  150 mg Oral Q12H   pantoprazole   20 mg Oral Daily   polyethylene glycol  17 g Oral BID   rosuvastatin   40 mg Oral Daily   senna-docusate  1 tablet Oral BID   spironolactone   25 mg Oral Daily   tamsulosin   0.4 mg Oral Daily   traZODone   50 mg Oral QHS   Continuous Infusions: acetaminophen , albuterol , ALPRAZolam , alum & mag hydroxide-simeth, artificial tears, glucagon (human recombinant), guaiFENesin, ondansetron  **OR** ondansetron  (ZOFRAN ) IV, mouth rinse, oxyCODONE -acetaminophen  **AND** oxyCODONE , sodium chloride , tiZANidine   Douglas Ardella Chhim, MD  [facility]

## 2023-12-22 NOTE — Progress Notes (Signed)
 Mobility Specialist Progress Note:   12/22/23 1500  Mobility  Activity Ambulated with assistance  Level of Assistance Contact guard assist, steadying assist  Assistive Device Front wheel walker  Distance Ambulated (ft) 400 ft  Activity Response Tolerated well  Mobility Referral Yes  Mobility visit 1 Mobility  Mobility Specialist Start Time (ACUTE ONLY) 1500  Mobility Specialist Stop Time (ACUTE ONLY) 1514  Mobility Specialist Time Calculation (min) (ACUTE ONLY) 14 min   Pt eager for second mobility session. C/o sciatic nerve pain from positioning in bed. No overt unsteadiness noted. Pt left sitting in chair with all needs met.   Therisa Rana Mobility Specialist Please contact via SecureChat or  Rehab office at [phone]

## 2023-12-23 DIAGNOSIS — I472 Ventricular tachycardia, unspecified: Secondary | ICD-10-CM | POA: Diagnosis not present

## 2023-12-23 LAB — COOXEMETRY PANEL
Carboxyhemoglobin: 1.6 % — ABNORMAL HIGH (ref 0.5–1.5)
Methemoglobin: <0.7 % (ref 0.0–1.5)
O2 Saturation: 58 %
Total hemoglobin: 13.2 g/dL (ref 12.0–16.0)

## 2023-12-23 LAB — COMPREHENSIVE METABOLIC PANEL WITH GFR
ALT: 31 U/L (ref 0–44)
AST: 40 U/L (ref 15–41)
Albumin: 3.6 g/dL (ref 3.5–5.0)
Alkaline Phosphatase: 91 U/L (ref 38–126)
Anion gap: 13 (ref 5–15)
BUN: 37 mg/dL — ABNORMAL HIGH (ref 8–23)
CO2: 33 mmol/L — ABNORMAL HIGH (ref 22–32)
Calcium: 9.3 mg/dL (ref 8.9–10.3)
Chloride: 86 mmol/L — ABNORMAL LOW (ref 98–111)
Creatinine, Ser: 1.63 mg/dL — ABNORMAL HIGH (ref 0.61–1.24)
GFR, Estimated: 46 mL/min — ABNORMAL LOW (ref >60)
Glucose, Bld: 134 mg/dL — ABNORMAL HIGH (ref 70–99)
Potassium: 3.7 mmol/L (ref 3.5–5.1)
Sodium: 132 mmol/L — ABNORMAL LOW (ref 135–145)
Total Bilirubin: 1 mg/dL (ref 0.0–1.2)
Total Protein: 8.2 g/dL — ABNORMAL HIGH (ref 6.5–8.1)

## 2023-12-23 LAB — CBC WITH DIFFERENTIAL/PLATELET
Abs Immature Granulocytes: 0.01 K/uL (ref 0.00–0.07)
Basophils Absolute: 0.1 K/uL (ref 0.0–0.1)
Basophils Relative: 1 %
Eosinophils Absolute: 0.2 K/uL (ref 0.0–0.5)
Eosinophils Relative: 2 %
HCT: 46.2 % (ref 39.0–52.0)
Hemoglobin: 13.9 g/dL (ref 13.0–17.0)
Immature Granulocytes: 0 %
Lymphocytes Relative: 21 %
Lymphs Abs: 1.5 K/uL (ref 0.7–4.0)
MCH: 24.6 pg — ABNORMAL LOW (ref 26.0–34.0)
MCHC: 30.1 g/dL (ref 30.0–36.0)
MCV: 81.8 fL (ref 80.0–100.0)
Monocytes Absolute: 0.7 K/uL (ref 0.1–1.0)
Monocytes Relative: 9 %
Neutro Abs: 4.7 K/uL (ref 1.7–7.7)
Neutrophils Relative %: 67 %
Platelets: 304 K/uL (ref 150–400)
RBC: 5.65 MIL/uL (ref 4.22–5.81)
RDW: 21.7 % — ABNORMAL HIGH (ref 11.5–15.5)
WBC: 7.1 K/uL (ref 4.0–10.5)
nRBC: 0 % (ref 0.0–0.2)

## 2023-12-23 LAB — MAGNESIUM: Magnesium: 2.5 mg/dL — ABNORMAL HIGH (ref 1.7–2.4)

## 2023-12-23 MED ORDER — TORSEMIDE 20 MG PO TABS
40.0000 mg | ORAL_TABLET | Freq: Every day | ORAL | Status: DC
  Administered 2023-12-23 – 2023-12-28 (×6): 40 mg via ORAL
  Filled 2023-12-23 (×6): qty 2

## 2023-12-23 MED ORDER — ALTEPLASE 2 MG IJ SOLR
2.0000 mg | Freq: Once | INTRAMUSCULAR | Status: AC
  Administered 2023-12-23: 2 mg
  Filled 2023-12-23: qty 2

## 2023-12-23 MED ORDER — BISACODYL 10 MG RE SUPP
10.0000 mg | Freq: Once | RECTAL | Status: AC
  Administered 2023-12-23: 10 mg via RECTAL
  Filled 2023-12-23: qty 1

## 2023-12-23 NOTE — Progress Notes (Signed)
 Mobility Specialist Progress Note:   12/23/23 1030  Mobility  Activity Ambulated with assistance  Level of Assistance Contact guard assist, steadying assist  Assistive Device None  Distance Ambulated (ft) 400 ft  Activity Response Tolerated well  Mobility Referral Yes  Mobility visit 1 Mobility  Mobility Specialist Start Time (ACUTE ONLY) 1030  Mobility Specialist Stop Time (ACUTE ONLY) 1048  Mobility Specialist Time Calculation (min) (ACUTE ONLY) 18 min   Pt agreeable to mobility session. Required CGA during ambulation with no AD use. No overt unsteadiness noted. Pt back in chair with all needs met.   Therisa Rana Mobility Specialist Please contact via SecureChat or  Rehab office at [phone]

## 2023-12-23 NOTE — Progress Notes (Signed)
 Patient refused to used urinal or urinal hat for the commode to urinate. Says he can't stand it and will not use it.

## 2023-12-23 NOTE — Progress Notes (Addendum)
 Patient ID: Douglas [name], male   DOB: May 12, 1955, 68 y.o.   MRN: [id number]     Cardiologist: Danelle Birmingham, MD   Chief Complaint: CHF  Subjective:   10/19 patient was ambulating to the bathroom with his walker. RN witnessed him fall face first and lose consciousness. CT head okay. On tele review: VT treated with ATP 10/22 R/LHC: patent bypass grafts, severe native disease, severe BiV failure with severely reduced CO w/ elevated filling pressures.  CO-OX 58%. Weight down 4lbs. No UOP charted, x2 urine occurrences. CVP 11  Sitting on edge of bed. No complaints.   Objective:   Weight Range: 111 kg Body mass index is 36.14 kg/m.   Vital Signs:   Temp:  [97.5 F (36.4 C)-98.2 F (36.8 C)] 97.5 F (36.4 C) (10/27 0539) Pulse Rate:  [51-64] 52 (10/27 0539) Resp:  [18-20] 18 (10/27 0539) BP: (98-123)/(61-81) 100/61 (10/27 0539) SpO2:  [92 %-97 %] 92 % (10/27 0539) Weight:  [888 kg] 111 kg (10/27 0500) Last BM Date : 12/20/23  Weight change: Filed Weights   12/21/23 0500 12/22/23 0530 12/23/23 0500  Weight: 111.3 kg 112.8 kg 111 kg    Intake/Output:   Intake/Output Summary (Last 24 hours) at 12/23/2023 0741 Last data filed at 12/22/2023 1854 Gross per 24 hour  Intake 600 ml  Output --  Net 600 ml     CVP 11 (sitting) Physical Exam  General:  elderly appearing.  No respiratory difficulty Neck: JVD ~11 cm.  Cor: Regular rate & irregular rhythm. No murmurs. Lungs: clear, diminished bases Extremities: no edema  Neuro: alert & oriented x 2-3. Affect pleasant.   Telemetry   AF 50s, occasionally PVCs (Personally reviewed)    Labs    CBC Recent Labs    12/22/23 0545  WBC 7.3  NEUTROABS 4.9  HGB 13.8  HCT 45.3  MCV 80.9  PLT 317   Basic Metabolic Panel Recent Labs    89/74/74 0520 12/22/23 0545  NA 130* 133*  K 3.6 4.3  CL 88* 86*  CO2 31 34*  GLUCOSE 97 106*  BUN 34* 41*  CREATININE 1.49* 1.63*  CALCIUM  8.6* 9.4  MG 2.0 2.5*   Liver Function  Tests Recent Labs    12/21/23 0520 12/22/23 0545  AST 38 44*  ALT 26 32  ALKPHOS 74 91  BILITOT 1.3* 1.3*  PROT 7.2 7.9  ALBUMIN 3.2* 3.5    No results for input(s): LIPASE, AMYLASE in the last 72 hours.  Cardiac Enzymes No results for input(s): CKTOTAL, CKMB, CKMBINDEX, TROPONINI in the last 72 hours.  BNP: BNP (last 3 results) Recent Labs    04/23/23 1840 05/28/23 0322 05/29/23 0413  BNP 253.0* 557.0* 672.0*    ProBNP (last 3 results) Recent Labs    12/12/23 1234  PROBNP 5,740.0*     D-Dimer No results for input(s): DDIMER in the last 72 hours. Hemoglobin A1C No results for input(s): HGBA1C in the last 72 hours. Fasting Lipid Panel No results for input(s): CHOL, HDL, LDLCALC, TRIG, CHOLHDL, LDLDIRECT in the last 72 hours. Thyroid Function Tests No results for input(s): TSH, T4TOTAL, T3FREE, THYROIDAB in the last 72 hours.  Invalid input(s): FREET3  Other results:  Imaging  No results found.   Medications:   Scheduled Medications:  [START ON 01/01/2024] amiodarone  200 mg Oral Daily   amiodarone  400 mg Oral BID   apixaban   5 mg Oral BID   Chlorhexidine  Gluconate Cloth  6 each Topical  Daily   citalopram   20 mg Oral Daily   digoxin  0.0625 mg Oral Daily   empagliflozin   10 mg Oral Daily   ezetimibe   10 mg Oral Daily   fluticasone   1 spray Each Nare Daily   fluticasone  furoate-vilanterol  1 puff Inhalation Daily   metoprolol  succinate  25 mg Oral Daily   mexiletine  150 mg Oral Q12H   pantoprazole   20 mg Oral Daily   polyethylene glycol  17 g Oral BID   rosuvastatin   40 mg Oral Daily   senna-docusate  1 tablet Oral BID   spironolactone   25 mg Oral Daily   tamsulosin   0.4 mg Oral Daily   traZODone   50 mg Oral QHS    Infusions:     PRN Medications: acetaminophen , albuterol , ALPRAZolam , alum & mag hydroxide-simeth, artificial tears, glucagon (human recombinant), guaiFENesin, ondansetron  **OR**  ondansetron  (ZOFRAN ) IV, mouth rinse, oxyCODONE -acetaminophen  **AND** oxyCODONE , sodium chloride , tiZANidine  Assessment/Plan   1. Acute on chronic systolic CHF: Ischemic cardiomyopathy.  Medtronic ICD.  He has been on minimal GDMT at home, apparently limited by low BP.  Patient has a long-standing cardiomyopathy, echoes with EF in the 20-25% range since at least 2015.  By Dr. Rolan review of echo this admission, EF 25% with moderate RV dysfunction and dilated IVC. R/LHC 10/22: Patent bypass grafts, severe BiV failure with elevated filling pressures, Fick CI 1.6, PAPi 2.1 - CO-OX 58% - Cr 1.33>1.49>1.63>pending. Start Torsemide  40 mg daily.  - Continue spiro 25 mg daily - Continue metoprolol  xl 25 mg daily (would not increase any higher with low-output HF) - Continue jardiance  10 mg daily - Continue digoxin 0.0625 - He has end-stage HF.  Palliative Care now on board. Appreciate assistance.   2. VT: Patient has permanent AF but was noted to have a run of VT this admission treated with ATP (dual tachycardia).  Seen by EP. He was stated on amiodarone, now po.  Trigger for VT may have been drastic drop recently in Toprol  XL dose 200 mg daily => 12.5 daily.   - Another run of VT 10/19 w/ syncopal event, looks like treated with ATP (initiated with PMVT) - Previously on 200 mg metoprolol  xl daily, decreased to 25 mg daily this admit (would not increase any further with low-output HF - No recurrent VT. Continue amiodarone 400 mg BID per EP, and mexiletine 150 BID.  3. Atrial fibrillation: Permanent.  He is currently rate-controlled.  - Continue apixaban  - Toprol  XL as above  4. AKI: Creatinine jumped 1.09 => 1.86 => 2.02 with elevated K.  He received Lokelma.  Foley placed, 500 cc urine out and abdominal pain resolved.  Foley now removed.  - Scr now 1.3 > 1.49> ?? Pending. Diuretics held yesterday. Start PO diuretics as above.  - Now on Flomax  for suspected BPH.   5. CAD: H/o CABG 2002.  Last cath  in 2013 with occluded LAD and RCA, 2 occluded distal OMs.  No chest pain.  Troponin minimally elevated with no trend, suspect demand ischemia.  - Grafts are LIMA - LAD, SVG -Diag, SVG - OM2 -OM3. Patent on cath 12/18/23. - No aspirin  with need for anticoagulation - Continue Zetia  and Crestor   Continue to mobilize. Awaiting SNF.   Length of Stay: 45   Beckey LITTIE Coe 12/23/2023 7:41 AM  Patient seen with NP, I formulated the plan and agree with the above note.   SBP 90s-100s, co-ox 58%.  No BMET yet today.  Weight is  down. CVP 10-11 range.   Breathing improved.   General: NAD Neck: JVP 8 cm, no thyromegaly or thyroid nodule.  Lungs: Clear to auscultation bilaterally with normal respiratory effort. CV: Nondisplaced PMI.  Heart irregular S1/S2, no S3/S4, no murmur.  Trace ankle edema.   Abdomen: Soft, nontender, no hepatosplenomegaly, no distention.  Skin: Intact without lesions or rashes.  Neurologic: Alert and oriented x 3.  Psych: Normal affect. Extremities: No clubbing or cyanosis.  HEENT: Normal.   I suspect mild residual volume overload but I think we've pushed as much as we can aggressively.  Agree with transition to torsemide  40 mg daily today. Awaiting BMET.   Low cardiac output by RHC.  He is not a good candidate for advanced therapies, he has been minimally ambulatory at home.  Co-ox 58% today.  I will try to avoid milrinone with VT.   Continue digoxin, Jardiance , and spironolactone  25 mg daily.  I will hold off on further GDMT with rise in creatinine (pending BMET today).     No further VT.  Would not increase Toprol  XL with low output HF.  Continue amiodarone and mexiletine.    Chronic AF, on apixaban .    Mobilize, he will need SNF.   Ezra Shuck 12/23/2023 9:30 AM

## 2023-12-23 NOTE — Progress Notes (Signed)
 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 tach requiring electrical cardioversion-PPM placed subsequently Permanent A-fib CHADVASC >4 previously on Eliquis  Chronic pain opioid dependence  Chronology  10/17 presented to [facility] nausea vomiting urgency hypoxic volume overload BNP 5716 DVT treated with atrial tachycardic pacing and converted to A-fib started on Amio drip  EP consulted for 2 minutes of VT 10/20 fell face first-CODE BLUE called became responsive on his own--- mexiletine was added 10/22 heart cath performed 100% RCA, 100% mid CX, 100% proximal LAD to mid LAD stenosis severe native three-vessel disease continued patency of 4 bypasses severe biventricular failure with decreased severe cardiac output-felt by cardiology to add any meds he is end-stage   Not a candidate for heart failure team for inotropes given VT history 10/22  palliative care was consulted  He currently is awaiting skilled facility placement   Pertinent imaging/studies till date  Echocardiogram 10/17--30 to 35% mitral valve normal in structure similar wall motion abnormalities with worse function   Assessment  & Plan :    Cardiac Acute exacerbation of HFmrEF current EF 30-35% Cardiology stilling management Current meds = Aldactone  25 metoprolol  XL 25--torsemide  40 mg resumed 12/23/2023 orally Permanent A-fib with PPM in place CHADVASC >4 Recalcitrant-amiodarone 400 until 11/5 and then 200 dosing Cannot increase BB 2/2 biventricular failure per heart failure team Continues mexiletine 150 every 12 per EP instructions, continue digoxin 0.0625-please consider digoxin level after 7 days Eliquis  5 twice  daily CABG 2002 Aspirin  has been discontinued given need for anticoagulation Renal Worsening azotemia over the past 3 days but the baseline creatinine is in the 1.6 range anyway in 10/22? Await labs from this morning not drawn Acute respiratory failure with hypoxia Still on oxygen Ambulate with therapy today obtain desat screen Endocrine BMI 36 grade 2 obesity  continues Jardiance  10 as above Psych chronic pain Continue Xanax  1 twice daily as needed anxiety, Celexa  20 daily, trazodone  50 at bedtime Disposition Likely skilled when available  Data Reviewed today:   Labs are pending   DVT prophylaxis: On Eliquis   Status is: Inpatient Remains inpatient appropriate because:   Needs skilled stabilized    Dispo/Global plan: Palliative care to follow at skilled facility   Time 50 minutes   Subjective:   Complains about being left to sit on toilet Constipated has not been able to go for several days currently still on MiraLAX  and so we added Dulcolax Shortness of breath overall seems improved He is pleased that his weight has come down objectively based on the scale  Objective + exam Vitals:   12/23/23 0500 12/23/23 0539 12/23/23 0743 12/23/23 0755  BP:  100/61  115/76  Pulse:  (!) 52 (!) 58 61  Resp:  18 16 20   Temp:  (!) 97.5 F (36.4 C)  98.1 F (36.7 C)  TempSrc:  Oral  Oral  SpO2:  92% 92% 95%  Weight: 111 kg     Height:       Filed Weights   12/21/23 0500 12/22/23 0530 12/23/23 0500  Weight: 111.3 kg 112.8 kg 111 kg     Examination:  Postop/injury changes to right thigh Neck soft supple Cannot  appreciate JVD S1-S2 no murmur on monitors seems to be having PVCs with some underlying bradycardia Chest is clear no wheeze no rales He has trace lower extremity edema Neuro is intact Psych is euthymic he is a little bit more conversant and pleasant  Scheduled Meds:  [START ON 01/01/2024] amiodarone  200 mg Oral Daily   amiodarone  400 mg Oral BID    apixaban   5 mg Oral BID   bisacodyl  10 mg Rectal Once   Chlorhexidine  Gluconate Cloth  6 each Topical Daily   citalopram   20 mg Oral Daily   digoxin  0.0625 mg Oral Daily   empagliflozin   10 mg Oral Daily   ezetimibe   10 mg Oral Daily   fluticasone   1 spray Each Nare Daily   fluticasone  furoate-vilanterol  1 puff Inhalation Daily   metoprolol  succinate  25 mg Oral Daily   mexiletine  150 mg Oral Q12H   pantoprazole   20 mg Oral Daily   polyethylene glycol  17 g Oral BID   rosuvastatin   40 mg Oral Daily   senna-docusate  1 tablet Oral BID   spironolactone   25 mg Oral Daily   tamsulosin   0.4 mg Oral Daily   torsemide   40 mg Oral Daily   traZODone   50 mg Oral QHS   Continuous Infusions: acetaminophen , albuterol , ALPRAZolam , alum & mag hydroxide-simeth, artificial tears, glucagon (human recombinant), guaiFENesin, ondansetron  **OR** ondansetron  (ZOFRAN ) IV, mouth rinse, oxyCODONE -acetaminophen  **AND** oxyCODONE , sodium chloride , tiZANidine   Douglas Michiel Sivley, MD  [facility]

## 2023-12-23 NOTE — Progress Notes (Signed)
 Physical Therapy Treatment Patient Details Name: Douglas [name] MRN: [id number] DOB: 04-28-1955 Today's Date: 12/23/2023   History of Present Illness 68 y.o. M admitted 12/12/23 with abdominal pain, N/V. Had VTach and Afib in ED. Pt with decompensated CHF. 10/19 syncope with Vtach causing fall. 10/22 L/RHC. PMHx: obesity, T2DM, HTN, HLD, Afib on Eliquis , CAD s/p CABG, ICM, CHF, AICD, anemia, asthma, opiate dependence.    PT Comments  Pt pleasant, talkative, a bit tangential with topics but very willing to mobilize. Pt declined need for AD and was again able to walk in hall without AD, perform stairs, complete repeated transfers and HEP. Pt mobilizing well with sPO2 89-93% on RA with all activity with recovery >90% with standing rest and cues for breathing technique. Pt stating agreement of HHPT recommendation and reports concern over continued structural stability issues at home (reports hole in floor of bathroom). Will follow acutely.     If plan is discharge home, recommend the following: Assistance with cooking/housework;Assist for transportation   Can travel by private vehicle        Equipment Recommendations  None recommended by PT    Recommendations for Other Services       Precautions / Restrictions Precautions Precautions: Fall     Mobility  Bed Mobility               General bed mobility comments: in bathroom on arrival and chair end of session    Transfers Overall transfer level: Modified independent                 General transfer comment: pt able to rise from toilet without assist and performed 10 repeated sit to stands from recliner in 31 sec with reliance on UB use (indicating increased fall risk)    Ambulation/Gait Ambulation/Gait assistance: Supervision Gait Distance (Feet): 500 Feet Assistive device: None Gait Pattern/deviations: Step-through pattern, Decreased stride length, Wide base of support   Gait velocity interpretation: 1.31 - 2.62  ft/sec, indicative of limited community ambulator   General Gait Details: pt with increased lateral sway with out use of AD this session and functional gait speed. SPO2 89-93% on RA during gait   Stairs Stairs: Yes Stairs assistance: Supervision Stair Management: Alternating pattern, Forwards, Two rails Number of Stairs: 11 General stair comments: pt ascended and descended stairs with use of rail without assist   Wheelchair Mobility     Tilt Bed    Modified Rankin (Stroke Patients Only)       Balance Overall balance assessment: Mild deficits observed, not formally tested Sitting-balance support: No upper extremity supported, Feet supported Sitting balance-Leahy Scale: Good     Standing balance support: No upper extremity supported, During functional activity Standing balance-Leahy Scale: Good                              Communication Communication Communication: Impaired Factors Affecting Communication: Hearing impaired  Cognition Arousal: Alert Behavior During Therapy: WFL for tasks assessed/performed   PT - Cognitive impairments: No apparent impairments                         Following commands: Intact      Cueing Cueing Techniques: Verbal cues, Gestural cues  Exercises General Exercises - Lower Extremity Long Arc Quad: AROM, Both, Seated, 20 reps, Strengthening Hip Flexion/Marching: AROM, Both, Seated, Strengthening, 20 reps    General Comments  Pertinent Vitals/Pain Pain Assessment Pain Assessment: No/denies pain    Home Living                          Prior Function            PT Goals (current goals can now be found in the care plan section) Progress towards PT goals: Progressing toward goals    Frequency    Min 2X/week      PT Plan      Co-evaluation              AM-PAC PT 6 Clicks Mobility   Outcome Measure  Help needed turning from your back to your side while in a flat bed  without using bedrails?: None Help needed moving from lying on your back to sitting on the side of a flat bed without using bedrails?: None Help needed moving to and from a bed to a chair (including a wheelchair)?: None Help needed standing up from a chair using your arms (e.g., wheelchair or bedside chair)?: A Little Help needed to walk in hospital room?: A Little Help needed climbing 3-5 steps with a railing? : A Little 6 Click Score: 21    End of Session   Activity Tolerance: Patient tolerated treatment well Patient left: in chair;with call bell/phone within reach Nurse Communication: Mobility status PT Visit Diagnosis: Other abnormalities of gait and mobility (R26.89)     Time: 9152-9088 PT Time Calculation (min) (ACUTE ONLY): 24 min  Charges:    $Gait Training: 8-22 mins $Therapeutic Activity: 8-22 mins PT General Charges $$ ACUTE PT VISIT: 1 Visit                     Douglas [name], PT Acute Rehabilitation Services Office: [phone]    Douglas [name] Douglas [name] 12/23/2023, 11:07 AM

## 2023-12-23 NOTE — Plan of Care (Signed)
  Problem: Education: Goal: Knowledge of General Education information will improve Description: Including pain rating scale, medication(s)/side effects and non-pharmacologic comfort measures 12/23/2023 1746 by Carlin Raspberry, RN Outcome: Progressing 12/23/2023 1746 by Carlin Raspberry, RN Outcome: Progressing   Problem: Health Behavior/Discharge Planning: Goal: Ability to manage health-related needs will improve 12/23/2023 1746 by Carlin Raspberry, RN Outcome: Progressing 12/23/2023 1746 by Carlin Raspberry, RN Outcome: Progressing   Problem: Clinical Measurements: Goal: Ability to maintain clinical measurements within normal limits will improve 12/23/2023 1746 by Carlin Raspberry, RN Outcome: Progressing 12/23/2023 1746 by Carlin Raspberry, RN Outcome: Progressing Goal: Will remain free from infection 12/23/2023 1746 by Carlin Raspberry, RN Outcome: Progressing 12/23/2023 1746 by Carlin Raspberry, RN Outcome: Progressing Goal: Diagnostic test results will improve 12/23/2023 1746 by Carlin Raspberry, RN Outcome: Progressing 12/23/2023 1746 by Carlin Raspberry, RN Outcome: Progressing Goal: Respiratory complications will improve Outcome: Progressing Goal: Cardiovascular complication will be avoided Outcome: Progressing

## 2023-12-24 ENCOUNTER — Other Ambulatory Visit (HOSPITAL_COMMUNITY): Payer: Self-pay

## 2023-12-24 ENCOUNTER — Telehealth: Payer: Self-pay

## 2023-12-24 ENCOUNTER — Other Ambulatory Visit: Payer: Self-pay | Admitting: Physician Assistant

## 2023-12-24 DIAGNOSIS — Z7189 Other specified counseling: Secondary | ICD-10-CM | POA: Diagnosis not present

## 2023-12-24 DIAGNOSIS — Z515 Encounter for palliative care: Secondary | ICD-10-CM | POA: Diagnosis not present

## 2023-12-24 DIAGNOSIS — Z59819 Housing instability, housed unspecified: Secondary | ICD-10-CM

## 2023-12-24 DIAGNOSIS — I509 Heart failure, unspecified: Secondary | ICD-10-CM | POA: Diagnosis not present

## 2023-12-24 DIAGNOSIS — I5022 Chronic systolic (congestive) heart failure: Secondary | ICD-10-CM

## 2023-12-24 DIAGNOSIS — I472 Ventricular tachycardia, unspecified: Secondary | ICD-10-CM | POA: Diagnosis not present

## 2023-12-24 LAB — CBC WITH DIFFERENTIAL/PLATELET
Abs Immature Granulocytes: 0.02 K/uL (ref 0.00–0.07)
Basophils Absolute: 0 K/uL (ref 0.0–0.1)
Basophils Relative: 0 %
Eosinophils Absolute: 0 K/uL (ref 0.0–0.5)
Eosinophils Relative: 0 %
HCT: 46 % (ref 39.0–52.0)
Hemoglobin: 13.9 g/dL (ref 13.0–17.0)
Immature Granulocytes: 0 %
Lymphocytes Relative: 12 %
Lymphs Abs: 1.4 K/uL (ref 0.7–4.0)
MCH: 24.8 pg — ABNORMAL LOW (ref 26.0–34.0)
MCHC: 30.2 g/dL (ref 30.0–36.0)
MCV: 82.1 fL (ref 80.0–100.0)
Monocytes Absolute: 1.4 K/uL — ABNORMAL HIGH (ref 0.1–1.0)
Monocytes Relative: 12 %
Neutro Abs: 8.6 K/uL — ABNORMAL HIGH (ref 1.7–7.7)
Neutrophils Relative %: 76 %
Platelets: 292 K/uL (ref 150–400)
RBC: 5.6 MIL/uL (ref 4.22–5.81)
RDW: 22 % — ABNORMAL HIGH (ref 11.5–15.5)
WBC: 11.5 K/uL — ABNORMAL HIGH (ref 4.0–10.5)
nRBC: 0 % (ref 0.0–0.2)

## 2023-12-24 LAB — COMPREHENSIVE METABOLIC PANEL WITH GFR
ALT: 30 U/L (ref 0–44)
AST: 35 U/L (ref 15–41)
Albumin: 3.7 g/dL (ref 3.5–5.0)
Alkaline Phosphatase: 88 U/L (ref 38–126)
Anion gap: 12 (ref 5–15)
BUN: 39 mg/dL — ABNORMAL HIGH (ref 8–23)
CO2: 33 mmol/L — ABNORMAL HIGH (ref 22–32)
Calcium: 9.3 mg/dL (ref 8.9–10.3)
Chloride: 87 mmol/L — ABNORMAL LOW (ref 98–111)
Creatinine, Ser: 1.63 mg/dL — ABNORMAL HIGH (ref 0.61–1.24)
GFR, Estimated: 46 mL/min — ABNORMAL LOW (ref >60)
Glucose, Bld: 137 mg/dL — ABNORMAL HIGH (ref 70–99)
Potassium: 3.4 mmol/L — ABNORMAL LOW (ref 3.5–5.1)
Sodium: 132 mmol/L — ABNORMAL LOW (ref 135–145)
Total Bilirubin: 1.3 mg/dL — ABNORMAL HIGH (ref 0.0–1.2)
Total Protein: 8.3 g/dL — ABNORMAL HIGH (ref 6.5–8.1)

## 2023-12-24 LAB — COOXEMETRY PANEL
Carboxyhemoglobin: 2 % — ABNORMAL HIGH (ref 0.5–1.5)
Methemoglobin: 1.2 % (ref 0.0–1.5)
O2 Saturation: 51 %
Total hemoglobin: 14.2 g/dL (ref 12.0–16.0)

## 2023-12-24 LAB — MAGNESIUM: Magnesium: 2.4 mg/dL (ref 1.7–2.4)

## 2023-12-24 LAB — DIGOXIN LEVEL: Digoxin Level: <0.6 ng/mL — ABNORMAL LOW (ref 0.8–2.0)

## 2023-12-24 MED ORDER — AMIODARONE HCL 200 MG PO TABS
200.0000 mg | ORAL_TABLET | Freq: Every day | ORAL | 0 refills | Status: DC
  Filled 2023-12-24: qty 58, 37d supply, fill #0

## 2023-12-24 MED ORDER — TORSEMIDE 20 MG PO TABS
40.0000 mg | ORAL_TABLET | Freq: Every day | ORAL | 3 refills | Status: DC
  Filled 2023-12-24: qty 60, 30d supply, fill #0

## 2023-12-24 MED ORDER — POTASSIUM CHLORIDE CRYS ER 20 MEQ PO TBCR
40.0000 meq | EXTENDED_RELEASE_TABLET | Freq: Once | ORAL | Status: AC
  Administered 2023-12-24: 40 meq via ORAL
  Filled 2023-12-24: qty 2

## 2023-12-24 MED ORDER — AMIODARONE HCL 400 MG PO TABS
400.0000 mg | ORAL_TABLET | Freq: Two times a day (BID) | ORAL | 0 refills | Status: DC
  Filled 2023-12-24: qty 14, 7d supply, fill #0

## 2023-12-24 MED ORDER — METOPROLOL SUCCINATE ER 25 MG PO TB24
25.0000 mg | ORAL_TABLET | Freq: Every day | ORAL | 2 refills | Status: AC
  Filled 2023-12-24: qty 30, 30d supply, fill #0

## 2023-12-24 MED ORDER — SPIRONOLACTONE 25 MG PO TABS
25.0000 mg | ORAL_TABLET | Freq: Every day | ORAL | 3 refills | Status: AC
  Filled 2023-12-24: qty 30, 30d supply, fill #0

## 2023-12-24 MED ORDER — DIGOXIN 62.5 MCG PO TABS
0.0625 mg | ORAL_TABLET | Freq: Every day | ORAL | 3 refills | Status: AC
  Filled 2023-12-24: qty 30, 30d supply, fill #0

## 2023-12-24 MED ORDER — MEXILETINE HCL 150 MG PO CAPS
150.0000 mg | ORAL_CAPSULE | Freq: Two times a day (BID) | ORAL | 2 refills | Status: AC
  Filled 2023-12-24: qty 60, 30d supply, fill #0

## 2023-12-24 MED ORDER — TAMSULOSIN HCL 0.4 MG PO CAPS
0.4000 mg | ORAL_CAPSULE | Freq: Every day | ORAL | 3 refills | Status: AC
  Filled 2023-12-24: qty 30, 30d supply, fill #0

## 2023-12-24 NOTE — Telephone Encounter (Signed)
 Copied from CRM [id number]. Topic: General - Other >> Dec 24, 2023 11:21 AM Hadassah PARAS wrote: Reason for CRM: Currently in hospital and they decided on releasing pt to home. Pt is unstable to go and live home alone (no running water, heat). Carrie from social services is calling to request special assistance due to pt having heart failure, falls, home is unstable.  [facility]: pt is over income from special assistance. Looking for assisted home visit or something to help pt with this situation. Hey have called various placements, but has not successfully locate and have pt admitted.

## 2023-12-24 NOTE — Telephone Encounter (Signed)
 [facility] is aware the patient transferred his care

## 2023-12-24 NOTE — Progress Notes (Addendum)
 Patient ID: Douglas [name], male   DOB: 11-01-1955, 68 y.o.   MRN: [id number]     Cardiologist: Danelle Birmingham, MD   Chief Complaint: CHF  Subjective:   10/19 patient was ambulating to the bathroom with his walker. RN witnessed him fall face first and lose consciousness. CT head okay. On tele review: VT treated with ATP 10/22 R/LHC: patent bypass grafts, severe native disease, severe BiV failure with severely reduced CO w/ elevated filling pressures.  Co-ox lower today at 51%.  BMP not collected yet  I/Os nor wt charted   Sitting up eating breakfast. CVP line disconnected from CVL. CVP charted 8-7 overnight.   Had rough night last night w/ constipation and LBP. Finally had BM after suppository.   Feeling better today. Denies resting dyspnea.    Objective:   Weight Range: 111 kg Body mass index is 36.14 kg/m.   Vital Signs:   Temp:  [97.8 F (36.6 C)-98.8 F (37.1 C)] 97.8 F (36.6 C) (10/28 0448) Pulse Rate:  [52-67] 52 (10/28 0448) Resp:  [16-20] 17 (10/28 0448) BP: (112-132)/(65-84) 129/65 (10/28 0448) SpO2:  [90 %-95 %] 94 % (10/28 0448) Last BM Date : 12/20/23  Weight change: Filed Weights   12/21/23 0500 12/22/23 0530 12/23/23 0500  Weight: 111.3 kg 112.8 kg 111 kg    Intake/Output:   Intake/Output Summary (Last 24 hours) at 12/24/2023 0734 Last data filed at 12/23/2023 1738 Gross per 24 hour  Intake 717 ml  Output --  Net 717 ml      Physical Exam   GENERAL: chronically ill appearing, NAD Lungs- clear  CARDIAC:  JVP 9 cm          Normal rate with regular rhythm. No MRG. No LEE ABDOMEN: obese, oft, non-tender, non-distended.  EXTREMITIES: Warm and well perfused.  NEUROLOGIC: No obvious FND    Telemetry   AF 60s, occasionally PVCs (Personally reviewed)    Labs    CBC Recent Labs    12/22/23 0545 12/23/23 0902  WBC 7.3 7.1  NEUTROABS 4.9 4.7  HGB 13.8 13.9  HCT 45.3 46.2  MCV 80.9 81.8  PLT 317 304   Basic Metabolic Panel Recent  Labs    12/22/23 0545 12/23/23 0902  NA 133* 132*  K 4.3 3.7  CL 86* 86*  CO2 34* 33*  GLUCOSE 106* 134*  BUN 41* 37*  CREATININE 1.63* 1.63*  CALCIUM  9.4 9.3  MG 2.5* 2.5*   Liver Function Tests Recent Labs    12/22/23 0545 12/23/23 0902  AST 44* 40  ALT 32 31  ALKPHOS 91 91  BILITOT 1.3* 1.0  PROT 7.9 8.2*  ALBUMIN 3.5 3.6    No results for input(s): LIPASE, AMYLASE in the last 72 hours.  Cardiac Enzymes No results for input(s): CKTOTAL, CKMB, CKMBINDEX, TROPONINI in the last 72 hours.  BNP: BNP (last 3 results) Recent Labs    04/23/23 1840 05/28/23 0322 05/29/23 0413  BNP 253.0* 557.0* 672.0*    ProBNP (last 3 results) Recent Labs    12/12/23 1234  PROBNP 5,740.0*     D-Dimer No results for input(s): DDIMER in the last 72 hours. Hemoglobin A1C No results for input(s): HGBA1C in the last 72 hours. Fasting Lipid Panel No results for input(s): CHOL, HDL, LDLCALC, TRIG, CHOLHDL, LDLDIRECT in the last 72 hours. Thyroid Function Tests No results for input(s): TSH, T4TOTAL, T3FREE, THYROIDAB in the last 72 hours.  Invalid input(s): FREET3  Other results:  Imaging  No  results found.   Medications:   Scheduled Medications:  [START ON 01/01/2024] amiodarone  200 mg Oral Daily   amiodarone  400 mg Oral BID   apixaban   5 mg Oral BID   Chlorhexidine  Gluconate Cloth  6 each Topical Daily   citalopram   20 mg Oral Daily   digoxin  0.0625 mg Oral Daily   empagliflozin   10 mg Oral Daily   ezetimibe   10 mg Oral Daily   fluticasone   1 spray Each Nare Daily   fluticasone  furoate-vilanterol  1 puff Inhalation Daily   metoprolol  succinate  25 mg Oral Daily   mexiletine  150 mg Oral Q12H   pantoprazole   20 mg Oral Daily   polyethylene glycol  17 g Oral BID   rosuvastatin   40 mg Oral Daily   senna-docusate  1 tablet Oral BID   spironolactone   25 mg Oral Daily   tamsulosin   0.4 mg Oral Daily   torsemide   40 mg Oral  Daily   traZODone   50 mg Oral QHS    Infusions:     PRN Medications: acetaminophen , albuterol , ALPRAZolam , alum & mag hydroxide-simeth, artificial tears, glucagon (human recombinant), guaiFENesin, ondansetron  **OR** ondansetron  (ZOFRAN ) IV, mouth rinse, oxyCODONE -acetaminophen  **AND** oxyCODONE , sodium chloride , tiZANidine  Assessment/Plan   1. Acute on chronic systolic CHF: Ischemic cardiomyopathy.  Medtronic ICD.  He has been on minimal GDMT at home, apparently limited by low BP.  Patient has a long-standing cardiomyopathy, echoes with EF in the 20-25% range since at least 2015.  By Dr. Rolan review of echo this admission, EF 25% with moderate RV dysfunction and dilated IVC. R/LHC 10/22: Patent bypass grafts, severe BiV failure with elevated filling pressures, Fick CI 1.6, PAPi 2.1 - He has end-stage HF. He is not a good candidate for advanced therapies, he has been minimally ambulatory at home. Palliative Care team now on board. Will continue medical management.   - Co-ox lower today at 51%, will try to avoid milrinone with VT  - Back on PO diuretics, Torsemide  40 mg daily. Volume ok on exam  - Cr 1.33>1.49>1.63>pending.  - Continue spiro 25 mg daily - Continue metoprolol  xl 25 mg daily (would not increase any higher with low-output HF) - Continue jardiance  10 mg daily - Continue digoxin 0.0625. Dig level pending    2. VT: Patient has permanent AF but was noted to have a run of VT this admission treated with ATP (dual tachycardia).  Seen by EP. He was stated on amiodarone, now po.  Trigger for VT may have been drastic drop recently in Toprol  XL dose 200 mg daily => 12.5 daily.   - Another run of VT 10/19 w/ syncopal event, looks like treated with ATP (initiated with PMVT) - Previously on 200 mg metoprolol  xl daily, decreased to 25 mg daily this admit (would not increase any further with low-output HF - No recurrent VT. Continue amiodarone 400 mg BID per EP, and mexiletine 150 BID.  3.  Atrial fibrillation: Permanent.  He is currently rate-controlled.  - Continue apixaban  - Toprol  XL as above  4. AKI: Creatinine jumped 1.09 => 1.86 => 2.02 with elevated K.  He received Lokelma.  Foley placed, 500 cc urine out and abdominal pain resolved.  Foley now removed.  - Now on Torsemide , UOP not charted. Volume ok on exam  - Now on Flomax  for suspected BPH.  - Scr now 1.3 > 1.49> ?? Pending.    5. CAD: H/o CABG 2002.  Last cath in 2013  with occluded LAD and RCA, 2 occluded distal OMs.  No chest pain.  Troponin minimally elevated with no trend, suspect demand ischemia.  - Grafts are LIMA - LAD, SVG -Diag, SVG - OM2 -OM3. Patent on cath 12/18/23. - No aspirin  with need for anticoagulation - Continue Zetia  and Crestor   - awaiting BMP. If stable, plan d/c home today w/ HH services.    Length of Stay: [address], [place]  12/24/2023 7:34 AM  Patient seen with PA.  I formulated the plan and agree with the above note.   He is not hooked up to CVP today.  Co-ox lower at 51%.    Denies dyspnea, reports constipation.   General: NAD Neck: No JVD, no thyromegaly or thyroid nodule.  Lungs: Clear to auscultation bilaterally with normal respiratory effort. CV: Nondisplaced PMI.  Heart irregular S1/S2, no S3/S4, no murmur.  No peripheral edema.   Abdomen: Soft, nontender, no hepatosplenomegaly, no distention.  Skin: Intact without lesions or rashes.  Neurologic: Alert and oriented x 3.  Psych: Normal affect. Extremities: No clubbing or cyanosis.  HEENT: Normal.   He does not look volume overloaded at this time.  Will aim for home on torsemide  40 mg daily as long as creatinine is stable.  Still waiting for BMET this morning.    Low cardiac output by RHC.  He is not a good candidate for advanced therapies, he has been minimally ambulatory at home.  Co-ox 51% today.  We are avoiding milrinone with VT.   Continue digoxin, Jardiance , and spironolactone  25 mg daily at current  doses.  I will hold off on further GDMT with rise in creatinine, 1.63 yesterday.  Waiting for BMET today.    No further VT.  Would not increase Toprol  XL with low output HF.  Continue amiodarone and mexiletine.    Chronic AF, on apixaban .    Plan now is home with home health.  If creatinine stable, ok for today.  He will need followup in CHF clinic.  Meds for discharge: amiodarone 400 mg bid x 1 week then 200 mg bid x 1 week then 200 mg daily, mexiletine 150 bid, digoxin 0.0625 daily, Jardiance  10 daily, spironolactone  25 daily, toprol  XL 25 daily, apixaban  5 bid, Zetia  10, Crestor  40, torsemide  40 daily.   Ezra Shuck 12/24/2023 8:57 AM

## 2023-12-24 NOTE — TOC Progression Note (Addendum)
 Transition of Care [facility]) - Progression Note    Patient Details  Name: Douglas [name] MRN: [id number] Date of Birth: [birth year]  Transition of Care [facility]) CM/SW Contact  Justina Delcia Czar, RN Phone Number: [phone] 12/24/2023, 3:34 PM  Clinical Narrative:    Spoke to pt and states he cannot go home without heat or running water. States he does not have finances to have well cleaned. States his neighbor, [name] assist with finances. Per APS Katie, pt has $500 and cost is $1000 to have repaired. Information provided to care team on today.   Attempted call to Shelda Laws with [facility] APS, [phone] ext. [phone] and Izetta Charles CSW, left message for return call. Need to follow up on referral for ALF.   Received call back from APS CSW, Bridgette, and Katie. They are sending referrals to [facility] homes and ALF. [facility] not able to accept patient. Will follow up with Inpatient CSW tomorrow with updates on possible ALF/Family Care Homes.   Updated attending on delay in discharge.   Expected Discharge Plan: Skilled Nursing Facility Barriers to Discharge: No Home Care Agency will accept this patient     Expected Discharge Plan and Services   Discharge Planning Services: CM Consult Post Acute Care Choice: Home Health Living arrangements for the past 2 months: Mobile Home Expected Discharge Date: 12/24/23                         HH Arranged: PT, RN           Social Drivers of Health (SDOH) Interventions SDOH Screenings   Food Insecurity: No Food Insecurity (12/13/2023)  Housing: Low Risk  (12/13/2023)  Transportation Needs: No Transportation Needs (12/13/2023)  Utilities: Not At Risk (12/13/2023)  Depression (PHQ2-9): Medium Risk (11/14/2023)  Social Connections: Moderately Isolated (12/13/2023)  Tobacco Use: Low Risk  (12/13/2023)    Readmission Risk Interventions    05/31/2023   11:35 AM 05/30/2023   10:20 AM  Readmission Risk Prevention Plan   Transportation Screening Complete Complete  Home Care Screening Complete Complete  Medication Review (RN CM) Complete Complete

## 2023-12-24 NOTE — Progress Notes (Signed)
 Palliative:  HPI: 68 y.o. male  with past medical history of permanent a-fib, chronic HFrEF, ischemic cardiomyopathy, status post [employer] ICD, type 2 diabetes, HTN, and chronic back pain with opioid dependence who presented to the ED on 12/12/2023 with multiple complaints, including nausea and abdominal pain.  He was admitted with acute hypoxic respiratory failure, new ventricular tachycardia, and acute on chronic CHF. Palliative Medicine has been consulted for goals of care discussions and complex medical decision making.   I reviewed records and palliative consultation note from 10/22 when last seen by palliative care provider. Palliative chaplain has been following and assisted with completion of Advance Directive - appreciate assistance.   I attempted to meet with Mr. Ashlock x 2 but both times he was requesting assistance to go in restroom.   I returned for 3rd attempt and was able to have conversation with Mr. Durney. Mr. Ivins had many complaints about his care and health as well as generally being hospitalized. I acknowledged his frustrations and how difficult it is to be in the hospital and to be ill. I attempted to address code status and goals of care but it was difficult to keep Mr. Dinovo focused on conversation even with redirection. I shared my understanding that he was considering his wishes and what we should do if he were to decline further. He shares with me that he wishes for all measures to be taken to try and keep him alive. I shared concern with resuscitation efforts in the setting of end stage heart failure leads to poor outcome and suffering. Mr. Thang talks about his sister and decisions she has made for her husband that he does not agree with. At this juncture he is eager to go to the restroom and RN assisted him to restroom.   Completed directive reviewed with [facility]CP[facility] clarified as his friends [name] and [place]. He does indicate on directive that he would NOT want life  prolonging measures in setting of terminal condition expected to result in death within a short period of time, ongoing unconscious state, and advance dementia not expected to improve. He also indicated he would not desire artificial nutrition/hydration during these times either. I am not sure he has good understanding (or is accepting) or his advanced heart disease. Needs ongoing conversations.   Exam: Awake, alert. Tangential speech. No distress. Breathing regular, unlabored. Abd soft, obese. BLE edema.   Plan: - Full code, full scope but needs further conversation - I will follow while hospitalized - Referral to outpatient palliative care recommended  50 min  Bernarda Kitty, NP Palliative Medicine Team Pager [phone] (Please see [email or web address] for schedule) Team Phone [phone]

## 2023-12-24 NOTE — Care Management Important Message (Signed)
 Important Message  Patient Details  Name: Douglas [name] MRN: [id number] Date of Birth: 1956-02-22   Important Message Given:  Yes - Medicare IM     [name] 12/24/2023, 12:13 PM

## 2023-12-24 NOTE — Plan of Care (Signed)

## 2023-12-24 NOTE — Telephone Encounter (Signed)
 Copied from CRM #[id number]. Topic: Clinical Concerns - Medical Question >> Dec 24, 2023  2:53 PM Tamela L wrote: Koren with [facility] is calling other request    Include all details related to the request(s) below: [facility] is calling stating they will be seeing patient for Palliative care.    Confirm and type the Best Contact Number below:  Patient/caller contact number:  Palliative Care [phone]           [] Home  [] Mobile  [x] Work [] Other   [] Okay to leave a voicemail   Medication List:  Current Outpatient Medications:  .  albuterol  HFA (PROVENTIL  HFA;VENTOLIN  HFA;PROAIR  HFA) 90 mcg/actuation inhaler, Inhale 2 puffs every 6 (six) hours as needed for wheezing., Disp: 3 each, Rfl: 0 .  ALPRAZolam  (XANAX ) 1 mg tablet, TAKE 1 TO 2 TABLETS BY MOUTH TWICE DAILY AS NEEDED, Disp: 120 tablet, Rfl: 0 .  apixaban  (Eliquis ) 5 mg tab, Take 1 tablet (5 mg total) by mouth 2 (two) times a day Indications: treatment to prevent blood clots in chronic atrial fibrillation., Disp: 180 tablet, Rfl: 0 .  artificial tears,hypromellose, (ISOPTO) 0.5 % drop, Administer 1 drop into both eyes 4 (four) times a day as needed (dry eye)., Disp: 45 mL, Rfl: 0 .  cephALEXin  (KEFLEX ) 500 mg capsule, Take 500 mg by mouth., Disp: , Rfl:  .  citalopram  (CeleXA ) 20 mg tablet, Take 1 tablet (20 mg total) by mouth daily., Disp: 90 tablet, Rfl: 3 .  doxycycline  (VIBRA -TABS) 100 mg tablet, Take 100 mg by mouth every 12 (twelve) hours., Disp: , Rfl:  .  empagliflozin  (Jardiance ) 10 mg tab, Take 1 tablet (10 mg total) by mouth daily., Disp: 90 tablet, Rfl: 0 .  ezetimibe  (ZETIA ) 10 mg tablet, Take 1 tablet (10 mg total) by mouth daily., Disp: 90 tablet, Rfl: 3 .  fluticasone  propion-salmeteroL (Wixela Inhub) 250-50 mcg/dose diskus inhaler, Inhale 1 puff in the morning and 1 puff before bedtime., Disp: 180 each, Rfl: 0 .  fluticasone  propionate (FLONASE ) 50 mcg/spray nasal spray, PLACE ONE 1-2 SPRAYS INTO EACH NOSTRIL DAILY,  Disp: 16 g, Rfl: 3 .  lansoprazole (PREVACID) 30 mg delayed-release capsule, Take 1 capsule (30 mg total) by mouth daily., Disp: 90 capsule, Rfl: 0 .  loratadine  (Allergy Relief, loratadine ,) 10 mg tablet, TAKE ONE TABLET (10MG  TOTAL) BY MOUTH DAILY, Disp: 90 tablet, Rfl: 1 .  MEN'S MULTI-VITAMIN ORAL, Take 1 tablet by mouth daily. Centrum Silver Mens 50+, Disp: , Rfl:  .  metoprolol  succinate (TOPROL  XL) 25 mg 24 hr tablet, Take 0.5 tablets (12.5 mg total) by mouth daily., Disp: 45 tablet, Rfl: 0 .  naloxone 1 mg/mL injection, For suspected opioid overdose, spray 1 mL in each nostril.  Repeat after 3 minutes if no or minimal response., Disp: 4 mL, Rfl: 1 .  omega 3-dha-epa-fish oil (OMEGA 3) 1,000 mg capsule, Take 1 g by mouth Once Daily., Disp: , Rfl:  .  oxyCODONE -acetaminophen  (PERCOCET) 10-325 mg per tablet, Take 1 tablet by mouth every 4 (four) hours as needed for moderate pain (4-6) or severe pain (7-10)., Disp: 180 tablet, Rfl: 0 .  rosuvastatin  (CRESTOR ) 40 mg tablet, Take 1 tablet (40 mg total) by mouth daily., Disp: 90 tablet, Rfl: 0 .  tiZANidine  (ZANAFLEX ) 4 mg tablet, Take 1 tablet (4 mg total) by mouth every 12 (twelve) hours., Disp: 180 tablet, Rfl: 0 .  torsemide  (DEMADEX ) 20 mg tablet, Take 1 tablet (20 mg total) by mouth daily., Disp: 90 tablet, Rfl: 0 .  UNABLE TO FIND, Take 1 each by mouth daily. Med Name: Balance of Lysle, Disp: , Rfl:      Medication Request/Refills: Pharmacy Information (if applicable)   [] Not Applicable       []  Pharmacy listed  Send Medication Request to:                                                 [] Pharmacy not listed (added to pharmacy list in Epic) Send Medication Request to:      Listed Pharmacies: [facility] - [place], [place] - [address] - PHONE: [phone] - FAX: [phone] [facility] [phone] - [place], [place] - [address] AT [facility] [address] - PHONE: [phone] - FAX:  [phone]

## 2023-12-24 NOTE — TOC Transition Note (Addendum)
 Transition of Care [facility]) - Discharge Note   Patient Details  Name: Douglas [name] MRN: [id number] Date of Birth: 10-26-1955  [facility] CM/SW Contact:  Justina Delcia Czar, RN Phone Number: [phone] 12/24/2023, 11:18 AM   Clinical Narrative:     Spoke to pt and states he cannot go home to trailer because the trailer has a hole in the floor in bathroom and no heat, but electricity. Has no running water, has a well that is not functioning. Pt needs oxygen at home. [facility].  Patient is declining SNF placement at accepted facility when he was SNF appropriate. PT/OT recommending HH, as pt ambulation and functioning has improved daily.   Patient has space heaters provided to him from the At Risk Program .  Received call from Shelda Laws with [facility], [phone]. [phone] and Izetta Charles CSW, Patient is apart of the At Risk Program. They take him grocery shopping and visit once per week.  States the cost to repair well is $1000 which pt expressed to them he wasn't able to pay. They states he is not using RW and had multiple falls prior to hospitalization.   Shelda Laws, will call ALF to see if they can accept pt. Will need FL2.   Final next level of care: Home w Home Health Services Barriers to Discharge: No Home Care Agency will accept this patient   Patient Goals and CMS Choice Patient states their goals for this hospitalization and ongoing recovery are:: wants to get better CMS Medicare.gov Compare Post Acute Care list provided to:: Patient Choice offered to / list presented to : Patient      Discharge Placement                       Discharge Plan and Services Additional resources added to the After Visit Summary for     Discharge Planning Services: CM Consult Post Acute Care Choice: Home Health                    HH Arranged: PT, RN          Social Drivers of Health (SDOH) Interventions SDOH Screenings    Food Insecurity: No Food Insecurity (12/13/2023)  Housing: Low Risk  (12/13/2023)  Transportation Needs: No Transportation Needs (12/13/2023)  Utilities: Not At Risk (12/13/2023)  Depression (PHQ2-9): Medium Risk (11/14/2023)  Social Connections: Moderately Isolated (12/13/2023)  Tobacco Use: Low Risk  (12/13/2023)     Readmission Risk Interventions    05/31/2023   11:35 AM 05/30/2023   10:20 AM  Readmission Risk Prevention Plan  Transportation Screening Complete Complete  Home Care Screening Complete Complete  Medication Review (RN CM) Complete Complete

## 2023-12-24 NOTE — Progress Notes (Signed)
 SATURATION QUALIFICATIONS: (This note is used to comply with regulatory documentation for home oxygen)  Patient Saturations on Room Air at Rest = 93%  Patient Saturations on Room Air while Ambulating = 94%  Pt does not appear to require supplemental oxygen while at rest or during ambulation. O2 saturation never fell below 90% on room air.

## 2023-12-24 NOTE — Discharge Summary (Signed)
 Physician Discharge Summary  Douglas [name] FMW:[id number] DOB: 11-29-1955 DOA: 12/12/2023  PCP: Douglas Pfeiffer, PA-C  Admit date: 12/12/2023 Discharge date: 12/24/2023  Time spent: 60 minutes  Recommendations for Outpatient Follow-up:  Outpatient home health PT OT as well as palliative care to see patient givenLikely end-stage heart failure  all GDMT meds ordered as per below after discussion with cardiology-Will be difficult to keep out of  in the hospital given high risk of readmission and input sought has not patient continue discussions that were initiated on 10/22 Will require Chem-12 CBC magnesium  in 1 week either at PCP or cardiology office-TOC visit needed Desat screen to be done and then discuss the same regarding oxygen at home  Discharge Diagnoses:  MAIN problem for hospitalization   VT arrest Acute exacerbation 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 tach requiring electrical cardioversion-PPM placed subsequently Permanent A-fib CHADVASC >4 previously on Eliquis  Chronic pain opioid dependence   Chronology  10/17 presented to [facility] nausea vomiting urgency hypoxic volume overload BNP 5716 DVT treated with atrial tachycardic pacing and converted to A-fib started on Amio drip             EP consulted for 2 minutes of VT 10/20 fell face first-CODE BLUE called became responsive on his own--- mexiletine was added 10/22 heart cath performed 100% RCA, 100% mid CX, 100% proximal LAD to mid  LAD stenosis severe native three-vessel disease continued patency of 4 bypasses severe biventricular failure with decreased severe cardiac output-felt by cardiology to add any meds he is end-stage              Not a candidate for heart failure team for inotropes given VT history 10/22  palliative care was consulted   He currently is awaiting skilled facility placement    Pertinent imaging/studies till date  Echocardiogram 10/17--30 to 35% mitral valve normal in structure similar wall motion abnormalities with worse function   Assessment  & Plan :      Cardiac Acute exacerbation of HFmrEF current EF 30-35% Cardiology to schedule close outpatient appointment  Diuretics, Jardiance , Aldactone  dosing to be adjusted in the outpatient setting Permanent A-fib with PPM in place CHADVASC >4 Recalcitrant-amiodarone 400 until 11/5 and then 200 dosing Cannot increase BB 2/2 biventricular failure per heart failure team Continues mexiletine 150 every 12 per EP instructions, continue digoxin 0.0625---- adjust meds at next office visit Eliquis  5 twice daily CABG 2002 Aspirin  has been discontinued given need for anticoagulation Renal Worsening azotemia during hospital stay improved with adjustment of diuretics as below Acute respiratory failure with hypoxia Still on oxygen Will require 2 L of oxygen at discharge Endocrine BMI 36 grade 2 obesity  continues Jardiance  10 as above Psych chronic pain Continue Xanax  1 twice daily as needed anxiety, Celexa  20 daily, trazodone  50 at bedtime He understands that no pain meds or controlled substance will be prescribed and he needs to follow with his primary Disposition Likely skilled when available  Discharge Exam: Vitals:   12/24/23 0448 12/24/23 9263  BP: 129/65 132/81  Pulse: (!) 52   Resp: 17 18  Temp: 97.8 F (36.6 C) 97.9 F (36.6 C)  SpO2: 94% 95%    Subj on day of d/c   No issues overnight-no pain no fever Good effective laxative  yesterday he is doing okay today and sitting in chair  General Exam on discharge  Postop changes to right eye No JVD Chest is clear no wheeze Abdomen is soft no rebound no guarding Trace lower extremity edema  Discharge Instructions   Discharge Instructions     (HEART FAILURE PATIENTS) Call MD:  Anytime you have any of the following symptoms: 1) 3 pound weight gain in 24 hours or 5 pounds in 1 week 2) shortness of breath, with or without a dry hacking cough 3) swelling in the hands, feet or stomach 4) if you have to sleep on extra pillows at night in order to breathe.   Complete by: As directed    AMB referral to CHF clinic   Complete by: As directed    Reason for referral: Pulmonary HTN   Amb Referral to Palliative Care   Complete by: As directed    Avoid straining   Complete by: As directed    Diet - low sodium heart healthy   Complete by: As directed    Discharge instructions   Complete by: As directed    Look at your meds carefully several have changed you will need to follow-up with cardiology as well as with primary care discharge and probably discuss goals of care going forward-you have a relatively incurable heart issue but we can probably prevent you from coming into the hospital as long as you keep close follow-ups It may be time at some point talk about goals of care as an outpatient It would be a good idea to get a referral to palliative care to have this discussion I will make that referral Continue your controlled substances and other meds through your primary physician We will get home health RN PT as well as an aide to come out to your home and help you   Face-to-face encounter (required for Medicare/Medicaid patients)   Complete by: As directed    I Douglas [name] certify that this patient is under my care and that I, or a nurse practitioner or physician's assistant working with me, had a face-to-face encounter that meets the physician face-to-face encounter  requirements with th is patient on 12/24/2023. The encounter with the patient was in whole, or in part for the following medical condition(s) which is the primary reason for home health care (List medical condition):    Advanced heart failure with poor options   The encounter with the patient was in whole, or in part, for the following medical condition, which is the primary reason for home health care: Advanced heart failure with high risk of readmission-also needs palliative input   I certify that, based on my findings, the following services are medically necessary home health services:  Nursing Physical therapy     Reason for Medically Necessary Home Health Services: Skilled Nursing- Change/Decline in Patient Status   My clinical findings support the need for the above services: Shortness of breath with activity   Further, I certify that my clinical findings support that this patient is homebound due to: Ambulates short distances less than 300 feet   Heart Failure patients record your daily weight using the same scale at the same time of day   Complete by: As  directed    Home Health   Complete by: As directed    To provide the following care/treatments:  PT OT Home Health Aide     Increase activity slowly   Complete by: As directed    STOP any activity that causes chest pain, shortness of breath, dizziness, sweating, or exessive weakness   Complete by: As directed       Allergies as of 12/24/2023       Reactions   Xarelto  [rivaroxaban ] Other (See Comments)   Bleeding ulcers   Codeine Hives, Itching   Lasix  [furosemide ] Other (See Comments)   Patient says he gets light headed and dizzy.    Vioxx [rofecoxib] Palpitations, Other (See Comments)   Caused massive heart attack        Medication List     STOP taking these medications    loratadine  10 MG tablet Commonly known as: CLARITIN        TAKE these medications    albuterol  108 (90 Base) MCG/ACT  inhaler Commonly known as: VENTOLIN  HFA Inhale 2 puffs into the lungs every 6 (six) hours as needed for wheezing or shortness of breath.   ALPRAZolam  1 MG tablet Commonly known as: XANAX  Take 1 tablet (1 mg total) by mouth at bedtime as needed for anxiety. What changed: when to take this   amiodarone 400 MG tablet Commonly known as: PACERONE Take 1 tablet (400 mg total) by mouth 2 (two) times daily for 7 days.   amiodarone 200 MG tablet Commonly known as: PACERONE Take 2 tablets (400 mg total) by mouth 2 (two) times daily for 7 days.; then, take 1 tablet (200 mg total) by mouth daily. Start taking on: January 01, 2024   ARTIFICIAL TEARS OP Place 1 drop into both eyes 3 (three) times daily.   citalopram  20 MG tablet Commonly known as: CELEXA  Take 20 mg by mouth daily.   Digoxin 62.5 MCG Tabs Take 1 tablet (0.0625mg  total) by mouth daily.   Eliquis  5 MG Tabs tablet Generic drug: apixaban  TAKE ONE TABLET BY MOUTH TWICE DAILY   ezetimibe  10 MG tablet Commonly known as: ZETIA  Take 10 mg by mouth daily.   Fish Oil 1200 MG Caps Take 1,200 mg by mouth daily.   fluticasone  50 MCG/ACT nasal spray Commonly known as: FLONASE  Place 1 spray into both nostrils daily.   fluticasone -salmeterol 250-50 MCG/ACT Aepb Commonly known as: ADVAIR Inhale 1 puff into the lungs in the morning and at bedtime.   Jardiance  10 MG Tabs tablet Generic drug: empagliflozin  Take 10 mg by mouth daily.   lansoprazole 30 MG capsule Commonly known as: PREVACID Take 30 mg by mouth daily.   metoprolol  succinate 25 MG 24 hr tablet Commonly known as: TOPROL -XL Take 1 tablet (25 mg total) by mouth daily. What changed: how much to take   mexiletine 150 MG capsule Commonly known as: MEXITIL Take 1 capsule (150 mg total) by mouth every 12 (twelve) hours.   oxyCODONE -acetaminophen  10-325 MG tablet Commonly known as: PERCOCET Take 1 tablet by mouth every 6 (six) hours as needed for pain (use  medication sparingly for severe pain only).   rosuvastatin  40 MG tablet Commonly known as: CRESTOR  TAKE ONE (1) TABLET EACH DAY   spironolactone  25 MG tablet Commonly known as: ALDACTONE  Take 1 tablet (25 mg total) by mouth daily.   tamsulosin  0.4 MG Caps capsule Commonly known as: FLOMAX  Take 1 capsule (0.4 mg total) by mouth daily.   tiZANidine  4 MG tablet Commonly  known as: ZANAFLEX  Take 4 mg by mouth every 8 (eight) hours as needed for muscle spasms.   Torsemide  40 MG Tabs Take 40 mg by mouth daily. What changed:  medication strength how much to take       Allergies  Allergen Reactions   Xarelto  [Rivaroxaban ] Other (See Comments)    Bleeding ulcers   Codeine Hives and Itching   Lasix  [Furosemide ] Other (See Comments)    Patient says he gets light headed and dizzy.    Vioxx [Rofecoxib] Palpitations and Other (See Comments)    Caused massive heart attack    Follow-up Information     [facility] Follow up on 12/30/2023.   Specialty: Cardiology Why: Advanced Heart Failure Clinic 2:30 PM Entrance C, Free Valet parking Contact information: [address] [place]  [phone]                 The results of significant diagnostics from this hospitalization (including imaging, microbiology, ancillary and laboratory) are listed below for reference.    Significant Diagnostic Studies: CARDIAC CATHETERIZATION Result Date: 12/18/2023   Prox RCA lesion is 100% stenosed.   Mid Cx lesion is 100% stenosed.   Prox LAD to Mid LAD lesion is 100% stenosed.   Mid LM lesion is 40% stenosed.   Prox Graft lesion is 30% stenosed.   Dist Graft lesion is 20% stenosed.   Origin to Prox Graft lesion is 30% stenosed. Findings: Ao = 99/65 (78) LV = 96/21 RA =  18 RV = 61/17 PA = 63/25 (48) PCW = 24 (v = 34) Fick cardiac output/index = 3.8/1.6 PVR = 6.4 WU Ao sat = 92% PA sat = 45%, 44% PAPi = 2.1 Assessment: 1.  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 Continues patency of all 4 bypass grafts 3. Severe biventricular failure with severely decreased cardiac output with elevated filling pressures Plan/Discussion: He is end-stage. Will continue diuresis. I am reluctant to add inotropes in setting of recent VT. Palliative Care consulted. Toribio Fuel, MD 10:46 AM   CT HEAD WO CONTRAST ( ) Result Date: 12/16/2023 EXAM: CT HEAD WITHOUT CONTRAST 12/15/2023 11:53:00 PM TECHNIQUE: CT of the head was performed without the administration of intravenous contrast. Automated exposure control, iterative reconstruction, and/or weight based adjustment of the mA/kV was utilized to reduce the radiation dose to as low as reasonably achievable. COMPARISON: Comparison made with prior CT from 04/23/2023. CLINICAL HISTORY: Syncope/presyncope, cerebrovascular cause suspected; on DOAC. FINDINGS: BRAIN AND VENTRICLES: No acute hemorrhage. No evidence of acute infarct. Mild chronic microvascular ischemic disease, stable. No hydrocephalus. No extra-axial collection. No mass effect or midline shift. Mild calcified atherosclerosis present about the skull base. ORBITS: Chronic changes at the right globe noted. SINUSES: Mild mucosal thickening about the ethmoid air cells. Paranasal sinuses are otherwise clear. SOFT TISSUES AND SKULL: No acute soft tissue abnormality. No skull fracture. IMPRESSION: 1. No acute intracranial abnormality. 2. Mild chronic microvascular ischemic disease, stable. Electronically signed by: Morene Hoard MD 12/16/2023 12:18 AM EDT RP Workstation: [id number]   DG CHEST PORT 1 VIEW Result Date: 12/13/2023 EXAM: 1 VIEW(S) XRAY OF THE CHEST 12/13/2023 04:16:00 PM COMPARISON: 12/12/2023 CLINICAL HISTORY: Encounter for central line placement [id number] FINDINGS: LINES, TUBES AND DEVICES: Left IJ central venous catheter in place with tip in proximal SVC. Stable right AICD in place. LUNGS AND PLEURA: Central pulmonary  vascular congestion. No focal pulmonary opacity. No pleural effusion. No pneumothorax. HEART AND MEDIASTINUM:  Aortic atherosclerosis noted. No acute abnormality of the cardiac and mediastinal silhouettes. BONES AND SOFT TISSUES: Sternotomy wires and CABG markers noted. No acute osseous abnormality. IMPRESSION: 1. Left IJ central venous catheter with tip in the proximal SVC. No pneumothorax. 2. Central pulmonary vascular congestion. Electronically signed by: Norman Gatlin MD 12/13/2023 04:52 PM EDT RP Workstation: [id number]   US  EKG SITE RITE Result Date: 12/13/2023 If Site Rite image not attached, placement could not be confirmed due to current cardiac rhythm.  ECHOCARDIOGRAM COMPLETE Result Date: 12/13/2023    ECHOCARDIOGRAM REPORT   Patient Name:   Douglas [name] Date of Exam: 12/13/2023 Medical Rec #:  [id number]        Height:       69.0 in Accession #:    [id number]       Weight:       244.5 lb Date of Birth:  20-Mar-1955        BSA:          2.250 m Patient Age:    67 years         BP:           94/78 mmHg Patient Gender: M                HR:           116 bpm. Exam Location:  Inpatient Procedure: 2D Echo, Color Doppler, Cardiac Doppler and Intracardiac            Opacification Agent (Both Spectral and Color Flow Doppler were            utilized during procedure). Indications:    Ventricular Tachycardia I47.2  History:        Patient has prior history of Echocardiogram examinations, most                 recent 06/04/2023. CHF and Cardiomyopathy, CAD, Defibrillator and                 Pacemaker, Arrythmias:Atrial Fibrillation and Tachycardia; Risk                 Factors:Dyslipidemia and Hypertension.  Sonographer:    Koleen Popper RDCS Referring Phys: [id number] EJIROGHENE E EMOKPAE IMPRESSIONS  1. Left ventricular ejection fraction, by estimation, is 30 to 35%. The left ventricle has moderately decreased function. The left ventricle demonstrates regional wall motion abnormalities (see scoring  diagram/findings for description). The left ventricular internal cavity size was moderately to severely dilated. Left ventricular diastolic parameters are indeterminate.  2. Right ventricular systolic function was not well visualized. The right ventricular size is not well visualized. There is normal pulmonary artery systolic pressure.  3. Left atrial size was mildly dilated.  4. The mitral valve is normal in structure. Mild mitral valve regurgitation. No evidence of mitral stenosis.  5. The aortic valve is tricuspid. Aortic valve regurgitation is not visualized. No aortic stenosis is present.  6. The inferior vena cava is dilated in size with <50% respiratory variability, suggesting right atrial pressure of 15 mmHg. Comparison(s): A prior study was performed on 05/28/2023. Prior images reviewed side by side. The ejection fraction was 35-40%. On personal review there were similar wall motion abnormalities at that time with worse overall function today. The LV cavity is more dilated today (5.6 cm-> 6.4 cm at end diastole, 4.5 cm -> 5.7 cm at end systole). There was moderate biatrial enlargement. Conclusion(s)/Recommendation(s): Cannot exclude a small apical thrombus. Consider a cardiac CT or MRI  if clinically indicated. FINDINGS  Left Ventricle: Left ventricular ejection fraction, by estimation, is 30 to 35%. The left ventricle has moderately decreased function. The left ventricle demonstrates regional wall motion abnormalities. Definity  contrast agent was given IV to delineate the left ventricular endocardial borders. The left ventricular internal cavity size was moderately to severely dilated. There is no left ventricular hypertrophy. Left ventricular diastolic parameters are indeterminate.  LV Wall Scoring: The entire anterior wall, entire anterior septum, and entire apex are akinetic. Right Ventricle: The right ventricular size is not well visualized. Right vetricular wall thickness was not well visualized. Right  ventricular systolic function was not well visualized. There is normal pulmonary artery systolic pressure. The tricuspid regurgitant velocity is 2.21 m/s, and with an assumed right atrial pressure of 15 mmHg, the estimated right ventricular systolic pressure is 34.5 mmHg. Left Atrium: Left atrial size was mildly dilated. Right Atrium: Right atrial size was normal in size. Pericardium: There is no evidence of pericardial effusion. Mitral Valve: The mitral valve is normal in structure. Mild mitral valve regurgitation. No evidence of mitral valve stenosis. Tricuspid Valve: The tricuspid valve is normal in structure. Tricuspid valve regurgitation is trivial. No evidence of tricuspid stenosis. Aortic Valve: The aortic valve is tricuspid. Aortic valve regurgitation is not visualized. No aortic stenosis is present. Pulmonic Valve: The pulmonic valve was normal in structure. Pulmonic valve regurgitation is trivial. No evidence of pulmonic stenosis. Aorta: The aortic root and ascending aorta are structurally normal, with no evidence of dilitation. Venous: The inferior vena cava is dilated in size with less than 50% respiratory variability, suggesting right atrial pressure of 15 mmHg. IAS/Shunts: No atrial level shunt detected by color flow Doppler. Additional Comments: A device lead is visualized.  LEFT VENTRICLE PLAX 2D LVIDd:         6.40 cm LVIDs:         5.70 cm LV PW:         1.00 cm LV IVS:        1.00 cm LVOT diam:     2.00 cm LV SV:         26 LV SV Index:   11 LVOT Area:     3.14 cm  IVC IVC diam: 2.80 cm LEFT ATRIUM             Index LA diam:        5.50 cm 2.44 cm/m LA Vol (A2C):   66.3 ml 29.47 ml/m LA Vol (A4C):   53.9 ml 23.95 ml/m LA Biplane Vol: 61.3 ml 27.24 ml/m  AORTIC VALVE LVOT Vmax:   69.80 cm/s LVOT Vmean:  44.500 cm/s LVOT VTI:    0.082 m  AORTA Ao Root diam: 2.90 cm Ao Asc diam:  3.40 cm MR Peak grad: 74.6 mmHg   TRICUSPID VALVE MR Vmax:      432.00 cm/s TR Peak grad:   19.5 mmHg                            TR Vmax:        221.00 cm/s                            SHUNTS                           Systemic VTI:  0.08 m  Systemic Diam: 2.00 cm Emeline Calender Electronically signed by Emeline Calender Signature Date/Time: 12/13/2023/2:15:37 PM    Final    DG Chest 2 View Result Date: 12/12/2023 CLINICAL DATA:  Shortness of breath. Nausea and vomiting for the past 2 days. EXAM: CHEST - 2 VIEW COMPARISON:  05/29/2023 FINDINGS: Stable enlarged cardiac silhouette, post CABG changes and right subclavian pacer and AICD leads. Clear lungs with normal vascularity. Tortuous and partially calcified thoracic aorta. Thoracolumbar spine degenerative changes. IMPRESSION: 1. No acute abnormality. 2. Stable cardiomegaly. Electronically Signed   By: Elspeth Bathe M.D.   On: 12/12/2023 13:54   CT ABDOMEN PELVIS WO CONTRAST Result Date: 12/12/2023 CLINICAL DATA:  Nausea and decreased urination. EXAM: CT ABDOMEN AND PELVIS WITHOUT CONTRAST TECHNIQUE: Multidetector CT imaging of the abdomen and pelvis was performed following the standard protocol without IV contrast. RADIATION DOSE REDUCTION: This exam was performed according to the departmental dose-optimization program which includes automated exposure control, adjustment of the mA and/or kV according to patient size and/or use of iterative reconstruction technique. COMPARISON:  CT pelvis dated 05/30/2023 FINDINGS: Evaluation of this exam is limited in the absence of intravenous contrast. Lower chest: The visualized lung bases are clear. Pacemaker wires noted. No intra-abdominal free air or free fluid. Hepatobiliary: There is mild irregularity of the liver contour most consistent with changes of cirrhosis. Clinical correlation recommended. No biliary ductal dilatation. The gallbladder is unremarkable. Pancreas: Unremarkable. No pancreatic ductal dilatation or surrounding inflammatory changes. Spleen: Normal in size without focal abnormality. Adrenals/Urinary  Tract: The adrenal glands unremarkable. There is no hydronephrosis or nephrolithiasis on either side. The visualized ureters and urinary bladder appear unremarkable. Stomach/Bowel: Postsurgical changes of bowel with anastomotic staple line in the rectum. There is sigmoid diverticulosis. There is no bowel obstruction or active inflammation. The appendix is normal. Vascular/Lymphatic: Moderate aortoiliac atherosclerotic disease. The IVC is unremarkable. No portal venous gas. There is no adenopathy. Reproductive: The prostate and seminal vesicles are grossly remarkable. Other: None Musculoskeletal: Degenerative changes of the spine. No acute osseous pathology. IMPRESSION: 1. No acute intra-abdominal or pelvic pathology. No hydronephrosis or nephrolithiasis. 2. Sigmoid diverticulosis. No bowel obstruction. Normal appendix. 3.  Aortic Atherosclerosis (ICD10-I70.0). Electronically Signed   By: Vanetta Chou M.D.   On: 12/12/2023 13:23    Microbiology: No results found for this or any previous visit (from the past 240 hours).   Labs: Basic Metabolic Panel: Recent Labs  Lab 12/19/23 0241 12/20/23 0028 12/20/23 0430 12/21/23 0520 12/22/23 0545 12/23/23 0902  NA 133*  --  135 130* 133* 132*  K 4.4  --  4.1 3.6 4.3 3.7  CL 94*  --  93* 88* 86* 86*  CO2 29  --  31 31 34* 33*  GLUCOSE 106*  --  94 97 106* 134*  BUN 43*  --  35* 34* 41* 37*  CREATININE 1.40*  --  1.33* 1.49* 1.63* 1.63*  CALCIUM  8.8*  --  9.1 8.6* 9.4 9.3  MG 2.3 2.0 2.0 2.0 2.5* 2.5*  PHOS  --   --  4.2  --   --   --    Liver Function Tests: Recent Labs  Lab 12/20/23 0430 12/21/23 0520 12/22/23 0545 12/23/23 0902  AST 29 38 44* 40  ALT 20 26 32 31  ALKPHOS 73 74 91 91  BILITOT 1.4* 1.3* 1.3* 1.0  PROT 7.1 7.2 7.9 8.2*  ALBUMIN 3.1* 3.2* 3.5 3.6   No results for input(s): LIPASE, AMYLASE in the last 168 hours.  No results for input(s): AMMONIA in the last 168 hours. CBC: Recent Labs  Lab 12/18/23 0251  12/18/23 0931 12/18/23 0939 12/19/23 0241 12/20/23 0028 12/22/23 0545 12/23/23 0902  WBC 6.9  --   --  7.5 6.5 7.3 7.1  NEUTROABS  --   --   --   --   --  4.9 4.7  HGB 12.7*   < > 10.9* 12.1* 11.9* 13.8 13.9  HCT 42.4   < > 32.0* 40.3 39.8 45.3 46.2  MCV 82.3  --   --  81.7 81.9 80.9 81.8  PLT 243  --   --  233 239 317 304   < > = values in this interval not displayed.   Cardiac Enzymes: No results for input(s): CKTOTAL, CKMB, CKMBINDEX, TROPONINI in the last 168 hours. BNP: BNP (last 3 results) Recent Labs    04/23/23 1840 05/28/23 0322 05/29/23 0413  BNP 253.0* 557.0* 672.0*    ProBNP (last 3 results) Recent Labs    12/12/23 1234  PROBNP 5,740.0*    CBG: No results for input(s): GLUCAP in the last 168 hours.  Signed:  [name] MD   [facility] 12/24/2023, 9:09 AM

## 2023-12-25 ENCOUNTER — Telehealth: Payer: Self-pay

## 2023-12-25 DIAGNOSIS — I472 Ventricular tachycardia, unspecified: Secondary | ICD-10-CM | POA: Diagnosis not present

## 2023-12-25 LAB — MAGNESIUM: Magnesium: 2.6 mg/dL — ABNORMAL HIGH (ref 1.7–2.4)

## 2023-12-25 LAB — BASIC METABOLIC PANEL WITH GFR
Anion gap: 10 (ref 5–15)
BUN: 36 mg/dL — ABNORMAL HIGH (ref 8–23)
CO2: 32 mmol/L (ref 22–32)
Calcium: 9.2 mg/dL (ref 8.9–10.3)
Chloride: 89 mmol/L — ABNORMAL LOW (ref 98–111)
Creatinine, Ser: 1.55 mg/dL — ABNORMAL HIGH (ref 0.61–1.24)
GFR, Estimated: 49 mL/min — ABNORMAL LOW (ref >60)
Glucose, Bld: 140 mg/dL — ABNORMAL HIGH (ref 70–99)
Potassium: 4.1 mmol/L (ref 3.5–5.1)
Sodium: 131 mmol/L — ABNORMAL LOW (ref 135–145)

## 2023-12-25 LAB — COOXEMETRY PANEL
Carboxyhemoglobin: 2.2 % — ABNORMAL HIGH (ref 0.5–1.5)
Methemoglobin: <0.7 % (ref 0.0–1.5)
O2 Saturation: 61.7 %
Total hemoglobin: 14 g/dL (ref 12.0–16.0)

## 2023-12-25 NOTE — Progress Notes (Addendum)
 Patient ID: Douglas [name], male   DOB: 09-14-1955, 68 y.o.   MRN: [id number]     Cardiologist: Danelle Birmingham, MD   Chief Complaint: CHF  Subjective:   10/19 patient was ambulating to the bathroom with his walker. RN witnessed him fall face first and lose consciousness. CT head okay. On tele review: VT treated with ATP 10/22 R/LHC: patent bypass grafts, severe native disease, severe BiV failure with severely reduced CO w/ elevated filling pressures  Breathing stable. No resting dyspnea.   Co-ox 62%  Awaiting BMP    Objective:   Weight Range: 110.2 kg Body mass index is 35.88 kg/m.   Vital Signs:   Temp:  [97.7 F (36.5 C)-98.9 F (37.2 C)] 97.7 F (36.5 C) (10/29 0508) Pulse Rate:  [65-70] 65 (10/28 1950) Resp:  [16-20] 16 (10/29 0746) BP: (88-141)/(64-116) 141/116 (10/29 0508) SpO2:  [95 %-96 %] 96 % (10/29 0746) Weight:  [110.2 kg] 110.2 kg (10/29 0508) Last BM Date : 12/24/23  Weight change: Filed Weights   12/22/23 0530 12/23/23 0500 12/25/23 0508  Weight: 112.8 kg 111 kg 110.2 kg    Intake/Output:   Intake/Output Summary (Last 24 hours) at 12/25/2023 0813 Last data filed at 12/24/2023 1816 Gross per 24 hour  Intake 716 ml  Output --  Net 716 ml      Physical Exam   GENERAL: obese, chronically ill appearing, NAD Lungs-clear  CARDIAC:  JVP not elevated.          Irregularly irregular regular and rate. No LEE  ABDOMEN: Soft, non-tender, non-distended.  EXTREMITIES: Warm and well perfused.  NEUROLOGIC: No obvious FND   Telemetry   AF 60s, occasionally PVCs (Personally reviewed)    Labs    CBC Recent Labs    12/23/23 0902 12/24/23 1147  WBC 7.1 11.5*  NEUTROABS 4.7 8.6*  HGB 13.9 13.9  HCT 46.2 46.0  MCV 81.8 82.1  PLT 304 292   Basic Metabolic Panel Recent Labs    89/72/74 0902 12/24/23 0853 12/25/23 0538  NA 132* 132*  --   K 3.7 3.4*  --   CL 86* 87*  --   CO2 33* 33*  --   GLUCOSE 134* 137*  --   BUN 37* 39*  --    CREATININE 1.63* 1.63*  --   CALCIUM  9.3 9.3  --   MG 2.5* 2.4 2.6*   Liver Function Tests Recent Labs    12/23/23 0902 12/24/23 0853  AST 40 35  ALT 31 30  ALKPHOS 91 88  BILITOT 1.0 1.3*  PROT 8.2* 8.3*  ALBUMIN 3.6 3.7    No results for input(s): LIPASE, AMYLASE in the last 72 hours.  Cardiac Enzymes No results for input(s): CKTOTAL, CKMB, CKMBINDEX, TROPONINI in the last 72 hours.  BNP: BNP (last 3 results) Recent Labs    04/23/23 1840 05/28/23 0322 05/29/23 0413  BNP 253.0* 557.0* 672.0*    ProBNP (last 3 results) Recent Labs    12/12/23 1234  PROBNP 5,740.0*     D-Dimer No results for input(s): DDIMER in the last 72 hours. Hemoglobin A1C No results for input(s): HGBA1C in the last 72 hours. Fasting Lipid Panel No results for input(s): CHOL, HDL, LDLCALC, TRIG, CHOLHDL, LDLDIRECT in the last 72 hours. Thyroid Function Tests No results for input(s): TSH, T4TOTAL, T3FREE, THYROIDAB in the last 72 hours.  Invalid input(s): FREET3  Other results:  Imaging  No results found.   Medications:   Scheduled Medications:  [  START ON 01/01/2024] amiodarone  200 mg Oral Daily   amiodarone  400 mg Oral BID   apixaban   5 mg Oral BID   Chlorhexidine  Gluconate Cloth  6 each Topical Daily   citalopram   20 mg Oral Daily   digoxin  0.0625 mg Oral Daily   empagliflozin   10 mg Oral Daily   ezetimibe   10 mg Oral Daily   fluticasone   1 spray Each Nare Daily   fluticasone  furoate-vilanterol  1 puff Inhalation Daily   metoprolol  succinate  25 mg Oral Daily   mexiletine  150 mg Oral Q12H   pantoprazole   20 mg Oral Daily   polyethylene glycol  17 g Oral BID   rosuvastatin   40 mg Oral Daily   senna-docusate  1 tablet Oral BID   spironolactone   25 mg Oral Daily   tamsulosin   0.4 mg Oral Daily   torsemide   40 mg Oral Daily   traZODone   50 mg Oral QHS    Infusions:     PRN Medications: acetaminophen , albuterol ,  ALPRAZolam , alum & mag hydroxide-simeth, artificial tears, glucagon (human recombinant), guaiFENesin, ondansetron  **OR** ondansetron  (ZOFRAN ) IV, mouth rinse, oxyCODONE -acetaminophen  **AND** oxyCODONE , sodium chloride , tiZANidine  Assessment/Plan   1. Acute on chronic systolic CHF: Ischemic cardiomyopathy.  [employer] ICD.  He has been on minimal GDMT at home, apparently limited by low BP.  Patient has a long-standing cardiomyopathy, echoes with EF in the 20-25% range since at least 2015.  By Dr. Rolan review of echo this admission, EF 25% with moderate RV dysfunction and dilated IVC. R/LHC 10/22: Patent bypass grafts, severe BiV failure with elevated filling pressures, Fick CI 1.6, PAPi 2.1 - He has end-stage HF. He is not a good candidate for advanced therapies, he has been minimally ambulatory at home. Palliative Care team now on board. Will continue medical management.   - Co-ox 62% today  - Back on PO diuretics, Torsemide  40 mg daily. Volume ok on exam  - Cr 1.33>1.49>1.63>1.63 pending.  - Continue spiro 25 mg daily - Continue metoprolol  xl 25 mg daily (would not increase any higher with low-output HF) - Continue jardiance  10 mg daily - Continue digoxin 0.0625. Dig level ok    2. VT: Patient has permanent AF but was noted to have a run of VT this admission treated with ATP (dual tachycardia).  Seen by EP. He was stated on amiodarone, now po.  Trigger for VT may have been drastic drop recently in Toprol  XL dose 200 mg daily => 12.5 daily.   - Another run of VT 10/19 w/ syncopal event, looks like treated with ATP (initiated with PMVT) - Previously on 200 mg metoprolol  xl daily, decreased to 25 mg daily this admit (would not increase any further with low-output HF - No recurrent VT. Continue amiodarone 400 mg BID per EP, and mexiletine 150 BID.  3. Atrial fibrillation: Permanent.  He is currently rate-controlled.  - Continue apixaban  - Toprol  XL as above  4. AKI: Creatinine jumped 1.09 =>  1.86 => 2.02 with elevated K.  He received Lokelma.  Foley placed, 500 cc urine out and abdominal pain resolved.  Foley now removed.  - Now on Flomax  for suspected BPH.  - Scr 1.63 yesterday, repeat BMP pending    5. CAD: H/o CABG 2002.  Last cath in 2013 with occluded LAD and RCA, 2 occluded distal OMs.  No chest pain.  Troponin minimally elevated with no trend, suspect demand ischemia.  - Grafts are LIMA - LAD, SVG -  Diag, SVG - OM2 -OM3. Patent on cath 12/18/23. - No aspirin  with need for anticoagulation - Continue Zetia  and Crestor   6. Dispo - no home care agency will accept pt - SW helping to locate SNF for placement   Plan to continue current med regimen for now. We are awaiting BMP results. Will follow w/ updated med recs If any significant changes in SCr warrant med adjustments.    Length of Stay: [address], [place]  12/25/2023 8:13 AM  Patient seen with PA, I formulated the plan and agree with the above note.   No BMET yet this morning.  Co-ox 62%.  No dyspnea. Weight down 2 more lbs.    General: NAD Neck: No JVD, no thyromegaly or thyroid nodule.  Lungs: Clear to auscultation bilaterally with normal respiratory effort. CV: Nondisplaced PMI.  Heart regular S1/S2, no S3/S4, no murmur.  No peripheral edema.    Abdomen: Soft, nontender, no hepatosplenomegaly, no distention.  Skin: Intact without lesions or rashes.  Neurologic: Alert and oriented x 3.  Psych: Normal affect. Extremities: No clubbing or cyanosis.  HEENT: Normal.   He does not look volume overloaded at this time.  Will aim for home on torsemide  40 mg daily as long as creatinine stays stable.  Still waiting for BMET this morning.    Low cardiac output by RHC.  He is not a good candidate for advanced therapies, he has been minimally ambulatory at home.  Co-ox 62% today.  We are avoiding milrinone with VT.   Continue digoxin, Jardiance , and spironolactone  25 mg daily at current doses.  I will hold off  on further GDMT with rise in creatinine, 1.63 yesterday.  Waiting for BMET today.  If lower, could add losartan 12.5 daily.    No further VT.  Would not increase Toprol  XL with low output HF.  Continue amiodarone and mexiletine.    Chronic AF, on apixaban .    Hopefully to SNF soon. He will need followup in CHF clinic.  Meds for discharge: amiodarone 400 mg bid x 1 week then 200 mg bid x 1 week then 200 mg daily, mexiletine 150 bid, digoxin 0.0625 daily, Jardiance  10 daily, spironolactone  25 daily, toprol  XL 25 daily, apixaban  5 bid, Zetia  10, Crestor  40, torsemide  40 daily.   Cardiology will sign off.   Ezra Shuck 12/25/2023 9:48 AM

## 2023-12-25 NOTE — Progress Notes (Signed)
 Physical Therapy Treatment Patient Details Name: Douglas [name] MRN: [id number] DOB: [birth year] Today's Date: 12/25/2023   History of Present Illness 68 y.o. M admitted 12/12/23 with abdominal pain, N/V. Had VTach and Afib in ED. Pt with decompensated CHF. 10/19 syncope with Vtach causing fall. 10/22 L/RHC. PMHx: obesity, T2DM, HTN, HLD, Afib on Eliquis , CAD s/p CABG, ICM, CHF, AICD, anemia, asthma, opiate dependence.    PT Comments  Pt reports his legs feel a little stiff today. Continues to mobilize in hallway. Pt's home with poor living conditions and TOC looking for alternatives.      If plan is discharge home, recommend the following: Assistance with cooking/housework;Assist for transportation   Can travel by private vehicle        Equipment Recommendations  None recommended by PT    Recommendations for Other Services       Precautions / Restrictions Precautions Precautions: Fall Recall of Precautions/Restrictions: Intact     Mobility  Bed Mobility               General bed mobility comments: Pt up in chair    Transfers Overall transfer level: Modified independent Equipment used: None Transfers: Sit to/from Stand Sit to Stand: Modified independent (Device/Increase time)                Ambulation/Gait Ambulation/Gait assistance: Supervision Gait Distance (Feet): 300 Feet Assistive device: None Gait Pattern/deviations: Step-through pattern, Decreased stride length, Wide base of support Gait velocity: decr Gait velocity interpretation: 1.31 - 2.62 ft/sec, indicative of limited community ambulator   General Gait Details: Incr lateral trunk sway and no loss of balance. Did not use assistive device today.   Stairs             Wheelchair Mobility     Tilt Bed    Modified Rankin (Stroke Patients Only)       Balance Overall balance assessment: Mild deficits observed, not formally tested Sitting-balance support: No upper extremity  supported, Feet supported Sitting balance-Leahy Scale: Good     Standing balance support: No upper extremity supported, During functional activity Standing balance-Leahy Scale: Good                              Communication Communication Communication: Impaired Factors Affecting Communication: Hearing impaired  Cognition Arousal: Alert Behavior During Therapy: WFL for tasks assessed/performed   PT - Cognitive impairments: No apparent impairments                         Following commands: Intact      Cueing Cueing Techniques: Verbal cues, Gestural cues  Exercises      General Comments        Pertinent Vitals/Pain Pain Assessment Pain Assessment: No/denies pain    Home Living                          Prior Function            PT Goals (current goals can now be found in the care plan section) Progress towards PT goals: Progressing toward goals    Frequency    Min 2X/week      PT Plan      Co-evaluation              AM-PAC PT 6 Clicks Mobility   Outcome Measure  Help needed turning from your back to  your side while in a flat bed without using bedrails?: None Help needed moving from lying on your back to sitting on the side of a flat bed without using bedrails?: None Help needed moving to and from a bed to a chair (including a wheelchair)?: None Help needed standing up from a chair using your arms (e.g., wheelchair or bedside chair)?: None Help needed to walk in hospital room?: A Little Help needed climbing 3-5 steps with a railing? : A Little 6 Click Score: 22    End of Session   Activity Tolerance: Patient tolerated treatment well Patient left: in chair;with call bell/phone within reach   PT Visit Diagnosis: Other abnormalities of gait and mobility (R26.89)     Time: 1449-1500 PT Time Calculation (min) (ACUTE ONLY): 11 min  Charges:    $Gait Training: 8-22 mins PT General Charges $$ ACUTE PT  VISIT: 1 Visit                     [facility] PT Acute Rehabilitation Services Office [phone]    [facility]gers ORN [facility] 12/25/2023, 3:12 PM

## 2023-12-25 NOTE — Telephone Encounter (Signed)
 Copied from CRM [id number]. Topic: Clinical - Home Health Verbal Orders >> Dec 24, 2023  3:24 PM Delon DASEN wrote: Caller/Agency: [place] with [facility] Callback Number: [phone] Service Requested: Palliative care Frequency: n/a Any new concerns about the patient? No   [facility] will be providing palliative care for the patient

## 2023-12-25 NOTE — Progress Notes (Signed)
 Central line removed per MD order without difficulty. Patient educated on bedrest for one hour.

## 2023-12-25 NOTE — Progress Notes (Signed)
 Patient is medically stable for discharge, unfortunately has no safe home to go. Social services have been consulted.   BP 102/72 (BP Location: Right Arm)   Pulse 61   Temp 97.6 F (36.4 C) (Oral)   Resp 20   Ht 5' 9 (1.753 m)   Wt 110.2 kg   SpO2 95%   BMI 35.88 kg/m   Neurology awake and alert ENT with mild pallor Cardiovascular with S1 and S2 present and regular Respiratory with no wheezing or rhonchi Abdomen with no distention  No lower extremity edema  Recovered heart failure, pending placement, possible shelter.

## 2023-12-25 NOTE — TOC Progression Note (Addendum)
 Transition of Care [facility]) - Progression Note    Patient Details  Name: Douglas [name] MRN: [id number] Date of Birth: [birth year]  [facility]) CM/SW Contact  [name] Phone Number: [phone] 12/25/2023, 12:07 PM  Clinical Narrative:   12:06 PM- HF CSW called and left a VM with Shelda Laws with [facility], [phone] ext. [phone].   12:11 PM- HF CSW called Izetta Charles with APS and left a VM.   HF CSW received a call from Izetta Charles, who also had her supervisor Deitra present for the call. Izetta stated that the patients neighbor will not allow the patient to stay with them at this time. The APS staff stated that they are going to inquire with their director about funding to fix the patients water needs at his him. Patients home does not have running water. CSW reviewed PTs note and shared recommendations.    HF CSW/CM will continue to follow and monitor for dc readiness.     Expected Discharge Plan: Skilled Nursing Facility Barriers to Discharge: No Home Care Agency will accept this patient               Expected Discharge Plan and Services   Discharge Planning Services: CM Consult Post Acute Care Choice: Home Health Living arrangements for the past 2 months: Mobile Home Expected Discharge Date: 12/24/23                         HH Arranged: PT, RN           Social Drivers of Health (SDOH) Interventions SDOH Screenings   Food Insecurity: No Food Insecurity (12/13/2023)  Housing: Low Risk  (12/13/2023)  Transportation Needs: No Transportation Needs (12/13/2023)  Utilities: Not At Risk (12/13/2023)  Depression (PHQ2-9): Medium Risk (11/14/2023)  Social Connections: Moderately Isolated (12/13/2023)  Tobacco Use: Low Risk  (12/13/2023)    Readmission Risk Interventions    05/31/2023   11:35 AM 05/30/2023   10:20 AM  Readmission Risk Prevention Plan  Transportation Screening Complete Complete  Home Care Screening Complete  Complete  Medication Review (RN CM) Complete Complete

## 2023-12-26 LAB — BASIC METABOLIC PANEL WITH GFR
Anion gap: 13 (ref 5–15)
BUN: 41 mg/dL — ABNORMAL HIGH (ref 8–23)
CO2: 29 mmol/L (ref 22–32)
Calcium: 8.9 mg/dL (ref 8.9–10.3)
Chloride: 90 mmol/L — ABNORMAL LOW (ref 98–111)
Creatinine, Ser: 1.62 mg/dL — ABNORMAL HIGH (ref 0.61–1.24)
GFR, Estimated: 46 mL/min — ABNORMAL LOW (ref >60)
Glucose, Bld: 105 mg/dL — ABNORMAL HIGH (ref 70–99)
Potassium: 3.9 mmol/L (ref 3.5–5.1)
Sodium: 132 mmol/L — ABNORMAL LOW (ref 135–145)

## 2023-12-26 LAB — MAGNESIUM: Magnesium: 2.2 mg/dL (ref 1.7–2.4)

## 2023-12-26 NOTE — TOC Progression Note (Signed)
 Transition of Care [facility]) - Progression Note    Patient Details  Name: Douglas [name] MRN: [id number] Date of Birth: [birth year]  Transition of Care [facility]) CM/SW Contact  Justina Delcia Czar, RN Phone Number: [phone] 12/26/2023, 12:00 PM  Clinical Narrative:    Patient was reviewed by medical team, attending, CSW, CM and Inpatient Supervisor, pt has declined shelter. Attending spoke to neighbor, [name] and they will have plumber come out to home to work on water. Pt will dc home with HH. Contacted [facility] HH rep, Amy to make aware HH CSW was added to orders.   Will taxi ride home tomorrow. Transportation will be arranged at time of dc by [facility].    Expected Discharge Plan: Skilled Nursing Facility Barriers to Discharge: Unsafe home situation   Expected Discharge Plan and Services   Discharge Planning Services: CM Consult Post Acute Care Choice: Home Health Living arrangements for the past 2 months: Mobile Home Expected Discharge Date: 12/24/23                    HH Arranged: RN, PT, OT, Social Work [facility] Agency: [facility] Date [facility] Agency Contacted: 12/26/23 Time HH Agency Contacted: 1200 Representative spoke with at [facility] Agency: Amy   Social Drivers of Health (SDOH) Interventions SDOH Screenings   Food Insecurity: No Food Insecurity (12/13/2023)  Housing: Low Risk  (12/13/2023)  Transportation Needs: No Transportation Needs (12/13/2023)  Utilities: Not At Risk (12/13/2023)  Depression (PHQ2-9): Medium Risk (11/14/2023)  Social Connections: Moderately Isolated (12/13/2023)  Tobacco Use: Low Risk  (12/13/2023)    Readmission Risk Interventions    05/31/2023   11:35 AM 05/30/2023   10:20 AM  Readmission Risk Prevention Plan  Transportation Screening Complete Complete  Home Care Screening Complete Complete  Medication Review (RN CM) Complete Complete

## 2023-12-26 NOTE — Progress Notes (Signed)
 Patient medically stable for discharge but his home with no water, he declines to go to shelter. I called his contact person, Mrs. [name] Lingo, and she will have the plummer work on patient's home today.   BP 115/74 (BP Location: Left Arm)   Pulse 64   Temp 98.5 F (36.9 C) (Oral)   Resp 18   Ht 5' 9 (1.753 m)   Wt 110.2 kg   SpO2 93%   BMI 35.88 kg/m   Neurology awake and alert ENT with no pallor Cardiovascular with S1 and S2 present and regular Respiratory with no rales or wheezing Abdomen with no distention  No lower extremity edema.   Recovered heart failure exacerbation, medically stable for discharge.  Will plan to wait for his home to have water in order to discharge patient home. He is in agreement with this plan.

## 2023-12-27 ENCOUNTER — Telehealth (HOSPITAL_COMMUNITY): Payer: Self-pay

## 2023-12-27 ENCOUNTER — Other Ambulatory Visit (HOSPITAL_COMMUNITY): Payer: Self-pay

## 2023-12-27 LAB — BASIC METABOLIC PANEL WITH GFR
Anion gap: 15 (ref 5–15)
BUN: 41 mg/dL — ABNORMAL HIGH (ref 8–23)
CO2: 26 mmol/L (ref 22–32)
Calcium: 9 mg/dL (ref 8.9–10.3)
Chloride: 89 mmol/L — ABNORMAL LOW (ref 98–111)
Creatinine, Ser: 1.66 mg/dL — ABNORMAL HIGH (ref 0.61–1.24)
GFR, Estimated: 45 mL/min — ABNORMAL LOW (ref >60)
Glucose, Bld: 118 mg/dL — ABNORMAL HIGH (ref 70–99)
Potassium: 3.7 mmol/L (ref 3.5–5.1)
Sodium: 130 mmol/L — ABNORMAL LOW (ref 135–145)

## 2023-12-27 LAB — MAGNESIUM: Magnesium: 2 mg/dL (ref 1.7–2.4)

## 2023-12-27 MED ORDER — OXYCODONE-ACETAMINOPHEN 5-325 MG PO TABS
1.0000 | ORAL_TABLET | ORAL | Status: AC
  Administered 2023-12-27: 1 via ORAL
  Filled 2023-12-27: qty 1

## 2023-12-27 NOTE — TOC Progression Note (Signed)
 Transition of Care [facility]) - Progression Note    Patient Details  Name: Douglas [name] MRN: [id number] Date of Birth: [birth year]  Transition of [facility]) CM/SW Contact  Justina Delcia Czar, RN Phone Number: [phone] 12/27/2023, 1:51 PM  Clinical Narrative:     Spoke to pt and requesting hospital bed. Pt declined 3n1 bedside commode. Gave permission to speak to [name], spoke to Arispe and heat is fixed. Plumber is coming today to work on water.   Contacted [employer] rep, Mitch for hospital bed and contact is [name] to allow for set up.   Expected Discharge Plan: Skilled Nursing Facility Barriers to Discharge: Unsafe home situation    Expected Discharge Plan and Services   Discharge Planning Services: CM Consult Post Acute Care Choice: Home Health Living arrangements for the past 2 months: Mobile Home Expected Discharge Date: 12/24/23                  HH Arranged: RN, PT, OT, Social Work [facility] Agency: [facility] Date [facility] Agency Contacted: 12/26/23 Time HH Agency Contacted: 1200 Representative spoke with at [facility] Agency: Amy   Social Drivers of Health (SDOH) Interventions SDOH Screenings   Food Insecurity: No Food Insecurity (12/13/2023)  Housing: Low Risk  (12/13/2023)  Transportation Needs: No Transportation Needs (12/13/2023)  Utilities: Not At Risk (12/13/2023)  Depression (PHQ2-9): Medium Risk (11/14/2023)  Social Connections: Moderately Isolated (12/13/2023)  Tobacco Use: Low Risk  (12/13/2023)    Readmission Risk Interventions    05/31/2023   11:35 AM 05/30/2023   10:20 AM  Readmission Risk Prevention Plan  Transportation Screening Complete Complete  Home Care Screening Complete Complete  Medication Review (RN CM) Complete Complete

## 2023-12-27 NOTE — Progress Notes (Signed)
 Patient has no chest pain or dyspnea, no PND, orthopnea or lower extremity edema  BP (!) 103/59 (BP Location: Right Arm)   Pulse 60   Temp 98 F (36.7 C) (Oral)   Resp 20   Ht 5' 9 (1.753 m)   Wt 109 kg   SpO2 95%   BMI 35.49 kg/m   Neurology awake and alert ENT with no pallor or icterus Cardiovascular with S1 and S2 present and regular with no gallops  Respiratory with no rales or wheezing, no rhonchi  Abdomen with no distention  No lower extremity edema  Compensated heart failure.  Social services consulted for home equipment including hospital bed.  Today plummer is working in his home, expected to be discharge tomorrow.  All questions have been addressed.

## 2023-12-27 NOTE — Plan of Care (Signed)

## 2023-12-27 NOTE — TOC CM/SW Note (Addendum)
 Patient has a diagnosis of Congestive Heart Failure. Due to this condition, the patient experiences shortness of breath and increased risk of fluid accumulation when lying flat. The patient requires the ability to elevate the head of the bed to help reduce dyspnea and prevent complications associated with lying supine, such as orthopnea and pulmonary congestion. An adjustable bed or hospital bed is medically necessary to achieve and maintain proper positioning.      Durable Medical Equipment  (From admission, onward)           Start     Ordered   12/27/23 1339  For home use only DME Hospital bed  Once       Comments: Therapeutic Mattress  Question Answer Comment  Length of Need Lifetime   Patient has (list medical condition): Heart Failure   The above medical condition requires: Patient requires the ability to reposition frequently   Head must be elevated greater than: 45 degrees   Bed type Semi-electric      12/27/23 1338

## 2023-12-27 NOTE — Telephone Encounter (Signed)
 Called and spoke to pt's neighbor [name] to confirm/remind patient of [facility].   However, patient is currently admitted in to the hospital and [name] will call our office to reschedule. Our office number was provided.

## 2023-12-27 NOTE — TOC Progression Note (Signed)
 Transition of Care [facility]) - Progression Note    Patient Details  Name: Douglas [name] MRN: [id number] Date of Birth: 1955-05-17  Transition of Care [facility]) CM/SW Contact  Sherline Clack, [place] Phone Number: 12/27/2023, 2:16 PM  Clinical Narrative:     CSW spoke with [name], patient's neighbor. [name] informed CSW a plumber would be going to the patient's home today. She also let CSW know there had been multiple repairs done on the patient's house. Provider and nurse made aware. CSW will continue to follow.   Expected Discharge Plan: Home  Barriers to Discharge: Unsafe Housing Situation, Other (plumbing issues)               Expected Discharge Plan and Services   Discharge Planning Services: CM Consult Post Acute Care Choice: Home Health Living arrangements for the past 2 months: Mobile Home Expected Discharge Date: 12/24/23                         HH Arranged: RN, PT, OT, Social Work [facility] Agency: [facility] Date [facility] Agency Contacted: 12/26/23 Time HH Agency Contacted: 1200 Representative spoke with at [facility] Agency: Amy   Social Drivers of Health (SDOH) Interventions SDOH Screenings   Food Insecurity: No Food Insecurity (12/13/2023)  Housing: Low Risk  (12/13/2023)  Transportation Needs: No Transportation Needs (12/13/2023)  Utilities: Not At Risk (12/13/2023)  Depression (PHQ2-9): Medium Risk (11/14/2023)  Social Connections: Moderately Isolated (12/13/2023)  Tobacco Use: Low Risk  (12/13/2023)    Readmission Risk Interventions    05/31/2023   11:35 AM 05/30/2023   10:20 AM  Readmission Risk Prevention Plan  Transportation Screening Complete Complete  Home Care Screening Complete Complete  Medication Review (RN CM) Complete Complete

## 2023-12-28 ENCOUNTER — Other Ambulatory Visit (HOSPITAL_COMMUNITY): Payer: Self-pay

## 2023-12-28 MED ORDER — OXYCODONE HCL 5 MG PO TABS
5.0000 mg | ORAL_TABLET | Freq: Four times a day (QID) | ORAL | Status: DC | PRN
  Administered 2023-12-28: 5 mg via ORAL
  Filled 2023-12-28: qty 1

## 2023-12-28 NOTE — Progress Notes (Signed)
 Patient is feeling well, no chest pain and no dyspnea. His home water has been restored.   BP 121/80 (BP Location: Right Wrist)   Pulse (!) 59   Temp 97.8 F (36.6 C)   Resp 17   Ht 5' 9 (1.753 m)   Wt 109 kg   SpO2 97%   BMI 35.49 kg/m   Neurology awake and alert ENT with no pallor Cardiovascular with S1 and S2 present and regular Respiratory with no wheezing Abdomen with no distention  No lower extremity edema  Compensated heart failure  Patient medically stable for discharge, his home water has been restored.  Plan for discharge today.

## 2023-12-28 NOTE — Plan of Care (Signed)

## 2023-12-28 NOTE — TOC Transition Note (Signed)
 Transition of Care [facility]) - Discharge Note   Patient Details  Name: Douglas [name] MRN: [id number] Date of Birth: April 01, 1955  Transition of Care [facility] CM/SW Contact:  Sherline Clack, LCSWA Phone Number: 12/28/2023, 12:58 PM   Clinical Narrative:     Patient's neighbor, Barnie, called CSW to let her know she is on her way to pick up the patient. Patient has running water and heat in his house. Hospital bed will be delivered to the home later today. Provider and nurse made aware.  Final next level of care: Home w Home Health Services Barriers to Discharge: Unsafe home situation   Patient Goals and CMS Choice Patient states their goals for this hospitalization and ongoing recovery are:: wants to get better CMS Medicare.gov Compare Post Acute Care list provided to:: Patient Choice offered to / list presented to : Patient      Discharge Placement                       Discharge Plan and Services Additional resources added to the After Visit Summary for     Discharge Planning Services: CM Consult Post Acute Care Choice: Home Health                    HH Arranged: RN, PT, OT, Social Work [facility] Agency: [facility] Date [facility] Agency Contacted: 12/26/23 Time HH Agency Contacted: 1200 Representative spoke with at [facility] Agency: Amy  Social Drivers of Health (SDOH) Interventions SDOH Screenings   Food Insecurity: No Food Insecurity (12/13/2023)  Housing: Low Risk  (12/13/2023)  Transportation Needs: No Transportation Needs (12/13/2023)  Utilities: Not At Risk (12/13/2023)  Depression (PHQ2-9): Medium Risk (11/14/2023)  Social Connections: Moderately Isolated (12/13/2023)  Tobacco Use: Low Risk  (12/13/2023)     Readmission Risk Interventions    05/31/2023   11:35 AM 05/30/2023   10:20 AM  Readmission Risk Prevention Plan  Transportation Screening Complete Complete  Home Care Screening Complete Complete  Medication Review (RN CM) Complete Complete

## 2023-12-28 NOTE — Progress Notes (Addendum)
 Physical Therapy Treatment Patient Details Name: Douglas [name] MRN: [id number] DOB: December 22, 1955 Today's Date: 12/28/2023   History of Present Illness 68 y.o. M admitted 12/12/23 with abdominal pain, N/V. Had VTach and Afib in ED. Pt with decompensated CHF. 10/19 syncope with Vtach causing fall. 10/22 L/RHC. PMHx: obesity, T2DM, HTN, HLD, Afib on Eliquis , CAD s/p CABG, ICM, CHF, AICD, anemia, asthma, opiate dependence.    PT Comments  Pt doing well with mobility. Ready for dc home from PT standpoint.     If plan is discharge home, recommend the following: Assistance with cooking/housework;Assist for transportation   Can travel by private vehicle        Equipment Recommendations  None recommended by PT    Recommendations for Other Services       Precautions / Restrictions Precautions Precautions: Fall Recall of Precautions/Restrictions: Intact     Mobility  Bed Mobility               General bed mobility comments: Pt sitting EOB    Transfers Overall transfer level: Modified independent Equipment used: None, Rolling walker (2 wheels) Transfers: Sit to/from Stand Sit to Stand: Modified independent (Device/Increase time)                Ambulation/Gait Ambulation/Gait assistance: Modified independent (Device/Increase time) Gait Distance (Feet): 450 Feet Assistive device: Rolling walker (2 wheels) Gait Pattern/deviations: Step-through pattern, Decreased stride length, Wide base of support Gait velocity: decr Gait velocity interpretation: 1.31 - 2.62 ft/sec, indicative of limited community ambulator   General Gait Details: Steady gait with rolling walker   Stairs             Wheelchair Mobility     Tilt Bed    Modified Rankin (Stroke Patients Only)       Balance Overall balance assessment: Mild deficits observed, not formally tested                                          Communication Communication Communication:  Impaired Factors Affecting Communication: Hearing impaired  Cognition Arousal: Alert Behavior During Therapy: WFL for tasks assessed/performed   PT - Cognitive impairments: No apparent impairments                       PT - Cognition Comments: Tangential conversation Following commands: Intact      Cueing Cueing Techniques: Verbal cues  Exercises      General Comments        Pertinent Vitals/Pain Pain Assessment Pain Assessment: No/denies pain    Home Living                          Prior Function            PT Goals (current goals can now be found in the care plan section) Acute Rehab PT Goals Patient Stated Goal: to go home Progress towards PT goals: Progressing toward goals    Frequency    Min 2X/week      PT Plan      Co-evaluation              AM-PAC PT 6 Clicks Mobility   Outcome Measure  Help needed turning from your back to your side while in a flat bed without using bedrails?: None Help needed moving from lying on your back to sitting  on the side of a flat bed without using bedrails?: None Help needed moving to and from a bed to a chair (including a wheelchair)?: None Help needed standing up from a chair using your arms (e.g., wheelchair or bedside chair)?: None Help needed to walk in hospital room?: None Help needed climbing 3-5 steps with a railing? : A Little 6 Click Score: 23    End of Session   Activity Tolerance: Patient tolerated treatment well Patient left: in bed;with call bell/phone within reach (sitting EOB)   PT Visit Diagnosis: Other abnormalities of gait and mobility (R26.89)     Time: 8946-8885 PT Time Calculation (min) (ACUTE ONLY): 21 min  Charges:    $Gait Training: 8-22 mins PT General Charges $$ ACUTE PT VISIT: 1 Visit                     [facility] PT Acute Rehabilitation Services Office [phone]    Rodgers ORN [facility] 12/28/2023, 1:16 PM

## 2023-12-28 NOTE — Progress Notes (Addendum)
 RN removed IV, discussed discharge paperwork and packed all of pts belongings. Pt to be picked up by [name] and will go by PTO on the way out of the hospital.

## 2023-12-30 ENCOUNTER — Encounter (HOSPITAL_COMMUNITY): Payer: Self-pay

## 2023-12-30 ENCOUNTER — Telehealth: Payer: Self-pay | Admitting: *Deleted

## 2023-12-30 ENCOUNTER — Encounter: Payer: Self-pay | Admitting: Physician Assistant

## 2023-12-30 ENCOUNTER — Ambulatory Visit: Payer: Self-pay | Admitting: Physician Assistant

## 2023-12-30 ENCOUNTER — Ambulatory Visit (HOSPITAL_COMMUNITY)
Admit: 2023-12-30 | Discharge: 2023-12-30 | Disposition: A | Discharge status: Home / Self Care | Source: Ambulatory Visit | Attending: Family Medicine | Admitting: Family Medicine

## 2023-12-30 VITALS — BP 107/64 | HR 71 | Wt 244.0 lb

## 2023-12-30 VITALS — BP 126/70 | HR 70 | Wt 242.8 lb

## 2023-12-30 DIAGNOSIS — I493 Ventricular premature depolarization: Secondary | ICD-10-CM | POA: Diagnosis not present

## 2023-12-30 DIAGNOSIS — F1120 Opioid dependence, uncomplicated: Secondary | ICD-10-CM | POA: Insufficient documentation

## 2023-12-30 DIAGNOSIS — I110 Hypertensive heart disease with heart failure: Secondary | ICD-10-CM | POA: Insufficient documentation

## 2023-12-30 DIAGNOSIS — I480 Paroxysmal atrial fibrillation: Secondary | ICD-10-CM

## 2023-12-30 DIAGNOSIS — I5022 Chronic systolic (congestive) heart failure: Secondary | ICD-10-CM

## 2023-12-30 DIAGNOSIS — I2510 Atherosclerotic heart disease of native coronary artery without angina pectoris: Secondary | ICD-10-CM | POA: Insufficient documentation

## 2023-12-30 DIAGNOSIS — Z79899 Other long term (current) drug therapy: Secondary | ICD-10-CM | POA: Insufficient documentation

## 2023-12-30 DIAGNOSIS — Z955 Presence of coronary angioplasty implant and graft: Secondary | ICD-10-CM | POA: Insufficient documentation

## 2023-12-30 DIAGNOSIS — Z91148 Patient's other noncompliance with medication regimen for other reason: Secondary | ICD-10-CM | POA: Insufficient documentation

## 2023-12-30 DIAGNOSIS — J45909 Unspecified asthma, uncomplicated: Secondary | ICD-10-CM | POA: Insufficient documentation

## 2023-12-30 DIAGNOSIS — G8929 Other chronic pain: Secondary | ICD-10-CM | POA: Diagnosis not present

## 2023-12-30 DIAGNOSIS — I472 Ventricular tachycardia, unspecified: Secondary | ICD-10-CM | POA: Insufficient documentation

## 2023-12-30 DIAGNOSIS — Z7901 Long term (current) use of anticoagulants: Secondary | ICD-10-CM | POA: Insufficient documentation

## 2023-12-30 DIAGNOSIS — Z951 Presence of aortocoronary bypass graft: Secondary | ICD-10-CM | POA: Diagnosis not present

## 2023-12-30 DIAGNOSIS — I4821 Permanent atrial fibrillation: Secondary | ICD-10-CM | POA: Diagnosis not present

## 2023-12-30 DIAGNOSIS — I429 Cardiomyopathy, unspecified: Secondary | ICD-10-CM | POA: Insufficient documentation

## 2023-12-30 DIAGNOSIS — Z7984 Long term (current) use of oral hypoglycemic drugs: Secondary | ICD-10-CM | POA: Diagnosis not present

## 2023-12-30 DIAGNOSIS — Z95810 Presence of automatic (implantable) cardiac defibrillator: Secondary | ICD-10-CM | POA: Diagnosis not present

## 2023-12-30 DIAGNOSIS — K219 Gastro-esophageal reflux disease without esophagitis: Secondary | ICD-10-CM | POA: Insufficient documentation

## 2023-12-30 DIAGNOSIS — F419 Anxiety disorder, unspecified: Secondary | ICD-10-CM | POA: Insufficient documentation

## 2023-12-30 DIAGNOSIS — M545 Low back pain, unspecified: Secondary | ICD-10-CM | POA: Diagnosis not present

## 2023-12-30 DIAGNOSIS — F411 Generalized anxiety disorder: Secondary | ICD-10-CM | POA: Diagnosis not present

## 2023-12-30 DIAGNOSIS — Z7189 Other specified counseling: Secondary | ICD-10-CM

## 2023-12-30 LAB — CBC
HCT: 51.3 % (ref 39.0–52.0)
Hemoglobin: 15.4 g/dL (ref 13.0–17.0)
MCH: 24.9 pg — ABNORMAL LOW (ref 26.0–34.0)
MCHC: 30 g/dL (ref 30.0–36.0)
MCV: 83 fL (ref 80.0–100.0)
Platelets: 263 K/uL (ref 150–400)
RBC: 6.18 MIL/uL — ABNORMAL HIGH (ref 4.22–5.81)
RDW: 22.7 % — ABNORMAL HIGH (ref 11.5–15.5)
WBC: 8.7 K/uL (ref 4.0–10.5)
nRBC: 0 % (ref 0.0–0.2)

## 2023-12-30 LAB — BASIC METABOLIC PANEL WITH GFR
Anion gap: 14 (ref 5–15)
BUN: 27 mg/dL — ABNORMAL HIGH (ref 8–23)
CO2: 25 mmol/L (ref 22–32)
Calcium: 9.1 mg/dL (ref 8.9–10.3)
Chloride: 97 mmol/L — ABNORMAL LOW (ref 98–111)
Creatinine, Ser: 1.4 mg/dL — ABNORMAL HIGH (ref 0.61–1.24)
GFR, Estimated: 55 mL/min — ABNORMAL LOW (ref >60)
Glucose, Bld: 118 mg/dL — ABNORMAL HIGH (ref 70–99)
Potassium: 3.6 mmol/L (ref 3.5–5.1)
Sodium: 136 mmol/L (ref 135–145)

## 2023-12-30 LAB — MAGNESIUM: Magnesium: 2.1 mg/dL (ref 1.7–2.4)

## 2023-12-30 LAB — DIGOXIN LEVEL: Digoxin Level: <0.6 ng/mL — ABNORMAL LOW (ref 0.8–2.0)

## 2023-12-30 LAB — BRAIN NATRIURETIC PEPTIDE: B Natriuretic Peptide: 718.5 pg/mL — ABNORMAL HIGH (ref 0.0–100.0)

## 2023-12-30 MED ORDER — ALPRAZOLAM 1 MG PO TABS: 1.0000 mg | ORAL_TABLET | Freq: Every evening | ORAL | 0 refills | Status: DC | PRN

## 2023-12-30 MED ORDER — LOSARTAN POTASSIUM 25 MG PO TABS: 12.5000 mg | ORAL_TABLET | Freq: Every day | ORAL | 3 refills | Status: AC

## 2023-12-30 MED ORDER — AMIODARONE HCL 200 MG PO TABS: 200.0000 mg | ORAL_TABLET | Freq: Two times a day (BID) | ORAL | 3 refills | Status: AC

## 2023-12-30 MED ORDER — OXYCODONE-ACETAMINOPHEN 10-325 MG PO TABS: 1.0000 | ORAL_TABLET | Freq: Four times a day (QID) | ORAL | 0 refills | Status: DC | PRN

## 2023-12-30 NOTE — Progress Notes (Signed)
 ADVANCED HF CLINIC CONSULT NOTE   Primary Care: [facility], [place], [place] Primary Cardiologist: Danelle Birmingham, MD HF Cardiologist: Dr. Rolan  HPI: 68 y.o. male with a hx of chronic systolic heart failure (EF 10-15% in 05/2017, 35-40% on last echo 4/25), VT s/p ICD placement 07/2017, CAD s/p PCI (1996) and 4v CABG (2002) with Cath [facility] at [facility] 4/4 patent bypass grafts; 40 - 60% stenosis in body of SVG to OM-2 patent LIMA-LAD, SVG-OM2/OM3 with med mgmt), HTN, HLD, permanent atrial fibrillation on apixaban , asthma, anxiety disorder, chronic pain, opioid dependent, GERD. Also w/ h/o poor med compliance.    Admitted 10/25 with a/c HF noted to have an episode of sustained VT treated w/ ATP, converted in Afib w/ low 100s.  Rhytym strip shows rate 131 bpm.  He was started on IV amiodarone and diuresed with IV lasix . Echo showed EF 30-35%, RV not well visualized. On 12/15/23, had VT treated with ATP, resulted in LOC and fall. Mexiletine started. Underwent R/LHC showing patent bypass grafts, severe native disease, severe BiV failure with severely reduced CO w/ elevated filling pressures; Toprol  stopped. Seen by PMT, elected for full code. GDMT titrated and he was discharged home, weight 244 lbs.  Today he returns for post hospital HF follow up with his neighbors. Overall feeling worn out. Uses a WC when outside of his home, uses a walker in his house. Minimal physical activity. Occasional dizziness, no falls since discharge. Denies palpitations, abnormal bleeding, CP, edema, or PND/Orthopnea. Appetite ok. Not weighing at home. Taking all medications, neighbor manages his medications. Eligible for phone visits with Palliative Care, hoping to switch to [place] for palliative services.  ECG (personally reviewed): SR with PVCs  Device interrogation (personally reviewed): 0.2 hr/day activity, no AT/AF,  no further VT 82.5% AS-VS  Labs (10/25): K 3.7, creatinine 1.66  Cardiac Studies - R/LHC 10/25: patent  bypass grafts; RA 18, PA 63/25 (48), PCWP 24 (v=34), CO/CI (Fick) 3.8/1.6, PVR 6.4 WU, PAPi 2.1 - Echo 10/25: EF 30-35%, RV not well visualized - Echo 4/25: EF 35-40%, RV not well visualized.    Past Medical History:  Diagnosis Date   A-fib (HCC)    AICD (automatic cardioverter/defibrillator) present    Anemia    Asthma    CAD (coronary artery disease)    CHF (congestive heart failure) (HCC)    Diabetes mellitus without complication (HCC)    Dyslipidemia    Elevated cholesterol    Hypertension     Current Outpatient Medications  Medication Sig Dispense Refill   albuterol  (VENTOLIN  HFA) 108 (90 Base) MCG/ACT inhaler Inhale 2 puffs into the lungs every 6 (six) hours as needed for wheezing or shortness of breath. 8 g 1   ALPRAZolam  (XANAX ) 1 MG tablet Take 1 tablet (1 mg total) by mouth at bedtime as needed for anxiety. 30 tablet 0   amiodarone (PACERONE) 400 MG tablet Take 1 tablet (400 mg total) by mouth 2 (two) times daily for 7 days. 14 tablet 0   Carboxymethylcellulose Sodium (ARTIFICIAL TEARS OP) Place 1 drop into both eyes 3 (three) times daily.     citalopram  (CELEXA ) 20 MG tablet Take 20 mg by mouth daily.     Digoxin 62.5 MCG TABS Take 1 tablet (0.0625mg  total) by mouth daily. 30 tablet 3   ELIQUIS  5 MG TABS tablet TAKE ONE TABLET BY MOUTH TWICE DAILY 120 tablet 0   ezetimibe  (ZETIA ) 10 MG tablet Take 10 mg by mouth daily.  fluticasone  (FLONASE ) 50 MCG/ACT nasal spray Place 1 spray into both nostrils daily.     fluticasone -salmeterol (ADVAIR) 250-50 MCG/ACT AEPB Inhale 1 puff into the lungs in the morning and at bedtime.     JARDIANCE  10 MG TABS tablet Take 10 mg by mouth daily.     lansoprazole (PREVACID) 30 MG capsule Take 30 mg by mouth daily.       metoprolol  succinate (TOPROL -XL) 25 MG 24 hr tablet Take 1 tablet (25 mg total) by mouth daily. 30 tablet 2   mexiletine (MEXITIL) 150 MG capsule Take 1 capsule (150 mg total) by mouth every 12 (twelve) hours. 60 capsule 2    Omega-3 Fatty Acids (FISH OIL) 1200 MG CAPS Take 1,200 mg by mouth daily.     rosuvastatin  (CRESTOR ) 40 MG tablet TAKE ONE (1) TABLET EACH DAY 90 tablet 0   spironolactone  (ALDACTONE ) 25 MG tablet Take 1 tablet (25 mg total) by mouth daily. 30 tablet 3   tamsulosin  (FLOMAX ) 0.4 MG CAPS capsule Take 1 capsule (0.4 mg total) by mouth daily. 30 capsule 3   tiZANidine  (ZANAFLEX ) 4 MG tablet Take 4 mg by mouth every 8 (eight) hours as needed for muscle spasms.     torsemide  (DEMADEX ) 20 MG tablet Take 2 tablets (40 mg total) by mouth daily. 60 tablet 3   [START ON 01/01/2024] amiodarone (PACERONE) 200 MG tablet Take 2 tablets (400 mg total) by mouth 2 (two) times daily for 7 days, THEN take 1 tablet (200 mg total) by mouth daily. (Patient not taking: Reported on 12/30/2023) 148 tablet 0   oxyCODONE -acetaminophen  (PERCOCET) 10-325 MG tablet Take 1 tablet by mouth every 6 (six) hours as needed for pain (use medication sparingly for severe pain only). (Patient not taking: Reported on 12/30/2023) 30 tablet 0   No current facility-administered medications for this encounter.    Allergies  Allergen Reactions   Xarelto  [Rivaroxaban ] Other (See Comments)    Bleeding ulcers   Codeine Hives and Itching   Lasix  [Furosemide ] Other (See Comments)    Patient says he gets light headed and dizzy.    Vioxx [Rofecoxib] Palpitations and Other (See Comments)    Caused massive heart attack    Social History   Socioeconomic History   Marital status: Divorced    Spouse name: Not on file   Number of children: Not on file   Years of education: Not on file   Highest education level: 10th grade  Occupational History   Occupation: Retired  Tobacco Use   Smoking status: Never   Smokeless tobacco: Never  Vaping Use   Vaping status: Never Used  Substance and Sexual Activity   Alcohol  use: Never   Drug use: Never    Comment: marijuana   Sexual activity: Not on file  Other Topics Concern   Not on file  Social  History Narrative   ** Merged History Encounter **       Social Drivers of Health   Financial Resource Strain: Not on file  Food Insecurity: No Food Insecurity (12/13/2023)   Hunger Vital Sign    Worried About Running Out of Food in the Last Year: Never true    Ran Out of Food in the Last Year: Never true  Transportation Needs: No Transportation Needs (12/13/2023)   PRAPARE - Administrator, Civil Service (Medical): No    Lack of Transportation (Non-Medical): No  Physical Activity: Not on file  Stress: Not on file  Social Connections: Moderately  Isolated (12/13/2023)   Social Connection and Isolation Panel    Frequency of Communication with Friends and Family: Three times a week    Frequency of Social Gatherings with Friends and Family: Once a week    Attends Religious Services: 1 to 4 times per year    Active Member of [employer] or Organizations: No    Attends Banker Meetings: Never    Marital Status: Divorced  Catering Manager Violence: Not At Risk (12/13/2023)   Humiliation, Afraid, Rape, and Kick questionnaire    Fear of Current or Ex-Partner: No    Emotionally Abused: No    Physically Abused: No    Sexually Abused: No   Family History  Problem Relation Age of Onset   Heart attack Father    Wt Readings from Last 3 Encounters:  12/30/23 110.1 kg (242 lb 12.8 oz)  12/30/23 110.7 kg (244 lb)  12/27/23 109 kg (240 lb 4.8 oz)   BP 126/70   Pulse 70   Wt 110.1 kg (242 lb 12.8 oz)   SpO2 93%   BMI 35.86 kg/m   PHYSICAL EXAM: General:  NAD. No resp difficulty, arrived in [facility], elderly and chronically-ill appearing HEENT: Normal Neck: Supple. No JVD. Thick neck Cor: Regular rate & rhythm. No rubs, gallops or murmurs. Lungs: Clear Abdomen: Soft, obese, nontender, nondistended.  Extremities: No cyanosis, clubbing, rash, trace BLE edema Neuro: Alert & oriented x 3, moves all 4 extremities w/o difficulty. Affect pleasant.  ASSESSMENT & PLAN: 1.  Chronic systolic CHF: Ischemic cardiomyopathy.  Medtronic ICD.  He has been on minimal GDMT at home, apparently limited by low BP.  Patient has a long-standing cardiomyopathy, echoes with EF in the 20-25% range since at least 2015.  By Dr. Rolan review of echo this admission, EF 25% with moderate RV dysfunction and dilated IVC. R/LHC 10/22: Patent bypass grafts, severe BiV failure with elevated filling pressures, Fick CI 1.6, PAPi 2.1. He has end-stage HF. He is not a good candidate for advanced therapies, he has been minimally ambulatory at home.Will continue medical management. NYHA IIIb chronically, he is not volume overloaded today.  - Continue torsemide  40 mg daily. BMET and BNP today. - Continue spiro 25 mg daily - Continue Toprol  XL 25 mg daily (would not increase any higher with low-output HF) - Continue Jardiance  10 mg daily - Continue digoxin 0.0625. Check dig level today 2. VT: Patient has permanent AF but was noted to have a run of VT this admission treated with ATP (dual tachycardia).  Seen by EP. He was stated on amiodarone.Trigger for VT may have been drastic drop recently in Toprol  XL dose 200 mg daily => 12.5 daily.   - Another run of VT 10/19 w/ syncopal event, looks like treated with ATP (initiated with PMVT) - Previously on 200 mg metoprolol  xl daily, decreased to 25 mg daily (would not increase any further with low-output HF - No recurrent VT.  - Continue amiodarone 400 mg bid per EP, and mexiletine 150 mg bid. Check Mag. 3. Atrial fibrillation: Permanent.  He is currently rate-controlled. ECG appears SR with frequent PVCs today. - Continue apixaban  5 mg bid. No bleeding issues. CBC today. - Continue Toprol  XL, as above 4. Hx of AKI: Creatinine jumped 1.09 => 1.86 => 2.02 with elevated K.  He received Lokelma.   - Now on Flomax  for suspected BPH.  - Most recent SCr  1.66. BMET toay.  5. CAD: H/o CABG 2002.  Last cath in  2013 with occluded LAD and RCA, 2 occluded distal OMs.  No  chest pain.   - Grafts are LIMA - LAD, SVG -Diag, SVG - OM2 -OM3. Patent on cath 12/18/23. - No aspirin  with need for anticoagulation - Continue Zetia  and Crestor  6. GOC: Lives alone, neighbors help with medications and transportation. Elected for full code status. - Refer to [facility].  Follow up in 3-4 weeks with APP and 3 months with Dr. Rolan + echo  [place], FNP-BC 12/30/23
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
## 2023-12-30 NOTE — Assessment & Plan Note (Addendum)
 Recent hospitalization for hypoxemia, arrhythmia, and heart failure exacerbation. Diagnosed with ventricular tachycardia. Requires close monitoring. No reported symptoms today.  - Continue current medications as prescribed post-hospitalization. - Attend heart failure clinic appointment today. - Attend cardiology appointment on November 13th. - Attend vascular appointment for pacemaker and defibrillator on November 10th. - Order repeat lab work today. - Warning signs and ER precautions discussed.   Need for social work and home health services. Referral to palliative care in [facility] due to current provider limitations. - Referral to social work team for assistance with home health and nursing services. - Referral to palliative care in [facility]

## 2023-12-30 NOTE — Transitions of Care (Post Inpatient/ED Visit) (Signed)
   12/30/2023  Name: Douglas [name] MRN: [id number] DOB: March 07, 1955  Today's TOC FU Call Status: Today's TOC FU Call Status:: Unsuccessful Call (1st Attempt) Unsuccessful Call (1st Attempt) Date: 12/30/23  Attempted to reach the patient regarding the most recent Inpatient/ED visit.  Follow Up Plan: Additional outreach attempts will be made to reach the patient to complete the Transitions of Care (Post Inpatient/ED visit) call.   Mliss Creed [facility], BSN RN Care Manager/ Transition of Care [place]/ [facility] [phone]

## 2023-12-30 NOTE — Patient Instructions (Addendum)
 Thank you for coming in today  If you had labs drawn today, any labs that are abnormal the clinic will call you No news is good news   Medications: 01/01/2024 Decrease amiodarone to 200 mg 1 tablet twice daily  Start Losartan 12.5 mg 1/2 tablet daily    Follow up appointments:  Your physician recommends that you schedule a follow-up appointment in:  3-4 weeks in clinic  3 months With Dr. Rolan with echocardiogram Please call our office to schedule the follow-up appointment in December 2025 for February 2026.  Your physician has requested that you have an echocardiogram. Echocardiography is a painless test that uses sound waves to create images of your heart. It provides your doctor with information about the size and shape of your heart and how well your heart's chambers and valves are working. This procedure takes approximately one hour. There are no restrictions for this procedure.    Do the following things EVERYDAY: Weigh yourself in the morning before breakfast. Write it down and keep it in a log. Take your medicines as prescribed Eat low salt foods--Limit salt (sodium) to 2000 mg per day.  Stay as active as you can everyday Limit all fluids for the day to less than 2 liters   At the [facility], you and your health needs are our priority. As part of our continuing mission to provide you with exceptional heart care, we have created designated Provider Care Teams. These Care Teams include your primary Cardiologist (physician) and Advanced Practice Providers (APPs- Physician Assistants and Nurse Practitioners) who all work together to provide you with the care you need, when you need it.   You may see any of the following providers on your designated Care Team at your next follow up: Dr Toribio Fuel Dr Ezra Rolan Dr. Ria Gardenia Greig Lenetta, NP Caffie Shed, [place] [facility] [place], [place] Beckey Coe, NP Tinnie Redman,  PharmD   Please be sure to bring in all your medications bottles to every appointment.    Thank you for choosing [facility]  If you have any questions or concerns before your next appointment please send us  a message through [place] or call our office at [phone].    TO LEAVE A MESSAGE FOR THE NURSE SELECT OPTION 2, PLEASE LEAVE A MESSAGE INCLUDING: YOUR NAME DATE OF BIRTH CALL BACK NUMBER REASON FOR CALL**this is important as we prioritize the call backs  YOU WILL RECEIVE A CALL BACK THE SAME DAY AS LONG AS YOU CALL BEFORE 4:00 PM
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
## 2023-12-31 ENCOUNTER — Ambulatory Visit (HOSPITAL_COMMUNITY): Payer: Self-pay | Admitting: Family Medicine

## 2023-12-31 ENCOUNTER — Telehealth: Payer: Self-pay

## 2023-12-31 NOTE — Progress Notes (Signed)
 Complex Care Management Note  Care Guide Note 12/31/2023 Name: Douglas [name] MRN: [id number] DOB: Jun 07, 1955  Douglas [name] is a 68 y.o. year old male who sees Grooms, [place] for primary care. I reached out to [name] by phone today to offer complex care management services.  [name] was given information about Complex Care Management services today including:   The Complex Care Management services include support from the care team which includes your Nurse Care Manager, Clinical Social Worker, or Pharmacist.  The Complex Care Management team is here to help remove barriers to the health concerns and goals most important to you. Complex Care Management services are voluntary, and the patient may decline or stop services at any time by request to their care team member.   Complex Care Management Consent Status: Patient agreed to services and verbal consent obtained.   Follow up plan:  Telephone appointment with complex care management team member scheduled for:  [facility] 01/01/2024  Encounter Outcome:  Patient Scheduled  Jeoffrey Buffalo , RMA     [place]  [facility], [facility] Guide  Direct Dial: [phone]  Website: [email or web address]

## 2023-12-31 NOTE — Addendum Note (Signed)
 Encounter addended by: Glena Harlene HERO, FNP on: 12/31/2023 8:23 AM  Actions taken: Clinical Note Signed

## 2023-12-31 NOTE — Addendum Note (Signed)
 Addended by: MANCIL PFEIFFER on: 12/31/2023 12:27 PM   Modules accepted: Level of Service

## 2024-01-01 ENCOUNTER — Other Ambulatory Visit: Admitting: *Deleted

## 2024-01-01 DIAGNOSIS — Z558 Other problems related to education and literacy: Secondary | ICD-10-CM

## 2024-01-01 NOTE — Patient Instructions (Signed)
 Visit Information  Thank you for taking time to visit with me today. Please don't hesitate to contact me if I can be of assistance to you before our next scheduled appointment.  Our next appointment is no further scheduled appointments.   Please call the care guide team at [phone] if you need to cancel or reschedule your appointment.   Please call the Suicide and Crisis Lifeline: 988 call the [place]  National Suicide Prevention Lifeline: (506)820-5096 or TTY: 3301240226 TTY 804-744-4625) to talk to a trained counselor call 1-800-273-TALK (toll free, 24 hour hotline) if you are experiencing a Mental Health or Behavioral Health Crisis or need someone to talk to.  Patient verbalized understanding of Care plan and visit instructions communicated this visit  Rosina Forte, BSN RN [facility], [facility] RN Care Manager Direct Dial: [phone]  Fax: [phone]
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
## 2024-01-02 ENCOUNTER — Telehealth: Payer: Self-pay | Admitting: *Deleted

## 2024-01-02 NOTE — Transitions of Care (Post Inpatient/ED Visit) (Signed)
   01/02/2024  Name: Douglas [name] MRN: [id number] DOB: 08-27-1955  Today's TOC FU Call Status: Today's TOC FU Call Status:: Successful TOC FU Call Completed TOC FU Call Complete Date: 01/02/24 Patient's Name and Date of Birth confirmed.  Transition Care Management Follow-up Telephone Call Date of Discharge: 12/28/23 Discharge Facility: [facility]) Type of Discharge: Inpatient Admission Primary Inpatient Discharge Diagnosis:: Acute hypoxemic respiratory failure How have you been since you were released from the hospital?: Better (eating, drinking well) Any questions or concerns?: No  Items Reviewed: Did you receive and understand the discharge instructions provided?: Yes Medications obtained,verified, and reconciled?: No Medications Not Reviewed Reasons:: Other: (unable to complete a medication reconciliation,- pt could not stay on topic, could not be redirected, med rec completed by  longitunial case manager 11/5 with etheleen Sora, collaborated with Rosina Forte RN) Any new allergies since your discharge?: No Dietary orders reviewed?: Yes Type of Diet Ordered:: heart healthy Do you have support at home?: Yes People in Home [RPT]: alone Name of Support/Comfort Primary Source: neighbor Patient talked non stop during the telephone outreach (did not seem interested in discussing his health, talked mostly about current events, politics, family etc)  could not stay on topic, could not get patient redirected, RN CM was able to complete some of the information and per collaboration with Rosina Forte RN was able to obtain information.  Pt declined TOC 30 day program and declined with Rosina Forte RN, does not want to be followed by longitudinal case manager, is agreeable for social worker to contact him.  Unable to complete SDOH screenings due to could not redirect patient.  Rosina Forte RN completed yesterday 01/01/24 with patient's neighbor Sora.  Medications Reviewed Today:  Unable to review medications today, pt could not stay on topic, could not get pt redirected, per collaboration with Rosina Forte RN who spoke with patient's neighbor Sora yesterday 11/5 and completed medication reconciliation. Medications Reviewed Today   Medications were not reviewed in this encounter     Home Care and Equipment/Supplies: Were Home Health Services Ordered?: Yes Name of Home Health Agency:: [facility] Has Agency set up a time to come to your home?: Yes First Home Health Visit Date: 01/02/24 Any new equipment or medical supplies ordered?: No  Functional Questionnaire: Do you need assistance with bathing/showering or dressing?: No Do you need assistance with meal preparation?: No Do you need assistance with eating?: No Do you have difficulty maintaining continence: No Do you need assistance with getting out of bed/getting out of a chair/moving?: Yes (cane, walker) Do you have difficulty managing or taking your medications?: No  Follow up appointments reviewed: PCP Follow-up appointment confirmed?: No (pt declines for RN CM to schedule post hospital follow up appointment) MD Provider Line [phone] Given: No Follow-up Provider: Charmaine Pontiff [facility] Follow-up appointment confirmed?: Yes Date of Specialist follow-up appointment?: 01/09/24 Follow-Up Specialty Provider:: Dr. Dorn Ross Do you need transportation to your follow-up appointment?: No Do you understand care options if your condition(s) worsen?: Yes-patient verbalized understanding   Mliss Creed [facility], BSN RN Care Manager/ Transition of Care [place]/ [facility] [phone]

## 2024-01-02 NOTE — Telephone Encounter (Signed)
 Copied from CRM [id number]. Topic: Clinical - Home Health Verbal Orders >> Jan 02, 2024  1:44 PM Ivette P wrote: Caller/Agency: [place] [facility]  Callback Number: [phone]  secured line  Service Requested: Physical Therapy Frequency: 1w 1  2w4 1w3  Any new concerns about the patient? Yes, four medication interactions   Mexiletine shows interaction with tiZANidine   Amiodarone shows interaction with tiZANidine   Amiodarone shows interaction with Digoxin  Amiodarone shows interaction with citalopram   Callback if action needs to be taken

## 2024-01-06 ENCOUNTER — Encounter

## 2024-01-06 ENCOUNTER — Ambulatory Visit

## 2024-01-06 ENCOUNTER — Other Ambulatory Visit: Payer: Self-pay

## 2024-01-06 DIAGNOSIS — I472 Ventricular tachycardia, unspecified: Secondary | ICD-10-CM

## 2024-01-06 NOTE — Patient Instructions (Signed)
 Visit Information  Thank you for taking time to visit with me today. Please don't hesitate to contact me if I can be of assistance to you before our next scheduled appointment.  Our next appointment is by telephone on 01/10/24 at 1pm Please call the care guide team at [phone] if you need to cancel or reschedule your appointment.   Following is a copy of your care plan:   Goals Addressed             This Visit's Progress    BSW Goals       Current SDOH Barriers:  Financial constraints related to clothing and home repairs Housing barriers ADL IADL limitations Social Isolation Lacks knowledge of community resource: clothes, home health, Assisted Living Facility   Interventions: Referred patient to community resources  Provided patient with information about Assisted Living and Home Health.  Patient declined ALF. Advised patient to work with [name] [name] Lingo to connect with services.  Collaborated with [employer], [employer], [employer] (community agency) re: civil service fast streamer, home repairs, isolation. SW provides resource information to [employer].          Please call 911 if you are experiencing a Mental Health or Behavioral Health Crisis or need someone to talk to.  Health Care Power of Gabriella [name] Lingo verbalized understanding of Care plan and visit instructions communicated this visit  [name], BSW [place]  [facility], [facility] Social Worker Direct Dial: [phone]  Fax: [phone] Website: [email or web address]

## 2024-01-06 NOTE — Patient Outreach (Signed)
 Social Drivers of Health  Community Resource and Care Coordination Visit Note   01/06/2024  Name: Douglas [name] MRN: [id number] DOB:May 08, 1955  Situation: Referral received for [facility] needs assessment and assistance related to Financial Strain  Social Connections Home Health. I obtained verbal consent from Patient.  Visit completed with Patient and [name] [name] Lingo on the phone.   Background:   SDOH Interventions Today    Flowsheet Row Most Recent Value  SDOH Interventions   Financial Strain Interventions Other (Comment)  [Referred to [employer] (clothing), [employer] (home repairs)]  Physical Activity Interventions Other (Comments)  [Referred to [employer] [facility]]  Social Connections Interventions Other (Comment)  [Referred to [employer] [facility]]  Health Literacy Interventions Other (Comment)  [Has [name] Tracey Dowdy]     Assessment:   Goals Addressed             This Visit's Progress    BSW Goals       Current SDOH Barriers:  Financial constraints related to clothing and home repairs Housing barriers ADL IADL limitations Social Isolation Lacks knowledge of community resource: clothes, home health, Assisted Living Facility   Interventions: Referred patient to community resources  Provided patient with information about Assisted Living and Home Health.  Patient declined ALF. Advised patient to work with [name] [name] Lingo to connect with services.  Collaborated with [employer], [employer], [employer] (community agency) re: civil service fast streamer, home repairs, isolation. SW provides resource information to [employer].          Recommendation:   follow up with [employer] regarding housing needs Follow up with [employer] for civil service fast streamer and [employer] for daily activities.   Follow Up Plan:   Telephone follow up appointment date/time:  01/10/24 at 1pm  [name], BSW [place]  [facility], [facility] Social Worker Direct Dial: [phone]  Fax: [phone] Website: [email or web address]

## 2024-01-08 LAB — CUP PACEART REMOTE DEVICE CHECK
Battery Remaining Longevity: 60 mo
Battery Voltage: 2.98 V
Brady Statistic AP VP Percent: 0 %
Brady Statistic AP VS Percent: 0 %
Brady Statistic AS VP Percent: 22.86 %
Brady Statistic AS VS Percent: 77.14 %
Brady Statistic RA Percent Paced: 0 %
Brady Statistic RV Percent Paced: 24.4 %
Date Time Interrogation Session: 20251112033408
HighPow Impedance: 75 Ohm
Implantable Lead Connection Status: 753985
Implantable Lead Connection Status: 753985
Implantable Lead Implant Date: 20060116
Implantable Lead Implant Date: 20130715
Implantable Lead Location: 753859
Implantable Lead Location: 753860
Implantable Lead Model: 5076
Implantable Lead Model: 6935
Implantable Pulse Generator Implant Date: 20200804
Lead Channel Impedance Value: 1805 Ohm
Lead Channel Impedance Value: 437 Ohm
Lead Channel Impedance Value: 513 Ohm
Lead Channel Pacing Threshold Amplitude: 0.5 V
Lead Channel Pacing Threshold Pulse Width: 0.4 ms
Lead Channel Sensing Intrinsic Amplitude: 25.75 mV
Lead Channel Sensing Intrinsic Amplitude: 25.75 mV
Lead Channel Sensing Intrinsic Amplitude: 4.125 mV
Lead Channel Sensing Intrinsic Amplitude: 4.125 mV
Lead Channel Setting Pacing Amplitude: 2 V
Lead Channel Setting Pacing Pulse Width: 0.4 ms
Lead Channel Setting Sensing Sensitivity: 0.3 mV
Zone Setting Status: 755011

## 2024-01-09 ENCOUNTER — Ambulatory Visit: Admitting: Cardiology

## 2024-01-09 NOTE — Progress Notes (Deleted)
 Clinical Summary Mr. Kluger is a 68 y.o.male   Past Medical History:  Diagnosis Date   A-fib (HCC)    AICD (automatic cardioverter/defibrillator) present    Anemia    Asthma    CAD (coronary artery disease)    CHF (congestive heart failure) (HCC)    Diabetes mellitus without complication (HCC)    Dyslipidemia    Elevated cholesterol    Hypertension      Allergies  Allergen Reactions   Xarelto  [Rivaroxaban ] Other (See Comments)    Bleeding ulcers   Codeine Hives and Itching   Lasix  [Furosemide ] Other (See Comments)    Patient says he gets light headed and dizzy.    Vioxx [Rofecoxib] Palpitations and Other (See Comments)    Caused massive heart attack     Current Outpatient Medications  Medication Sig Dispense Refill   albuterol  (VENTOLIN  HFA) 108 (90 Base) MCG/ACT inhaler Inhale 2 puffs into the lungs every 6 (six) hours as needed for wheezing or shortness of breath. 8 g 1   ALPRAZolam  (XANAX ) 1 MG tablet Take 1 tablet (1 mg total) by mouth at bedtime as needed for anxiety. 30 tablet 0   amiodarone (PACERONE) 200 MG tablet Take 2 tablets (400 mg total) by mouth 2 (two) times daily for 7 days, THEN take 1 tablet (200 mg total) by mouth daily. (Patient not taking: Reported on 01/01/2024) 148 tablet 0   amiodarone (PACERONE) 200 MG tablet Take 1 tablet (200 mg total) by mouth 2 (two) times daily. 180 tablet 3   amiodarone (PACERONE) 400 MG tablet Take 1 tablet (400 mg total) by mouth 2 (two) times daily for 7 days. (Patient not taking: Reported on 01/01/2024) 14 tablet 0   Carboxymethylcellulose Sodium (ARTIFICIAL TEARS OP) Place 1 drop into both eyes 3 (three) times daily.     citalopram  (CELEXA ) 20 MG tablet Take 20 mg by mouth daily.     Digoxin 62.5 MCG TABS Take 1 tablet (0.0625mg  total) by mouth daily. 30 tablet 3   ELIQUIS  5 MG TABS tablet TAKE ONE TABLET BY MOUTH TWICE DAILY 120 tablet 0   ezetimibe  (ZETIA ) 10 MG tablet Take 10 mg by mouth daily.      fluticasone  (FLONASE ) 50 MCG/ACT nasal spray Place 1 spray into both nostrils daily.     fluticasone -salmeterol (ADVAIR) 250-50 MCG/ACT AEPB Inhale 1 puff into the lungs in the morning and at bedtime. (Patient taking differently: Inhale 1 puff into the lungs daily.)     JARDIANCE  10 MG TABS tablet Take 10 mg by mouth daily.     lansoprazole (PREVACID) 30 MG capsule Take 30 mg by mouth daily.       losartan (COZAAR) 25 MG tablet Take 0.5 tablets (12.5 mg total) by mouth daily. 45 tablet 3   metoprolol  succinate (TOPROL -XL) 25 MG 24 hr tablet Take 1 tablet (25 mg total) by mouth daily. 30 tablet 2   mexiletine (MEXITIL) 150 MG capsule Take 1 capsule (150 mg total) by mouth every 12 (twelve) hours. 60 capsule 2   Omega-3 Fatty Acids (FISH OIL) 1200 MG CAPS Take 1,200 mg by mouth daily.     oxyCODONE -acetaminophen  (PERCOCET) 10-325 MG tablet Take 1 tablet by mouth every 6 (six) hours as needed for pain (use medication sparingly for severe pain only). (Patient taking differently: Take 1 tablet by mouth daily.) 30 tablet 0   rosuvastatin  (CRESTOR ) 40 MG tablet TAKE ONE (1) TABLET EACH DAY 90 tablet 0  spironolactone  (ALDACTONE ) 25 MG tablet Take 1 tablet (25 mg total) by mouth daily. 30 tablet 3   tamsulosin  (FLOMAX ) 0.4 MG CAPS capsule Take 1 capsule (0.4 mg total) by mouth daily. 30 capsule 3   tiZANidine  (ZANAFLEX ) 4 MG tablet Take 4 mg by mouth every 8 (eight) hours as needed for muscle spasms.     torsemide  (DEMADEX ) 20 MG tablet Take 2 tablets (40 mg total) by mouth daily. 60 tablet 3   No current facility-administered medications for this visit.     Past Surgical History:  Procedure Laterality Date   CARDIAC CATHETERIZATION  1996   stent placement   CARDIAC DEFIBRILLATOR PLACEMENT     CORONARY ARTERY BYPASS GRAFT  2002   times 4   CORONARY ARTERY BYPASS GRAFT     EYE SURGERY     ICD GENERATOR CHANGEOUT N/A 09/30/2018   Procedure: ICD GENERATOR CHANGEOUT;  Surgeon: Waddell Danelle ORN, MD;   Location: [facility] INVASIVE CV LAB;  Service: Cardiovascular;  Laterality: N/A;   IMPLANTABLE CARDIOVERTER DEFIBRILLATOR (ICD) GENERATOR CHANGE N/A 09/10/2011   Procedure: ICD GENERATOR CHANGE;  Surgeon: Danelle ORN Waddell, MD;  Location: [facility] CATH LAB;  Service: Cardiovascular;  Laterality: N/A;   INSERT / REPLACE / REMOVE PACEMAKER     LEFT HEART CATHETERIZATION WITH CORONARY ANGIOGRAM N/A 08/29/2011   Procedure: LEFT HEART CATHETERIZATION WITH CORONARY ANGIOGRAM;  Surgeon: Peter M Jordan, MD;  Location: [facility] CATH LAB;  Service: Cardiovascular;  Laterality: N/A;   RIGHT/LEFT HEART CATH AND CORONARY/GRAFT ANGIOGRAPHY N/A 12/18/2023   Procedure: RIGHT/LEFT HEART CATH AND CORONARY/GRAFT ANGIOGRAPHY;  Surgeon: Cherrie Toribio SAUNDERS, MD;  Location: [facility] INVASIVE CV LAB;  Service: Cardiovascular;  Laterality: N/A;     Allergies  Allergen Reactions   Xarelto  [Rivaroxaban ] Other (See Comments)    Bleeding ulcers   Codeine Hives and Itching   Lasix  [Furosemide ] Other (See Comments)    Patient says he gets light headed and dizzy.    Vioxx [Rofecoxib] Palpitations and Other (See Comments)    Caused massive heart attack      Family History  Problem Relation Age of Onset   Heart attack Father      Social History Mr. Feil reports that he has never smoked. He has never used smokeless tobacco. Mr. Chalker reports no history of alcohol  use.   Review of Systems CONSTITUTIONAL: No weight loss, fever, chills, weakness or fatigue.  HEENT: Eyes: No visual loss, blurred vision, double vision or yellow sclerae.No hearing loss, sneezing, congestion, runny nose or sore throat.  SKIN: No rash or itching.  CARDIOVASCULAR:  RESPIRATORY: No shortness of breath, cough or sputum.  GASTROINTESTINAL: No anorexia, nausea, vomiting or diarrhea. No abdominal pain or blood.  GENITOURINARY: No burning on urination, no polyuria NEUROLOGICAL: No headache, dizziness, syncope, paralysis, ataxia, numbness or tingling in the  extremities. No change in bowel or bladder control.  MUSCULOSKELETAL: No muscle, back pain, joint pain or stiffness.  LYMPHATICS: No enlarged nodes. No history of splenectomy.  PSYCHIATRIC: No history of depression or anxiety.  ENDOCRINOLOGIC: No reports of sweating, cold or heat intolerance. No polyuria or polydipsia.  SABRA   Physical Examination There were no vitals filed for this visit. There were no vitals filed for this visit.  Gen: resting comfortably, no acute distress HEENT: no scleral icterus, pupils equal round and reactive, no palptable cervical adenopathy,  CV Resp: Clear to auscultation bilaterally GI: abdomen is soft, non-tender, non-distended, normal bowel sounds, no hepatosplenomegaly MSK: extremities are warm, no edema.  Skin: warm, no rash Neuro:  no focal deficits Psych: appropriate affect   Diagnostic Studies     Assessment and Plan        Dorn PHEBE Ross, M.D., F.A.C.C.

## 2024-01-09 NOTE — Progress Notes (Signed)
 Remote ICD Transmission

## 2024-01-09 NOTE — Telephone Encounter (Signed)
 Message was relayed per provider notes   Caller/Agency: [facility]  Callback Number: [phone]  secured line  Service Requested: Physical Therapy Frequency: 1w 1  2w4 1w3   Any new concerns about the patient? Yes, four medication interactions    Mexiletine shows interaction with tiZANidine    Amiodarone shows interaction with tiZANidine    Amiodarone shows interaction with Digoxin   Amiodarone shows interaction with citalopram    Callback if action needs to be taken

## 2024-01-10 ENCOUNTER — Other Ambulatory Visit

## 2024-01-10 ENCOUNTER — Other Ambulatory Visit: Payer: Self-pay | Admitting: Physician Assistant

## 2024-01-10 DIAGNOSIS — R296 Repeated falls: Secondary | ICD-10-CM

## 2024-01-10 DIAGNOSIS — I5022 Chronic systolic (congestive) heart failure: Secondary | ICD-10-CM

## 2024-01-10 DIAGNOSIS — I255 Ischemic cardiomyopathy: Secondary | ICD-10-CM

## 2024-01-10 DIAGNOSIS — R5381 Other malaise: Secondary | ICD-10-CM

## 2024-01-10 NOTE — Patient Outreach (Signed)
 Social Drivers of Health  Community Resource and Care Coordination Visit Note   01/10/2024  Name: Douglas [name] MRN: [id number] DOB:06/22/1955  Situation: Referral received for [facility] needs assessment and assistance related to Social Connections and bath. I obtained verbal consent from [name].  Visit completed with Douglas [name] on the phone.   Background:      Assessment:   Goals Addressed   None     Recommendation:   [name] to revisit patient attending [facility] and continue to work with [employer] for additional clothing.  Follow Up Plan:   Telephone follow up appointment date/time:  01/28/24 at 11am  Douglas [name], BSW [place]  [facility], [facility] Social Worker Direct Dial: [phone]  Fax: [phone] Website: [email or web address]

## 2024-01-10 NOTE — Patient Instructions (Signed)
 Visit Information  Thank you for taking time to visit with me today. Please don't hesitate to contact me if I can be of assistance to you before our next scheduled appointment.  Your next care management appointment is by telephone on 01/28/24 at 11am   Please call the care guide team at [phone] if you need to cancel, schedule, or reschedule an appointment.   Please call 911 if you are experiencing a Mental Health or Behavioral Health Crisis or need someone to talk to.  [name], BSW [place]  [facility], [facility] Social Worker Direct Dial: [phone]  Fax: [phone] Website: [email or web address]

## 2024-01-10 NOTE — Patient Outreach (Signed)
 Current SDOH Barriers:  Social Environmental Health Practitioner assistance  Interventions: Patient interviewed and appropriate screenings performed Advised patient to consider [employer] with primary care provider re: bath nurse* [name] Dowdy reports patient declined [employer] because of clothing issues.  [name] Dowdy purchased additional jogging pants and is working with [employer] for more clothing.  [name] Dowdy purchased a new stove and microwave and patient is cooking more frequently.  Patient has a small bathroom and the shower is too small for a bench. Patient struggles with bathing.  Patient receives PT.  SW will message provider to see if orders for a bath nurse is appropriate.

## 2024-01-12 ENCOUNTER — Ambulatory Visit: Payer: Self-pay | Admitting: Internal Medicine

## 2024-01-13 ENCOUNTER — Ambulatory Visit: Payer: Self-pay

## 2024-01-13 NOTE — Telephone Encounter (Signed)
 FYI Only or Action Required?: FYI only for provider: appointment scheduled on 01/14/24.  Patient was last seen in primary care on 12/30/2023 by [place], [place], [place].  Called Nurse Triage reporting Sore.  Symptoms began several days ago.  Interventions attempted: Other: open to air.  Symptoms are: unchanged.  Triage Disposition: See Physician Within 24 Hours  Patient/caregiver understands and will follow disposition?: Yes   Copied from CRM #[id number]. Topic: Clinical - Red Word Triage >> Jan 13, 2024  4:47 PM Jasmin G wrote: Red Word that prompted transfer to Nurse Triage: Pt's nurse called to report potential leg ulcers above ankles, she states that they're about an inch long and look like open sores on both ankles, there's also blistering around it with clear fluid and blood. She also reported that pt's left nostril has been bleeding. Reason for Disposition  [1] Unexplained sores AND [2] 3 or more  Answer Assessment - Initial Assessment Questions Additional info: Home physical therapy assistant calling to report sores on bilateral ankles. Patient reports socks were too tight and caused sores. PTA reports red abrasion that has clear fluid weeping with possible blister.  Appointment scheduled with pcp on 01/14/24    1. APPEARANCE of SORES: What do the sores look like?     Abrasion is red weeping clear fluid  2. NUMBER: How many sores are there?     Two, one on each leg just above ankle 3. SIZE: How big is the largest sore?     1inch  4. LOCATION: Where are the sores located?     Bilateral ankle 5. ONSET: When did the sores begin?     Few days 6. TENDER: Does it hurt when you touch it?  (Scale 1-10; or mild, moderate, severe)      yes 7. CAUSE: What do you think is causing the sores?     Socks were too tight  8. OTHER SYMPTOMS: Do you have any other symptoms? (e.g., fever, new weakness)     Productive clear cough with blood streak chronic, right nostril  intermittent bleed-not currently bleeding.  Protocols used: [employer]

## 2024-01-14 ENCOUNTER — Ambulatory Visit (INDEPENDENT_AMBULATORY_CARE_PROVIDER_SITE_OTHER): Admitting: Physician Assistant

## 2024-01-14 ENCOUNTER — Encounter: Payer: Self-pay | Admitting: Physician Assistant

## 2024-01-14 VITALS — BP 107/74 | HR 73 | Temp 97.5°F | Ht 69.0 in | Wt 254.2 lb

## 2024-01-14 DIAGNOSIS — I83029 Varicose veins of left lower extremity with ulcer of unspecified site: Secondary | ICD-10-CM

## 2024-01-14 DIAGNOSIS — I83019 Varicose veins of right lower extremity with ulcer of unspecified site: Secondary | ICD-10-CM

## 2024-01-14 DIAGNOSIS — R040 Epistaxis: Secondary | ICD-10-CM | POA: Diagnosis not present

## 2024-01-14 DIAGNOSIS — G8929 Other chronic pain: Secondary | ICD-10-CM

## 2024-01-14 DIAGNOSIS — F411 Generalized anxiety disorder: Secondary | ICD-10-CM

## 2024-01-14 DIAGNOSIS — M545 Low back pain, unspecified: Secondary | ICD-10-CM

## 2024-01-14 DIAGNOSIS — L97929 Non-pressure chronic ulcer of unspecified part of left lower leg with unspecified severity: Secondary | ICD-10-CM

## 2024-01-14 DIAGNOSIS — L97919 Non-pressure chronic ulcer of unspecified part of right lower leg with unspecified severity: Secondary | ICD-10-CM

## 2024-01-14 MED ORDER — OXYCODONE-ACETAMINOPHEN 10-325 MG PO TABS: 1.0000 | ORAL_TABLET | Freq: Three times a day (TID) | ORAL | 0 refills | Status: DC | PRN

## 2024-01-14 NOTE — Assessment & Plan Note (Signed)
 Anxiety managed ineffectively with current Xanax  regimen. Patient prefers previous regimen of Xanax  2-3 times daily. Lengthy discussion on medication safety, side effects, medication interactions, addiction, and tolerance potential with Xanax .  - Continue Xanax  1 mg oral at bedtime as needed with goal to taper over time.

## 2024-01-14 NOTE — Assessment & Plan Note (Signed)
 Chronic pain with multiple previous fractures. Current management includes Zanaflex  and Percocet once daily. Medication interaction concerns due to amiodarone. Lengthy discussion on safety of medication use and addicting nature of pain medications. Patient unhappy with current pain management. Discussed previous pain management referral.  - Continue Zanaflex  as needed. - Small refill of Percocet given for severe pain. Number given to pain clinic, patient advised to call and schedule appointment for additional refills.  - Referred to pain specialist for further evaluation.

## 2024-01-14 NOTE — Telephone Encounter (Signed)
Noted patient scheduled

## 2024-01-14 NOTE — Assessment & Plan Note (Signed)
 Intermittent nosebleeds likely due to dry air. - Use a humidifier to maintain air moisture. - Apply Vaseline inside nostrils for moisture. - Warning signs discussed.

## 2024-01-14 NOTE — Progress Notes (Signed)
 Acute Office Visit  Subjective:     Patient ID: Douglas [name], male    DOB: [birth year], 68 y.o.   MRN: [id number]   Discussed the use of AI scribe software for clinical note transcription with the patient, who gave verbal consent to proceed.  History of Present Illness Douglas [name] is a 68 year old male who presents with sores around his ankles.  Approximately one week ago, he developed fluid-filled blisters around his ankles after wearing tight socks, which have since opened. Both ankles are affected in the same location. Denies fevers or worsening leg swelling.   He reports concerns for epistaxis occurring more frequently in the last few weeks, initially from the left nostril and now from the right, with light-colored red blood present upon nasal blowing.  His medication regimen includes Percocet, Xanax  for anxiety, and Zanaflex  for muscle spasms. He takes one Xanax  daily and uses Zanaflex  sparingly. Percocet is used for pain management due to a codeine allergy. He is interested in restarting previous regimen of pain medication and xanax  2-3 times daily.  Patient is socially isolated and spends most of this visit in causal conversation. He is difficult to redirect and has a hard time staying on topic. He is accompanied by his health care POAs today.    Review of Systems  Constitutional:  Negative for chills and fever.  HENT:  Positive for nosebleeds. Negative for congestion.   Musculoskeletal:  Positive for back pain, joint pain and neck pain.  Skin:  Negative for itching and rash.       Bilateral ankle blisters  Psychiatric/Behavioral:  The patient is nervous/anxious.         Objective:     BP 107/74 (BP Location: Right Arm, Patient Position: Sitting)   Pulse 73   Temp (!) 97.5 F (36.4 C)   Ht 5' 9 (1.753 m)   Wt 254 lb 4 oz (115.3 kg)   SpO2 96%   BMI 37.55 kg/m   Physical Exam Constitutional:      General: He is not in acute distress.    Appearance:  Normal appearance. He is obese. He is not ill-appearing.  HENT:     Head: Normocephalic and atraumatic.     Mouth/Throat:     Mouth: Mucous membranes are moist.     Pharynx: Oropharynx is clear.  Eyes:     Extraocular Movements: Extraocular movements intact.     Conjunctiva/sclera: Conjunctivae normal.  Cardiovascular:     Rate and Rhythm: Normal rate and regular rhythm.     Heart sounds: Normal heart sounds. No murmur heard. Pulmonary:     Effort: Pulmonary effort is normal.     Breath sounds: Normal breath sounds. No rales.  Musculoskeletal:     Right lower leg: No edema.     Left lower leg: No edema.  Skin:    General: Skin is warm and dry.     Comments: Bilateral stasis dermatitis with open and healing blisters on the medial aspect of his proximal ankles. Minimal associated erythema. No purulence or drainage.   Neurological:     General: No focal deficit present.     Mental Status: He is alert and oriented to person, place, and time.  Psychiatric:        Mood and Affect: Mood normal.        Behavior: Behavior normal.     No results found for any visits on 01/14/24.      Assessment & Plan:  Generalized anxiety disorder Assessment & Plan: Anxiety managed ineffectively with current Xanax  regimen. Patient prefers previous regimen of Xanax  2-3 times daily. Lengthy discussion on medication safety, side effects, medication interactions, addiction, and tolerance potential with Xanax .  - Continue Xanax  1 mg oral at bedtime as needed with goal to taper over time.    Chronic bilateral low back pain, unspecified whether sciatica present Assessment & Plan: Chronic pain with multiple previous fractures. Current management includes Zanaflex  and Percocet once daily. Medication interaction concerns due to amiodarone. Lengthy discussion on safety of medication use and addicting nature of pain medications. Patient unhappy with current pain management. Discussed previous pain management  referral.  - Continue Zanaflex  as needed. - Small refill of Percocet given for severe pain. Number given to pain clinic, patient advised to call and schedule appointment for additional refills.  - Referred to pain specialist for further evaluation.  Orders: -     oxyCODONE -Acetaminophen ; Take 1 tablet by mouth every 8 (eight) hours as needed.  Dispense: 24 tablet; Refill: 0  Venous stasis ulcers of both lower extremities (HCC) Assessment & Plan: Blister-like areas from tight socks, no infection signs. Minimal leg swelling on exam. Stasis dermatitis present on bilateral lower extremities.  - Advised looser socks to prevent friction. - Instructed to keep areas open to air for healing. - Monitor for infection signs such as increased pain, redness, warmth or fevers and return if present.   Epistaxis Assessment & Plan: Intermittent nosebleeds likely due to dry air. - Use a humidifier to maintain air moisture. - Apply Vaseline inside nostrils for moisture. - Warning signs discussed.     I personally spent a total of 45 minutes in the care of the patient today including preparing to see the patient, performing a medically appropriate exam/evaluation, counseling and educating, documenting clinical information in the EHR, communicating results, and coordinating care.   Return if symptoms worsen or fail to improve.  Charmaine Hayze Gazda, PA-C

## 2024-01-14 NOTE — Patient Instructions (Signed)
 [facility]  Address: [address], [place], [place] Phone: [phone] Fax: [phone]  Call to schedule for additional refills on pain medication

## 2024-01-14 NOTE — Assessment & Plan Note (Signed)
 Blister-like areas from tight socks, no infection signs. Minimal leg swelling on exam. Stasis dermatitis present on bilateral lower extremities.  - Advised looser socks to prevent friction. - Instructed to keep areas open to air for healing. - Monitor for infection signs such as increased pain, redness, warmth or fevers and return if present.

## 2024-01-15 ENCOUNTER — Other Ambulatory Visit: Payer: Self-pay | Admitting: Physician Assistant

## 2024-01-15 DIAGNOSIS — F411 Generalized anxiety disorder: Secondary | ICD-10-CM

## 2024-01-15 NOTE — Telephone Encounter (Signed)
 Copied from CRM #[id number]. Topic: Clinical - Medication Refill >> Jan 15, 2024  3:59 PM Edsel HERO wrote: Medication: ALPRAZolam  (XANAX ) 1 MG tablet  Has the patient contacted their pharmacy? No  This is the patient's preferred pharmacy:  [facility] - [place], [place] - [address] [place] [address] Phone: [phone] Fax: [phone]  Is this the correct pharmacy for this prescription? Yes   Has the prescription been filled recently? Yes  Is the patient out of the medication? Yes  Has the patient been seen for an appointment in the last year OR does the patient have an upcoming appointment? Yes  Can we respond through MyChart? Yes

## 2024-01-16 ENCOUNTER — Other Ambulatory Visit: Payer: Self-pay | Admitting: Licensed Clinical Social Worker

## 2024-01-16 ENCOUNTER — Encounter: Payer: Self-pay | Admitting: Cardiology

## 2024-01-16 ENCOUNTER — Ambulatory Visit: Admitting: Student

## 2024-01-16 ENCOUNTER — Ambulatory Visit: Payer: Self-pay

## 2024-01-16 NOTE — Patient Outreach (Addendum)
 Complex Care Management   Visit Note  01/16/2024  Name:  Douglas [name] MRN: [id number] DOB: December 25, 1955  Situation: Referral received for Complex Care Management related to Mental/Behavioral Health diagnosis Anxiety. I obtained verbal consent from Patient.  Visit completed with Patient  on the phone  Background:   Past Medical History:  Diagnosis Date   A-fib (HCC)    AICD (automatic cardioverter/defibrillator) present    Anemia    Asthma    CAD (coronary artery disease)    CHF (congestive heart failure) (HCC)    Diabetes mellitus without complication (HCC)    Dyslipidemia    Elevated cholesterol    Hypertension     Assessment: Patient Reported Symptoms:  Cognitive Cognitive Status: Able to follow simple commands, Alert and oriented to person, place, and time Cognitive/Intellectual Conditions Management [RPT]: None reported or documented in medical history or problem list   Health Maintenance Behaviors: Annual physical exam Healing Pattern: Average Health Facilitated by: Rest  Neurological Neurological Review of Symptoms: No symptoms reported Neurological Management Strategies: Routine screening Neurological Self-Management Outcome: 4 (good)  HEENT HEENT Symptoms Reported: Ear dryness, Sore throat HEENT Management Strategies: Adequate rest, Routine screening HEENT Self-Management Outcome: 4 (good)    Cardiovascular Cardiovascular Symptoms Reported: No symptoms reported Does patient have uncontrolled Hypertension?: No Cardiovascular Management Strategies: Routine screening Cardiovascular Self-Management Outcome: 4 (good)  Respiratory Respiratory Symptoms Reported: No symptoms reported Other Respiratory Symptoms: Was encouraged to use a humidifier for mucus relief Respiratory Management Strategies: Routine screening Respiratory Self-Management Outcome: 4 (good)  Endocrine Endocrine Symptoms Reported: No symptoms reported Is patient diabetic?: No Endocrine  Self-Management Outcome: 5 (very good)  Gastrointestinal Gastrointestinal Symptoms Reported: No symptoms reported Gastrointestinal Self-Management Outcome: 4 (good)    Genitourinary Genitourinary Symptoms Reported: No symptoms reported Genitourinary Self-Management Outcome: 4 (good)  Integumentary Integumentary Symptoms Reported: Wound Additional Integumentary Details: Pt has two current bed sores Skin Management Strategies: Routine screening Skin Self-Management Outcome: 3 (uncertain)  Musculoskeletal Musculoskelatal Symptoms Reviewed: Muscle pain, Unsteady gait Musculoskeletal Management Strategies: Routine screening Musculoskeletal Self-Management Outcome: 4 (good) Musculoskeletal Comment: reviewed safety precautions      Psychosocial Psychosocial Symptoms Reported: Anxiety - if selected complete GAD, Depression - if selected complete PHQ 2-9 Behavioral Management Strategies: Coping strategies, Medication therapy Behavioral Health Self-Management Outcome: 4 (good) Behavioral Health Comment: I am good as long as I have my xanax . I do not need therapy Major Change/Loss/Stressor/Fears (CP): Medical condition, self Techniques to Cope with Loss/Stress/Change: Diversional activities Quality of Family Relationships: involved Do you feel physically threatened by others?: No    01/16/2024    PHQ2-9 Depression Screening   Little interest or pleasure in doing things More than half the days  Feeling down, depressed, or hopeless Nearly every day  PHQ-2 - Total Score 5  Trouble falling or staying asleep, or sleeping too much More than half the days  Feeling tired or having little energy Nearly every day  Poor appetite or overeating  Not at all  Feeling bad about yourself - or that you are a failure or have let yourself or your family down Not at all  Trouble concentrating on things, such as reading the newspaper or watching television More than half the days  Moving or speaking so slowly  that other people could have noticed.  Or the opposite - being so fidgety or restless that you have been moving around a lot more than usual Not at all  Thoughts that you would be better off dead, or hurting  yourself in some way Not at all  PHQ2-9 Total Score 12  If you checked off any problems, how difficult have these problems made it for you to do your work, take care of things at home, or get along with other people Very difficult  Depression Interventions/Treatment Patient refuses Treatment, Medication      01/16/2024    3:02 PM 01/02/2024    3:42 PM 01/01/2024   10:13 AM 12/30/2023   11:52 AM  GAD 7 : Generalized Anxiety Score  Nervous, Anxious, on Edge 2 -- 3 3  Control/stop worrying 2  3 2   Worry too much - different things 3  3 3   Trouble relaxing 3  2 3   Restless 3  3 2   Easily annoyed or irritable 2  2 3   Afraid - awful might happen 1  3 1   Total GAD 7 Score 16  19 17   Anxiety Difficulty Very difficult  Very difficult Very difficult   There were no vitals filed for this visit. Pain Scale: 0-10 Pain Score: 1  Pain Type: Chronic pain  Medications Reviewed Today     Reviewed by Merlynn Lyle CROME, LCSW (Social Worker) on 01/16/24 at 1426  Med List Status: <None>   Medication Order Taking? Sig Documenting Provider Last Dose Status Informant  albuterol  (VENTOLIN  HFA) 108 (90 Base) MCG/ACT inhaler [id number]  Inhale 2 puffs into the lungs every 6 (six) hours as needed for wheezing or shortness of breath. Grooms, [place], PA-C  Active Self, Pharmacy Records           Med Note (WARD, CHUCK MATSU   Thu Dec 12, 2023  5:20 PM) Pt needs refill  ALPRAZolam  (XANAX ) 1 MG tablet [id number]  Take 1 tablet (1 mg total) by mouth at bedtime as needed for anxiety. Grooms, Charmaine, [place]  Active   amiodarone  (PACERONE ) 200 MG tablet [id number]  Take 2 tablets (400 mg total) by mouth 2 (two) times daily for 7 days, THEN take 1 tablet (200 mg total) by mouth daily. Samtani, Jai-Gurmukh, MD  Active    amiodarone  (PACERONE ) 200 MG tablet [id number]  Take 1 tablet (200 mg total) by mouth 2 (two) times daily. [place], [place], [place]  Active   amiodarone  (PACERONE ) 400 MG tablet [id number]  Take 1 tablet (400 mg total) by mouth 2 (two) times daily for 7 days. Samtani, Jai-Gurmukh, MD  Expired 01/14/24 2359   Carboxymethylcellulose Sodium (ARTIFICIAL TEARS OP) [id number]  Place 1 drop into both eyes 3 (three) times daily. [provider]  Active Self, Pharmacy Records  citalopram  (CELEXA ) 20 MG tablet [id number]  Take 20 mg by mouth daily. [provider]  Active Self, Pharmacy Records  Digoxin  62.5 MCG TABS [id number]  Take 1 tablet (0.0625mg  total) by mouth daily. Samtani, Jai-Gurmukh, MD  Active   ELIQUIS  5 MG TABS tablet [id number]  TAKE ONE TABLET BY MOUTH TWICE DAILY Lavona Agent, MD  Active Self, Pharmacy Records  ezetimibe  (ZETIA ) 10 MG tablet [id number]  Take 10 mg by mouth daily. [provider]  Active Self, Pharmacy Records  fluticasone  (FLONASE ) 50 MCG/ACT nasal spray [id number]  Place 1 spray into both nostrils daily. [provider]  Active Self, Pharmacy Records  fluticasone -salmeterol (ADVAIR) 250-50 MCG/ACT AEPB [id number]  Inhale 1 puff into the lungs in the morning and at bedtime.  Patient taking differently: Inhale 1 puff into the lungs daily.   [provider]  Active Self, Pharmacy Records  JARDIANCE  10 MG TABS tablet [id number]  Take 10 mg by mouth daily. [provider]  Active Self, Pharmacy Records           Med Note DRENA, CHUCK KANDICE Schaumann Dec 12, 2023  5:19 PM)    lansoprazole (PREVACID) 30 MG capsule [id number]  Take 30 mg by mouth daily.   [provider]  Active Self, Pharmacy Records  losartan  (COZAAR ) 25 MG tablet [id number]  Take 0.5 tablets (12.5 mg total) by mouth daily. [place], Harlene HERO, [place]  Active   metoprolol  succinate (TOPROL -XL) 25 MG 24 hr tablet [id number]  Take 1 tablet (25 mg total) by mouth daily.  Samtani, Jai-Gurmukh, MD  Active   mexiletine (MEXITIL ) 150 MG capsule [id number]  Take 1 capsule (150 mg total) by mouth every 12 (twelve) hours. Samtani, Jai-Gurmukh, MD  Active   Omega-3 Fatty Acids (FISH OIL) 1200 MG CAPS [id number]  Take 1,200 mg by mouth daily. [provider]  Active Self, Pharmacy Records  oxyCODONE -acetaminophen  (PERCOCET) 10-325 MG tablet [id number]  Take 1 tablet by mouth every 8 (eight) hours as needed. Grooms, Charmaine, [place]  Active   rosuvastatin  (CRESTOR ) 40 MG tablet [id number]  TAKE ONE (1) TABLET EACH DAY Hochrein, James, MD  Active Self, Pharmacy Records  spironolactone  (ALDACTONE ) 25 MG tablet [id number]  Take 1 tablet (25 mg total) by mouth daily. Samtani, Jai-Gurmukh, MD  Active   tamsulosin  (FLOMAX ) 0.4 MG CAPS capsule [id number]  Take 1 capsule (0.4 mg total) by mouth daily. Samtani, Jai-Gurmukh, MD  Active   tiZANidine  (ZANAFLEX ) 4 MG tablet [id number]  Take 4 mg by mouth every 8 (eight) hours as needed for muscle spasms. [provider]  Active Self, Pharmacy Records  torsemide  (DEMADEX ) 20 MG tablet [id number]  Take 2 tablets (40 mg total) by mouth daily. Samtani, Jai-Gurmukh, MD  Active             SDOH Interventions    Flowsheet Row Patient Outreach Telephone from 01/16/2024 in [place] Patient Outreach Telephone from 01/06/2024 in [facility] Patient Outreach Telephone from 01/01/2024 in [facility] DEPART[facility] ED to Hosp-Admission (Discharged) from 05/28/2023 in [facility]  SDOH Interventions      Food Insecurity Interventions Community Resources Provided, [place] Resources Referral -- [place] Resources Referral --  Housing Interventions -- -- Intervention Not Indicated --  Transportation Interventions -- -- Patient Resources Dietitian) Intervention Not Indicated  Utilities Interventions -- -- Intervention Not  Indicated, Patient Declined --  Depression Interventions/Treatment  Patient refuses Treatment, Medication -- -- --  Surveyor, Quantity Strain Interventions Other (Comment)  [Referred to [employer] (clothing), [employer] (home repairs) - referrals placed by BSW already, Food delivery find help referral placed today by LCSW] Other (Comment)  [Referred to [employer] (clothing), [employer] (home repairs)] -- --  Physical Activity Interventions -- Other (Comments)  [Referred to [facility]] -- --  Social Connections Interventions [facility] Provided, Other (Comment)  Clinical Research Associate encouraged] Other (Comment)  [Referred to [facility]] -- --  Health Literacy Interventions Other (Comment)  [Has [name] /Tracey] Other (Comment)  [Has [name] Tracey Dowdy] -- --   Recommendation:   PCP Follow-up Continue Current Plan of Care Call 911 or [phone] if a crisis occurs  Follow Up Plan:   Telephone follow-up in 1 month by VBCI BSW, no clinical social work needs  [name], FINANCIAL PLANNER, MSW, [employer] Licensed Engineer, Maintenance (it)  Health   [facility] [email or web address] Direct Dial: [phone]

## 2024-01-16 NOTE — Telephone Encounter (Signed)
Reason for Disposition . [1] Caller requesting NON-URGENT health information AND [2] PCP's office is the best resource  Protocols used: Information Only Call - No Triage-A-AH

## 2024-01-16 NOTE — Telephone Encounter (Addendum)
 1st attempt, voicemail not set up. Unable to leave message.  Copied from CRM #[id number]. Topic: Clinical - Medication Question >> Jan 16, 2024  4:20 PM Shanda MATSU wrote: Reason for CRM: Patient called in to please adv PCP to hurry up and send refill req for med, ALPRAZolam  (XANAX ) 1 MG tablet. Patient stated that if anything happens to him his lawyers are prepared to handle this issue with him.

## 2024-01-16 NOTE — Telephone Encounter (Signed)
 FYI Only or Action Required?: Action required by provider: med refill status.  Patient was last seen in primary care on 01/14/2024 by Grooms, [place], [place].  Called Nurse Triage reporting Advice Only.  Symptoms began several days ago.  Interventions attempted: Rest, hydration, or home remedies.  Symptoms are: stable.  Triage Disposition: No disposition on file.  Patient/caregiver understands and will follow disposition?:   Answer Assessment - Initial Assessment Questions Patient is completely out of medication. Patient seems upset about PCP cutting down dosage of Xanax , states Ms. Grooms messed up, I'm not supposed to be taking it just once daily Patient states medication gets delivered to his home from pharmacy.  1. REASON FOR CALL: What is the main reason for your call? or How can I best help you?     Calling for status on refill for ALPRAZolam  (XANAX ) 1 MG, advised patient of the 3 days for medication refills, stated it was sent to PCP today and will be refilled soon.   2. SYMPTOMS : Do you have any symptoms?      Experiencing nervousness and anxiety  Protocols used: Information Only Call - No Triage-A-AH

## 2024-01-17 ENCOUNTER — Telehealth (HOSPITAL_COMMUNITY): Payer: Self-pay

## 2024-01-17 NOTE — Telephone Encounter (Signed)
 Called to confirm/remind patient of [facility] on [facility]20[facility].   Appointment:   [x] Confirmed  [] Left mess   [] No answer/No voice mail  [] VM Full/unable to leave message  [] Phone not in service  Patient reminded to bring all medications and/or complete list.  Confirmed patient has transportation. Gave directions, instructed to utilize valet parking.   Patient states that he will talk with his neighbor regarding his transportation. Patient will call if there is any issue.

## 2024-01-17 NOTE — Patient Instructions (Signed)
 Visit Information  Thank you for taking time to visit with me today. Please don't hesitate to contact me if I can be of assistance to you before our next scheduled appointment.  Our next appointment is by telephone on 01/28/24 at 11 am Please call the care guide team at [phone] if you need to cancel or reschedule your appointment.   Following is a copy of your care plan:   Goals Addressed             This Visit's Progress    LCSW VBCI Social Work Care Plan       Problems:  Financial constraints related to clothing and home repairs Housing barriers ADL IADL limitations Social Isolation Lacks knowledge of community resource: clothes, home health, Assisted Living Facility Anxiety and Depression Dx Chronic Pain  CSW Clinical Goal(s):   Over the next 90 days the Patient will demonstrate a reduction in symptoms related to Depression: anxiety Insomnia/Sleep Difficulties .  Interventions:  Mental Health:  Evaluation of current treatment plan related to Anxiety Active listening / Reflection utilized Consideration on in-home help encouraged : options discussed Depression screen reviewed Emotional Support Provided Mindfulness or Relaxation training provided Motivational Interviewing employed PHQ2/PHQ9 completed Problem Solving /Task Center strategies reviewed Provided general psycho-education for mental health needs Quality of sleep assessed & Sleep Hygiene techniques promoted Solution-Focued Strategies employed: Suicidal Ideation/Homicidal Ideation assessed: No SI/HI Patient denies need for psychiatry or therapy referral at this time but is agreeable to consider senior socialization resources in his area to alleviate symptoms. Referred patient to [facility] community resource Provided patient with information about Assisted Living and Home Health.  Patient declined ALF. Advised patient to work with [name] [name] Lingo to connect with services. Pt reports that his support system  includes his neighbors who are agreeable to assisting him with his transportation needs as well.  Patient Goals/Self-Care Activities:  Increase coping skills, healthy habits, self-management skills, and stress reduction  Plan:   The care management team will reach out to the patient again over the next 30 days.        Please call the Suicide and Crisis Lifeline: 988 call the [place]  National Suicide Prevention Lifeline: 316 707 3747 or TTY: 754-618-2836 TTY 912-309-1823) to talk to a trained counselor call 1-800-273-TALK (toll free, 24 hour hotline) go to [facility] [address], [place] [phone]) call the [facility] Crisis Line: 3804753817 call 911 if you are experiencing a Mental Health or Behavioral Health Crisis or need someone to talk to.  Patient verbalized understanding of Care plan and visit instructions communicated this visit  [name], BSW, MSW, LCSW Licensed Clinical Social Worker [employer]   [facility] [email or web address] Direct Dial: [phone]

## 2024-01-20 ENCOUNTER — Telehealth: Payer: Self-pay

## 2024-01-20 ENCOUNTER — Ambulatory Visit (HOSPITAL_COMMUNITY)
Admission: RE | Admit: 2024-01-20 | Discharge: 2024-01-20 | Disposition: A | Discharge status: Home / Self Care | Source: Ambulatory Visit | Attending: Family Medicine | Admitting: Family Medicine

## 2024-01-20 ENCOUNTER — Encounter (HOSPITAL_COMMUNITY): Payer: Self-pay

## 2024-01-20 VITALS — BP 122/84 | HR 65 | Ht 69.0 in | Wt 248.0 lb

## 2024-01-20 DIAGNOSIS — T24002D Burn of unspecified degree of unspecified site of left lower limb, except ankle and foot, subsequent encounter: Secondary | ICD-10-CM | POA: Insufficient documentation

## 2024-01-20 DIAGNOSIS — Z7901 Long term (current) use of anticoagulants: Secondary | ICD-10-CM | POA: Diagnosis not present

## 2024-01-20 DIAGNOSIS — E785 Hyperlipidemia, unspecified: Secondary | ICD-10-CM | POA: Diagnosis not present

## 2024-01-20 DIAGNOSIS — Z79891 Long term (current) use of opiate analgesic: Secondary | ICD-10-CM | POA: Insufficient documentation

## 2024-01-20 DIAGNOSIS — T24001D Burn of unspecified degree of unspecified site of right lower limb, except ankle and foot, subsequent encounter: Secondary | ICD-10-CM | POA: Insufficient documentation

## 2024-01-20 DIAGNOSIS — I480 Paroxysmal atrial fibrillation: Secondary | ICD-10-CM | POA: Diagnosis not present

## 2024-01-20 DIAGNOSIS — G8929 Other chronic pain: Secondary | ICD-10-CM | POA: Diagnosis not present

## 2024-01-20 DIAGNOSIS — I5022 Chronic systolic (congestive) heart failure: Secondary | ICD-10-CM | POA: Diagnosis present

## 2024-01-20 DIAGNOSIS — J45909 Unspecified asthma, uncomplicated: Secondary | ICD-10-CM | POA: Diagnosis not present

## 2024-01-20 DIAGNOSIS — I2510 Atherosclerotic heart disease of native coronary artery without angina pectoris: Secondary | ICD-10-CM | POA: Diagnosis not present

## 2024-01-20 DIAGNOSIS — F419 Anxiety disorder, unspecified: Secondary | ICD-10-CM | POA: Insufficient documentation

## 2024-01-20 DIAGNOSIS — Z951 Presence of aortocoronary bypass graft: Secondary | ICD-10-CM | POA: Diagnosis not present

## 2024-01-20 DIAGNOSIS — X102XXD Contact with fats and cooking oils, subsequent encounter: Secondary | ICD-10-CM | POA: Insufficient documentation

## 2024-01-20 DIAGNOSIS — I83029 Varicose veins of left lower extremity with ulcer of unspecified site: Secondary | ICD-10-CM

## 2024-01-20 DIAGNOSIS — K219 Gastro-esophageal reflux disease without esophagitis: Secondary | ICD-10-CM | POA: Diagnosis not present

## 2024-01-20 DIAGNOSIS — Z79899 Other long term (current) drug therapy: Secondary | ICD-10-CM | POA: Diagnosis not present

## 2024-01-20 DIAGNOSIS — I255 Ischemic cardiomyopathy: Secondary | ICD-10-CM | POA: Diagnosis not present

## 2024-01-20 DIAGNOSIS — L97929 Non-pressure chronic ulcer of unspecified part of left lower leg with unspecified severity: Secondary | ICD-10-CM

## 2024-01-20 DIAGNOSIS — I110 Hypertensive heart disease with heart failure: Secondary | ICD-10-CM | POA: Insufficient documentation

## 2024-01-20 DIAGNOSIS — Z955 Presence of coronary angioplasty implant and graft: Secondary | ICD-10-CM | POA: Diagnosis not present

## 2024-01-20 DIAGNOSIS — I83019 Varicose veins of right lower extremity with ulcer of unspecified site: Secondary | ICD-10-CM

## 2024-01-20 DIAGNOSIS — Z7984 Long term (current) use of oral hypoglycemic drugs: Secondary | ICD-10-CM | POA: Insufficient documentation

## 2024-01-20 DIAGNOSIS — I472 Ventricular tachycardia, unspecified: Secondary | ICD-10-CM | POA: Insufficient documentation

## 2024-01-20 DIAGNOSIS — I493 Ventricular premature depolarization: Secondary | ICD-10-CM | POA: Insufficient documentation

## 2024-01-20 DIAGNOSIS — Z7189 Other specified counseling: Secondary | ICD-10-CM

## 2024-01-20 DIAGNOSIS — Z95810 Presence of automatic (implantable) cardiac defibrillator: Secondary | ICD-10-CM | POA: Diagnosis not present

## 2024-01-20 DIAGNOSIS — L97919 Non-pressure chronic ulcer of unspecified part of right lower leg with unspecified severity: Secondary | ICD-10-CM

## 2024-01-20 DIAGNOSIS — I4821 Permanent atrial fibrillation: Secondary | ICD-10-CM | POA: Insufficient documentation

## 2024-01-20 LAB — BRAIN NATRIURETIC PEPTIDE: B Natriuretic Peptide: 410.1 pg/mL — ABNORMAL HIGH (ref 0.0–100.0)

## 2024-01-20 LAB — BASIC METABOLIC PANEL WITH GFR
Anion gap: 12 (ref 5–15)
BUN: 22 mg/dL (ref 8–23)
CO2: 30 mmol/L (ref 22–32)
Calcium: 9.2 mg/dL (ref 8.9–10.3)
Chloride: 95 mmol/L — ABNORMAL LOW (ref 98–111)
Creatinine, Ser: 1.63 mg/dL — ABNORMAL HIGH (ref 0.61–1.24)
GFR, Estimated: 46 mL/min — ABNORMAL LOW (ref >60)
Glucose, Bld: 117 mg/dL — ABNORMAL HIGH (ref 70–99)
Potassium: 3.9 mmol/L (ref 3.5–5.1)
Sodium: 137 mmol/L (ref 135–145)

## 2024-01-20 MED ORDER — DOXYCYCLINE HYCLATE 100 MG PO TABS: 100.0000 mg | ORAL_TABLET | Freq: Two times a day (BID) | ORAL | 0 refills | Status: DC

## 2024-01-20 MED ORDER — POTASSIUM CHLORIDE CRYS ER 20 MEQ PO TBCR: 20.0000 meq | EXTENDED_RELEASE_TABLET | Freq: Every day | ORAL | 3 refills | Status: DC

## 2024-01-20 MED ORDER — TORSEMIDE 20 MG PO TABS
60.0000 mg | ORAL_TABLET | Freq: Every day | ORAL | 3 refills | Status: AC
Start: 2024-01-20 — End: ?

## 2024-01-20 NOTE — Addendum Note (Signed)
 Encounter addended by: Glena Harlene HERO, FNP on: 01/20/2024 3:38 PM  Actions taken: Clinical Note Signed

## 2024-01-20 NOTE — Telephone Encounter (Signed)
 Copied from CRM #[id number]. Topic: Clinical - Prescription Issue >> Jan 20, 2024 11:59 AM Darshell M wrote: Reason for CRM: Patient has not received prescription medications. Power of Gabriella is [name] Muzzy [phone]. Patient inquiring his ALPRAZolam  (XANAX ) 1 MG tablet refilled. Refill request made on 01/15/2024. Patient CB# [phone].

## 2024-01-20 NOTE — Progress Notes (Addendum)
 ADVANCED HF CLINIC NOTE   Primary Care: Grooms, [place], [place] EP: Danelle Birmingham, MD HF Cardiologist: Dr. Rolan  HPI: 68 y.o. male with a hx of chronic systolic heart failure (EF 10-15% in 05/2017, 35-40% on last echo 4/25), VT s/p ICD placement 07/2017, CAD s/p PCI (1996) and 4v CABG (2002) with Cath 2019 at [facility] 4/4 patent bypass grafts; 40 - 60% stenosis in body of SVG to OM-2 patent LIMA-LAD, SVG-OM2/OM3 with med mgmt), HTN, HLD, permanent atrial fibrillation on apixaban , asthma, anxiety disorder, chronic pain, opioid dependent, GERD. Also w/ h/o poor med compliance.    Admitted 10/25 with a/c HF noted to have an episode of sustained VT treated w/ ATP, converted in Afib w/ low 100s.  Rhytym strip shows rate 131 bpm.  He was started on IV amiodarone  and diuresed with IV lasix . Echo showed EF 30-35%, RV not well visualized. On 12/15/23, had VT treated with ATP, resulted in LOC and fall. Mexiletine started. Underwent R/LHC showing patent bypass grafts, severe native disease, severe BiV failure with severely reduced CO w/ elevated filling pressures; Toprol  stopped. Seen by PMT, elected for full code. GDMT titrated and he was discharged home, weight 244 lbs.  Today he returns for HF follow up with his neighbor, who helps manage his medications. Overall feeling fine. He is not SOB walking on flat ground with his walker. He is working with [facility] PT. Denies palpitations, abnormal bleeding, CP, dizziness, edema, or PND/Orthopnea. Appetite ok. Weight at home 248 pounds. Taking all medications. Lives alone, neighbor discloses privately that he has been verbally abuse to PCP would would not increase his benzo Rx. He has LE wounds 2/2 from spilling hot grease on his legs.   ECG (personally reviewed): AF with PVCs  Device interrogation (personally reviewed): none ordered today  Labs (10/25): K 3.7, creatinine 1.66 Labs 911/25): K 3.6, creatinine 1.40   Cardiac Studies - R/LHC 10/25: patent bypass grafts;  RA 18, PA 63/25 (48), PCWP 24 (v=34), CO/CI (Fick) 3.8/1.6, PVR 6.4 WU, PAPi 2.1 - Echo 10/25: EF 30-35%, RV not well visualized - Echo 4/25: EF 35-40%, RV not well visualized.    Past Medical History:  Diagnosis Date   A-fib (HCC)    AICD (automatic cardioverter/defibrillator) present    Anemia    Asthma    CAD (coronary artery disease)    CHF (congestive heart failure) (HCC)    Diabetes mellitus without complication (HCC)    Dyslipidemia    Elevated cholesterol    Hypertension    Current Outpatient Medications  Medication Sig Dispense Refill   albuterol  (VENTOLIN  HFA) 108 (90 Base) MCG/ACT inhaler Inhale 2 puffs into the lungs every 6 (six) hours as needed for wheezing or shortness of breath. 8 g 1   ALPRAZolam  (XANAX ) 1 MG tablet Take 1 tablet (1 mg total) by mouth at bedtime as needed for anxiety. 30 tablet 0   amiodarone  (PACERONE ) 200 MG tablet Take 1 tablet (200 mg total) by mouth 2 (two) times daily. 180 tablet 3   Carboxymethylcellulose Sodium (ARTIFICIAL TEARS OP) Place 1 drop into both eyes 3 (three) times daily.     citalopram  (CELEXA ) 20 MG tablet Take 20 mg by mouth daily.     Digoxin  62.5 MCG TABS Take 1 tablet (0.0625mg  total) by mouth daily. 30 tablet 3   ELIQUIS  5 MG TABS tablet TAKE ONE TABLET BY MOUTH TWICE DAILY 120 tablet 0   ezetimibe  (ZETIA ) 10 MG tablet Take 10 mg by mouth daily.  fluticasone  (FLONASE ) 50 MCG/ACT nasal spray Place 1 spray into both nostrils daily.     fluticasone -salmeterol (ADVAIR) 250-50 MCG/ACT AEPB Inhale 1 puff into the lungs in the morning and at bedtime. (Patient taking differently: Inhale 1 puff into the lungs daily.)     JARDIANCE  10 MG TABS tablet Take 10 mg by mouth daily.     lansoprazole (PREVACID) 30 MG capsule Take 30 mg by mouth daily.       losartan  (COZAAR ) 25 MG tablet Take 0.5 tablets (12.5 mg total) by mouth daily. 45 tablet 3   metoprolol  succinate (TOPROL -XL) 25 MG 24 hr tablet Take 1 tablet (25 mg total) by mouth  daily. 30 tablet 2   mexiletine (MEXITIL ) 150 MG capsule Take 1 capsule (150 mg total) by mouth every 12 (twelve) hours. 60 capsule 2   Omega-3 Fatty Acids (FISH OIL) 1200 MG CAPS Take 1,200 mg by mouth daily.     oxyCODONE -acetaminophen  (PERCOCET) 10-325 MG tablet Take 1 tablet by mouth every 8 (eight) hours as needed. 24 tablet 0   rosuvastatin  (CRESTOR ) 40 MG tablet TAKE ONE (1) TABLET EACH DAY 90 tablet 0   spironolactone  (ALDACTONE ) 25 MG tablet Take 1 tablet (25 mg total) by mouth daily. 30 tablet 3   tamsulosin  (FLOMAX ) 0.4 MG CAPS capsule Take 1 capsule (0.4 mg total) by mouth daily. 30 capsule 3   tiZANidine  (ZANAFLEX ) 4 MG tablet Take 4 mg by mouth every 8 (eight) hours as needed for muscle spasms.     torsemide  (DEMADEX ) 20 MG tablet Take 2 tablets (40 mg total) by mouth daily. 60 tablet 3   No current facility-administered medications for this encounter.   Allergies  Allergen Reactions   Xarelto  [Rivaroxaban ] Other (See Comments)    Bleeding ulcers   Codeine Hives and Itching   Lasix  [Furosemide ] Other (See Comments)    Patient says he gets light headed and dizzy.    Vioxx [Rofecoxib] Palpitations and Other (See Comments)    Caused massive heart attack   Social History   Socioeconomic History   Marital status: Divorced    Spouse name: Not on file   Number of children: Not on file   Years of education: Not on file   Highest education level: 10th grade  Occupational History   Occupation: Retired  Tobacco Use   Smoking status: Never   Smokeless tobacco: Never  Vaping Use   Vaping status: Never Used  Substance and Sexual Activity   Alcohol  use: Never   Drug use: Never    Comment: marijuana   Sexual activity: Not on file  Other Topics Concern   Not on file  Social History Narrative   ** Merged History Encounter **       Social Drivers of Health   Financial Resource Strain: High Risk (01/16/2024)   Overall Financial Resource Strain (CARDIA)    Difficulty of  Paying Living Expenses: Hard  Food Insecurity: Food Insecurity Present (01/16/2024)   Hunger Vital Sign    Worried About Running Out of Food in the Last Year: Sometimes true    Ran Out of Food in the Last Year: Often true  Transportation Needs: No Transportation Needs (01/01/2024)   PRAPARE - Administrator, Civil Service (Medical): No    Lack of Transportation (Non-Medical): No  Physical Activity: Not on file  Stress: Not on file  Social Connections: Moderately Isolated (01/16/2024)   Social Connection and Isolation Panel    Frequency of Communication with  Friends and Family: Three times a week    Frequency of Social Gatherings with Friends and Family: Once a week    Attends Religious Services: 1 to 4 times per year    Active Member of [employer] or Organizations: No    Attends Banker Meetings: Never    Marital Status: Divorced  Catering Manager Violence: Not At Risk (01/01/2024)   Humiliation, Afraid, Rape, and Kick questionnaire    Fear of Current or Ex-Partner: No    Emotionally Abused: No    Physically Abused: No    Sexually Abused: No   Family History  Problem Relation Age of Onset   Heart attack Father    Wt Readings from Last 3 Encounters:  01/20/24 112.5 kg (248 lb)  01/14/24 115.3 kg (254 lb 4 oz)  12/30/23 110.1 kg (242 lb 12.8 oz)   BP 122/84   Pulse 65   Ht 5' 9 (1.753 m)   Wt 112.5 kg (248 lb)   SpO2 95%   BMI 36.62 kg/m   PHYSICAL EXAM: General:  NAD. No resp difficulty, walked into clinic with rolling walker, chronically-ill appearing HEENT: Normal Neck: Supple. JVP 10 Cor: Regular rate & rhythm. No rubs, gallops or murmurs. Lungs: Clear Abdomen: Soft, obese, nontender, nondistended.  Extremities: No cyanosis, clubbing, rash, 1+ BLE edema; quarter-sized wounds to bilateral medial ankle, mild serous drainage with erythema  Neuro: Alert & oriented x 3, moves all 4 extremities w/o difficulty. Flight of ideas  ASSESSMENT & PLAN: 1.  Chronic systolic CHF: Ischemic cardiomyopathy.  Medtronic ICD.  He has been on minimal GDMT at home, apparently limited by low BP.  Patient has a long-standing cardiomyopathy, echoes with EF in the 20-25% range since at least 2015.  By Dr. Rolan review of echo this admission, EF 25% with moderate RV dysfunction and dilated IVC. R/LHC 10/22: Patent bypass grafts, severe BiV failure with elevated filling pressures, Fick CI 1.6, PAPi 2.1. He has end-stage HF. He is not a good candidate for advanced therapies, Will continue medical management. Seems improved NYHA IIb-III, he is midlly volume overloaded today.  - Increase torsemide  to 60 mg daily,a dd 20 KCL daily. BMET/BNP today, repeat BMET in 10-14 days - Continue losartan  12.5 mg daily. - Continue spiro 25 mg daily - Continue Toprol  XL 25 mg daily (would not increase any higher with low-output HF) - Continue Jardiance  10 mg daily - Continue digoxin  0.0625. Dig level < 0.6 (12/30/23) 2. VT: Patient has permanent AF but was noted to have a run of VT this admission treated with ATP (dual tachycardia).  Seen by EP. He was stated on amiodarone . Trigger for VT may have been drastic drop recently in Toprol  XL dose 200 mg daily => 12.5 daily.   - Another run of VT 12/15/23 w/ syncopal event, looks like treated with ATP (initiated with PMVT) - Previously Toprol  XL 200 mg daily, decreased to 25 mg daily (would not increase any further with low-output HF - No recurrent VT.  - Continue amiodarone  per EP, and mexiletine 150 mg bid.  - He has EP follow up next month 3. Atrial fibrillation: Permanent.  He is currently rate-controlled. V paced on ECG today. - Continue apixaban  5 mg bid. No bleeding issues.  - Continue Toprol  XL, as above 4. Hx of AKI: Creatinine jumped 1.09 => 1.86 => 2.02 with elevated K.  He received Lokelma .   - Now on Flomax  for suspected BPH.  - Most recent SCr  1.40.  Repeat BMET today 5. CAD: H/o CABG 2002.  Cath in 2013 with occluded LAD  and RCA, 2 occluded distal OMs. Cath 10/25 showed LIMA - LAD, SVG -Diag, SVG - OM2 -OM3 are patent. No chest pain - No aspirin  with need for anticoagulation - Continue Zetia  and Crestor . 6. LE wounds: neighbor discloses patient spilled hot grease from stove on his legs. - Will give doxycycline  100 mg bid x 7 days. - Keep area clean, OK to use vaseline and cover with bandage - he is requesting referral to Podiatry, will arrange. 7. GOC: Lives alone, neighbors help with medications and transportation. Elected for full code status. - Followed by [facility], has HH RN and PT. - Neighbor worried about his safety and ability to live alone. Unfortunately not much we can do as he has capacity to make his own decisions.  Follow up in 3 months with Dr. Rolan.  Harlene Gainer, FNP-BC 01/20/24

## 2024-01-20 NOTE — Patient Instructions (Signed)
 Medication Changes:  START DOXYCYCLINE  100MG  TWICE DAILY FOR 7 DAYS   INCREASE TORSEMIDE  TO 60MG  ONCE DAILY   INCREASE POTASSIUM TO ONCE DAILY   Lab Work:  Labs done today, your results will be available in MyChart, we will contact you for abnormal readings.  THEN PLEASE RETURN FOR LABS IN 2 WEEKS AT [facility] IN [place]---- PLEASE GO ON 12/8  Referrals:  YOU HAVE BEEN REFERRED TO PODIATRY THEY WILL REACH OUT TO YOU OR CALL TO ARRANGE THIS. PLEASE CALL US  WITH ANY CONCERNS   Follow-Up in: 3 MONS WITH DR. ROLAN PLEASE CALL OUR OFFICE AROUND JANUARY TO GET SCHEDULED FOR YOUR APPOINTMENT. PHONE NUMBER IS [phone] OPTION 2   At the [facility], you and your health needs are our priority. We have a designated team specialized in the treatment of Heart Failure. This Care Team includes your primary Heart Failure Specialized Cardiologist (physician), Advanced Practice Providers (APPs- Physician Assistants and Nurse Practitioners), and Pharmacist who all work together to provide you with the care you need, when you need it.   You may see any of the following providers on your designated Care Team at your next follow up:  Douglas [name] Douglas [name] Dr. Odis Brownie Greig Mosses, Douglas [name] Douglas [name], Douglas [name], Douglas Beckey Coe, Douglas [name] Douglas Lee, Douglas [name] Douglas [name], PharmD   Please be sure to bring in all your medications bottles to every appointment.   Need to Contact Us :  If you have any questions or concerns before your next appointment please send us  a message through [place] or call our office at [phone].    TO LEAVE A MESSAGE FOR THE NURSE SELECT OPTION 2, PLEASE LEAVE A MESSAGE INCLUDING: YOUR NAME DATE OF BIRTH CALL BACK NUMBER REASON FOR CALL**this is important as we prioritize the call backs  YOU WILL RECEIVE A CALL BACK THE SAME DAY AS LONG AS YOU CALL BEFORE 4:00 PM

## 2024-01-21 ENCOUNTER — Ambulatory Visit (HOSPITAL_COMMUNITY): Payer: Self-pay | Admitting: Family Medicine

## 2024-01-27 ENCOUNTER — Telehealth: Payer: Self-pay | Admitting: *Deleted

## 2024-01-27 NOTE — Telephone Encounter (Unsigned)
 Copied from CRM #[id number]. Topic: Clinical - Medical Advice >> Jan 27, 2024  1:37 PM Charolett L wrote: Reason for CRM: [place] [facility] requesting a discharge from PT for leaving inappropriate messages and behavior. He would also like a call back to verify the discharge was approved CB # [phone]

## 2024-01-28 ENCOUNTER — Emergency Department (HOSPITAL_COMMUNITY)

## 2024-01-28 ENCOUNTER — Other Ambulatory Visit (HOSPITAL_COMMUNITY): Payer: Self-pay

## 2024-01-28 ENCOUNTER — Other Ambulatory Visit: Payer: Self-pay

## 2024-01-28 ENCOUNTER — Inpatient Hospital Stay (HOSPITAL_COMMUNITY)
Admission: EM | Admit: 2024-01-28 | Discharge: 2024-02-03 | DRG: 513 | Disposition: A | Discharge status: Skilled Nursing Facility | Attending: Internal Medicine | Admitting: Internal Medicine

## 2024-01-28 ENCOUNTER — Encounter (HOSPITAL_COMMUNITY): Payer: Self-pay

## 2024-01-28 ENCOUNTER — Other Ambulatory Visit: Payer: Self-pay | Admitting: Physician Assistant

## 2024-01-28 DIAGNOSIS — N1831 Chronic kidney disease, stage 3a: Secondary | ICD-10-CM | POA: Diagnosis present

## 2024-01-28 DIAGNOSIS — S63289A Dislocation of proximal interphalangeal joint of unspecified finger, initial encounter: Secondary | ICD-10-CM | POA: Diagnosis present

## 2024-01-28 DIAGNOSIS — S61209A Unspecified open wound of unspecified finger without damage to nail, initial encounter: Principal | ICD-10-CM

## 2024-01-28 DIAGNOSIS — J4540 Moderate persistent asthma, uncomplicated: Secondary | ICD-10-CM | POA: Diagnosis present

## 2024-01-28 DIAGNOSIS — M545 Low back pain, unspecified: Secondary | ICD-10-CM

## 2024-01-28 DIAGNOSIS — S8992XA Unspecified injury of left lower leg, initial encounter: Secondary | ICD-10-CM | POA: Diagnosis present

## 2024-01-28 DIAGNOSIS — F330 Major depressive disorder, recurrent, mild: Secondary | ICD-10-CM | POA: Diagnosis present

## 2024-01-28 DIAGNOSIS — F411 Generalized anxiety disorder: Secondary | ICD-10-CM | POA: Diagnosis present

## 2024-01-28 DIAGNOSIS — I5022 Chronic systolic (congestive) heart failure: Secondary | ICD-10-CM | POA: Diagnosis present

## 2024-01-28 DIAGNOSIS — R4189 Other symptoms and signs involving cognitive functions and awareness: Secondary | ICD-10-CM

## 2024-01-28 DIAGNOSIS — I2510 Atherosclerotic heart disease of native coronary artery without angina pectoris: Secondary | ICD-10-CM | POA: Diagnosis present

## 2024-01-28 DIAGNOSIS — I10 Essential (primary) hypertension: Secondary | ICD-10-CM | POA: Diagnosis present

## 2024-01-28 DIAGNOSIS — I482 Chronic atrial fibrillation, unspecified: Secondary | ICD-10-CM | POA: Diagnosis present

## 2024-01-28 LAB — BASIC METABOLIC PANEL WITH GFR
Anion gap: 16 — ABNORMAL HIGH (ref 5–15)
BUN: 40 mg/dL — ABNORMAL HIGH (ref 8–23)
CO2: 26 mmol/L (ref 22–32)
Calcium: 9.4 mg/dL (ref 8.9–10.3)
Chloride: 95 mmol/L — ABNORMAL LOW (ref 98–111)
Creatinine, Ser: 1.74 mg/dL — ABNORMAL HIGH (ref 0.61–1.24)
GFR, Estimated: 42 mL/min — ABNORMAL LOW (ref >60)
Glucose, Bld: 111 mg/dL — ABNORMAL HIGH (ref 70–99)
Potassium: 3.8 mmol/L (ref 3.5–5.1)
Sodium: 137 mmol/L (ref 135–145)

## 2024-01-28 LAB — CBC WITH DIFFERENTIAL/PLATELET
Abs Immature Granulocytes: 0.02 K/uL (ref 0.00–0.07)
Basophils Absolute: 0 K/uL (ref 0.0–0.1)
Basophils Relative: 1 %
Eosinophils Absolute: 0 K/uL (ref 0.0–0.5)
Eosinophils Relative: 1 %
HCT: 51.5 % (ref 39.0–52.0)
Hemoglobin: 16.1 g/dL (ref 13.0–17.0)
Immature Granulocytes: 0 %
Lymphocytes Relative: 14 %
Lymphs Abs: 1.2 K/uL (ref 0.7–4.0)
MCH: 26.7 pg (ref 26.0–34.0)
MCHC: 31.3 g/dL (ref 30.0–36.0)
MCV: 85.5 fL (ref 80.0–100.0)
Monocytes Absolute: 0.8 K/uL (ref 0.1–1.0)
Monocytes Relative: 10 %
Neutro Abs: 6.5 K/uL (ref 1.7–7.7)
Neutrophils Relative %: 74 %
Platelets: 309 K/uL (ref 150–400)
RBC: 6.02 MIL/uL — ABNORMAL HIGH (ref 4.22–5.81)
RDW: 22.6 % — ABNORMAL HIGH (ref 11.5–15.5)
WBC: 8.6 K/uL (ref 4.0–10.5)
nRBC: 0 % (ref 0.0–0.2)

## 2024-01-28 MED ORDER — HYDROMORPHONE HCL 1 MG/ML IJ SOLN
1.0000 mg | Freq: Once | INTRAMUSCULAR | Status: AC
  Administered 2024-01-28: 1 mg via INTRAVENOUS
  Filled 2024-01-28: qty 1

## 2024-01-28 MED ORDER — LIDOCAINE HCL (PF) 1 % IJ SOLN
10.0000 mL | Freq: Once | INTRAMUSCULAR | Status: AC
  Administered 2024-01-28: 10 mL via INTRADERMAL
  Filled 2024-01-28: qty 10

## 2024-01-28 MED ORDER — CEFAZOLIN SODIUM-DEXTROSE 2-4 GM/100ML-% IV SOLN
2.0000 g | Freq: Once | INTRAVENOUS | Status: AC
  Administered 2024-01-28: 2 g via INTRAVENOUS
  Filled 2024-01-28: qty 100

## 2024-01-28 MED ORDER — IOHEXOL 350 MG/ML SOLN
75.0000 mL | Freq: Once | INTRAVENOUS | Status: AC | PRN
  Administered 2024-01-28: 75 mL via INTRAVENOUS

## 2024-01-28 MED ORDER — HYDROMORPHONE HCL 1 MG/ML IJ SOLN
0.5000 mg | Freq: Once | INTRAMUSCULAR | Status: AC
  Administered 2024-01-28: 0.5 mg via INTRAVENOUS
  Filled 2024-01-28: qty 1

## 2024-01-28 NOTE — Plan of Care (Addendum)
 Discussed patient with ER provider.  Patient will require washout and bedside reduction PIP dislocation.  Ancef  given in ED setting, no acute fracture identified on XR.   After attempted reduction bedside, joint continues to remain unstable. Patient will require formal reduction and pinning in operating room.  Plan to bring patient in under medical service for medical clearance, plan for OR in am for R index finger reduction and stabilization of PIP index finger dislocation.    Jazilyn Siegenthaler, MD Hand Surgery, [place]

## 2024-01-28 NOTE — Patient Instructions (Signed)

## 2024-01-28 NOTE — ED Triage Notes (Signed)
 Pt BIB EMS from home after fall after bending over to pick something up. Right index finger has open dislocation. Pt complaining of right arm pain. Denies hitting head and LOC.

## 2024-01-28 NOTE — ED Notes (Signed)
 Patient transported to X-ray

## 2024-01-28 NOTE — ED Notes (Signed)
 Patient transported to CT

## 2024-01-28 NOTE — ED Provider Notes (Incomplete)
 [place] EMERGENCY DEPARTMENT AT [facility] Provider Note   CSN: [id number] Arrival date & time: 01/28/24  1719     Patient presents with: Open Dislocation (finger)    Douglas [name] is a 68 y.o. male.  68 year old male presents to the ED with complaints of mechanical fall tonight.  Patient sustained an open fracture to his right index finger, pain to his left knee, and pain to his back.  Patient reports this is mechanical in nature because he slipped and did not experience any dizziness, chest pain, shortness of breath, blurred vision, visual changes or syncope prior to or after the event.  Patient is on Eliquis  and reports hitting the back of his head.  Patient has significant history of CHF, anemia, diabetes, A-fib, hypertension, asthma, CAD, AICD.  Patient currently denies any chest pain or shortness of breath and denies any abdominal pain.  He reports he is usually able to ambulate throughout his home with minimal assistance of walker and he feels safe at home.     Prior to Admission medications   Medication Sig Start Date End Date Taking? Authorizing Provider  albuterol  (VENTOLIN  HFA) 108 (90 Base) MCG/ACT inhaler Inhale 2 puffs into the lungs every 6 (six) hours as needed for wheezing or shortness of breath. 11/22/23   Grooms, Courtney, PA-C  ALPRAZolam  (XANAX ) 1 MG tablet Take 1 tablet (1 mg total) by mouth at bedtime as needed for anxiety. 12/30/23   Grooms, Courtney, PA-C  amiodarone  (PACERONE ) 200 MG tablet Take 1 tablet (200 mg total) by mouth 2 (two) times daily. 01/01/24   [place], Harlene HERO, FNP  Carboxymethylcellulose Sodium (ARTIFICIAL TEARS OP) Place 1 drop into both eyes 3 (three) times daily.    [provider]  citalopram  (CELEXA ) 20 MG tablet Take 20 mg by mouth daily. 04/05/17   [provider]  Digoxin  62.5 MCG TABS Take 1 tablet (0.0625mg  total) by mouth daily. 12/24/23   Samtani, Jai-Gurmukh, MD  doxycycline  (VIBRA -TABS) 100 MG tablet  Take 1 tablet (100 mg total) by mouth 2 (two) times daily. 01/20/24   Glena Harlene HERO, FNP  ELIQUIS  5 MG TABS tablet TAKE ONE TABLET BY MOUTH TWICE DAILY 08/28/18   Lavona Agent, MD  ezetimibe  (ZETIA ) 10 MG tablet Take 10 mg by mouth daily. 06/01/21   [provider]  fluticasone  (FLONASE ) 50 MCG/ACT nasal spray Place 1 spray into both nostrils daily.    [provider]  fluticasone -salmeterol (ADVAIR) 250-50 MCG/ACT AEPB Inhale 1 puff into the lungs in the morning and at bedtime. Patient taking differently: Inhale 1 puff into the lungs daily. 10/21/23   [provider]  JARDIANCE  10 MG TABS tablet Take 10 mg by mouth daily. 06/27/21   [provider]  lansoprazole (PREVACID) 30 MG capsule Take 30 mg by mouth daily.      [provider]  losartan  (COZAAR ) 25 MG tablet Take 0.5 tablets (12.5 mg total) by mouth daily. 12/30/23 03/29/24  Glena Harlene HERO, FNP  metoprolol  succinate (TOPROL -XL) 25 MG 24 hr tablet Take 1 tablet (25 mg total) by mouth daily. 12/24/23   Samtani, Jai-Gurmukh, MD  mexiletine (MEXITIL ) 150 MG capsule Take 1 capsule (150 mg total) by mouth every 12 (twelve) hours. 12/24/23   Samtani, Jai-Gurmukh, MD  Omega-3 Fatty Acids (FISH OIL) 1200 MG CAPS Take 1,200 mg by mouth daily.    [provider]  oxyCODONE -acetaminophen  (PERCOCET) 10-325 MG tablet Take 1 tablet by mouth every 8 (eight) hours as  needed. 01/14/24   Grooms, Courtney, PA-C  potassium chloride  SA (KLOR-CON  M) 20 MEQ tablet Take 1 tablet (20 mEq total) by mouth daily. 01/20/24   Glena Harlene HERO, FNP  rosuvastatin  (CRESTOR ) 40 MG tablet TAKE ONE (1) TABLET EACH DAY 10/18/20   Lavona Agent, MD  spironolactone  (ALDACTONE ) 25 MG tablet Take 1 tablet (25 mg total) by mouth daily. 12/24/23   Samtani, Jai-Gurmukh, MD  tamsulosin  (FLOMAX ) 0.4 MG CAPS capsule Take 1 capsule (0.4 mg total) by mouth daily. 12/24/23   Samtani, Jai-Gurmukh, MD  tiZANidine  (ZANAFLEX ) 4 MG tablet  Take 4 mg by mouth every 8 (eight) hours as needed for muscle spasms. 09/29/11   [provider]  torsemide  (DEMADEX ) 20 MG tablet Take 3 tablets (60 mg total) by mouth daily. 01/20/24   Glena Harlene HERO, FNP    Allergies: Xarelto  [rivaroxaban ], Codeine, Lasix  [furosemide ], and Vioxx [rofecoxib]    Review of Systems  Updated Vital Signs BP 134/63   Pulse 72   Temp 97.9 F (36.6 C) (Oral)   Resp 19   Ht 5' 9 (1.753 m)   Wt 108.9 kg   SpO2 99%   BMI 35.44 kg/m   Physical Exam  (all labs ordered are listed, but only abnormal results are displayed) Labs Reviewed - No data to display  EKG: None  Radiology: No results found.  .Laceration Repair  Date/Time: 01/28/2024 11:11 PM  Performed by: Myriam Fonda RAMAN, PA-C Authorized by: Myriam Fonda RAMAN, PA-C      Medications Ordered in the ED - No data to display  68 y.o. male presents to the ED with complaints of open fracture of right index finger, left knee pain, back pain following a mechanical fall. The differential diagnosis includes but not limited to intracranial bleed, fracture, open fracture with dislocation, arrhythmia, intra-abdominal bleed, (Ddx)  On arrival pt is nontoxic, vitals unremarkable. Exam significant for pain to palpation throughout the lumbar thoracic spine, pain to palpation of left knee, obvious open fracture of right index finger.  Patient is able to ambulate with minimal assistance to bathroom and denies any chest pain or dizziness.  Additional history obtained from ***. Previous records obtained and reviewed ***  I ordered medication Dilaudid  and Ancef  for pain control and infection prophylactics  Lab Tests:  I Ordered, reviewed, and interpreted labs, which included: BMP CBC  Imaging Studies ordered:  I ordered imaging studies which included CT head and cervical spine, CT chest abdomen pelvis, knee and hand x-ray.   ED Course:   Patient sitting comfortably in ED bed in no acute distress  nontoxic-appearing.  On initial evaluation patient is reporting pain in multiple locations but his main concern with the open fracture of his right index finger.  Patient is prescribed oxycodone  10 mg at home and will be trialed on Dilaudid  in the ED for pain management pending results of scan.  Patient agrees with treatment plan and does not have any questions at this time.  Hand surgery was consulted and Dr. Arlinda advised to reduce the finger and have him follow up in the office this week. Finger was unable to be reduced after several attempts Dr. Arlinda was re consulted for further recommendations.  He advised to wrap injury and admit to medicine for observation and presurgery clearance.  Patient will have wound wrapped and Cob[place] to the operating room in the morning.   Portions of this note were generated with Scientist, clinical (histocompatibility and immunogenetics). Dictation errors may occur despite best attempts at  proofreading.   Final diagnoses:  None    ED Discharge Orders     None

## 2024-01-28 NOTE — ED Provider Notes (Signed)
 [place] EMERGENCY DEPARTMENT AT [facility] Provider Note   CSN: [id number] Arrival date & time: 01/28/24  1719     Patient presents with: Open Dislocation (finger)    Douglas [name] is a 68 y.o. male.  68 year old male presents to the ED with complaints of mechanical fall tonight.  Patient sustained an open fracture to his right index finger, pain to his left knee, and pain to his back.  Patient reports this is mechanical in nature because he slipped and did not experience any dizziness, chest pain, shortness of breath, blurred vision, visual changes or syncope prior to or after the event.  Patient is on Eliquis  and reports hitting the back of his head.  Patient has significant history of CHF, anemia, diabetes, A-fib, hypertension, asthma, CAD, AICD.  Patient currently denies any chest pain or shortness of breath and denies any abdominal pain.  He reports he is usually able to ambulate throughout his home with minimal assistance of walker and he feels safe at home.     Prior to Admission medications   Medication Sig Start Date End Date Taking? Authorizing Provider  albuterol  (VENTOLIN  HFA) 108 (90 Base) MCG/ACT inhaler Inhale 2 puffs into the lungs every 6 (six) hours as needed for wheezing or shortness of breath. 11/22/23   Grooms, Courtney, PA-C  ALPRAZolam  (XANAX ) 1 MG tablet Take 1 tablet (1 mg total) by mouth at bedtime as needed for anxiety. 12/30/23   Grooms, Courtney, PA-C  amiodarone  (PACERONE ) 200 MG tablet Take 1 tablet (200 mg total) by mouth 2 (two) times daily. 01/01/24   Milford, Harlene HERO, FNP  Carboxymethylcellulose Sodium (ARTIFICIAL TEARS OP) Place 1 drop into both eyes 3 (three) times daily.    [provider]  citalopram  (CELEXA ) 20 MG tablet Take 20 mg by mouth daily. 04/05/17   [provider]  Digoxin  62.5 MCG TABS Take 1 tablet (0.0625mg  total) by mouth daily. 12/24/23   Samtani, Jai-Gurmukh, MD  doxycycline  (VIBRA -TABS) 100 MG tablet  Take 1 tablet (100 mg total) by mouth 2 (two) times daily. 01/20/24   Milford, Harlene HERO, FNP  ELIQUIS  5 MG TABS tablet TAKE ONE TABLET BY MOUTH TWICE DAILY 08/28/18   Lavona Agent, MD  ezetimibe  (ZETIA ) 10 MG tablet Take 10 mg by mouth daily. 06/01/21   [provider]  fluticasone  (FLONASE ) 50 MCG/ACT nasal spray Place 1 spray into both nostrils daily.    [provider]  fluticasone -salmeterol (ADVAIR) 250-50 MCG/ACT AEPB Inhale 1 puff into the lungs in the morning and at bedtime. Patient taking differently: Inhale 1 puff into the lungs daily. 10/21/23   [provider]  JARDIANCE  10 MG TABS tablet Take 10 mg by mouth daily. 06/27/21   [provider]  lansoprazole (PREVACID) 30 MG capsule Take 30 mg by mouth daily.      [provider]  losartan  (COZAAR ) 25 MG tablet Take 0.5 tablets (12.5 mg total) by mouth daily. 12/30/23 03/29/24  Glena Harlene HERO, FNP  metoprolol  succinate (TOPROL -XL) 25 MG 24 hr tablet Take 1 tablet (25 mg total) by mouth daily. 12/24/23   Samtani, Jai-Gurmukh, MD  mexiletine (MEXITIL ) 150 MG capsule Take 1 capsule (150 mg total) by mouth every 12 (twelve) hours. 12/24/23   Samtani, Jai-Gurmukh, MD  Omega-3 Fatty Acids (FISH OIL) 1200 MG CAPS Take 1,200 mg by mouth daily.    [provider]  oxyCODONE -acetaminophen  (PERCOCET) 10-325 MG tablet Take 1 tablet by mouth every 8 (eight) hours as  needed. 01/14/24   Grooms, Courtney, PA-C  potassium chloride  SA (KLOR-CON  M) 20 MEQ tablet Take 1 tablet (20 mEq total) by mouth daily. 01/20/24   Glena Harlene HERO, FNP  rosuvastatin  (CRESTOR ) 40 MG tablet TAKE ONE (1) TABLET EACH DAY 10/18/20   Lavona Agent, MD  spironolactone  (ALDACTONE ) 25 MG tablet Take 1 tablet (25 mg total) by mouth daily. 12/24/23   Samtani, Jai-Gurmukh, MD  tamsulosin  (FLOMAX ) 0.4 MG CAPS capsule Take 1 capsule (0.4 mg total) by mouth daily. 12/24/23   Samtani, Jai-Gurmukh, MD  tiZANidine  (ZANAFLEX ) 4 MG tablet  Take 4 mg by mouth every 8 (eight) hours as needed for muscle spasms. 09/29/11   [provider]  torsemide  (DEMADEX ) 20 MG tablet Take 3 tablets (60 mg total) by mouth daily. 01/20/24   Glena Harlene HERO, FNP    Allergies: Xarelto  [rivaroxaban ], Codeine, Lasix  [furosemide ], and Vioxx [rofecoxib]    Review of Systems  Updated Vital Signs BP 134/63   Pulse 72   Temp 97.9 F (36.6 C) (Oral)   Resp 19   Ht 5' 9 (1.753 m)   Wt 108.9 kg   SpO2 99%   BMI 35.44 kg/m   Physical Exam  (all labs ordered are listed, but only abnormal results are displayed) Labs Reviewed - No data to display  EKG: None  Radiology: No results found.  Procedures   Medications Ordered in the ED - No data to display  68 y.o. male presents to the ED with complaints of open fracture of right index finger, left knee pain, back pain following a mechanical fall. The differential diagnosis includes but not limited to intracranial bleed, fracture, open fracture with dislocation, arrhythmia, intra-abdominal bleed, (Ddx)  On arrival pt is nontoxic, vitals unremarkable. Exam significant for pain to palpation throughout the lumbar thoracic spine, pain to palpation of left knee, obvious open fracture of right index finger.  Patient is able to ambulate with minimal assistance to bathroom and denies any chest pain or dizziness.  Additional history obtained from ***. Previous records obtained and reviewed ***  I ordered medication Dilaudid  and Ancef  for pain control and infection prophylactics  Lab Tests:  I Ordered, reviewed, and interpreted labs, which included: BMP CBC  Imaging Studies ordered:  I ordered imaging studies which included CT head and cervical spine, CT chest abdomen pelvis, knee and hand x-ray.   ED Course:   Patient sitting comfortably in ED bed in no acute distress nontoxic-appearing.  On initial evaluation patient is reporting pain in multiple locations but his main concern with the  open fracture of his right index finger.  Patient is prescribed oxycodone  10 mg at home and will be trialed on Dilaudid  in the ED for pain management pending results of scan.  Patient agrees with treatment plan and does not have any questions at this time.   Portions of this note were generated with Scientist, clinical (histocompatibility and immunogenetics). Dictation errors may occur despite best attempts at proofreading.   Final diagnoses:  None    ED Discharge Orders     None

## 2024-01-28 NOTE — Patient Outreach (Signed)
 Social Drivers of Health  Community Resource and Care Coordination Visit Note   01/28/2024  Name: Douglas [name] MRN: [id number] DOB:1956-01-27  Situation: Referral received for [facility] needs assessment and assistance related to Resources. I obtained verbal consent from [name].  Visit completed with Douglas [name] on the phone.   Background:      Assessment:   Goals Addressed             This Visit's Progress    BSW Goals       Current SDOH Barriers:  Financial constraints related to clothing and home repairs Housing barriers ADL IADL limitations Social Isolation Lacks knowledge of community resource: clothes, home health, Assisted Living Facility   Interventions: Referred patient to community resources  Advised patient to work with [name] Douglas [name] to connect with services.  Collaborated with primary care provider re: orders for Neurological Assessment* SW spoke to [facility] and informed patient has been making threats to providers office and DSS.  Patient went to court with [name] for threats to DSS employee and was continued until March 02, 2024.  SW discussed cognitive concerns and [name] is concerned that patient may be declining does not understand what he is doing.  SW agreed to communicate with the provider for a Neurological Assessment and [name] agreed to assist patient with attending the visit. SW staffed case with LCSW Douglas [name] and conference Provider.  Provider agreed to order Neurological evaluation.          Recommendation:   [name]/patient to work with provider on Neurological assessment   Follow Up Plan:   Patient has achieved all patient stated goals. [employer] will be closed. Patient has been provided contact information should new needs arise.   Douglas [name], BSW [name]  [facility], [facility] Social Worker Direct Dial: [phone]  Fax: [phone] Website: [email or web address]

## 2024-01-29 ENCOUNTER — Inpatient Hospital Stay (HOSPITAL_COMMUNITY)

## 2024-01-29 ENCOUNTER — Encounter (HOSPITAL_COMMUNITY)
Admission: EM | Disposition: A | Discharge status: Skilled Nursing Facility | Payer: Self-pay | Source: Home / Self Care | Attending: Internal Medicine

## 2024-01-29 ENCOUNTER — Inpatient Hospital Stay (HOSPITAL_COMMUNITY): Admitting: Anesthesiology

## 2024-01-29 ENCOUNTER — Encounter (HOSPITAL_COMMUNITY): Payer: Self-pay | Admitting: Family Medicine

## 2024-01-29 ENCOUNTER — Observation Stay (HOSPITAL_COMMUNITY)

## 2024-01-29 ENCOUNTER — Telehealth: Payer: Self-pay

## 2024-01-29 ENCOUNTER — Telehealth: Payer: Self-pay | Admitting: *Deleted

## 2024-01-29 DIAGNOSIS — S63289A Dislocation of proximal interphalangeal joint of unspecified finger, initial encounter: Secondary | ICD-10-CM

## 2024-01-29 DIAGNOSIS — S63280A Dislocation of proximal interphalangeal joint of right index finger, initial encounter: Secondary | ICD-10-CM

## 2024-01-29 DIAGNOSIS — I482 Chronic atrial fibrillation, unspecified: Secondary | ICD-10-CM

## 2024-01-29 DIAGNOSIS — S61200A Unspecified open wound of right index finger without damage to nail, initial encounter: Secondary | ICD-10-CM | POA: Diagnosis not present

## 2024-01-29 DIAGNOSIS — I2510 Atherosclerotic heart disease of native coronary artery without angina pectoris: Secondary | ICD-10-CM

## 2024-01-29 DIAGNOSIS — S8992XA Unspecified injury of left lower leg, initial encounter: Secondary | ICD-10-CM | POA: Diagnosis not present

## 2024-01-29 DIAGNOSIS — W010XXA Fall on same level from slipping, tripping and stumbling without subsequent striking against object, initial encounter: Secondary | ICD-10-CM | POA: Diagnosis not present

## 2024-01-29 DIAGNOSIS — J4540 Moderate persistent asthma, uncomplicated: Secondary | ICD-10-CM

## 2024-01-29 DIAGNOSIS — F330 Major depressive disorder, recurrent, mild: Secondary | ICD-10-CM

## 2024-01-29 DIAGNOSIS — Z23 Encounter for immunization: Secondary | ICD-10-CM | POA: Diagnosis present

## 2024-01-29 DIAGNOSIS — F411 Generalized anxiety disorder: Secondary | ICD-10-CM | POA: Diagnosis not present

## 2024-01-29 DIAGNOSIS — N1831 Chronic kidney disease, stage 3a: Secondary | ICD-10-CM

## 2024-01-29 DIAGNOSIS — S61209A Unspecified open wound of unspecified finger without damage to nail, initial encounter: Secondary | ICD-10-CM | POA: Diagnosis present

## 2024-01-29 DIAGNOSIS — I5022 Chronic systolic (congestive) heart failure: Secondary | ICD-10-CM

## 2024-01-29 HISTORY — PX: CLOSED REDUCTION FINGER WITH PERCUTANEOUS PINNING: SHX5612

## 2024-01-29 HISTORY — PX: OPEN REDUCTION INTERNAL FIXATION (ORIF) FINGER WITH RADIAL BONE GRAFT: SHX5666

## 2024-01-29 LAB — GLUCOSE, CAPILLARY
Glucose-Capillary: 119 mg/dL — ABNORMAL HIGH (ref 70–99)
Glucose-Capillary: 153 mg/dL — ABNORMAL HIGH (ref 70–99)

## 2024-01-29 LAB — SURGICAL PCR SCREEN
MRSA, PCR: NEGATIVE
Staphylococcus aureus: NEGATIVE

## 2024-01-29 SURGERY — OPEN REDUCTION INTERNAL FIXATION (ORIF) FINGER WITH RADIAL BONE GRAFT
Anesthesia: General | Site: Finger | Laterality: Right

## 2024-01-29 MED ORDER — MUPIROCIN 2 % EX OINT
1.0000 | TOPICAL_OINTMENT | Freq: Two times a day (BID) | CUTANEOUS | Status: AC
  Administered 2024-01-29 – 2024-02-02 (×9): 1 via NASAL
  Filled 2024-01-29 (×3): qty 22

## 2024-01-29 MED ORDER — OXYCODONE HCL 5 MG PO TABS: 5.0000 mg | ORAL_TABLET | Freq: Once | ORAL | Status: DC | PRN

## 2024-01-29 MED ORDER — CHLORHEXIDINE GLUCONATE 0.12 % MT SOLN
OROMUCOSAL | Status: AC
  Administered 2024-01-29: 15 mL via OROMUCOSAL
  Filled 2024-01-29: qty 15

## 2024-01-29 MED ORDER — CEFAZOLIN SODIUM-DEXTROSE 2-4 GM/100ML-% IV SOLN
2.0000 g | INTRAVENOUS | Status: AC
  Administered 2024-01-29: 2 g via INTRAVENOUS

## 2024-01-29 MED ORDER — MIDAZOLAM HCL (PF) 2 MG/2ML IJ SOLN
INTRAMUSCULAR | Status: DC | PRN
  Administered 2024-01-29: 1 mg via INTRAVENOUS

## 2024-01-29 MED ORDER — POTASSIUM CHLORIDE CRYS ER 20 MEQ PO TBCR
20.0000 meq | EXTENDED_RELEASE_TABLET | Freq: Every day | ORAL | Status: DC
  Administered 2024-01-29 – 2024-02-03 (×6): 20 meq via ORAL
  Filled 2024-01-29 (×6): qty 1

## 2024-01-29 MED ORDER — HYDROMORPHONE HCL 1 MG/ML IJ SOLN
0.5000 mg | INTRAMUSCULAR | Status: DC | PRN
  Administered 2024-01-29 – 2024-02-03 (×18): 0.5 mg via INTRAVENOUS
  Filled 2024-01-29 (×3): qty 0.5
  Filled 2024-01-29: qty 1
  Filled 2024-01-29 (×2): qty 0.5
  Filled 2024-01-29: qty 1
  Filled 2024-01-29 (×11): qty 0.5

## 2024-01-29 MED ORDER — ORAL CARE MOUTH RINSE: 15.0000 mL | Freq: Once | OROMUCOSAL | Status: AC

## 2024-01-29 MED ORDER — DIGOXIN 0.0625 MG HALF TABLET
0.0625 mg | ORAL_TABLET | Freq: Every day | ORAL | Status: DC
  Administered 2024-01-29 – 2024-02-03 (×6): 0.0625 mg via ORAL
  Filled 2024-01-29 (×7): qty 1

## 2024-01-29 MED ORDER — FENTANYL CITRATE (PF) 250 MCG/5ML IJ SOLN
INTRAMUSCULAR | Status: DC | PRN
  Administered 2024-01-29: 50 ug via INTRAVENOUS

## 2024-01-29 MED ORDER — PANTOPRAZOLE SODIUM 40 MG PO TBEC
40.0000 mg | DELAYED_RELEASE_TABLET | Freq: Every day | ORAL | Status: DC
  Administered 2024-01-29 – 2024-02-03 (×6): 40 mg via ORAL
  Filled 2024-01-29 (×6): qty 1

## 2024-01-29 MED ORDER — CITALOPRAM HYDROBROMIDE 20 MG PO TABS
20.0000 mg | ORAL_TABLET | Freq: Every day | ORAL | Status: DC
  Administered 2024-01-29 – 2024-02-03 (×6): 20 mg via ORAL
  Filled 2024-01-29 (×6): qty 1

## 2024-01-29 MED ORDER — ALPRAZOLAM 0.5 MG PO TABS
1.0000 mg | ORAL_TABLET | Freq: Every evening | ORAL | Status: DC | PRN
  Administered 2024-01-29 – 2024-02-02 (×4): 1 mg via ORAL
  Filled 2024-01-29 (×4): qty 2

## 2024-01-29 MED ORDER — ONDANSETRON HCL 4 MG/2ML IJ SOLN: 4.0000 mg | Freq: Once | INTRAMUSCULAR | Status: DC | PRN

## 2024-01-29 MED ORDER — PROCHLORPERAZINE EDISYLATE 10 MG/2ML IJ SOLN
5.0000 mg | Freq: Four times a day (QID) | INTRAMUSCULAR | Status: DC | PRN
  Administered 2024-01-29 – 2024-01-30 (×2): 5 mg via INTRAVENOUS
  Filled 2024-01-29 (×2): qty 2

## 2024-01-29 MED ORDER — HYDROMORPHONE HCL 1 MG/ML IJ SOLN
0.5000 mg | INTRAMUSCULAR | Status: DC | PRN
  Administered 2024-01-29 (×2): 0.5 mg via INTRAVENOUS
  Filled 2024-01-29 (×2): qty 1

## 2024-01-29 MED ORDER — TAMSULOSIN HCL 0.4 MG PO CAPS
0.4000 mg | ORAL_CAPSULE | Freq: Every day | ORAL | Status: DC
  Administered 2024-01-29 – 2024-02-03 (×6): 0.4 mg via ORAL
  Filled 2024-01-29 (×6): qty 1

## 2024-01-29 MED ORDER — ACETAMINOPHEN 500 MG PO TABS
1000.0000 mg | ORAL_TABLET | Freq: Once | ORAL | Status: AC
  Administered 2024-01-29: 1000 mg via ORAL
  Filled 2024-01-29: qty 2

## 2024-01-29 MED ORDER — PROPOFOL 10 MG/ML IV BOLUS
INTRAVENOUS | Status: DC | PRN
  Administered 2024-01-29: 40 mg via INTRAVENOUS
  Administered 2024-01-29: 70 mg via INTRAVENOUS

## 2024-01-29 MED ORDER — LIDOCAINE HCL 1 % IJ SOLN
INTRAMUSCULAR | Status: DC | PRN
  Administered 2024-01-29: 10 mL

## 2024-01-29 MED ORDER — METOPROLOL SUCCINATE ER 25 MG PO TB24
25.0000 mg | ORAL_TABLET | Freq: Every day | ORAL | Status: DC
  Administered 2024-01-29 – 2024-02-03 (×6): 25 mg via ORAL
  Filled 2024-01-29 (×6): qty 1

## 2024-01-29 MED ORDER — TORSEMIDE 20 MG PO TABS
60.0000 mg | ORAL_TABLET | Freq: Every day | ORAL | Status: DC
  Administered 2024-01-29 – 2024-02-03 (×6): 60 mg via ORAL
  Filled 2024-01-29 (×6): qty 3

## 2024-01-29 MED ORDER — SPIRONOLACTONE 25 MG PO TABS
25.0000 mg | ORAL_TABLET | Freq: Every day | ORAL | Status: DC
  Administered 2024-01-29 – 2024-02-03 (×6): 25 mg via ORAL
  Filled 2024-01-29 (×6): qty 1

## 2024-01-29 MED ORDER — PHENYLEPHRINE 80 MCG/ML (10ML) SYRINGE FOR IV PUSH (FOR BLOOD PRESSURE SUPPORT)
PREFILLED_SYRINGE | INTRAVENOUS | Status: DC | PRN
  Administered 2024-01-29: 160 ug via INTRAVENOUS

## 2024-01-29 MED ORDER — PROPOFOL 10 MG/ML IV BOLUS
INTRAVENOUS | Status: AC
  Filled 2024-01-29: qty 20

## 2024-01-29 MED ORDER — AMIODARONE HCL 200 MG PO TABS
200.0000 mg | ORAL_TABLET | Freq: Two times a day (BID) | ORAL | Status: DC
  Administered 2024-01-29 – 2024-02-03 (×11): 200 mg via ORAL
  Filled 2024-01-29 (×11): qty 1

## 2024-01-29 MED ORDER — ALBUTEROL SULFATE (2.5 MG/3ML) 0.083% IN NEBU: 2.5000 mg | INHALATION_SOLUTION | Freq: Four times a day (QID) | RESPIRATORY_TRACT | Status: DC | PRN

## 2024-01-29 MED ORDER — FLUTICASONE FUROATE-VILANTEROL 200-25 MCG/ACT IN AEPB
1.0000 | INHALATION_SPRAY | Freq: Every day | RESPIRATORY_TRACT | Status: DC
  Administered 2024-01-29 – 2024-02-03 (×6): 1 via RESPIRATORY_TRACT
  Filled 2024-01-29 (×2): qty 28

## 2024-01-29 MED ORDER — ACETAMINOPHEN 325 MG PO TABS
650.0000 mg | ORAL_TABLET | Freq: Four times a day (QID) | ORAL | Status: DC | PRN
  Administered 2024-01-31: 650 mg via ORAL
  Filled 2024-01-29 (×2): qty 2

## 2024-01-29 MED ORDER — LIDOCAINE 2% (20 MG/ML) 5 ML SYRINGE
INTRAMUSCULAR | Status: DC | PRN
  Administered 2024-01-29: 60 mg via INTRAVENOUS

## 2024-01-29 MED ORDER — OXYCODONE HCL 5 MG/5ML PO SOLN: 5.0000 mg | Freq: Once | ORAL | Status: DC | PRN

## 2024-01-29 MED ORDER — SENNA 8.6 MG PO TABS
1.0000 | ORAL_TABLET | Freq: Every day | ORAL | Status: DC | PRN
  Administered 2024-02-02: 8.6 mg via ORAL
  Filled 2024-01-29 (×2): qty 1

## 2024-01-29 MED ORDER — CHLORHEXIDINE GLUCONATE 0.12 % MT SOLN: 15.0000 mL | Freq: Once | OROMUCOSAL | Status: AC

## 2024-01-29 MED ORDER — CEFAZOLIN SODIUM-DEXTROSE 2-4 GM/100ML-% IV SOLN
2.0000 g | INTRAVENOUS | Status: AC
  Filled 2024-01-29: qty 100

## 2024-01-29 MED ORDER — LACTATED RINGERS IV SOLN: INTRAVENOUS | Status: DC

## 2024-01-29 MED ORDER — LIDOCAINE HCL (PF) 1 % IJ SOLN
INTRAMUSCULAR | Status: AC
  Filled 2024-01-29: qty 30

## 2024-01-29 MED ORDER — SODIUM CHLORIDE 0.9% FLUSH
3.0000 mL | Freq: Two times a day (BID) | INTRAVENOUS | Status: DC
  Administered 2024-01-29 – 2024-02-03 (×11): 3 mL via INTRAVENOUS

## 2024-01-29 MED ORDER — ROSUVASTATIN CALCIUM 20 MG PO TABS
40.0000 mg | ORAL_TABLET | Freq: Every day | ORAL | Status: DC
  Administered 2024-01-29 – 2024-02-03 (×6): 40 mg via ORAL
  Filled 2024-01-29 (×6): qty 2

## 2024-01-29 MED ORDER — MIDAZOLAM HCL 2 MG/2ML IJ SOLN
INTRAMUSCULAR | Status: AC
  Filled 2024-01-29: qty 2

## 2024-01-29 MED ORDER — FENTANYL CITRATE (PF) 100 MCG/2ML IJ SOLN
INTRAMUSCULAR | Status: AC
  Filled 2024-01-29: qty 2

## 2024-01-29 MED ORDER — OXYCODONE HCL 5 MG PO TABS
5.0000 mg | ORAL_TABLET | ORAL | Status: DC | PRN
  Administered 2024-01-29 – 2024-02-02 (×12): 5 mg via ORAL
  Filled 2024-01-29 (×14): qty 1

## 2024-01-29 MED ORDER — FENTANYL CITRATE (PF) 100 MCG/2ML IJ SOLN: 25.0000 ug | INTRAMUSCULAR | Status: DC | PRN

## 2024-01-29 MED ORDER — TETANUS-DIPHTH-ACELL PERTUSSIS 5-2-15.5 LF-MCG/0.5 IM SUSP
0.5000 mL | Freq: Once | INTRAMUSCULAR | Status: AC
  Administered 2024-01-29: 0.5 mL via INTRAMUSCULAR
  Filled 2024-01-29: qty 0.5

## 2024-01-29 MED ORDER — ONDANSETRON HCL 4 MG/2ML IJ SOLN
INTRAMUSCULAR | Status: DC | PRN
  Administered 2024-01-29: 4 mg via INTRAVENOUS

## 2024-01-29 MED ORDER — MEXILETINE HCL 150 MG PO CAPS
150.0000 mg | ORAL_CAPSULE | Freq: Two times a day (BID) | ORAL | Status: DC
  Administered 2024-01-29 – 2024-02-03 (×11): 150 mg via ORAL
  Filled 2024-01-29 (×13): qty 1

## 2024-01-29 SURGICAL SUPPLY — 48 items
BLADE SURG 15 STRL LF DISP TIS (BLADE) ×4 IMPLANT
BNDG COHESIVE 4X5 TAN STRL LF (GAUZE/BANDAGES/DRESSINGS) ×2 IMPLANT
BNDG COMPR ESMARK 4X3 LF (GAUZE/BANDAGES/DRESSINGS) ×2 IMPLANT
BNDG ELASTIC 3INX 5YD STR LF (GAUZE/BANDAGES/DRESSINGS) IMPLANT
BNDG ELASTIC 4INX 5YD STR LF (GAUZE/BANDAGES/DRESSINGS) ×4 IMPLANT
CHLORAPREP W/TINT 26 (MISCELLANEOUS) ×2 IMPLANT
CORD BIPOLAR FORCEPS 12FT (ELECTRODE) ×2 IMPLANT
COVER BACK TABLE 60X90IN (DRAPES) ×2 IMPLANT
CUFF TOURN SGL QUICK 18X4 (TOURNIQUET CUFF) IMPLANT
CUFF TRNQT CYL 24X4X16.5-23 (TOURNIQUET CUFF) ×2 IMPLANT
DRAPE OEC MINIVIEW 54X84 (DRAPES) ×2 IMPLANT
DRAPE SURG 17X23 STRL (DRAPES) ×2 IMPLANT
DRSG XEROFORM 1X8 (GAUZE/BANDAGES/DRESSINGS) IMPLANT
GAUZE PAD ABD 8X10 STRL (GAUZE/BANDAGES/DRESSINGS) ×2 IMPLANT
GAUZE SPONGE 4X4 12PLY STRL (GAUZE/BANDAGES/DRESSINGS) ×2 IMPLANT
GAUZE STRETCH 2X75IN STRL (MISCELLANEOUS) ×2 IMPLANT
GAUZE XEROFORM 1X8 LF (GAUZE/BANDAGES/DRESSINGS) ×2 IMPLANT
GLOVE BIO SURGEON STRL SZ7.5 (GLOVE) ×4 IMPLANT
GLOVE BIOGEL PI IND STRL 7.5 (GLOVE) ×4 IMPLANT
GOWN STRL REUS W/ TWL LRG LVL3 (GOWN DISPOSABLE) ×2 IMPLANT
GOWN STRL REUS W/ TWL XL LVL3 (GOWN DISPOSABLE) ×2 IMPLANT
GOWN STRL SURGICAL XL XLNG (GOWN DISPOSABLE) ×2 IMPLANT
KIT BASIN OR (CUSTOM PROCEDURE TRAY) ×2 IMPLANT
KWIRE DBL TROCAR .062X4 (WIRE) IMPLANT
MANIFOLD NEPTUNE II (INSTRUMENTS) ×2 IMPLANT
NDL HYPO 25X1 1.5 SAFETY (NEEDLE) IMPLANT
NDL HYPO 25X5/8 SAFETYGLIDE (NEEDLE) IMPLANT
NEEDLE HYPO 25X1 1.5 SAFETY (NEEDLE) IMPLANT
NEEDLE HYPO 25X5/8 SAFETYGLIDE (NEEDLE) IMPLANT
PACK ORTHO EXTREMITY (CUSTOM PROCEDURE TRAY) ×2 IMPLANT
PAD CAST 4YDX4 CTTN HI CHSV (CAST SUPPLIES) ×4 IMPLANT
PADDING CAST COTTON 6X4 STRL (CAST SUPPLIES) IMPLANT
PROTECTOR WIRE CHIROKLIP (ORTHOPEDIC DISPOSABLE SUPPLIES) IMPLANT
SHEET MEDIUM DRAPE 40X70 STRL (DRAPES) ×2 IMPLANT
SLEEVE SCD COMPRESS KNEE MED (STOCKING) IMPLANT
SOLN 0.9% NACL POUR BTL 1000ML (IV SOLUTION) IMPLANT
SPIKE FLUID TRANSFER (MISCELLANEOUS) IMPLANT
SPLINT PLASTER CAST XFAST 4X15 (CAST SUPPLIES) ×20 IMPLANT
STOCKINETTE IMPERVIOUS 9X36 MD (GAUZE/BANDAGES/DRESSINGS) ×2 IMPLANT
SUCTION TUBE FRAZIER 10FR DISP (SUCTIONS) IMPLANT
SUT ETHILON 4 0 PS 2 18 (SUTURE) ×2 IMPLANT
SUT MNCRL AB 3-0 PS2 27 (SUTURE) ×2 IMPLANT
SUT VIC AB 4-0 PS2 18 (SUTURE) IMPLANT
SYR BULB EAR ULCER 3OZ GRN STR (SYRINGE) ×4 IMPLANT
SYR CONTROL 10ML LL (SYRINGE) IMPLANT
TOWEL GREEN STERILE FF (TOWEL DISPOSABLE) ×4 IMPLANT
TUBE CONNECTING 20X1/4 (TUBING) IMPLANT
UNDERPAD 30X36 HEAVY ABSORB (UNDERPADS AND DIAPERS) ×2 IMPLANT

## 2024-01-29 NOTE — ED Notes (Signed)
 Pt states he has chronic left knee pain d/t a fall a long time ago.   RN provided pillow per pt request and alerted attending for pain medication.

## 2024-01-29 NOTE — ED Notes (Signed)
 Two RN adjusted pt in bed. Pt requested to remove blanket. Pt states his mouth feels dry. RN informed he is NPO but will check for chap stick.

## 2024-01-29 NOTE — Plan of Care (Signed)

## 2024-01-29 NOTE — Progress Notes (Signed)
    PROCEDURAL EXPEDITER PROGRESS NOTE  Patient Name: MUSAB WINGARD  DOB:1955-04-25 Date of Admission: 01/28/2024  Date of Assessment:01/29/24   -------------------------------------------------------------------------------------------------------------------   Brief clinical summary: 68 yr old Male with Hx of A-fib, AICD, DM, CAD, CHF, HTN having surgery on 01/29/2024 for Open Reduction of Interna. Fixation of right finger and radial bone graph  Orders in place:  Yes   Communication with surgical team if no orders: needs sonsent for surgery will reach out to surgeon  Labs, test, and orders reviewed: yes  Requires surgical clearance:  Yes  What type of clearance: surgical clearance   Clearance received: n/a  Barriers noted:hasn't f/u with Cardiology recently   Intervention provided by [facility] team: reach out to surgeon for consent orders  Barrier resolved:  no   -------------------------------------------------------------------------------------------------------------------  [facility] [employer], Ronal DELENA Bald Please contact us  directly via secure chat (search for [facility]) or by calling us  at [phone] [facility]).

## 2024-01-29 NOTE — CV Procedure (Signed)
 Patient is NOT safe for MRI at [facility] due to patient having an abandoned lead. This was confirmed by Charlies Arthur, PA-C today 01/29/24.

## 2024-01-29 NOTE — Telephone Encounter (Signed)
 Copied from CRM #[id number]. Topic: General - Other >> Jan 29, 2024 10:34 AM Tonda B wrote: Reason for CRM: [facility] calling needs update on patients discharge paperwork please call [phone] chris

## 2024-01-29 NOTE — Transfer of Care (Signed)
 Immediate Anesthesia Transfer of Care Note  Patient: Douglas [name]  Procedure(s) Performed: OPEN REDUCTION INTERNAL FIXATION (ORIF) FINGER WITH RADIAL BONE GRAFT (Right) CLOSED REDUCTION, FINGER, WITH PERCUTANEOUS PINNING (Right: Finger)  Patient Location: PACU  Anesthesia Type:General  Level of Consciousness: awake, alert , and oriented  Airway & Oxygen Therapy: Patient Spontanous Breathing  Post-op Assessment: Report given to RN, Post -op Vital signs reviewed and stable, and Patient moving all extremities X 4  Post vital signs: Reviewed and stable  Last Vitals:  Vitals Value Taken Time  BP    Temp    Pulse    Resp    SpO2      Last Pain:  Vitals:   01/29/24 1314  TempSrc:   PainSc: 9          Complications: No notable events documented.

## 2024-01-29 NOTE — ED Notes (Signed)
 Pt provided chap stick

## 2024-01-29 NOTE — ED Notes (Signed)
 Pt requested to sit on side of bed. RN assisted pt to side of bed with side table .

## 2024-01-29 NOTE — Consult Note (Signed)
 Reason for Consult:Left tibia plateau fx Referring Physician: Ivonne Mustache Time called: 1205 Time at bedside: 1221   Douglas [name] is an 68 y.o. male.  HPI: Douglas [name] was at home and slipped and fell. He had immediate left sided pain and could not get up. He was brought to the ED where workup showed a persistent PIP joint dislocation and was admitted for procedure the following day (today). Though x-rays were negative he continued to have pain and swelling of the left knee and CT showed a tibia plateau fx and orthopedic surgery was consulted. He lives at home alone and uses a RW to ambulate.  Past Medical History:  Diagnosis Date   A-fib [facility])    AICD (automatic cardioverter/defibrillator) present    Anemia    Asthma    CAD (coronary artery disease)    CHF (congestive heart failure) (HCC)    Diabetes mellitus without complication (HCC)    Dyslipidemia    Elevated cholesterol    Hypertension     Past Surgical History:  Procedure Laterality Date   CARDIAC CATHETERIZATION  1996   stent placement   CARDIAC DEFIBRILLATOR PLACEMENT     CORONARY ARTERY BYPASS GRAFT  2002   times 4   CORONARY ARTERY BYPASS GRAFT     EYE SURGERY     ICD GENERATOR CHANGEOUT N/A 09/30/2018   Procedure: ICD GENERATOR CHANGEOUT;  Surgeon: Waddell Danelle ORN, MD;  Location: [facility] INVASIVE CV LAB;  Service: Cardiovascular;  Laterality: N/A;   IMPLANTABLE CARDIOVERTER DEFIBRILLATOR (ICD) GENERATOR CHANGE N/A 09/10/2011   Procedure: ICD GENERATOR CHANGE;  Surgeon: Danelle ORN Waddell, MD;  Location: [facility] CATH LAB;  Service: Cardiovascular;  Laterality: N/A;   INSERT / REPLACE / REMOVE PACEMAKER     LEFT HEART CATHETERIZATION WITH CORONARY ANGIOGRAM N/A 08/29/2011   Procedure: LEFT HEART CATHETERIZATION WITH CORONARY ANGIOGRAM;  Surgeon: Peter M Jordan, MD;  Location: [facility] CATH LAB;  Service: Cardiovascular;  Laterality: N/A;   RIGHT/LEFT HEART CATH AND CORONARY/GRAFT ANGIOGRAPHY N/A 12/18/2023   Procedure: RIGHT/LEFT HEART CATH  AND CORONARY/GRAFT ANGIOGRAPHY;  Surgeon: Cherrie Toribio SAUNDERS, MD;  Location: [facility] INVASIVE CV LAB;  Service: Cardiovascular;  Laterality: N/A;    Family History  Problem Relation Age of Onset   Heart attack Father     Social History:  reports that he has never smoked. He has never used smokeless tobacco. He reports that he does not drink alcohol  and does not use drugs.  Allergies:  Allergies  Allergen Reactions   Xarelto  [Rivaroxaban ] Other (See Comments)    Bleeding ulcers   Codeine Hives and Itching   Lasix  [Furosemide ] Other (See Comments)    Patient says he gets light headed and dizzy.    Vioxx [Rofecoxib] Palpitations and Other (See Comments)    Caused massive heart attack    Medications: I have reviewed the patient's current medications.  Results for orders placed or performed during the hospital encounter of 01/28/24 (from the past 48 hours)  CBC with Differential     Status: Abnormal   Collection Time: 01/28/24  7:01 PM  Result Value Ref Range   WBC 8.6 4.0 - 10.5 K/uL   RBC 6.02 (H) 4.22 - 5.81 MIL/uL   Hemoglobin 16.1 13.0 - 17.0 g/dL   HCT 48.4 60.9 - 47.9 %   MCV 85.5 80.0 - 100.0 fL   MCH 26.7 26.0 - 34.0 pg   MCHC 31.3 30.0 - 36.0 g/dL   RDW 77.3 (H) 88.4 - 84.4 %  Platelets 309 150 - 400 K/uL   nRBC 0.0 0.0 - 0.2 %   Neutrophils Relative % 74 %   Neutro Abs 6.5 1.7 - 7.7 K/uL   Lymphocytes Relative 14 %   Lymphs Abs 1.2 0.7 - 4.0 K/uL   Monocytes Relative 10 %   Monocytes Absolute 0.8 0.1 - 1.0 K/uL   Eosinophils Relative 1 %   Eosinophils Absolute 0.0 0.0 - 0.5 K/uL   Basophils Relative 1 %   Basophils Absolute 0.0 0.0 - 0.1 K/uL   Immature Granulocytes 0 %   Abs Immature Granulocytes 0.02 0.00 - 0.07 K/uL    Comment: Performed at [facility], [address], [place], [place] [address]  Basic metabolic panel     Status: Abnormal   Collection Time: 01/28/24  7:01 PM  Result Value Ref Range   Sodium 137 135 - 145 mmol/L   Potassium 3.8 3.5 -  5.1 mmol/L   Chloride 95 (L) 98 - 111 mmol/L   CO2 26 22 - 32 mmol/L   Glucose, Bld 111 (H) 70 - 99 mg/dL    Comment: Glucose reference range applies only to samples taken after fasting for at least 8 hours.   BUN 40 (H) 8 - 23 mg/dL   Creatinine, Ser 8.25 (H) 0.61 - 1.24 mg/dL   Calcium  9.4 8.9 - 10.3 mg/dL   GFR, Estimated 42 (L) >60 mL/min    Comment: (NOTE) Calculated using the CKD-EPI Creatinine Equation (2021)    Anion gap 16 (H) 5 - 15    Comment: Performed at [facility], [address], [place], [place] [address]  Surgical PCR screen     Status: None   Collection Time: 01/29/24 10:48 AM   Specimen: Nasal Mucosa; Nasal Swab  Result Value Ref Range   MRSA, PCR NEGATIVE NEGATIVE   Staphylococcus aureus NEGATIVE NEGATIVE    Comment: (NOTE) The Xpert SA Assay (FDA approved for NASAL specimens in patients 6 years of age and older), is one component of a comprehensive surveillance program. It is not intended to diagnose infection nor to guide or monitor treatment. Performed at [facility], [address], [place], [place] [address]     CT KNEE LEFT WO CONTRAST Result Date: 01/29/2024 CLINICAL DATA:  Fall.  Pain. EXAM: CT OF THE LEFT KNEE WITHOUT CONTRAST TECHNIQUE: Multidetector CT imaging of the left knee was performed according to the standard protocol. Multiplanar CT image reconstructions were also generated. RADIATION DOSE REDUCTION: This exam was performed according to the departmental dose-optimization program which includes automated exposure control, adjustment of the mA and/or kV according to patient size and/or use of iterative reconstruction technique. COMPARISON:  Left knee radiographs dated 01/28/2024. FINDINGS: Bones/Joint/Cartilage Moderate volume lipohemarthrosis. There is a nondisplaced fracture of the posterolateral tibial plateau with approximately 3 mm of articular surface depression posteriorly. No additional fracture identified. No dislocation.  Mild tricompartmental osteoarthritis. Ligaments Ligaments are suboptimally evaluated by CT. Muscles and Tendons Muscles are within normal limits. The quadriceps and patellar tendons are intact. A Baker's cyst is noted. Soft tissue Soft tissue swelling anteriorly and anterolaterally. No loculated fluid collection. Peripheral vascular calcifications are noted. IMPRESSION: 1. Nondisplaced fracture of the posterolateral tibial plateau with approximately 3 mm of articular surface depression posteriorly. 2. Moderate volume lipohemarthrosis. 3. Mild tricompartmental osteoarthritis. Electronically Signed   By: Harrietta Sherry M.D.   On: 01/29/2024 11:46   DG Ankle Left Port Result Date: 01/29/2024 EXAM: 1 OR 2  VIEW(S) XRAY OF THE LEFT ANKLE 01/29/2024 06:33:00 AM CLINICAL HISTORY: Acute left ankle pain. COMPARISON: None available. FINDINGS: BONES AND JOINTS: No acute fracture. Plantar heel spur. No joint dislocation. SOFT TISSUES: Calcifications noted within the achilles tendon. IMPRESSION: 1. No acute abnormality. Electronically signed by: Waddell Calk MD 01/29/2024 06:38 AM EST RP Workstation: [id number]   CT Cervical Spine Wo Contrast Result Date: 01/28/2024 CLINICAL DATA:  Status post trauma. EXAM: CT CERVICAL SPINE WITHOUT CONTRAST TECHNIQUE: Multidetector CT imaging of the cervical spine was performed without intravenous contrast. Multiplanar CT image reconstructions were also generated. RADIATION DOSE REDUCTION: This exam was performed according to the departmental dose-optimization program which includes automated exposure control, adjustment of the mA and/or kV according to patient size and/or use of iterative reconstruction technique. COMPARISON:  None Available. FINDINGS: Alignment: There is approximately 2 mm anterolisthesis of the C4 vertebral bodies on C5 and C6 on C7. Skull base and vertebrae: No acute fracture. No primary bone lesion or focal pathologic process. Soft tissues and spinal canal: No  prevertebral fluid or swelling. No visible canal hematoma. Disc levels: Marked severity endplate sclerosis of the anterior osteophyte formation of posterior bony spurring are seen at the levels of C5-C6 and C6-C7. There is moderate to marked severity intervertebral disc space narrowing at C3-C4. Marked severity intervertebral disc space narrowing is seen at C5-C6 and C6-C7. Bilateral marked severity multilevel facet joint hypertrophy is noted. Upper chest: Negative. Other: None. IMPRESSION: 1. No acute fracture or traumatic subluxation. 2. Marked severity multilevel degenerative changes, as described above. Electronically Signed   By: Suzen Dials M.D.   On: 01/28/2024 21:39   CT CHEST ABDOMEN PELVIS W CONTRAST Result Date: 01/28/2024 CLINICAL DATA:  Trauma EXAM: CT CHEST, ABDOMEN, AND PELVIS WITH CONTRAST TECHNIQUE: Multidetector CT imaging of the chest, abdomen and pelvis was performed following the standard protocol during bolus administration of intravenous contrast. RADIATION DOSE REDUCTION: This exam was performed according to the departmental dose-optimization program which includes automated exposure control, adjustment of the mA and/or kV according to patient size and/or use of iterative reconstruction technique. CONTRAST:  75mL OMNIPAQUE  IOHEXOL  350 MG/ML SOLN COMPARISON:  CT abdomen and pelvis 12/12/2023.  CT chest 06/12/2017. FINDINGS: CT CHEST FINDINGS Cardiovascular: Heart is mildly enlarged. Right-sided pacemaker is present. There is no pericardial effusion. Aorta is normal in size. There are atherosclerotic calcifications of the aorta and coronary arteries. Mediastinum/Nodes: No enlarged mediastinal, hilar, or axillary lymph nodes. Thyroid gland, trachea, and esophagus demonstrate no significant findings. Lungs/Pleura: There is atelectasis in the inferior left lower lobe. There is a calcified granuloma in the right lower lobe. There are healed anterior rib fractures bilaterally. No acute  fractures are seen. The lungs are otherwise clear. There is no pleural effusion or pneumothorax. Musculoskeletal: There are healed anterior rib fractures bilaterally. No acute fractures are seen. Sternotomy wires are present. CT ABDOMEN PELVIS FINDINGS Hepatobiliary: No hepatic injury or perihepatic hematoma. Gallbladder is unremarkable. Pancreas: Unremarkable. No pancreatic ductal dilatation or surrounding inflammatory changes. Spleen: No splenic injury or perisplenic hematoma. Adrenals/Urinary Tract: No adrenal hemorrhage or renal injury identified. Bladder is unremarkable. There is a subcentimeter cyst in the inferior pole of the left kidney. Stomach/Bowel: Stomach is within normal limits. Appendix appears normal. No evidence of bowel wall thickening, distention, or inflammatory changes. There is sigmoid colon diverticulosis. Vascular/Lymphatic: Aortic atherosclerosis. No enlarged abdominal or pelvic lymph nodes. Reproductive: Prostate is unremarkable. Other: No ascites or free air. Musculoskeletal: There are mild compression deformities of L1 and L2  which appear chronic and unchanged. No acute fractures are seen. IMPRESSION: 1. No acute posttraumatic sequelae in the chest, abdomen or pelvis. 2. Mild cardiomegaly. 3. Sigmoid colon diverticulosis. 4. Chronic compression deformities of L1 and L2. 5. Aortic atherosclerosis. Aortic Atherosclerosis (ICD10-I70.0). Electronically Signed   By: Greig Pique M.D.   On: 01/28/2024 21:30   CT Head Wo Contrast Result Date: 01/28/2024 CLINICAL DATA:  Status post trauma. EXAM: CT HEAD WITHOUT CONTRAST TECHNIQUE: Contiguous axial images were obtained from the base of the skull through the vertex without intravenous contrast. RADIATION DOSE REDUCTION: This exam was performed according to the departmental dose-optimization program which includes automated exposure control, adjustment of the mA and/or kV according to patient size and/or use of iterative reconstruction  technique. COMPARISON:  December 15, 2023 FINDINGS: Brain: There is mild cerebral atrophy with widening of the extra-axial spaces and ventricular dilatation. There are areas of decreased attenuation within the white matter tracts of the supratentorial brain, consistent with microvascular disease changes. Vascular: Marked severity bilateral cavernous carotid artery calcification. Skull: Normal. Negative for fracture or focal lesion. Sinuses/Orbits: A chronic deformity of the right globe is noted. Other: None. IMPRESSION: 1. No acute intracranial abnormality. 2. Generalized cerebral atrophy and microvascular disease changes of the supratentorial brain. 3. Chronic deformity of the right globe. Electronically Signed   By: Suzen Dials M.D.   On: 01/28/2024 21:28   DG Knee Complete 4 Views Left Result Date: 01/28/2024 CLINICAL DATA:  Status post fall. EXAM: LEFT KNEE - COMPLETE 4+ VIEW COMPARISON:  None Available. FINDINGS: No evidence of an acute fracture or dislocation. Tricompartment joint space narrowing is seen, most prominent along the medial tibiofemoral compartment space. There is marked severity vascular calcification. A moderate to large suprapatellar effusion is noted. IMPRESSION: 1. Moderate to large suprapatellar effusion. 2. Tricompartment osteoarthritis. Electronically Signed   By: Suzen Dials M.D.   On: 01/28/2024 20:07   DG Hand Complete Right Result Date: 01/28/2024 CLINICAL DATA:  Status post fall. EXAM: RIGHT HAND - COMPLETE 3+ VIEW COMPARISON:  None Available. FINDINGS: There is dorsal dislocation of the PIP joint of the second right finger. An associated acute fracture is not clearly identified. Moderate severity diffuse soft tissue swelling of the second right finger is also noted. Degenerative changes are present throughout the right wrist. IMPRESSION: Dorsal dislocation of the PIP joint of the second right finger. Electronically Signed   By: Suzen Dials M.D.   On: 01/28/2024  20:06    Review of Systems  HENT:  Negative for ear discharge, ear pain, hearing loss and tinnitus.   Eyes:  Negative for photophobia and pain.  Respiratory:  Negative for cough and shortness of breath.   Cardiovascular:  Negative for chest pain.  Gastrointestinal:  Negative for abdominal pain, nausea and vomiting.  Genitourinary:  Negative for dysuria, flank pain, frequency and urgency.  Musculoskeletal:  Positive for arthralgias (Left knee). Negative for back pain, myalgias and neck pain.  Neurological:  Negative for dizziness and headaches.  Hematological:  Does not bruise/bleed easily.  Psychiatric/Behavioral:  The patient is not nervous/anxious.    Blood pressure (!) 142/86, pulse 89, temperature 98.4 F (36.9 C), temperature source Oral, resp. rate 16, height 5' 9 (1.753 m), weight 108.9 kg, SpO2 94%. Physical Exam Constitutional:      General: He is not in acute distress.    Appearance: He is well-developed. He is not diaphoretic.  HENT:     Head: Normocephalic and atraumatic.  Eyes:  General: No scleral icterus.       Right eye: No discharge.        Left eye: No discharge.     Conjunctiva/sclera: Conjunctivae normal.  Cardiovascular:     Rate and Rhythm: Normal rate and regular rhythm.  Pulmonary:     Effort: Pulmonary effort is normal. No respiratory distress.  Musculoskeletal:     Cervical back: Normal range of motion.     Comments: LLE No traumatic wounds, ecchymosis, or rash  Mod TTP knee  Large knee effusion  Sens DPN, SPN, TN intact  Motor EHL, ext, flex, evers 5/5  DP 2+, PT 2+, No significant edema  Skin:    General: Skin is warm and dry.  Neurological:     Mental Status: He is alert.  Psychiatric:        Mood and Affect: Mood normal.        Behavior: Behavior normal.     Assessment/Plan: Left tibia plateau fx -- Plan non-operative management with NWB LLE. F/u with Dr. Sherida in 2 weeks.    Ozell DOROTHA Ned, PA-C Orthopedic  Surgery [phone] 01/29/2024, 12:27 PM

## 2024-01-29 NOTE — Telephone Encounter (Signed)
 See other chart note

## 2024-01-29 NOTE — Telephone Encounter (Unsigned)
 Copied from CRM #[id number]. Topic: General - Other >> Jan 29, 2024 11:24 AM Delon DASEN wrote: Reason for CRM: [name] Muzzy- [name] is asking for a psych eval while patient is in hospital at [facility] for a fall. She states that Charmaine Grooms was wanting to get an eval done.

## 2024-01-29 NOTE — Telephone Encounter (Signed)
 Discharge given orders given to [facility] from [facility] per notes below.

## 2024-01-29 NOTE — Op Note (Signed)
 NAME: Douglas [name] MEDICAL RECORD NO: [id number] DATE OF BIRTH: June 16, 1955 FACILITY: [facility] LOCATION: [facility] OR PHYSICIAN: Douglas ALDERTON, MD   OPERATIVE REPORT   DATE OF PROCEDURE: 01/29/24    PREOPERATIVE DIAGNOSIS: Right index finger open proximal interphalangeal joint dorsal dislocation   POSTOPERATIVE DIAGNOSIS: Right index finger open proximal interphalangeal joint dorsal dislocation with capsulotomy   PROCEDURE: Right index finger PIP capsulotomy with irrigation and debridement Right index finger proximal interphalangeal joint dislocation open reduction and stabilization with pinning   SURGEON:  Douglas [name], M.D.   ASSISTANT: Douglas Rummer, PA   ANESTHESIA:  General   INTRAVENOUS FLUIDS:  Per anesthesia flow sheet.   ESTIMATED BLOOD LOSS:  Minimal.   COMPLICATIONS:  None.   SPECIMENS:  none   TOURNIQUET TIME: Not utilized  DISPOSITION:  Stable to PACU.   INDICATIONS: 68 year old male who sustained a right index finger open proximal interphalangeal joint dislocation after mechanical fall.  Bedside reduction was performed in the emergency department setting however ongoing stability was noted.  Given the ongoing instability and open nature of the injury, patient was indicated for right index finger proximal interphalangeal joint irrigation debridement with reduction of the PIP dislocation and possible stabilization.  Risks and benefits of surgery were discussed including the risks of infection, bleeding, scarring, stiffness, nerve injury, vascular injury, tendon injury, need for subsequent operation, recurrent instability, posttraumatic arthritis.  He voiced understanding of these risks and elected to proceed.  OPERATIVE COURSE: Patient was seen and identified in the preoperative area and marked appropriately.  Surgical consent had been signed. Preoperative IV antibiotic prophylaxis was given. He was transferred to the operating room and placed in supine position  with the right upper extremity on the armboard.  General anesthesia was induced by the anesthesiologist. Right upper extremity was prepped and draped in normal sterile orthopedic fashion.  A surgical pause was performed between the surgeons, anesthesia, and operating room staff and all were in agreement as to the patient, procedure, and site of procedure.  Tourniquet was placed and padded appropriately to the right upper arm however was not utilized for this case.  We first began with exploration of the index finger volar wound over the proximal and phalangeal joint.  Notable dislocation of the flexor digitorum profundus had occurred, this was likely creating the barrier to attempted reduction in the emergency department setting.  Flexor apparatus was reduced appropriately, joint was subsequently reduced and confirmed with biplanar fluoroscopy.  Notable capsulotomy had occurred to the volar capsule of the PIP joint.  Copious irrigation was performed, capsular tissue was then repaired.  In order to augment stabilization, 0.062 K wire was then introduced in retrograde fashion across the PIP joint.  Wire was placed in the dorsal third of the bone given the notable dorsal dislocation of the middle phalanx in order to prevent ongoing instability.  Wire placement was then confirmed with biplanar fluoroscopy, as well as maintenance of the joint reduction.  Closure of the skin surface was then performed utilizing 4-0 nylon in simple standard fashion.  Sterile dressings were applied.  Index finger was noted to have appropriate color and capillary refill throughout.  Clamshell splint utilizing plaster was then applied with the hand in the intrinsic plus position.  The wire was bent and cut appropriately.  Sling was also applied.  The operative drapes were broken down.  The patient was awoken from anesthesia safely and taken to PACU in stable condition.   Post-operative plan: The patient will recover in  the  post-anesthesia care unit and then be readmitted to the medical service.  The patient will be non weight bearing on the right upper extremity in a clamshell splint.  Dressing is to stay clean and dry until follow-up.  I will see the patient back in the office in 1 week for postoperative followup.    Douglas Doten, MD Electronically signed, 01/29/24

## 2024-01-29 NOTE — ED Notes (Signed)
 Patient yelling out NURSE! During shift change. RN entered the room. Patient had call light on his stomach. Patient educated on how to use the call light to ask for help instead of yelling out. Patient stated that he wanted pain medication. RN informed patient of the time that he is due and assured him the it would be brought in at the scheduled time. Patient verbalized understanding. Patient resting in bed with regular, unlabored breathing.

## 2024-01-29 NOTE — Discharge Instructions (Addendum)
 Hand Surgery Postop Instructions   Dressings: Maintain postoperative dressing until orthopedic follow-up.  Keep operative site clean and dry until orthopedic follow-up.  Wound Care: Keep your hand elevated above the level of your heart.  Do not allow it to dangle by your side. Moving your fingers is advised to stimulate circulation but will depend on the site of your surgery.  If you have a splint applied, your doctor will advise you regarding movement.  Activity: Do not drive or operate machinery until clearance given from physician. No heavy lifting with operative extremity.  Diet:  Drink liquids today or eat a light diet.  You may resume a regular diet tomorrow.    General expectations: Take prescribed medication if given, transition to over-the-counter medication as quickly as possible. Fingers may become slightly swollen.  Call your doctor if any of the following occur: Severe pain not relieved by pain medication. Elevated temperature. Dressing soaked with blood. Inability to move fingers. White or bluish color to fingers.   Per [facility] clinic policy, our goal is ensure optimal postoperative pain control with a multimodal pain management strategy. For all [facility] patients, our goal is to wean post-operative narcotic medications by 6 weeks post-operatively. If this is not possible due to utilization of pain medication prior to surgery, your [facility] doctor will support your acute post-operative pain control for the first 6 weeks postoperatively, with a plan to transition you back to your primary pain team following that. Douglas [name] will work to ensure a therapist, occupational.  Anshul Afton Alderton, M.D. Hand Surgery [facility]  [facility] Address: [address] [place], [place] [address] Phone Number: [phone] Hours of Operation: Residents of [place] can come to obtain food Monday through Friday from 8:30am until  3:30pm. Photo ID and Social Security cards required for all residents of a household. Can come six times a year  [facility] Address: [address] [place], [place] [address] Phone Number: [phone] Hours of Operation: Operates Tuesday-Friday 10:00 a.m. to 1 p.m. Requirements: Referral from DSS needed. May come 6 times a year, 30 days apart. Photo ID and SS required for all residents of household.  [facility] Address: [address] [place], [place] [address] Phone Number: [phone] Hours of Operation: Food pantry is open on the last Saturday of each month from 10:00 am - 12:00 noon. No appointment needed. No qualifications.  [facility] Address: [address] [place], [place] [address] Phone Number: [phone]. [phone] Hours of Operation: Must make reservations to pick up food on Saturdays. Sign ups for Saturday pick up beginning at 8:30 a.m. on Monday morning.  [facility] Address: [address] [place], [place] [address] Phone Number: [phone] Hours of Operation: If you need food, bring proper identification such as a driver's license to receive a bag of food once a month. Requirements: Can come once every 30 days with referral DSS, Holiday Representative, Mental health etc. Each referral good for six visits. Photo ID required. *1st visit no referral required.  [facility] Address: [address] [place], [place] [address] Phone Number: [phone]  [place] [facility] Address: [address] [place], [place] [address] Phone Number: [phone] Hours of Operation:  You can register at [email or web address] for free groceries  [facility] Address: [address] [place], [place] [address] Phone Number: [phone] Hours of Operation: Drive through giveaway, first come first served. Every 3rd Saturday  11AM - 1PM  [facility] [address] Address: [address]  [place], [place] [address] Phone Number: [phone]   [place]  [facility] Address: [address] [place], [place] [address] Phone Number: [phone] Hours of Operation: Once a month every 3rd Saturday  [facility] Address: [address] [place], [place] [address] Phone Number: [phone] Hours of Operation: Distribution happens from 9:00-10:00 a.m. every Saturday.     [facility] Address: [address] [place], [place] [address] Phone Number: [phone] Hours of Operation: ONCE a week for the community food distribution held every Tuesday, Wednesday and Thursday from 11 a.m. - 2:00 p.m. Food is available on a first come, first serve basis and varies week to week. No appointment necessary for drive thru pick up.  [facility] Address: [address] [place], [place] [address] Phone Number: [phone] Hours of Operation: Open every 3rd Thursday 9:30 a.m. - 11:00 a.m.  [facility] Address: [address] [place], [place] [address] Phone Number: [phone] Hours of Operation: Please call for hours, directions, and questions  [facility] [place] [facility] Address: [address], [place], [place]  [address] Phone Number: [phone] Website: https://www.hollyguns.co.za Food Finder app: https://findfood.ghpfa.org  [employer] [employer] Address: [address] [place], [place] [address] Phone Number: [phone] Hours of Operation: Contact Bree Harpe. Enrolled Substance Abuse Clients Only  [facility] Address: [address] [place], [address]  Phone Number: [phone] Hours of Operation: Contact Bartley Irving. Food pantry open the 3rd Saturday of each month from 9 a.m. -12 p.m. only  [place] [facility] Address: [address] [place], [place] [address] Phone Number: [phone] Hours of Operation: Contact Geni Lee. Emergency food bank open on Saturdays by appointment  only  [facility] Address: [address] [place], [place] [address] Phone Number: [phone] Hours of Operation: No specific contact person; Anyone can help  [facility] Address: [address] [place], [place] [address] Phone Number: [phone] Hours of Operation: Contact Medford Molt. Agency gives out a bag of food every Thursday from 2-4 p.m. only, and also provides a community meal every Thursday between 5-6 p.m. Other services provided include rent/mortgage and utility assistance, women's winter shelter, thrift store, and senior adult activities.  [facility] [place] Address: [address] [place], [place] [address] Phone Number: [phone] Hours of Operation: The Emergency Food Assistance Program provides individuals and families with a generous supply of food including meat, fresh vegetables, and nonperishable items. The food box contains five days' worth of food, and each family or individual can receive a box once per month. M, W, Th, Fr 11am-2pm, walk-ins welcome.  [facility] Address: [address] [place], [place] [address]  Phone Number: [phone] Hours of Operation: Contact Asia Cathlean. Tuesdays and Thursdays from 11am - 3pm by appointment only Sunday, by APPOINTMENT ONLY  [facility], [address], [address], [phone], 3.2 mi from [facility], call in advance for appointment at 10:00am or at 4:00pm, must provide valid photo ID  Monday  9:30am-5:00pm [facility], [address] [place] [phone], [phone], 0.9 mi from [facility], can come four times per year, bring your photo ID and SS cards for other residents of household, will make appointments for those who work and need to come after 5pm  10:00am-12:00noon  [address] [place]  [place], [address], [phone], 1.7 mi from [facility], can  come once every 60 days per household, need referral from DSS, [employer], etc., bring photo ID and SS card   10:00am-1:00pm [facility],  [address] [place], [address], [phone], 7.7 mi from [facility], can come once every thirty days with a referral from DSS, [employer], Mental Health, etc. -- each referral good for six visits, bring photo ID   10:00am-1:00pm [facility], [address] [address], [phone], 4.2 mi from [facility], can come once every 6 months, open to [facility] residents, bring photo ID and copy of a current utility bill in your name, please call first to verify that food is available  6:30pm-8:30pm [facility], [address], [address], [phone], 3.2 mi from [facility], can come once every 30 days, maximum 6 times per year, bring your photo ID and SS numbers for other residents of household  Monday by APPOINTMENT ONLY  [facility], [address], [address],  [phone], 2.5 mi from [facility], call in advance for appointment between 10:00am-2:00pm, bring your photo ID and SS cards for all residents of household, can come once every 3 months  [facility], [address], [address], [phone], 0.7 mi from [facility], call in advance for appointment, can come once every 30 days, bring your photo ID and SS cards for other residents of household   Tuesday  9:00am-12:00noon [employer], [address] [address], [phone], 1.3 mi from [facility], can come once every 3 months, bring your photo ID and SS numbers for other residents of household   9:00am-1:00pm [facility] [address],  [place], [address], [phone]/Ext [phone], 1.6 mi from [facility], can come once every two weeks  9:30am-5:00pm [employer], [address] [place] [phone], [phone], 0.9 mi from [facility], can come four  times per year, bring your photo ID and SS cards for other residents of household, will make appointments for those who work and need to come after 5pm  10:00am-12:00noon [employer], [address] [place]  [place], [address], [phone], 1.7 mi from [facility], can come once every 60 days per household, need referral from DSS, [employer], etc., bring photo ID and SS card  10:00am-1:00pm [facility], [id number],  [phone], 3.8 mi from [facility], with referral from DSS, may come six times, 30 days apart, bring your photo ID and SS cards for all residents of household   10:00am-1:00pm [address] the [facility], [address] [place], [address], [phone], 7.7 mi from [facility], can come once every thirty days with a referral from DSS, [employer], Mental Health, etc.- each referral good for six visits, bring photo ID   10:00am-1:00pm [facility], [address] [address], [phone], 4.2 mi from [facility], can come once every 6 months, open to [facility] residents, bring photo ID and copy of a current utility bill in your name, please call first to verify that food is available  2:00pm-3:30pm [facility], [address] [phone], [phone], 3.7 mi from [facility], can come twelve times per  year, one bag per family, bring photo ID   FIRST AND THIRD Tuesdays  10:00am-1:00pm, [address] [place], [address], [phone], 7.2 mi from [facility], can come once every 30 days   Tuesday, WHEN FOOD IS AVAILABLE (call)  12:00noon-2:00pm [facility], [address], [address], [phone], 1.5 mi from  [facility], can come once every 30 days, bring photo ID   Tuesday, by APPOINTMENT ONLY  [facility], [address] [place], [address],  [phone], 2.5 mi from  [facility], call in advance for appointment between 1:00pm-4:00pm, bring your photo ID and SS cards for all residents of household, can come once every 3 months   [address] of [address], [address], [address], [phone], 5 mi from [facility], call one day ahead for appointment the next day between 10:00am and 12:00noon, can come once every 3 months, bring your photo ID and must qualify according to family income   [facility], [address], [address], [phone], 0.7 mi from [facility], call in advance for appointment, can come once every 30 days, bring your photo ID and SS cards for other residents of household   [address] of [place], [address], [address],  [phone], 5.1 mi from [facility], call between 9:00am and 1:00pm M-F to make appointment. Appointments are scheduled for Tues and Thurs from 10:00am-11:30am, bring photo ID, can come once every 6 months, limit three visits over 18 months, then must have referral  Wednesday  9:30am-5:00pm [facility], [address] [place] [phone], [phone], 0.9 mi from [facility], can come four times per year, bring your photo ID and SS cards for other residents of household, will make appointments for those who work and need to come after 5pm  9:30am-11:30am [employer] [facility], [address], [address], [phone], 6.6 mi from [facility], can come once every 30 days, bring your photo ID, and SS cards for other residents of household, each monthly visit requires a written referral from GUM or DSS with number in household on form  10:00am-12:00noon [address], [address]  [place], [address], [phone], 1.7 mi from [facility], can come once every 60 days per household, need referral from DSS, [employer], etc., bring photo ID and SS card  10:00am-1:00pm [facility], [id number],  [phone], 3.9 mi from [facility], with referral from DSS, may come six times, 30 days apart, bring your photo ID and SS cards for all residents of household   10:00am-1:00pm [address] the [facility], [address] [place], [address], [phone], 7.7 mi from [facility], can come once every thirty days with a referral from DSS, [employer], Mental Health, etc. - each referral good for six visits, bring photo ID   2:00pm-5:45pm [address], [address] [place], [address], [phone], 3.2 mi from [facility], once every 30 days, first come/first served, limited to first 25, bring photo ID   6:30pm-8:30pm [facility], [address] [address], [phone], 3.2 mi from [facility], can come once every 30 days, maximum 6 times per year, bring your photo ID and SS numbers for other residents of household  FIRST and THIRD Wednesdays  9:00am-12:00noon, [address], [address] [place], [address], [phone], 4.2 mi from [facility], can come once a month. Please arrive and sign in no later than 11:15 so everyone can be served by 12 noon.  THIRD Wednesday  1:30pm-3:00pm, [address] [place], [address], [phone] or [phone], 2.1 mi from [facility]  Wednesday, by APPOINTMENT ONLY  [address] [facility], [address] [address], [phone], 5 mi from [facility], call one day ahead for appointment the next day between 10:00am and 12:00noon, can come once every 3 months, bring your photo ID and must qualify according to family income   [facility], [address], [address], [phone], 0.7 mi from [facility], call in advance for appointment, can come once every 30 days, bring your photo ID and SS cards for other residents of household   [address] of [place], [address] [place], [address], [phone], 3.2 mi from [facility], call in advance for  appointment at 7:00pm, must provide valid photo ID  Thursday  9:00am-12:00noon [employer], [address] [address], [phone], 1.3 mi from [facility], can come once every 3 months, bring your photo ID and SS numbers for other residents of household   9:00am-1:00pm [facility] [address]  [place], [address], [phone]/Ext 1, 1.6 mi from [facility], can come once every two weeks  9:30am-12:00noon [employer], [address], [address], [phone], 4.5 mi from [facility], can come once every 30 days, must state income [closed Thanksgiving and week of Christmas]  9:30am-5:00pm [employer], [address] [place] [phone], [phone], 0.9 mi from [facility], can come four times per year, bring your photo ID and SS card for other residents of household, will make appointments for those who work and need to come after 5pm  10:00am-1:00pm [employer], [address] [place], [id number],  [phone], 3.9 mi from [facility], with referral from DSS, may come six times, 30 days apart, bring your photo ID and SS cards for all residents of household   10:00am-1:00pm [address] the [facility], [address] [place], [address], [phone], 7.7 mi from [facility], can come once every thirty days with a referral from DSS, [employer], Mental Health, etc.- each referral good for six visits, bring photo ID   10:00am-1:00pm [facility], [address], [address], [phone], 4.2 mi from [address] [facility], can come once every 6 months, open to [facility] residents, bring photo ID and copy of a current utility bill in your name, please call first to verify that food is available  SUNDAYS BREAKFAST TWO LOCATIONS: 8:00am served in [facility] by Awaken P[employer] 8:30am SHUTTLE provided from [facility], served at [employer], [address] LUNCH TWO LOCATIONS [plus one additional third Sunday only] 10:30am - 12:30pm served at [employer], [employer], [place] [address] (1.2 miles from [facility]) 12:30pm served in [place] by Land O'lakes Team (THIRD Sunday only) 1:30pm served at [facility] by [facility] one location [plus one additional third Sunday only] 5:00pm Every Sunday, served under the bridge at [address] by Manpower Inc Under the [employer]m Company (.7 miles from [facility]) (THIRD  Sunday ONLY) 4:00pm served in the parking garage, across from [facility], corner of [place] and [place] by Ryland Group [employer] Ministries MONDAYS BREAKFAST 7:30am served in [employer] by the [employer] and Friends LUNCH 10:30am - 12:30pm served at [employer], [employer], [place] [address] (1.2 miles from [facility]) DINNER TWO LOCATIONS: 7:00pm served in front of the courthouse at the corner of Goldman Sachs and Monsanto Company. by [place] Monday Night Meal (3 blocks from [facility]) 4:30pm served at the Autonation, [address] by Bank Of [place] Company (0.6 miles from [facility]) TUESDAYS BREAKFAST 8:00am - 9:00am served at The Tjx Companies, [address] (0.3 miles from [place]) LUNCH 10:30am - 12:30pm served at the [employer], [employer] [address] (1.2 miles from [place]) DINNER 6:00pm served at Csx Corporation, enter from Capital One and go to the Sonic [employer]otive, (0.7 miles from [place]) [facility] BREAKFAST 7:00am - 8:00am served at [employer], [employer] [address], (1.2 miles from [place]) LUNCH ONE LOCATION [plus two additional locations listed below] 10:30am - 12:30pm served at [employer], [employer] [address] (1.2 miles from [place]) (FIRST Wednesday ONLY) 11:30am served at Dillard's, [place] [address] (6.6 miles from [place]) (SECOND Wednesday ONLY) 11:00am served at [address] Garnette Ava Blackwood of [address], [address] (1.3 miles from [place]) [place] TWO LOCATIONS 6:00pm served at W. R. Berkley, [place] W[address] Visteon Corporation. (1.3 miles from [facility]) 4:00pm - 6:00pm (hot dogs and chips) served at Levi Strauss of [facility], [address] (1.7 miles from [place]) [place] BREAKFAST NOT AVAILABLE AT THIS TIME LUNCH 10:30am - 12:30pm served at [employer], [employer], [place] [address] (1.2 miles from [place]) DINNER 6:00pm served at [employer], enter from Capital One and go to the S[employer], (0.7 miles from Nucor Corporation) [place] BREAKFAST NOT AVAILABLE AT THIS TIME LUNCH 10:30am - 12:30pm served at [employer], [employer] [address] (1.2 miles from [place]) DINNER TWO LOCATIONS, [plus one additional first Friday only] 6:00pm served under the bridge at [address] by [place] Under Csx Corporation. (.7 miles from [facility]) 5:00pm - 7:00pm served at Levi Strauss of [facility], [address] (1.7 miles from [place]) (FIRST Friday ONLY) 5:45 pm - SHUTTLE provided from the LIBRARY at 5:45pm. Served at [facility], [address]. SATURDAYS BREAKFAST TWO LOCATIONS [plus one additional last Saturday only] 8:00am served at [facility] by Delphi 8:30am served at [employer], [address] (2.2 miles from [facility]) (LAST Saturday ONLY) 8:30am served at Beazer Homes, [address] (5 miles from [place]) LUNCH 10:30am - 12:30pm served at [employer], [employer] [address] (1.2  miles from [facility]) DINNER 6:00pm served under the bridge at [address] by World Fuel Services Corporation (0.7 miles from Nucor Corporation)  DIRECTIONS FROM [place] TO ALL MEAL LOCATIONS The Bridge at [address] (.7 miles from [address]) [address] on [place]. Turn Right onto Directv [phone]  ft. Continue onto [address] under bridge, about 500 ft. Courthouse (3 blocks from [facility]) South on [address]. Turn right on [address]  1 block to Ppl Corporation (.5 miles from [place]) [name] on [place]. Yrc Worldwide. past Brink's Company to Emcor. Enter from Capital One and go to the Affiliated Computer Services building W. R. Berkley [address] Visteon [address]. (1.3 miles from [facility]) [address] on [place]. Turn Right onto [address]. [address]. church will be on the Left. The Tjx Companies [address] (.3 miles from [facility]) Go .3 miles on [address] Friend[address] Destination is on your right Dillard's at [place] (6.6 miles from [address]) [address] on [place] toward [address] Friend[address] Turn right onto [address] Continue onto Alcoa Inc. Continue onto Toll Brothers. 5. [place] is on right SANMINA-SCI Futures Trader) [address] (.6 miles from [place]) [place] on [place]. [address] Turn Left onto [address] 0.3 miles Destination is on the Left. [place] Black & Decker at American Express (5 miles from Nucor Corporation) 1. Head south on [address]. Turn right onto [address] Turn slightly left onto [facility] Continue onto [facility] Turn right at [employer] Continue to church on right [facility] [address] (1.7 miles from [place]) [address] on [place] 1.4 miles [place] becomes [place]. Elm [address] Continue 0.6 miles and church will be on theright. [place] [employer] at [address] (2.5 miles from [employer]) [place] provided from [employer] [place]] [place] on [place]. Elm toward [employer] right onto [employer] left onto [employer] Electric Turn left onto [employer]ron Technology [address] (2.2 miles from [address]) [address] on [place] 1.4 miles [place] becomes [place]. Elm [address] Turn right onto [address] and church will be on the Left. [facility]/[place] At&t [address] (1.2 miles from [facility]) 1.Turn right onto [facility] 2.Turn left onto GEANNIE Beagle 3.Micheline MICAEL Jama 4.Destination is on your right [place]. Garnette Ava Tommi of [address] at [place] (1.3 miles from [facility]) [address] on [address] Turn left onto [employer] right onto S. Maryellen Cassis. Continue onto Kb Home [place]. Turn left onto [employer]. Turn right onto Well[employer]. Rent/Utility Assistance in [facility]:  INNOVATIVE PATHWAYS [address], [place], [place] [address] [phone] Mon 8:00am - 6:00pm; Tue 8:00am - 6:00pm; Wed 8:00am - 6:00pm; Thu 8:00am - 6:00pm; Fri 8:00am - 6:00pm; Email: [email or web address] Eligibility: Residents of [place], [place], [place], [place], [place] and [place] that meet income limits. Call or text for eligibility screening.   [facility] MINISTRY [address] [place], [place], [place] [address] [phone] (Main: Rental Assistance) [phone] (Main: Utility Assistance) Mon 8:30am - 5:00pm; Tue 8:30am - 5:00pm; Wed 8:30am - 5:00pm; Thu 8:30am - 5:00pm; Fri 8:30am - 5:00pm; Website: http://www.greensborourbanministry.org/emergency-assistance-program Eligibility: People who have an unexpected crisis or emergency that can be verified. Must have some form of income and meet income limits. At the first of the month, only helps with rent/mortgage assistance for those who have court ordered eviction notices. Call for application information. Call  for exact documents that will be needed. Examples of  documents that may be needed: Photo ID, Social Security cards for everyone in the household, and proof of income for previous 2 months. Copy of eviction notice for rent assistance and copy of final notice for utility assistance. Statements or receipts of bills for previous 2 months.   [facility] - [place] [address] [place], [place] [address] [phone] (Main) [phone] (Alternate) Mon 9:00am - 5:00pm; Tue 9:00am - 5:00pm; Wed 9:00am - 5:00pm; Thu 9:00am - 5:00pm; Fri 9:00am - 5:00pm; Website: [email or web address].[email or web address]/[place]/emergency-financial-assistance Email: [email or web address] Eligibility: People experiencing a housing crisis with past-due rent and/or utilities and meet income limits. Must be willing to take part in 6 Call or visit website to download application. Return complete application by mail or email only. Documents: Help with Utilities: Photo ID, proof of household income, copies of monthly bills or receipts, and a final disconnection/shut-off notice. Help with Rent or Mortgage: Photo ID, proof of income, copies of monthly bills or receipts, and eviction notice. Help with Household Goods: Photo ID, proof of household income, copies of monthly bills or receipts, and a fire or flood report.  [facility] - [place] [address], [place], [place] [address] [phone] (Main) Mon 8:00am - 5:00pm; Tue 8:00am - 5:00pm; Wed 8:00am - 5:00pm; Thu 8:00am - 5:00pm; Fri 8:00am - 12:00pm; Website: [email or web address].[email or web address]/[place]/emergency-financial-assistance Email: [email or web address] Call for eligibility information. Apply :Utilities Assistance: Visit office by 8:30am on 1st and 4th Monday of each month to pick up application. Rent and Mortgage Assistance: Visit office by 8:30am on 2nd and 3rd Monday of each month to pick up application.  NOTE: If Monday falls on a holiday applications can be picked up the following Tuesday. Documents required will be listed on application.  [facility] [phone] (Main) Seen by appointment only. Call for more information. Eligibility: Meet income limits. Apply: Call for information on how to schedule an appointment. Each month there is a specific day to call to schedule an appointment. It is stated on the agency voicemail message. Appointments fill up quickly each month. Documents: Photo ID, copy of current utility bill.  [facility] HANDS [place] [address] [place], [place] [address] [phone] (Main) Tue 9:00am - 4:00pm; Wed 9:00am - 4:00pm; Thu 9:00am - 4:00pm; Website: [email or web address].[email or web address] Email: [email or web address] Eligibility: Utility Assistance: Meet income limits and be a Holiday Representative. [employer] customers do not qualify. Must not have received utility assistance for another agency within the last 90 days. Rent Assistance: Residents of [place] who meet income limits. Must not have received rent assistance for another agency within the last 90 days. Apply: Call to schedule an appointment. Documents: Utility Assistance: Photo ID, City of [employer], copy of lease (if not paying a mortgage), proof of income, and monthly expenses. Rent Assistance: Photo ID, W-9 from the landlord, copy of the lease, proof of income, and a list of monthly expenses.  [facility] - [place] [address] [place], [place] [address] [phone] (Main: Help With Rent) [phone] (Main: Help With Utilities) Mon 9:00am - 4:00pm; Tue 9:00am - 4:00pm; Wed 9:00am - 4:00pm; Thu 9:00am - 4:00pm; Fri 9:00am - 4:00pm; Website: [email or web address] Email: [email or web address] Eligibility: People experiencing a financial  crisis. Apply: Call to schedule an appointment Wednesday, 7:30am. Documents: Photo ID, Social Security card, proof of income, and proof of address. Other documents may be required, depending on service. Call for more information.  LOW INCOME ENERGY ASSISTANCE PROGRAM DEPARTMENT OF SOCIAL SERVICES - [facility] [address], [place], [place] [address] [phone] (Main) Mon 8:00am - 5:00pm; Tue 8:00am - 5:00pm; Wed 8:00am - 5:00pm; Thu 8:00am - 5:00pm; Fri 8:00am - 5:00pm; Website: [email or web address] Eligibility: Meet income limits and resource guidelines. Each household is only eligible once, even if multiple members apply. Apply: Call to see if funds are available. Visit to complete an application, call to have 1 mailed, or apply online at [email or web address] NOTE: Households with a person age 55 and over or a person with a documented disability can apply beginning December 1. Other households can apply beginning January 1. Documents: Photo ID, birth certificate, proof of household income, copy of utility bill, latest bank statement, the names and Social Security numbers for everyone in the household, and proof of disability if under age 75.  LOW INCOME ENERGY ASSISTANCE PROGRAM DEPARTMENT OF SOCIAL SERVICES - [facility] [address] [place], [place], [place] [address] [phone] (Main) Mon 8:00am - 5:00pm; Tue 8:00am - 5:00pm; Wed 8:00am - 5:00pm; Thu 8:00am - 5:00pm; Fri 8:00am - 5:00pm; Website: [email or web address] Eligibility: Meet income limits and resource guidelines. Each household is only eligible once, even if multiple members apply. Apply: Call to see if funds are available. Visit to complete an application, call to have 1 mailed, or apply online at [email or web address] NOTE: Households with a person age 75 and over or a person with a  documented disability can apply beginning December 1. Other households can apply beginning January 1. Documents: Photo ID, birth certificate, proof of household income, copy of utility bill, latest bank statement, the names and Social Security numbers for everyone in the household, and proof of disability if under age 46. [employer] assistance programs Crisis assistance programs  -Partners Ending Homelessness Arts Development Officer. If you are experiencing homelessness in [place], [place] , your first point of contact should be Pensions Consultant. You can reach Coordinated Entry by calling [phone] or by emailing [email or web address] [email or web address].  Community access points: [employer] [phone] [address], [place]) every Tuesday from 9am-10am. [facility] ([address] [place]. [address] [place]) every Wednesday from 8am-9am.   -[place] Coordinated Re-entry Daniel Mcalpine: Dial 211 and request. Offers referrals to homeless shelters in the area.    -The [employer] [phone]) offers several services to local families, as funding allows. The Emergency Assistance Program (EAP), which they administer, provides household goods, free food, clothing, and financial aid to people in need in the [place]  area. The EAP program does have some qualification, and counselors will interview clients for financial assistance by written referral only. Referrals need to be made by the Department of Social Services or by other EAP approved human services agencies or charities in the area.  -Open Door Ministries of [place], which can be reached at [phone], offers emergency assistance programs for those in need of help, such as food, rent assistance, a soup kitchen, shelter, and clothing. They are based in [facility] [place]  but provide a number of services to those that qualify for assistance.   [facility] Department of Social Services may  be able to offer temporary financial assistance and cash grants for paying rent and utilities, Help may be provided for local county residents who may be experiencing personal crisis when other resources, including government programs, are not available. Call [phone]  -High [employer] Army is a [employer] agency, The organization can offer  emergency assistance for paying rent, electric bills, utilities, food, household products and furniture. They offer extensive emergency and transitional housing for families, children and single women, and also run a Boy's and D[employer]. Thrift Shops, Secondary School Teacher, and other aid offered too. [address], [place], [place]  [address], [phone]  -[place] Low Income Energy Assistance Program -- This is offered for [facility] families. The federal government created [employer] Program provides a one-time cash grant payment to help eligible low-income families pay their electric and heating bills. [address] [place], [place]  [address], [phone]  -[place] Emergency Assistance -- A program offers emergency utility and rent funds for greater [place] area residents. The program can also provide counseling and referrals to charities and government programs. Also provides food and a free meal program that serves lunch Mondays - Saturdays and dinner seven days per week to individuals in the community. [address], [place], [place]  [address], [phone]  -[employer] - Offers affordable apartment and housing communities across      [place] and [place]. The low income and seniors can access public housing, rental assistance to qualified applicants, and apply for the section 8 rent subsidy program. Other programs include Chiropractor and Engineer, Maintenance. [address] [place], [place]  [address],  dial [phone].  -The [facility] provides transitional housing to veterans and the disabled. Clients will also access other services too, including assistance in applying for Disability, life skills classes, case management, and assistance in finding permanent housing. [address], [place], [place]  [address], call [phone]  -[place] Transitional Housing through [employer] is for people who were just evicted or that are formerly homeless. The non-profit will also help then gain self-sufficiency, find a home or apartment to live in, and also provides information on rent assistance when needed. Phone [phone]  -The [facility] helps low income, elderly, or disabled residents in seven counties in the [place] ([place], [place], [place], [place], [place], [place], [place], and [place]) save energy and reduce their utility bills by improving energy efficiency. Phone [phone].  -[employer] is located in the [place] [facility] in the [employer], [address], [place], [place] [address]. Parking is in the rear of the building. Phone: [phone]   General Email: Milo ann  GHC provides free housing counseling assistance in locating affordable rental housing or housing with support services for families and individuals in crisis and the chronically homeless. We provide potential resources for other housing needs like utilities. Our trained counselors also work with clients on budgeting and financial literacy in effort to empower them to take control of their financial situations. [employer] collaborates with homeless service providers and other stakeholders as part of the [employer] COC (Continuum of Care). The (COC) is a regional/local planning body that coordinates housing and services funding for homeless  families and individuals. The role of GHC in the COC is through housing counseling to work with people we serve on diversion strategies for those that are at imminent risk of becoming homeless. We also work with the Coordinated Assessment/Entry Specialist who attempts to find temporary solutions and/or connects the people to Housing First, Rapid Re-housing or transitional housing programs. Our Homelessness Prevention Housing Counselors meet with clients on business days (Monday-Fridays, except scheduled holidays) from 8:30 am to 4:30 pm.  Legal assistance for evictions,  foreclosure, and more -If you need free legal advice on civil issues, such as foreclosures, evictions, electronics engineer, government programs, domestic issues and more, Landscape Architect of [facility]  [facility]) is a associate professor firm that provides free legal services and counsel to lower income people, seniors, disabled, and others, The goal is to ensure everyone has access to justice and fair representation. Call them at ([phone].  [facility] for [facility] and Community Studies can provide info about obtaining legal assistance with evictions. Phone [phone].  Data Processing Manager  The [employer], [employer]. offers job and dispensing optician. Resources are focused on helping students obtain the skills and experiences that are necessary to compete in today's challenging and tight job market. The non-profit faith-based community action agency offers internship trainings as well as classroom instruction. Classes are tailored to meet the needs of people in the [facility] region. [place], [place] [address], [phone]  Foreclosure prevention/Debt Services Family Services of the [employer] Credit Counseling Service inludes debt and foreclosure prevention programs for local families. This includes money management, financial advice, budget review and development of a written action plan with a chief strategy officer to help solve specific individual financial problems. In addition, housing and mortgage counselors can also provide pre- and post-purchase homeownership counseling, default resolution counseling (to prevent foreclosure) and reverse mortgage counseling. A Debt Management Program allows people and families with a high level of credit card or medical debt to consolidate and repay consumer debt and loans to creditors and rebuild positive credit ratings and scores. Contact [phone] F1555895.  Community clinics in [place] -Health Department [facility]: [address] [place], [place], [address]. [phone].  -Health Department [place] [facility]: [address], [facility], [address]. [phone].  -[facility] offers medical care through a group of doctors, pharmacies and other healthcare related agencies that offer services for low income, uninsured adults in [place]. Also offers adult Dental care and assistance with applying for an [employer]. Call [phone].   [facility]. This center provides low-cost health care to those without health insurance. Services offered include an onsite pharmacy. Phone [phone]. [address], [place].  -Medication Assistance Program serves as a link between pharmaceutical companies and patients to provide low cost or free prescription medications. This service is available for residents who meet certain income restrictions and have no insurance coverage. PLEASE CALL [phone] [place]) OR [phone] ([place])  -One Step Further: Materials Engineer, The [employer] Support & Nutrition Program, [place]. Call [phone]/ [phone].  Food pantry and assistance -Urban Ministry-Food Bank: [address], [place] [address]. Phone [phone]  -[facility]less[facility] Food Pantry: [address] [place], [place] [address]. [phone].  -Missionary Ministry: has the purpose of visiting the sick and shut-ins and provide for needs in the surrounding communities. Call ([phone]. Email: [email or web address] This program provides: Food box for seniors, Financial assistance, Food to meet basic nutritional needs.  -Meals on Wheels with Senior Resources: [facility] [place] residents age 30 and over who are homebound and unable to obtain and prepare a nutritious meal for themselves are eligible for this service. There may be a waiting list in certain parts of [facility] if the route in that area is full. If you are in [facility] and [place] call [phone] to register. For all other areas call [phone] to register.  -  Greater The [employer] Companies: https://findfood.bargaincontractor.si  TRANSPORTATION: -[employer] Us Department of Health: Call [facility] and [employer]ie at 4087725078 for details. attractionguides.es  -Access [place]: Access [place] is the [employer] Agency's shared-ride transportation service for eligible riders who have a disability that prevents them from riding the fixed route bus. Call [phone]. Access [place] riders must pay a fare of $1.50 per trip, or may purchase a 10-ride punch card for $14.00 ($1.40 per ride) or a 40-ride punch card for $48.00 ($1.20 per ride).  -The Shepherd's WHEELS rideshare transportation service is provided for senior citizens (60+) who live independently within [place] city limits and are unable to drive or have limited access to transportation. Call [phone] to schedule an appointment.  -[place] Transportation: For Medicare or Medicaid recipients call [phone]?SABRA Ambulance, wheelchair fleeta, and ambulatory quotes available.   FLEEING VIOLENCE: -Family Services of the [place]- 24/7 Crisis line  [phone]) -[facility] Justice Centers: (336) 641-[phone] [phone])  [place] 2-1-1 is another useful way to locate resources in the community. Visit shedsizes.ch to find service information online. If you need additional assistance, 2-1-1 Referral Specialists are available 24 hours a day, every day by dialing 2-1-1 or [phone] from any phone. The call is free, confidential, and available in any language.  Affordable Housing Search http://www.nchousingsearch.[facility])   M-F 8a-3p [address] [place]  [place], [place] [address] [phone] Services include: laundry, barbering, support groups, case management, phone & computer access, showers, AA/NA mtgs, mental health/substance abuse nurse, job skills class, disability information, VA assistance, spiritual classes, etc. Winter Shelter available when temperatures are less than 32 degrees.   HOMELESS SHELTERS [facility] Night [facility] at [facility]- Call [phone] ext. 347 or ext. 336. Located at [address], [place], [place] [address]  [facility] Mens Shelter- Call [phone]. Located at [address], [place] [address].  [facility]- [employer]. Call [phone]. Office located at [address], [place] [address].  Pathways Family Housing through [place] [phone].  [facility] Family Shelter- Call [phone]. Located at [address], [place], [place] [address].  Room at the Inn-For Pregnant mothers. Call [phone]. Located at [address] [place], [address].  [place] [facility]-For men in [place]. Call [phone]. [address]-Shelter in [place]. Call [phone].  Home of [employer] for [employer] [phone]. Office located at [address] [place], [address].  [employer] be agreeable to help with chores. Call [phone] ext.  5000.  Men's: [address] [place], [place] [address]. Women's: [facility]  [address] [place], [place] [address]  Crisis Services Therapeutic Alternatives Mobile Crisis Management- [phone]  [facility] [address] [place], [place] [address]. Phone: [phone]

## 2024-01-29 NOTE — ED Notes (Signed)
 Pt states his left knee and ankle have been giving him problems. Pt states that's why he think he lost his balance

## 2024-01-29 NOTE — Progress Notes (Signed)
 Brief same day note:  Patient is a 68 year old male with history of coronary disease status post CABG, A-fib on Eliquis , history of VT, HFrEF with ICD, CKD stage III who presented with fall causing injury in the right second finger.  On prednisone, he was hemodynamically stable.  Lab work showed creatinine of 1.7.  No acute findings on CT head, cervical spine, CT chest/abdomen/pelvis.  X-ray showed dorsal dislocation of the second right PIP joint and moderate to large suprapatellar effusion on the left knee.hand surgery consulted.  Plan for right index finger reduction and stabilization of PIP index finger dislocation today by hand surgery.  Orthopedics also consulted for left knee pain/swelling/CT ordered.  Patient seen and examined at bedside today.  Lying comfortably in bed.  Complains of left knee pain. awaiting for hand surgery today.  Hemodynamically stable.  No new complaints.  Lives alone.  Assessment and plan:  Open dislocation of the right second PIP joint: X-ray showed dorsal dislocation of the second right PIP joint.  Hand surgery planning for right index finger reduction and stabilization of PIP index finger dislocation  Left knee injury: X-ray of the left knee did not show any fracture or dislocation but showed moderate to large suprapatellar effusion.  Has significant pain and difficulty weightbearing.  MRI was ordered but could not be done due to incompatible pacemaker.  Orthopedics consulted. Discussed with  Dominique Purchase. Ordering CT of the left knee without contrast  Chronic HFrEF: Status post ICD.  Takes torsemide , lactone, metoprolol  at home.  Currently euvolemic.  Used to follow-up with cardiology at [facility].  Has not followed up with any cardiologist recently.  We can consider arranging a follow-up with cardiology at [facility] when he is close to dc date.    Coronary artery  disease: No anginal symptoms.  Takes metoprolol , Crestor , Eliquis .  Eliquis  on hold.  History of  permanent A-fib/VT: On Eliquis  at home currently on hold.  Continue Toprol , amiodarone , mexiletine, digoxin .  Monitor on telemetry  CKD stage IIIa: Chronic and present baseline.  Monitor BMP  Asthma: Currently not on exertion.  Continue bronchodilators as needed  Depression/anxiety: On celexa  and as needed xanax   Obesity: BMI 35.4

## 2024-01-29 NOTE — ED Notes (Signed)
 Pt readjusted in bed. Pillow placed under left ankle

## 2024-01-29 NOTE — Anesthesia Procedure Notes (Signed)
 Procedure Name: LMA Insertion Date/Time: 01/29/2024 2:44 PM  Performed by: Lansing Hildegard NOVAK, CRNAPre-anesthesia Checklist: Patient identified, Patient being monitored, Timeout performed, Emergency Drugs available and Suction available Patient Re-evaluated:Patient Re-evaluated prior to induction Oxygen Delivery Method: Circle system utilized Preoxygenation: Pre-oxygenation with 100% oxygen Induction Type: IV induction Ventilation: Mask ventilation without difficulty LMA: LMA inserted LMA Size: 4.0 Tube type: Oral Number of attempts: 1 Placement Confirmation: positive ETCO2 and breath sounds checked- equal and bilateral Tube secured with: Tape Dental Injury: Teeth and Oropharynx as per pre-operative assessment

## 2024-01-29 NOTE — Consult Note (Signed)
 HAND SURGERY CONSULTATION  REQUESTING PHYSICIAN: Jillian Buttery, MD   Chief Complaint: Right index finger PIP dislocation  HPI: Douglas [name] is a 67 y.o. male who presents with dorsal dislocation of the PIP joint of the right index finger after mechanical fall.  He was seen in the emergency department setting, underwent clinical and radiographic workup.  Does have open laceration injury over the volar aspect of the index finger at the PIP level as well.  Bedside reduction was performed by the emergency department setting overnight, however joint remained unstable.  Patient was admitted to the medical service for preoperative clearance and formalized stabilization of the PIP joint surgically.  Hand dominance: Right   Past Medical History:  Diagnosis Date   A-fib (HCC)    AICD (automatic cardioverter/defibrillator) present    Anemia    Asthma    CAD (coronary artery disease)    CHF (congestive heart failure) (HCC)    Diabetes mellitus without complication (HCC)    Dyslipidemia    Elevated cholesterol    Hypertension    Past Surgical History:  Procedure Laterality Date   CARDIAC CATHETERIZATION  1996   stent placement   CARDIAC DEFIBRILLATOR PLACEMENT     CORONARY ARTERY BYPASS GRAFT  2002   times 4   CORONARY ARTERY BYPASS GRAFT     EYE SURGERY     ICD GENERATOR CHANGEOUT N/A 09/30/2018   Procedure: ICD GENERATOR CHANGEOUT;  Surgeon: Waddell Danelle ORN, MD;  Location: [facility] INVASIVE CV LAB;  Service: Cardiovascular;  Laterality: N/A;   IMPLANTABLE CARDIOVERTER DEFIBRILLATOR (ICD) GENERATOR CHANGE N/A 09/10/2011   Procedure: ICD GENERATOR CHANGE;  Surgeon: Danelle ORN Waddell, MD;  Location: [facility] CATH LAB;  Service: Cardiovascular;  Laterality: N/A;   INSERT / REPLACE / REMOVE PACEMAKER     LEFT HEART CATHETERIZATION WITH CORONARY ANGIOGRAM N/A 08/29/2011   Procedure: LEFT HEART CATHETERIZATION WITH CORONARY ANGIOGRAM;  Surgeon: Peter M Jordan, MD;  Location: [facility] CATH LAB;  Service:  Cardiovascular;  Laterality: N/A;   RIGHT/LEFT HEART CATH AND CORONARY/GRAFT ANGIOGRAPHY N/A 12/18/2023   Procedure: RIGHT/LEFT HEART CATH AND CORONARY/GRAFT ANGIOGRAPHY;  Surgeon: Cherrie Toribio SAUNDERS, MD;  Location: [facility] INVASIVE CV LAB;  Service: Cardiovascular;  Laterality: N/A;   Social History   Socioeconomic History   Marital status: Divorced    Spouse name: Not on file   Number of children: Not on file   Years of education: Not on file   Highest education level: 10th grade  Occupational History   Occupation: Retired  Tobacco Use   Smoking status: Never   Smokeless tobacco: Never  Vaping Use   Vaping status: Never Used  Substance and Sexual Activity   Alcohol  use: Never   Drug use: Never    Comment: marijuana   Sexual activity: Not on file  Other Topics Concern   Not on file  Social History Narrative   ** Merged History Encounter **       Social Drivers of Health   Financial Resource Strain: High Risk (01/16/2024)   Overall Financial Resource Strain (CARDIA)    Difficulty of Paying Living Expenses: Hard  Food Insecurity: Food Insecurity Present (01/16/2024)   Hunger Vital Sign    Worried About Running Out of Food in the Last Year: Sometimes true    Ran Out of Food in the Last Year: Often true  Transportation Needs: No Transportation Needs (01/01/2024)   PRAPARE - Administrator, Civil Service (Medical): No  Lack of Transportation (Non-Medical): No  Physical Activity: Not on file  Stress: Not on file  Social Connections: Moderately Isolated (01/16/2024)   Social Connection and Isolation Panel    Frequency of Communication with Friends and Family: Three times a week    Frequency of Social Gatherings with Friends and Family: Once a week    Attends Religious Services: 1 to 4 times per year    Active Member of [employer] or Organizations: No    Attends Engineer, Structural: Never    Marital Status: Divorced   Family History  Problem Relation Age  of Onset   Heart attack Father    - negative except otherwise stated in the family history section Allergies  Allergen Reactions   Xarelto  [Rivaroxaban ] Other (See Comments)    Bleeding ulcers   Codeine Hives and Itching   Lasix  [Furosemide ] Other (See Comments)    Patient says he gets light headed and dizzy.    Vioxx [Rofecoxib] Palpitations and Other (See Comments)    Caused massive heart attack   Prior to Admission medications   Medication Sig Start Date End Date Taking? Authorizing Provider  albuterol  (VENTOLIN  HFA) 108 (90 Base) MCG/ACT inhaler Inhale 2 puffs into the lungs every 6 (six) hours as needed for wheezing or shortness of breath. 11/22/23   Grooms, Courtney, PA-C  ALPRAZolam  (XANAX ) 1 MG tablet Take 1 tablet (1 mg total) by mouth at bedtime as needed for anxiety. 12/30/23   Grooms, Courtney, PA-C  amiodarone  (PACERONE ) 200 MG tablet Take 1 tablet (200 mg total) by mouth 2 (two) times daily. 01/01/24   Milford, Harlene HERO, FNP  Carboxymethylcellulose Sodium (ARTIFICIAL TEARS OP) Place 1 drop into both eyes 3 (three) times daily.    [provider]  citalopram  (CELEXA ) 20 MG tablet Take 20 mg by mouth daily. 04/05/17   [provider]  Digoxin  62.5 MCG TABS Take 1 tablet (0.0625mg  total) by mouth daily. 12/24/23   Samtani, Jai-Gurmukh, MD  doxycycline  (VIBRA -TABS) 100 MG tablet Take 1 tablet (100 mg total) by mouth 2 (two) times daily. 01/20/24   Glena Harlene HERO, FNP  ELIQUIS  5 MG TABS tablet TAKE ONE TABLET BY MOUTH TWICE DAILY 08/28/18   Lavona Agent, MD  ezetimibe  (ZETIA ) 10 MG tablet Take 10 mg by mouth daily. 06/01/21   [provider]  fluticasone  (FLONASE ) 50 MCG/ACT nasal spray Place 1 spray into both nostrils daily.    [provider]  fluticasone -salmeterol (ADVAIR) 250-50 MCG/ACT AEPB Inhale 1 puff into the lungs in the morning and at bedtime. Patient taking differently: Inhale 1 puff into the lungs daily. 10/21/23   [provider]  JARDIANCE  10 MG TABS tablet Take 10 mg by mouth daily. 06/27/21   [provider]  lansoprazole (PREVACID) 30 MG capsule Take 30 mg by mouth daily.      [provider]  losartan  (COZAAR ) 25 MG tablet Take 0.5 tablets (12.5 mg total) by mouth daily. 12/30/23 03/29/24  Glena Harlene HERO, FNP  metoprolol  succinate (TOPROL -XL) 25 MG 24 hr tablet Take 1 tablet (25 mg total) by mouth daily. 12/24/23   Samtani, Jai-Gurmukh, MD  mexiletine (MEXITIL ) 150 MG capsule Take 1 capsule (150 mg total) by mouth every 12 (twelve) hours. 12/24/23   Samtani, Jai-Gurmukh, MD  Omega-3 Fatty Acids (FISH OIL) 1200 MG CAPS Take 1,200 mg by mouth daily.    [provider]  oxyCODONE -acetaminophen  (PERCOCET) 10-325 MG tablet Take 1 tablet by mouth every 8 (eight)  hours as needed. 01/14/24   Grooms, Courtney, PA-C  potassium chloride  SA (KLOR-CON  M) 20 MEQ tablet Take 1 tablet (20 mEq total) by mouth daily. 01/20/24   Glena Harlene HERO, FNP  rosuvastatin  (CRESTOR ) 40 MG tablet TAKE ONE (1) TABLET EACH DAY 10/18/20   Lavona Agent, MD  spironolactone  (ALDACTONE ) 25 MG tablet Take 1 tablet (25 mg total) by mouth daily. 12/24/23   Samtani, Jai-Gurmukh, MD  tamsulosin  (FLOMAX ) 0.4 MG CAPS capsule Take 1 capsule (0.4 mg total) by mouth daily. 12/24/23   Samtani, Jai-Gurmukh, MD  tiZANidine  (ZANAFLEX ) 4 MG tablet Take 4 mg by mouth every 8 (eight) hours as needed for muscle spasms. 09/29/11   [provider]  torsemide  (DEMADEX ) 20 MG tablet Take 3 tablets (60 mg total) by mouth daily. 01/20/24   Glena Harlene HERO, FNP   DG Ankle Left Port Result Date: 01/29/2024 EXAM: 1 OR 2 VIEW(S) XRAY OF THE LEFT ANKLE 01/29/2024 06:33:00 AM CLINICAL HISTORY: Acute left ankle pain. COMPARISON: None available. FINDINGS: BONES AND JOINTS: No acute fracture. Plantar heel spur. No joint dislocation. SOFT TISSUES: Calcifications noted within the achilles tendon. IMPRESSION: 1. No acute abnormality.  Electronically signed by: Waddell Calk MD 01/29/2024 06:38 AM EST RP Workstation: [id number]   CT Cervical Spine Wo Contrast Result Date: 01/28/2024 CLINICAL DATA:  Status post trauma. EXAM: CT CERVICAL SPINE WITHOUT CONTRAST TECHNIQUE: Multidetector CT imaging of the cervical spine was performed without intravenous contrast. Multiplanar CT image reconstructions were also generated. RADIATION DOSE REDUCTION: This exam was performed according to the departmental dose-optimization program which includes automated exposure control, adjustment of the mA and/or kV according to patient size and/or use of iterative reconstruction technique. COMPARISON:  None Available. FINDINGS: Alignment: There is approximately 2 mm anterolisthesis of the C4 vertebral bodies on C5 and C6 on C7. Skull base and vertebrae: No acute fracture. No primary bone lesion or focal pathologic process. Soft tissues and spinal canal: No prevertebral fluid or swelling. No visible canal hematoma. Disc levels: Marked severity endplate sclerosis of the anterior osteophyte formation of posterior bony spurring are seen at the levels of C5-C6 and C6-C7. There is moderate to marked severity intervertebral disc space narrowing at C3-C4. Marked severity intervertebral disc space narrowing is seen at C5-C6 and C6-C7. Bilateral marked severity multilevel facet joint hypertrophy is noted. Upper chest: Negative. Other: None. IMPRESSION: 1. No acute fracture or traumatic subluxation. 2. Marked severity multilevel degenerative changes, as described above. Electronically Signed   By: Suzen Dials M.D.   On: 01/28/2024 21:39   CT CHEST ABDOMEN PELVIS W CONTRAST Result Date: 01/28/2024 CLINICAL DATA:  Trauma EXAM: CT CHEST, ABDOMEN, AND PELVIS WITH CONTRAST TECHNIQUE: Multidetector CT imaging of the chest, abdomen and pelvis was performed following the standard protocol during bolus administration of intravenous contrast. RADIATION DOSE REDUCTION: This exam  was performed according to the departmental dose-optimization program which includes automated exposure control, adjustment of the mA and/or kV according to patient size and/or use of iterative reconstruction technique. CONTRAST:  75mL OMNIPAQUE  IOHEXOL  350 MG/ML SOLN COMPARISON:  CT abdomen and pelvis 12/12/2023.  CT chest 06/12/2017. FINDINGS: CT CHEST FINDINGS Cardiovascular: Heart is mildly enlarged. Right-sided pacemaker is present. There is no pericardial effusion. Aorta is normal in size. There are atherosclerotic calcifications of the aorta and coronary arteries. Mediastinum/Nodes: No enlarged mediastinal, hilar, or axillary lymph nodes. Thyroid gland, trachea, and esophagus demonstrate no significant findings. Lungs/Pleura: There is atelectasis in the inferior left lower lobe. There is a calcified granuloma  in the right lower lobe. There are healed anterior rib fractures bilaterally. No acute fractures are seen. The lungs are otherwise clear. There is no pleural effusion or pneumothorax. Musculoskeletal: There are healed anterior rib fractures bilaterally. No acute fractures are seen. Sternotomy wires are present. CT ABDOMEN PELVIS FINDINGS Hepatobiliary: No hepatic injury or perihepatic hematoma. Gallbladder is unremarkable. Pancreas: Unremarkable. No pancreatic ductal dilatation or surrounding inflammatory changes. Spleen: No splenic injury or perisplenic hematoma. Adrenals/Urinary Tract: No adrenal hemorrhage or renal injury identified. Bladder is unremarkable. There is a subcentimeter cyst in the inferior pole of the left kidney. Stomach/Bowel: Stomach is within normal limits. Appendix appears normal. No evidence of bowel wall thickening, distention, or inflammatory changes. There is sigmoid colon diverticulosis. Vascular/Lymphatic: Aortic atherosclerosis. No enlarged abdominal or pelvic lymph nodes. Reproductive: Prostate is unremarkable. Other: No ascites or free air. Musculoskeletal: There are mild  compression deformities of L1 and L2 which appear chronic and unchanged. No acute fractures are seen. IMPRESSION: 1. No acute posttraumatic sequelae in the chest, abdomen or pelvis. 2. Mild cardiomegaly. 3. Sigmoid colon diverticulosis. 4. Chronic compression deformities of L1 and L2. 5. Aortic atherosclerosis. Aortic Atherosclerosis (ICD10-I70.0). Electronically Signed   By: Greig Pique M.D.   On: 01/28/2024 21:30   CT Head Wo Contrast Result Date: 01/28/2024 CLINICAL DATA:  Status post trauma. EXAM: CT HEAD WITHOUT CONTRAST TECHNIQUE: Contiguous axial images were obtained from the base of the skull through the vertex without intravenous contrast. RADIATION DOSE REDUCTION: This exam was performed according to the departmental dose-optimization program which includes automated exposure control, adjustment of the mA and/or kV according to patient size and/or use of iterative reconstruction technique. COMPARISON:  December 15, 2023 FINDINGS: Brain: There is mild cerebral atrophy with widening of the extra-axial spaces and ventricular dilatation. There are areas of decreased attenuation within the white matter tracts of the supratentorial brain, consistent with microvascular disease changes. Vascular: Marked severity bilateral cavernous carotid artery calcification. Skull: Normal. Negative for fracture or focal lesion. Sinuses/Orbits: A chronic deformity of the right globe is noted. Other: None. IMPRESSION: 1. No acute intracranial abnormality. 2. Generalized cerebral atrophy and microvascular disease changes of the supratentorial brain. 3. Chronic deformity of the right globe. Electronically Signed   By: Suzen Dials M.D.   On: 01/28/2024 21:28   DG Knee Complete 4 Views Left Result Date: 01/28/2024 CLINICAL DATA:  Status post fall. EXAM: LEFT KNEE - COMPLETE 4+ VIEW COMPARISON:  None Available. FINDINGS: No evidence of an acute fracture or dislocation. Tricompartment joint space narrowing is seen, most  prominent along the medial tibiofemoral compartment space. There is marked severity vascular calcification. A moderate to large suprapatellar effusion is noted. IMPRESSION: 1. Moderate to large suprapatellar effusion. 2. Tricompartment osteoarthritis. Electronically Signed   By: Suzen Dials M.D.   On: 01/28/2024 20:07   DG Hand Complete Right Result Date: 01/28/2024 CLINICAL DATA:  Status post fall. EXAM: RIGHT HAND - COMPLETE 3+ VIEW COMPARISON:  None Available. FINDINGS: There is dorsal dislocation of the PIP joint of the second right finger. An associated acute fracture is not clearly identified. Moderate severity diffuse soft tissue swelling of the second right finger is also noted. Degenerative changes are present throughout the right wrist. IMPRESSION: Dorsal dislocation of the PIP joint of the second right finger. Electronically Signed   By: Suzen Dials M.D.   On: 01/28/2024 20:06   - Positive ROS: All other systems have been reviewed and were otherwise negative with the exception of those  mentioned in the HPI and as above.  Physical Exam: General: No acute distress, resting comfortably Cardiovascular: BUE warm and well perfused, normal rate Respiratory: Normal WOB on RA Skin: Warm and dry Neurologic: Sensation intact distally Psychiatric: Patient is at baseline mood and affect  Right upper Extremity  Right hand index finger with dressing in place, pain is controlled, sensation intact distally, notable laceration over the volar aspect of the PIP    Assessment: 68 year old male with right index finger open dorsal dislocation of the PIP joint  Plan: Given the persistent instability after bedside reduction, patient is indicated for formalized operative stabilization of the right index PIP joint with reduction and pinning  Patient will be admitted to the medical service for observation and preoperative clearance, surgery will be performed later today  Keep n.p.o.  Thank  you for the consult and the opportunity to see Douglas [name], M.D. [facility]

## 2024-01-29 NOTE — ED Notes (Signed)
 RN repositioned pt left leg

## 2024-01-29 NOTE — ED Notes (Signed)
 RN placed pillow under pt knee. Pt states his leg is starting to hurt.

## 2024-01-29 NOTE — ED Notes (Signed)
 RN held urinal for pt for 5 minutes without success. RN informed pt she will come back later to see if he needs to pee

## 2024-01-29 NOTE — H&P (Signed)
 History and Physical    Douglas [name] FMW:[id number] DOB: 19-Oct-1955 DOA: 01/28/2024  PCP: Mancil Pfeiffer, PA-C   Patient coming from: Home   Chief Complaint: Fall with finger injury   HPI: Douglas [name] is a left-handed 68 y.o. male with medical history significant for CAD status post CABG, atrial fibrillation on Eliquis , history of VT, chronic HFrEF with ICD, chronic pain, depression, anxiety, asthma, and CKD 3A who presents with right second finger injury.  Patient reports that he was in his usual state of health and having an uneventful day when he slipped and fell.  He hit the back of his head but did not lose consciousness and denies any significant headache but noted a injury with gross deformity involving his right index finger.  He denies any recent chest pain, shortness of breath, or productive cough.  ED Course: Upon arrival to the ED, patient is found to be afebrile and saturating well on room air with normal RR, normal HR, and stable BP.  Labs are most notable for creatinine 1.74 and normal CBC.  There are no acute findings on CT of the head, cervical spine, chest, abdomen, or pelvis.  Plain radiographs reveal dorsal dislocation of the second right PIP joint and moderate to large suprapatellar effusion on the left.   The PIP remained unstable after attempted bedside reduction in the ED. Hand surgery recommended medical admission and will plan for reduction and stabilization in the OR. Ancef , Tdap, and Dilaudid  were administered.   Review of Systems:  All other systems reviewed and apart from HPI, are negative.  Past Medical History:  Diagnosis Date   A-fib [facility])    AICD (automatic cardioverter/defibrillator) present    Anemia    Asthma    CAD (coronary artery disease)    CHF (congestive heart failure) (HCC)    Diabetes mellitus without complication (HCC)    Dyslipidemia    Elevated cholesterol    Hypertension     Past Surgical History:  Procedure Laterality  Date   CARDIAC CATHETERIZATION  1996   stent placement   CARDIAC DEFIBRILLATOR PLACEMENT     CORONARY ARTERY BYPASS GRAFT  2002   times 4   CORONARY ARTERY BYPASS GRAFT     EYE SURGERY     ICD GENERATOR CHANGEOUT N/A 09/30/2018   Procedure: ICD GENERATOR CHANGEOUT;  Surgeon: Waddell Danelle ORN, MD;  Location: [facility] INVASIVE CV LAB;  Service: Cardiovascular;  Laterality: N/A;   IMPLANTABLE CARDIOVERTER DEFIBRILLATOR (ICD) GENERATOR CHANGE N/A 09/10/2011   Procedure: ICD GENERATOR CHANGE;  Surgeon: Danelle ORN Waddell, MD;  Location: [facility] CATH LAB;  Service: Cardiovascular;  Laterality: N/A;   INSERT / REPLACE / REMOVE PACEMAKER     LEFT HEART CATHETERIZATION WITH CORONARY ANGIOGRAM N/A 08/29/2011   Procedure: LEFT HEART CATHETERIZATION WITH CORONARY ANGIOGRAM;  Surgeon: Peter M Jordan, MD;  Location: [facility] CATH LAB;  Service: Cardiovascular;  Laterality: N/A;   RIGHT/LEFT HEART CATH AND CORONARY/GRAFT ANGIOGRAPHY N/A 12/18/2023   Procedure: RIGHT/LEFT HEART CATH AND CORONARY/GRAFT ANGIOGRAPHY;  Surgeon: Cherrie Toribio SAUNDERS, MD;  Location: [facility] INVASIVE CV LAB;  Service: Cardiovascular;  Laterality: N/A;    Social History:   reports that he has never smoked. He has never used smokeless tobacco. He reports that he does not drink alcohol  and does not use drugs.  Allergies  Allergen Reactions   Xarelto  [Rivaroxaban ] Other (See Comments)    Bleeding ulcers   Codeine Hives and Itching   Lasix  [Furosemide ] Other (See Comments)  Patient says he gets light headed and dizzy.    Vioxx [Rofecoxib] Palpitations and Other (See Comments)    Caused massive heart attack    Family History  Problem Relation Age of Onset   Heart attack Father      Prior to Admission medications   Medication Sig Start Date End Date Taking? Authorizing Provider  albuterol  (VENTOLIN  HFA) 108 (90 Base) MCG/ACT inhaler Inhale 2 puffs into the lungs every 6 (six) hours as needed for wheezing or shortness of breath. 11/22/23   Grooms,  Courtney, PA-C  ALPRAZolam  (XANAX ) 1 MG tablet Take 1 tablet (1 mg total) by mouth at bedtime as needed for anxiety. 12/30/23   Grooms, Courtney, PA-C  amiodarone  (PACERONE ) 200 MG tablet Take 1 tablet (200 mg total) by mouth 2 (two) times daily. 01/01/24   Milford, Harlene HERO, FNP  Carboxymethylcellulose Sodium (ARTIFICIAL TEARS OP) Place 1 drop into both eyes 3 (three) times daily.    [provider]  citalopram  (CELEXA ) 20 MG tablet Take 20 mg by mouth daily. 04/05/17   [provider]  Digoxin  62.5 MCG TABS Take 1 tablet (0.0625mg  total) by mouth daily. 12/24/23   Samtani, Jai-Gurmukh, MD  doxycycline  (VIBRA -TABS) 100 MG tablet Take 1 tablet (100 mg total) by mouth 2 (two) times daily. 01/20/24   Glena Harlene HERO, FNP  ELIQUIS  5 MG TABS tablet TAKE ONE TABLET BY MOUTH TWICE DAILY 08/28/18   Lavona Agent, MD  ezetimibe  (ZETIA ) 10 MG tablet Take 10 mg by mouth daily. 06/01/21   [provider]  fluticasone  (FLONASE ) 50 MCG/ACT nasal spray Place 1 spray into both nostrils daily.    [provider]  fluticasone -salmeterol (ADVAIR) 250-50 MCG/ACT AEPB Inhale 1 puff into the lungs in the morning and at bedtime. Patient taking differently: Inhale 1 puff into the lungs daily. 10/21/23   [provider]  JARDIANCE  10 MG TABS tablet Take 10 mg by mouth daily. 06/27/21   [provider]  lansoprazole (PREVACID) 30 MG capsule Take 30 mg by mouth daily.      [provider]  losartan  (COZAAR ) 25 MG tablet Take 0.5 tablets (12.5 mg total) by mouth daily. 12/30/23 03/29/24  Glena Harlene HERO, FNP  metoprolol  succinate (TOPROL -XL) 25 MG 24 hr tablet Take 1 tablet (25 mg total) by mouth daily. 12/24/23   Samtani, Jai-Gurmukh, MD  mexiletine (MEXITIL ) 150 MG capsule Take 1 capsule (150 mg total) by mouth every 12 (twelve) hours. 12/24/23   Samtani, Jai-Gurmukh, MD  Omega-3 Fatty Acids (FISH OIL) 1200 MG CAPS Take 1,200 mg by mouth daily.    [provider]  oxyCODONE -acetaminophen  (PERCOCET) 10-325 MG tablet Take 1 tablet by mouth every 8 (eight) hours as needed. 01/14/24   Grooms, Courtney, PA-C  potassium chloride  SA (KLOR-CON  M) 20 MEQ tablet Take 1 tablet (20 mEq total) by mouth daily. 01/20/24   Glena Harlene HERO, FNP  rosuvastatin  (CRESTOR ) 40 MG tablet TAKE ONE (1) TABLET EACH DAY 10/18/20   Lavona Agent, MD  spironolactone  (ALDACTONE ) 25 MG tablet Take 1 tablet (25 mg total) by mouth daily. 12/24/23   Samtani, Jai-Gurmukh, MD  tamsulosin  (FLOMAX ) 0.4 MG CAPS capsule Take 1 capsule (0.4 mg total) by mouth daily. 12/24/23   Samtani, Jai-Gurmukh, MD  tiZANidine  (ZANAFLEX ) 4 MG tablet Take 4 mg by mouth every 8 (eight) hours as needed for muscle spasms. 09/29/11   [provider]  torsemide  (DEMADEX ) 20 MG tablet Take 3 tablets (60 mg total) by mouth  daily. 01/20/24   Glena Harlene HERO, FNP    Physical Exam: Vitals:   01/29/24 0145 01/29/24 0154 01/29/24 0215 01/29/24 0230  BP: 109/89  (!) 141/80 137/75  Pulse: 76  71 79  Resp: 18  17 16   Temp:  97.9 F (36.6 C)    TempSrc:  Oral    SpO2: 100%  95% 95%  Weight:      Height:        Constitutional: NAD, no pallor or diaphoresis   Eyes: PERTLA, right eye deformity  ENMT: Mucous membranes are moist. Posterior pharynx clear of any exudate or lesions.   Neck: supple, no masses  Respiratory: no wheezing, no crackles. No accessory muscle use.  Cardiovascular: S1 & S2 heard, regular rate and rhythm. No JVD. Abdomen: No tenderness, soft. Bowel sounds active.  Musculoskeletal: no clubbing / cyanosis. Right second finger deformity. Left anterior knee swelling and tenderness.   Skin: no significant rashes, lesions, ulcers. Warm, dry, well-perfused. Neurologic: CN 2-12 grossly intact aside from right eye.  Moving all extremities. Alert and oriented.  Psychiatric: Calm. Cooperative.    Labs and Imaging on Admission: I have personally reviewed following labs and  imaging studies  CBC: Recent Labs  Lab 01/28/24 1901  WBC 8.6  NEUTROABS 6.5  HGB 16.1  HCT 51.5  MCV 85.5  PLT 309   Basic Metabolic Panel: Recent Labs  Lab 01/28/24 1901  NA 137  K 3.8  CL 95*  CO2 26  GLUCOSE 111*  BUN 40*  CREATININE 1.74*  CALCIUM  9.4   GFR: Estimated Creatinine Clearance: 49.4 mL/min (A) (by C-G formula based on SCr of 1.74 mg/dL (H)). Liver Function Tests: No results for input(s): AST, ALT, ALKPHOS, BILITOT, PROT, ALBUMIN in the last 168 hours. No results for input(s): LIPASE, AMYLASE in the last 168 hours. No results for input(s): AMMONIA in the last 168 hours. Coagulation Profile: No results for input(s): INR, PROTIME in the last 168 hours. Cardiac Enzymes: No results for input(s): CKTOTAL, CKMB, CKMBINDEX, TROPONINI in the last 168 hours. BNP (last 3 results) Recent Labs    12/12/23 1234  PROBNP 5,740.0*   HbA1C: No results for input(s): HGBA1C in the last 72 hours. CBG: No results for input(s): GLUCAP in the last 168 hours. Lipid Profile: No results for input(s): CHOL, HDL, LDLCALC, TRIG, CHOLHDL, LDLDIRECT in the last 72 hours. Thyroid Function Tests: No results for input(s): TSH, T4TOTAL, FREET4, T3FREE, THYROIDAB in the last 72 hours. Anemia Panel: No results for input(s): VITAMINB12, FOLATE, FERRITIN, TIBC, IRON, RETICCTPCT in the last 72 hours. Urine analysis:    Component Value Date/Time   COLORURINE YELLOW 12/13/2023 1756   APPEARANCEUR HAZY (A) 12/13/2023 1756   LABSPEC 1.021 12/13/2023 1756   PHURINE 5.0 12/13/2023 1756   GLUCOSEU 50 (A) 12/13/2023 1756   HGBUR NEGATIVE 12/13/2023 1756   BILIRUBINUR NEGATIVE 12/13/2023 1756   KETONESUR NEGATIVE 12/13/2023 1756   PROTEINUR 100 (A) 12/13/2023 1756   UROBILINOGEN 0.2 08/25/2013 0122   NITRITE NEGATIVE 12/13/2023 1756   LEUKOCYTESUR NEGATIVE 12/13/2023 1756   Sepsis  Labs: @LABRCNTIP (procalcitonin:4,lacticidven:4) )No results found for this or any previous visit (from the past 240 hours).   Radiological Exams on Admission: CT Cervical Spine Wo Contrast Result Date: 01/28/2024 CLINICAL DATA:  Status post trauma. EXAM: CT CERVICAL SPINE WITHOUT CONTRAST TECHNIQUE: Multidetector CT imaging of the cervical spine was performed without intravenous contrast. Multiplanar CT image reconstructions were also generated. RADIATION DOSE REDUCTION: This exam was performed according to  the departmental dose-optimization program which includes automated exposure control, adjustment of the mA and/or kV according to patient size and/or use of iterative reconstruction technique. COMPARISON:  None Available. FINDINGS: Alignment: There is approximately 2 mm anterolisthesis of the C4 vertebral bodies on C5 and C6 on C7. Skull base and vertebrae: No acute fracture. No primary bone lesion or focal pathologic process. Soft tissues and spinal canal: No prevertebral fluid or swelling. No visible canal hematoma. Disc levels: Marked severity endplate sclerosis of the anterior osteophyte formation of posterior bony spurring are seen at the levels of C5-C6 and C6-C7. There is moderate to marked severity intervertebral disc space narrowing at C3-C4. Marked severity intervertebral disc space narrowing is seen at C5-C6 and C6-C7. Bilateral marked severity multilevel facet joint hypertrophy is noted. Upper chest: Negative. Other: None. IMPRESSION: 1. No acute fracture or traumatic subluxation. 2. Marked severity multilevel degenerative changes, as described above. Electronically Signed   By: Suzen Dials M.D.   On: 01/28/2024 21:39   CT CHEST ABDOMEN PELVIS W CONTRAST Result Date: 01/28/2024 CLINICAL DATA:  Trauma EXAM: CT CHEST, ABDOMEN, AND PELVIS WITH CONTRAST TECHNIQUE: Multidetector CT imaging of the chest, abdomen and pelvis was performed following the standard protocol during bolus  administration of intravenous contrast. RADIATION DOSE REDUCTION: This exam was performed according to the departmental dose-optimization program which includes automated exposure control, adjustment of the mA and/or kV according to patient size and/or use of iterative reconstruction technique. CONTRAST:  75mL OMNIPAQUE  IOHEXOL  350 MG/ML SOLN COMPARISON:  CT abdomen and pelvis 12/12/2023.  CT chest 06/12/2017. FINDINGS: CT CHEST FINDINGS Cardiovascular: Heart is mildly enlarged. Right-sided pacemaker is present. There is no pericardial effusion. Aorta is normal in size. There are atherosclerotic calcifications of the aorta and coronary arteries. Mediastinum/Nodes: No enlarged mediastinal, hilar, or axillary lymph nodes. Thyroid gland, trachea, and esophagus demonstrate no significant findings. Lungs/Pleura: There is atelectasis in the inferior left lower lobe. There is a calcified granuloma in the right lower lobe. There are healed anterior rib fractures bilaterally. No acute fractures are seen. The lungs are otherwise clear. There is no pleural effusion or pneumothorax. Musculoskeletal: There are healed anterior rib fractures bilaterally. No acute fractures are seen. Sternotomy wires are present. CT ABDOMEN PELVIS FINDINGS Hepatobiliary: No hepatic injury or perihepatic hematoma. Gallbladder is unremarkable. Pancreas: Unremarkable. No pancreatic ductal dilatation or surrounding inflammatory changes. Spleen: No splenic injury or perisplenic hematoma. Adrenals/Urinary Tract: No adrenal hemorrhage or renal injury identified. Bladder is unremarkable. There is a subcentimeter cyst in the inferior pole of the left kidney. Stomach/Bowel: Stomach is within normal limits. Appendix appears normal. No evidence of bowel wall thickening, distention, or inflammatory changes. There is sigmoid colon diverticulosis. Vascular/Lymphatic: Aortic atherosclerosis. No enlarged abdominal or pelvic lymph nodes. Reproductive: Prostate is  unremarkable. Other: No ascites or free air. Musculoskeletal: There are mild compression deformities of L1 and L2 which appear chronic and unchanged. No acute fractures are seen. IMPRESSION: 1. No acute posttraumatic sequelae in the chest, abdomen or pelvis. 2. Mild cardiomegaly. 3. Sigmoid colon diverticulosis. 4. Chronic compression deformities of L1 and L2. 5. Aortic atherosclerosis. Aortic Atherosclerosis (ICD10-I70.0). Electronically Signed   By: Greig Pique M.D.   On: 01/28/2024 21:30   CT Head Wo Contrast Result Date: 01/28/2024 CLINICAL DATA:  Status post trauma. EXAM: CT HEAD WITHOUT CONTRAST TECHNIQUE: Contiguous axial images were obtained from the base of the skull through the vertex without intravenous contrast. RADIATION DOSE REDUCTION: This exam was performed according to the departmental dose-optimization program  which includes automated exposure control, adjustment of the mA and/or kV according to patient size and/or use of iterative reconstruction technique. COMPARISON:  December 15, 2023 FINDINGS: Brain: There is mild cerebral atrophy with widening of the extra-axial spaces and ventricular dilatation. There are areas of decreased attenuation within the white matter tracts of the supratentorial brain, consistent with microvascular disease changes. Vascular: Marked severity bilateral cavernous carotid artery calcification. Skull: Normal. Negative for fracture or focal lesion. Sinuses/Orbits: A chronic deformity of the right globe is noted. Other: None. IMPRESSION: 1. No acute intracranial abnormality. 2. Generalized cerebral atrophy and microvascular disease changes of the supratentorial brain. 3. Chronic deformity of the right globe. Electronically Signed   By: Suzen Dials M.D.   On: 01/28/2024 21:28   DG Knee Complete 4 Views Left Result Date: 01/28/2024 CLINICAL DATA:  Status post fall. EXAM: LEFT KNEE - COMPLETE 4+ VIEW COMPARISON:  None Available. FINDINGS: No evidence of an acute  fracture or dislocation. Tricompartment joint space narrowing is seen, most prominent along the medial tibiofemoral compartment space. There is marked severity vascular calcification. A moderate to large suprapatellar effusion is noted. IMPRESSION: 1. Moderate to large suprapatellar effusion. 2. Tricompartment osteoarthritis. Electronically Signed   By: Suzen Dials M.D.   On: 01/28/2024 20:07   DG Hand Complete Right Result Date: 01/28/2024 CLINICAL DATA:  Status post fall. EXAM: RIGHT HAND - COMPLETE 3+ VIEW COMPARISON:  None Available. FINDINGS: There is dorsal dislocation of the PIP joint of the second right finger. An associated acute fracture is not clearly identified. Moderate severity diffuse soft tissue swelling of the second right finger is also noted. Degenerative changes are present throughout the right wrist. IMPRESSION: Dorsal dislocation of the PIP joint of the second right finger. Electronically Signed   By: Suzen Dials M.D.   On: 01/28/2024 20:06    EKG: Independently reviewed. Atrial fibrillation, non-specific IVCD.   Assessment/Plan   1. Open dislocation of 2nd PIP on right hand  - Remains unstable after attempted bedside reduction in ED  - Tdap and Ancef  given in ED  - Patient appears optimized for surgery  - Continue NPO, pain-control, supportive care    2. Left knee injury  - Moderate to large suprapatellar effusion noted on plain radiographs but no fracture or dislocation  - He has significant pain and difficulty bearing weight  - Check MRI, continue supportive care   3. Chronic HFrEF  - Appears compensated, has ICD  - Continue torsemide , Aldactone , beta-blocker    4. CAD  - No anginal symptoms  - Bypass grafts were patent on cath in October 2025  - Continue metoprolol  and Crestor , hold Eliquis  for now    5. Atrial fibrillation, hx of VT  - Hold Eliquis  in anticipation of surgery, continue Toprol , amiodarone , mexiletine, and digoxin     6. CKD 3A  -  Appears close to baseline  - Renally-dose medications    7. Asthma  - Not in exacerbation  - Continue ICS-LABA and as-needed albuterol     8. Depression, anxiety - Continue Celexa  and as-needed Xanax      DVT prophylaxis: SCDs  Code Status: Full  Level of Care: Level of care: Med-Surg Family Communication: None present  Disposition Plan:  Patient is from: Home  Anticipated d/c is to: Home  Anticipated d/c date is: Possibly as early as 12/3 or 01/30/24 Patient currently: pending hand surgery consultation and likely finger reduction in OR  Consults called: Hand surgery Admission status: Observation  Otha Rickles S Major [place], MD [facility]  01/29/2024, 2:50 AM

## 2024-01-29 NOTE — Anesthesia Preprocedure Evaluation (Addendum)
 Anesthesia Evaluation  Patient identified by MRN, date of birth, ID band Patient awake    Reviewed: Allergy & Precautions, NPO status , Patient's Chart, lab work & pertinent test results, reviewed documented beta blocker date and time   History of Anesthesia Complications Negative for: history of anesthetic complications  Airway Mallampati: III  TM Distance: >3 FB Neck ROM: Full    Dental  (+) Dental Advisory Given, Teeth Intact   Pulmonary asthma    Pulmonary exam normal        Cardiovascular hypertension, Pt. on medications and Pt. on home beta blockers + CAD, + CABG and +CHF  Normal cardiovascular exam+ dysrhythmias Atrial Fibrillation + Cardiac Defibrillator    '25 Cath - 1. 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 Continues patency of all 4 bypass grafts 3. Severe biventricular failure with severely decreased cardiac output with elevated filling pressures  '25 TTE - EF 30 to 35%. The left ventricular internal cavity size was moderately to severely dilated. Left atrial size was mildly dilated. Mild mitral valve regurgitation.     Neuro/Psych  PSYCHIATRIC DISORDERS Anxiety Depression    negative neurological ROS     GI/Hepatic Neg liver ROS,GERD  Medicated and Controlled,,  Endo/Other  diabetes, Type 2, Oral Hypoglycemic Agents   Obesity   Renal/GU CRFRenal disease     Musculoskeletal negative musculoskeletal ROS (+)    Abdominal   Peds  Hematology negative hematology ROS (+)   Anesthesia Other Findings   Reproductive/Obstetrics                              Anesthesia Physical Anesthesia Plan  ASA: 3  Anesthesia Plan: General   Post-op Pain Management: Tylenol  PO (pre-op )*   Induction: Intravenous  PONV Risk Score and Plan: 2 and Treatment may vary due to age or medical condition, Ondansetron  and Dexamethasone  Airway Management Planned: LMA  Additional Equipment:  None  Intra-op Plan:   Post-operative Plan: Extubation in OR  Informed Consent: I have reviewed the patients History and Physical, chart, labs and discussed the procedure including the risks, benefits and alternatives for the proposed anesthesia with the patient or authorized representative who has indicated his/her understanding and acceptance.     Dental advisory given  Plan Discussed with: CRNA, Anesthesiologist and Surgeon  Anesthesia Plan Comments: (Magnet available for AICD )         Anesthesia Quick Evaluation

## 2024-01-29 NOTE — ED Notes (Signed)
 Rewrapped pts R index finger with Xeroform and gauze. No c/o of pain at this time.

## 2024-01-30 ENCOUNTER — Encounter (HOSPITAL_COMMUNITY): Payer: Self-pay | Admitting: Orthopedic Surgery

## 2024-01-30 LAB — BASIC METABOLIC PANEL WITH GFR
Anion gap: 11 (ref 5–15)
BUN: 27 mg/dL — ABNORMAL HIGH (ref 8–23)
CO2: 27 mmol/L (ref 22–32)
Calcium: 9 mg/dL (ref 8.9–10.3)
Chloride: 96 mmol/L — ABNORMAL LOW (ref 98–111)
Creatinine, Ser: 1.37 mg/dL — ABNORMAL HIGH (ref 0.61–1.24)
GFR, Estimated: 56 mL/min — ABNORMAL LOW (ref >60)
Glucose, Bld: 121 mg/dL — ABNORMAL HIGH (ref 70–99)
Potassium: 3.9 mmol/L (ref 3.5–5.1)
Sodium: 134 mmol/L — ABNORMAL LOW (ref 135–145)

## 2024-01-30 LAB — CBC
HCT: 50.1 % (ref 39.0–52.0)
Hemoglobin: 15.6 g/dL (ref 13.0–17.0)
MCH: 26.7 pg (ref 26.0–34.0)
MCHC: 31.1 g/dL (ref 30.0–36.0)
MCV: 85.6 fL (ref 80.0–100.0)
Platelets: 241 K/uL (ref 150–400)
RBC: 5.85 MIL/uL — ABNORMAL HIGH (ref 4.22–5.81)
RDW: 22 % — ABNORMAL HIGH (ref 11.5–15.5)
WBC: 11.2 K/uL — ABNORMAL HIGH (ref 4.0–10.5)
nRBC: 0 % (ref 0.0–0.2)

## 2024-01-30 MED ORDER — ORAL CARE MOUTH RINSE: 15.0000 mL | OROMUCOSAL | Status: DC | PRN

## 2024-01-30 MED ORDER — PROCHLORPERAZINE EDISYLATE 10 MG/2ML IJ SOLN
10.0000 mg | Freq: Four times a day (QID) | INTRAMUSCULAR | Status: DC | PRN
  Administered 2024-01-30 – 2024-02-01 (×6): 10 mg via INTRAVENOUS
  Filled 2024-01-30 (×7): qty 2

## 2024-01-30 NOTE — Progress Notes (Signed)
 OT Cancellation Note  Patient Details Name: RYANN LEAVITT MRN: [id number] DOB: 10-09-1955   Cancelled Treatment:    Reason Eval/Treat Not Completed: Fatigue/lethargy limiting ability to participate (pt sleeping soundly after medication for signficant pain, did not disturb). Will continue to follow.  Kennth Mliss Helling 01/30/2024, 3:38 PM Mliss HERO, OTR/L Acute Rehabilitation Services Office: [phone]

## 2024-01-30 NOTE — Plan of Care (Signed)

## 2024-01-30 NOTE — Progress Notes (Signed)
 Progress Note   Patient: Douglas [name] FMW:[id number] DOB: [birth year] DOA: 01/28/2024     1 DOS: the patient was seen and examined on 01/30/2024   Brief hospital course: The patient is a 68 yr old man who presented to [facility] ED after a fall resulting in injury in the right second finger. He carries a past medical history significant for status post CABG, A-fib on Eliquis , history of VT, HFrEF with ICD, CKD stage III. He underwent right index finger reduction and stabilization of PEP index finger dislocation by hand surgery on 01/29/2024.   The patient also has an injury to his left knee with pain and swelling CT of the knee was obtained which demonstrated moderate volume lipohemarthrosis with a non-displaced fracture of the posterolateral tibial plateau with approximately 3 mm of articular surface depression posteriorly. There is mild tricompartmental osteoarthritis. Ligaments were suboptimally evaluated by CT. Muscles are within normal limits. Quadriceps and patellar tendons are intact. A Baker's cyst is noted. The knee has been evaluted by orthopedic surgery. They plan for non-operative management with NWG LLE. He will follow up with Dr. Sherida for this in 2 weeks.   He is resting comfortably today.  Assessment and Plan: 1. Open dislocation of 2nd PIP on right hand  - Remains unstable after attempted bedside reduction in ED  - Tdap and Ancef  given in ED  - S/P right index finger reduction and stabilization of PEP index finger dislocation by hand surgery on 01/29/2024.  - Continue pain-control, supportive care   - Per Dr. Roselynn; the  patient will remain NWB on the right upper extremity in a clamshell splint. The dressing is to stay clear and dry until follow-up - The patient will follow up with Dr. Janus in the office in 1 week for post-operative follow up.   2. Left knee injury  - Moderate to large suprapatellar effusion with non-displaced fracture of the posterolateral tibial plaeau with 3 mm  of articular surface depression posteriorly. - Plan is for non-operative management with NWB LLE - FU with Dr. Sherida in 2 weeks.   3. Chronic HFrEF  - Appears compensated, has ICD  - Continue torsemide , Aldactone , beta-blocker   - stable   4. CAD  - No anginal symptoms  - Bypass grafts were patent on cath in October 2025  - Continue metoprolol  and Crestor , hold Eliquis  for now     5. Atrial fibrillation, hx of VT  - Hold Eliquis  in anticipation of surgery, continue Toprol , amiodarone , mexiletine, and digoxin    - Will restart eliquis  at discharge.   6. CKD 3A  - Appears close to baseline  - Creatinine 1.63 on admission, now 1.37 - Renally-dose medications     7. Asthma  - Not in exacerbation  - Continue ICS-LABA and as-needed albuterol      8. Depression, anxiety - Continue Celexa  and as-needed Xanax     9. Agitation/aggression Nursing is reporting agitation, aggression and inappropriate behavior to staff and [name]. They have requested psychiatric consult. This has been ordered.     DVT prophylaxis: SCDs  Code Status: Full  Level of Care: Level of care: Med-Surg Family Communication: None present  Disposition Plan:  Patient is from: Home  Anticipated d/c is to: Home  Anticipated d/c date is: 12/5 - 12/6 Patient currently: pending psychiatric evaluation Consults called: Hand surgery, Orthopedic surgery Admission status: Observation     Subjective: The patient is resting comfortably in bed.   Physical Exam: Vitals:   01/30/24 0456 01/30/24  1000 01/30/24 1055 01/30/24 1528  BP:  129/72  (!) 98/50  Pulse:  89 89 76  Resp:    18  Temp:  99.6 F (37.6 C)  100 F (37.8 C)  TempSrc:  Oral  Oral  SpO2:  93%  92%  Weight: 109.3 kg     Height:       Exam:  Constitutional:  The patient is awake, alert, and oriented x 3. No acute distress. Eyes:  pupils and irises appear normal Normal lids and conjunctivae ENMT:  grossly normal hearing  Lips appear  normal external ears, nose appear normal Oropharynx: mucosa, tongue,posterior pharynx appear normal Neck:  neck appears normal, no masses, normal ROM, supple no thyromegaly Respiratory:  No increased work of breathing. No wheezes, rales, or rhonchi No tactile fremitus Cardiovascular:  Regular rate and rhythm No murmurs, ectopy, or gallups. No lateral PMI. No thrills. Abdomen:  Abdomen is soft, non-tender, non-distended No hernias, masses, or organomegaly Normoactive bowel sounds.  Musculoskeletal:  No cyanosis, clubbing, or edema Left knee is swollen and painful to touch Right hand is bandaged. Skin:  No rashes, lesions, ulcers palpation of skin: no induration or nodules Neurologic:  CN 2-12 intact Sensation all 4 extremities intact Psychiatric:  Mental status Mood, affect appropriate Orientation to person, place, time  Per nursing the patient is aggressive and agitated as well as making inappropriate comments to staff. Complaints also from [name].  Data Reviewed:  CBC, CMP, CT Knee  Family Communication: None available  Disposition: Status is: Inpatient Remains inpatient appropriate because: Need for psychiatric evaluation.   Planned Discharge Destination: Skilled nursing facility    Time spent: 38 minutes  Author: Aleksis Jiggetts, DO 01/30/2024 3:51 PM  For on call review [email or web address].

## 2024-01-30 NOTE — Anesthesia Postprocedure Evaluation (Signed)
 Anesthesia Post Note  Patient: Douglas [name]  Procedure(s) Performed: OPEN REDUCTION INTERNAL FIXATION (ORIF) FINGER WITH RADIAL BONE GRAFT (Right) CLOSED REDUCTION, FINGER, WITH PERCUTANEOUS PINNING (Right: Finger)     Patient location during evaluation: PACU Anesthesia Type: General Level of consciousness: awake and alert Pain management: pain level controlled Vital Signs Assessment: post-procedure vital signs reviewed and stable Respiratory status: spontaneous breathing, nonlabored ventilation and respiratory function stable Cardiovascular status: stable and blood pressure returned to baseline Anesthetic complications: no   No notable events documented.  Last Vitals:  Vitals:   01/30/24 1000 01/30/24 1055  BP: 129/72   Pulse: 89 89  Resp:    Temp: 37.6 C   SpO2: 93%     Last Pain:  Vitals:   01/30/24 1000  TempSrc: Oral  PainSc:                  Debby FORBES Like

## 2024-01-30 NOTE — TOC CAGE-AID Note (Signed)
 Transition of Care [facility]) - CAGE-AID Screening   Patient Details  Name: Douglas [name] MRN: [id number] Date of Birth: [birth year]  [facility]) CM/SW Contact:    Kapena Hamme E Satia Winger, LCSW Phone Number: 01/30/2024, 10:05 AM   Clinical Narrative: Patient reports he used Marijuana in the past, but denies current substance use or resource needs.    CAGE-AID Screening:    Have You Ever Felt You Ought to Cut Down on Your Drinking or Drug Use?: No Have People Annoyed You By Critizing Your Drinking Or Drug Use?: No Have You Felt Bad Or Guilty About Your Drinking Or Drug Use?: No Have You Ever Had a Drink or Used Drugs First Thing In The Morning to Steady Your Nerves or to Get Rid of a Hangover?: No CAGE-AID Score: 0  Substance Abuse Education Offered: Yes

## 2024-01-30 NOTE — TOC Initial Note (Addendum)
 Transition of Care [facility]) - Initial/Assessment Note    Patient Details  Name: Douglas [name] MRN: [id number] Date of Birth: Sep 28, 1955  Transition of Care [facility]) CM/SW Contact:    Lauraine FORBES Saa, LCSWA Phone Number: 01/30/2024, 2:57 PM  Clinical Narrative:                  2:57 PM CSW made aware that patient is agreeable with therapy's recommendation of discharging to SNF. CSW sent patient's FL2 to SNFs in [facility]. Per chart review, patient resides at home alone. Patient has a PCP and insurance. Patient is active with [facility] Complex Care Management. [facility] CSW's informed this CSW that they recently provided patient with SDOH resources. Patient has SNF history at [facility] in [place]. Patient is currently active with [facility]. Patient has prior HH history with [facility]. Patient has DME (hospital bed, oxygen, RW, cane) history with [employer]. Patient's preferred pharmacy's are [facility] and [facility]. CSW will continue to follow.  4:17 PM CSW left patient's SNF options [facility], [address], [address], [place], [place]) with their quarterly Medicare ratings at patient's bedside. CSW attempted to wake patient but was unable to. CSW will continue to follow.  Expected Discharge Plan: Skilled Nursing Facility Barriers to Discharge: Continued Medical Work up, English As A Second Language Teacher, SNF Pending bed offer   Patient Goals and CMS Choice Patient states their goals for this hospitalization and ongoing recovery are:: SNF          Expected Discharge Plan and Services In-house Referral: Clinical Social Work   Post Acute Care Choice: Skilled Nursing Facility Living arrangements for the past 2 months: Single Family Home                                      Prior Living Arrangements/Services Living arrangements for the past 2 months:  Single Family Home Lives with:: Self Patient language and need for interpreter reviewed:: Yes        Need for Family Participation in Patient Care: No (Comment) Care giver support system in place?: No (comment) Current home services: DME, Home PT Criminal Activity/Legal Involvement Pertinent to Current Situation/Hospitalization: No - Comment as needed  Activities of Daily Living   ADL Screening (condition at time of admission) Independently performs ADLs?: Yes (appropriate for developmental age) Is the patient deaf or have difficulty hearing?: No Does the patient have difficulty seeing, even when wearing glasses/contacts?: No Does the patient have difficulty concentrating, remembering, or making decisions?: No  Permission Sought/Granted Permission sought to share information with : Family Supports, Oceanographer granted to share information with : No (Contact information on chart)  Share Information with NAME: [name] Muzzy  Permission granted to share info w AGENCY: SNF  Permission granted to share info w Relationship: Neighbor  Permission granted to share info w Contact Information: [phone]  Emotional Assessment       Orientation: : Oriented to Self, Oriented to Place, Oriented to  Time, Oriented to Situation Alcohol  / Substance Use: Not Applicable Psych Involvement: No (comment)  Admission diagnosis:  Open dislocation of proximal interphalangeal (PIP) joint of finger [S63.289A, S61.209A] Open dislocation of proximal interphalangeal (PIP) joint of finger of right hand [D36.710J, S61.209A] Patient Active Problem List   Diagnosis Date Noted   Open dislocation of proximal interphalangeal (PIP)  joint of finger of right hand 01/29/2024   Left knee injury 01/29/2024   PVC's (premature ventricular contractions) 01/20/2024   Venous stasis ulcers of both lower extremities (HCC) 01/14/2024   Epistaxis 01/14/2024   Acute hypoxemic respiratory  failure (HCC) 12/12/2023   Ventricular tachycardia (HCC) 12/12/2023   Frequent falls 11/14/2023   Acquired thrombophilia 05/28/2023   Prolonged QT interval 04/23/2023   Chronic systolic CHF (congestive heart failure) (HCC) 04/23/2023   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 06/09/2020   Moderate persistent asthma without complication 06/09/2020   Stage 3a chronic kidney disease (HCC) 06/09/2019   Biventricular failure (HCC) 06/10/2017   Hyperosmolarity syndrome 06/10/2017   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   Coronary artery disease 06/02/2017   Diverticulosis 06/02/2017   Vertebral compression fracture (HCC) 06/02/2017   Spondylosis of cervical spine 06/02/2017   Coagulopathy 06/02/2017   Allergic rhinitis 12/27/2014   Chronic low back pain 12/27/2014   Gastroesophageal reflux disease without esophagitis 12/27/2014   Generalized anxiety disorder 12/27/2014   Hyperlipidemia 12/27/2014   DJD (degenerative joint disease), lumbosacral 08/26/2013   Atrial fibrillation, chronic (HCC) 08/25/2013   History of Substance abuse 08/25/2013   Essential hypertension 05/01/2010   Cardiomyopathy, ischemic 05/01/2010   ICD (implantable cardioverter-defibrillator) in place 05/01/2010   PCP:  Grooms, Brewster, PA-C Pharmacy:   [facility] [place], [place] - [address] [place] [address] Phone: [phone] Fax: [phone]  [facility] [address] [place] [address] Phone: [phone] Fax: [phone]     Social Drivers of Health (SDOH) Social History: SDOH Screenings   Food Insecurity: Food Insecurity Present (01/29/2024)  Housing: Low Risk  (01/29/2024)  Transportation Needs: No Transportation Needs (01/29/2024)  Utilities: At Risk (01/29/2024)  Depression (PHQ2-9): High Risk (01/16/2024)  Financial Resource Strain: High Risk (01/16/2024)  Social Connections: Moderately  Isolated (01/29/2024)  Tobacco Use: Low Risk  (01/29/2024)  Health Literacy: Inadequate Health Literacy (01/16/2024)   SDOH Interventions:     Readmission Risk Interventions    05/31/2023   11:35 AM 05/30/2023   10:20 AM  Readmission Risk Prevention Plan  Transportation Screening Complete Complete  Home Care Screening Complete Complete  Medication Review (RN CM) Complete Complete

## 2024-01-30 NOTE — Evaluation (Signed)
 Physical Therapy Evaluation Patient Details Name: Douglas [name] MRN: [id number] DOB: 25-Apr-1955 Today's Date: 01/30/2024  History of Present Illness  68 y.o. M admitted 12/2 due to R index finger PIP dislocation and L tibial plateau fx after a fall. Pt s/p R index finger PIP capsulotomy and open reduction and stabilization with pinning. MHx: obesity, T2DM, HTN, HLD, Afib on Eliquis , CAD s/p CABG, ICM, CHF, AICD, anemia, asthma, opiate dependence, R eye blindness   Clinical Impression  PTA, pt living in single level mobile home by himself with 3-4 STE. He is typically independent with ADLs, drives, and walks with RW. Currently, pt is maxA+2 for bed mobility and STS transfer. Describes 9/10 pain in L knee that inc with any movement, and pt struggled to maintain WB precautions during STS with R platform walker. Pt expressed understanding and desire to get rehabilitation following his hospital stay, as he has experienced a significant decline from his functional baseline. Recommending post-acute rehab <3hrs/day to improve activity tolerance and functional mobility while maintaining NWB status. Acute PT to follow.       If plan is discharge home, recommend the following: Two people to help with walking and/or transfers;A lot of help with bathing/dressing/bathroom;Assistance with cooking/housework;Assist for transportation;Help with stairs or ramp for entrance   Can travel by private vehicle   No    Equipment Recommendations BSC/3in1  Recommendations for Other Services  OT consult    Functional Status Assessment Patient has had a recent decline in their functional status and demonstrates the ability to make significant improvements in function in a reasonable and predictable amount of time.     Precautions / Restrictions Precautions Precautions: Fall Recall of Precautions/Restrictions: Intact Restrictions Weight Bearing Restrictions Per Provider Order: Yes LLE Weight Bearing Per Provider  Order: Non weight bearing      Mobility  Bed Mobility Overal bed mobility: Needs Assistance Bed Mobility: Supine to Sit, Sit to Supine     Supine to sit: Max assist, +2 for physical assistance Sit to supine: Max assist, +2 for physical assistance   General bed mobility comments: Requiring maxA +2 namely for LE management on and off EOB, with pt assisting some with trunk elevation. Requiring assist to scoot anteriorly, though pt does initiate movement well.    Transfers Overall transfer level: Needs assistance Equipment used: Right platform walker Transfers: Sit to/from Stand Sit to Stand: Max assist, +2 physical assistance           General transfer comment: Requiring maxA +2 for power up into standing, and VC for correct hand placement on RW and platform.' Pt with difficulty maintaining NWB status of L LE, despite tactile and verbal cueing to do so. Deferred pivot transfer to chair for this reason.    Ambulation/Gait                  Stairs            Wheelchair Mobility     Tilt Bed    Modified Rankin (Stroke Patients Only)       Balance Overall balance assessment: Needs assistance Sitting-balance support: No upper extremity supported, Feet supported Sitting balance-Leahy Scale: Good     Standing balance support: Reliant on assistive device for balance, During functional activity, Bilateral upper extremity supported Standing balance-Leahy Scale: Poor Standing balance comment: Reliant on external support  Pertinent Vitals/Pain Pain Assessment Pain Assessment: 0-10 Pain Score: 9  Pain Location: L knee, R hand Pain Descriptors / Indicators: Aching, Grimacing, Guarding Pain Intervention(s): Limited activity within patient's tolerance, Monitored during session    Home Living Family/patient expects to be discharged to:: Private residence Living Arrangements: Alone Available Help at Discharge:  Friend(s);Available PRN/intermittently Type of Home: Mobile home Home Access: Stairs to enter Entrance Stairs-Rails: Can reach both Entrance Stairs-Number of Steps: 3-4 steps   Home Layout: One level Home Equipment: Cane - single Librarian, Academic (2 wheels)      Prior Function Prior Level of Function : Independent/Modified Independent;Driving;History of Falls (last six months)             Mobility Comments: Typically uses RW both in the house. Has not fallen other than the fall that brought him to the hospital. ADLs Comments: Independent     Extremity/Trunk Assessment   Upper Extremity Assessment Upper Extremity Assessment: Defer to OT evaluation    Lower Extremity Assessment Lower Extremity Assessment: Generalized weakness;LLE deficits/detail LLE Deficits / Details: Inc pain noted in L knee with any movement of L LE.    Cervical / Trunk Assessment Cervical / Trunk Assessment: Kyphotic  Communication   Communication Communication: Impaired Factors Affecting Communication: Hearing impaired    Cognition Arousal: Alert Behavior During Therapy: WFL for tasks assessed/performed   PT - Cognitive impairments: No apparent impairments                       PT - Cognition Comments: Pt fairly chatty and at times hard to redirect. Following commands: Intact       Cueing Cueing Techniques: Verbal cues     General Comments General comments (skin integrity, edema, etc.): Pt appears motivated to improve mobility, and agreeable to going to rehab following hospital stay    Exercises     Assessment/Plan    PT Assessment Patient needs continued PT services  PT Problem List Decreased strength;Decreased range of motion;Decreased activity tolerance;Decreased balance;Decreased mobility;Decreased knowledge of use of DME;Decreased safety awareness;Decreased knowledge of precautions;Pain       PT Treatment Interventions DME instruction;Gait training;Stair  training;Functional mobility training;Therapeutic activities;Therapeutic exercise;Balance training;Neuromuscular re-education;Patient/family education;Wheelchair mobility training;Manual techniques;Modalities    PT Goals (Current goals can be found in the Care Plan section)  Acute Rehab PT Goals Patient Stated Goal: to get stronger PT Goal Formulation: With patient Time For Goal Achievement: 02/13/24 Potential to Achieve Goals: Good    Frequency Min 3X/week     Co-evaluation               AM-PAC PT 6 Clicks Mobility  Outcome Measure Help needed turning from your back to your side while in a flat bed without using bedrails?: Total Help needed moving from lying on your back to sitting on the side of a flat bed without using bedrails?: Total Help needed moving to and from a bed to a chair (including a wheelchair)?: Total Help needed standing up from a chair using your arms (e.g., wheelchair or bedside chair)?: Total Help needed to walk in hospital room?: Total Help needed climbing 3-5 steps with a railing? : Total 6 Click Score: 6    End of Session Equipment Utilized During Treatment: Gait belt Activity Tolerance: Patient tolerated treatment well Patient left: in bed;with call bell/phone within reach;with bed alarm set Nurse Communication: Mobility status PT Visit Diagnosis: Unsteadiness on feet (R26.81);Other abnormalities of gait and mobility (R26.89);Difficulty in walking, not elsewhere classified (  R26.2);Muscle weakness (generalized) (M62.81);History of falling (Z91.81);Pain Pain - Right/Left: Left Pain - part of body: Leg    Time: 8875-8845 PT Time Calculation (min) (ACUTE ONLY): 30 min   Charges:   PT Evaluation $PT Eval Moderate Complexity: 1 Mod PT Treatments $Therapeutic Activity: 8-22 mins PT General Charges $$ ACUTE PT VISIT: 1 Visit         Kayden Amend, SPT   Harmonii Karle 01/30/2024, 2:35 PM

## 2024-01-30 NOTE — [place] FL2 (Signed)
 [place]  MEDICAID FL2 LEVEL OF CARE FORM     IDENTIFICATION  Patient Name: Douglas [name] Birthdate: 1955-10-08 Sex: male Admission Date (Current Location): 01/28/2024  [facility] and [place] Number:  Producer, Television/film/video and Address:  [facility], [address] [place], [place] [address]      Provider Number: [id number]  Attending Physician Name and Address:  Soledad Ligas, DO  Relative Name and Phone Number:  [name] Muzzy; Neighbor; [phone]    Current Level of Care: Hospital Recommended Level of Care: Skilled Nursing Facility Prior Approval Number:    Date Approved/Denied:   PASRR Number: [id number]  Discharge Plan: SNF    Current Diagnoses: Patient Active Problem List   Diagnosis Date Noted   Open dislocation of proximal interphalangeal (PIP) joint of finger of right hand 01/29/2024   Left knee injury 01/29/2024   PVC's (premature ventricular contractions) 01/20/2024   Venous stasis ulcers of both lower extremities (HCC) 01/14/2024   Epistaxis 01/14/2024   Acute hypoxemic respiratory failure (HCC) 12/12/2023   Ventricular tachycardia (HCC) 12/12/2023   Frequent falls 11/14/2023   Acquired thrombophilia 05/28/2023   Prolonged QT interval 04/23/2023   Chronic systolic CHF (congestive heart failure) (HCC) 04/23/2023   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 06/09/2020   Moderate persistent asthma without complication 06/09/2020   Stage 3a chronic kidney disease (HCC) 06/09/2019   Biventricular failure (HCC) 06/10/2017   Hyperosmolarity syndrome 06/10/2017   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   Coronary artery disease 06/02/2017   Diverticulosis 06/02/2017   Vertebral compression fracture (HCC) 06/02/2017   Spondylosis of cervical spine 06/02/2017   Coagulopathy 06/02/2017   Allergic rhinitis 12/27/2014   Chronic low back pain 12/27/2014   Gastroesophageal reflux disease without esophagitis  12/27/2014   Generalized anxiety disorder 12/27/2014   Hyperlipidemia 12/27/2014   DJD (degenerative joint disease), lumbosacral 08/26/2013   Atrial fibrillation, chronic (HCC) 08/25/2013   History of Substance abuse 08/25/2013   Essential hypertension 05/01/2010   Cardiomyopathy, ischemic 05/01/2010   ICD (implantable cardioverter-defibrillator) in place 05/01/2010    Orientation RESPIRATION BLADDER Height & Weight     Self, Time, Place, Situation  Normal (Room Air) Continent Weight: 240 lb 15.4 oz (109.3 kg) Height:  5' 9 (175.3 cm)  BEHAVIORAL SYMPTOMS/MOOD NEUROLOGICAL BOWEL NUTRITION STATUS    Convulsions/Seizures (History of seizures) Continent Diet (Please see discharge summary)  AMBULATORY STATUS COMMUNICATION OF NEEDS Skin   Extensive Assist Verbally Surgical wounds (Closed Surgical Incision Arm Right)                       Personal Care Assistance Level of Assistance  Bathing, Feeding, Dressing Bathing Assistance: Maximum assistance Feeding assistance: Maximum assistance Dressing Assistance: Maximum assistance     Functional Limitations Info             SPECIAL CARE FACTORS FREQUENCY  PT (By licensed PT), OT (By licensed OT)     PT Frequency: 5x OT Frequency: 5x            Contractures Contractures Info: Not present    Additional Factors Info  Code Status, Allergies Code Status Info: Full Code Allergies Info: Xarelto  (Rivaroxaban ), Codeine, Lasix  (Furosemide ), Vioxx (Rofecoxib)           Current Medications (01/30/2024):  This is the current hospital active medication list Current Facility-Administered Medications  Medication Dose Route Frequency Provider Last Rate Last Admin   acetaminophen  (TYLENOL )  tablet 650 mg  650 mg Oral Q6H PRN Opyd, Timothy S, MD       albuterol  (PROVENTIL ) (2.5 MG/3ML) 0.083% nebulizer solution 2.5 mg  2.5 mg Nebulization Q6H PRN Opyd, Timothy S, MD       ALPRAZolam  (XANAX ) tablet 1 mg  1 mg Oral QHS PRN Opyd,  Timothy S, MD   1 mg at 01/29/24 2045   amiodarone  (PACERONE ) tablet 200 mg  200 mg Oral BID Opyd, Timothy S, MD   200 mg at 01/30/24 1053   citalopram  (CELEXA ) tablet 20 mg  20 mg Oral Daily Opyd, Timothy S, MD   20 mg at 01/30/24 1054   digoxin  (LANOXIN ) tablet 0.0625 mg  0.0625 mg Oral Daily Opyd, Timothy S, MD   0.0625 mg at 01/30/24 1055   fluticasone  furoate-vilanterol (BREO ELLIPTA ) 200-25 MCG/ACT 1 puff  1 puff Inhalation Daily Opyd, Timothy S, MD   1 puff at 01/30/24 1052   HYDROmorphone  (DILAUDID ) injection 0.5 mg  0.5 mg Intravenous Q2H PRN Opyd, Timothy S, MD   0.5 mg at 01/30/24 1458   metoprolol  succinate (TOPROL -XL) 24 hr tablet 25 mg  25 mg Oral Daily Opyd, Timothy S, MD   25 mg at 01/30/24 1046   mexiletine (MEXITIL ) capsule 150 mg  150 mg Oral Q12H Opyd, Timothy S, MD   150 mg at 01/30/24 1045   mupirocin  ointment (BACTROBAN ) 2 % 1 Application  1 Application Nasal BID Jillian Buttery, MD   1 Application at 01/30/24 1055   Oral care mouth rinse  15 mL Mouth Rinse PRN Jillian Buttery, MD       oxyCODONE  (Oxy IR/ROXICODONE ) immediate release tablet 5 mg  5 mg Oral Q4H PRN Opyd, Timothy S, MD   5 mg at 01/30/24 1331   pantoprazole  (PROTONIX ) EC tablet 40 mg  40 mg Oral Daily Opyd, Timothy S, MD   40 mg at 01/30/24 1054   potassium chloride  SA (KLOR-CON  M) CR tablet 20 mEq  20 mEq Oral Daily Opyd, Timothy S, MD   20 mEq at 01/30/24 1054   prochlorperazine  (COMPAZINE ) injection 10 mg  10 mg Intravenous Q6H PRN Swayze, Ava, DO       rosuvastatin  (CRESTOR ) tablet 40 mg  40 mg Oral Daily Opyd, Timothy S, MD   40 mg at 01/30/24 1054   senna (SENOKOT) tablet 8.6 mg  1 tablet Oral Daily PRN Opyd, Timothy S, MD       sodium chloride  flush (NS) 0.9 % injection 3 mL  3 mL Intravenous Q12H Opyd, Timothy S, MD   3 mL at 01/30/24 1056   spironolactone  (ALDACTONE ) tablet 25 mg  25 mg Oral Daily Opyd, Timothy S, MD   25 mg at 01/30/24 1054   tamsulosin  (FLOMAX ) capsule 0.4 mg  0.4 mg Oral Daily Opyd,  Timothy S, MD   0.4 mg at 01/30/24 1054   torsemide  (DEMADEX ) tablet 60 mg  60 mg Oral Daily Opyd, Timothy S, MD   60 mg at 01/30/24 1054     Discharge Medications: Please see discharge summary for a list of discharge medications.  Relevant Imaging Results:  Relevant Lab Results:   Additional Information SSN: [id number]  Lauraine FORBES Saa, LCSWA

## 2024-01-31 ENCOUNTER — Ambulatory Visit

## 2024-01-31 ENCOUNTER — Inpatient Hospital Stay (HOSPITAL_COMMUNITY)

## 2024-01-31 DIAGNOSIS — S63289A Dislocation of proximal interphalangeal joint of unspecified finger, initial encounter: Secondary | ICD-10-CM | POA: Diagnosis not present

## 2024-01-31 DIAGNOSIS — S61209A Unspecified open wound of unspecified finger without damage to nail, initial encounter: Secondary | ICD-10-CM | POA: Diagnosis not present

## 2024-01-31 LAB — BASIC METABOLIC PANEL WITH GFR
Anion gap: 10 (ref 5–15)
BUN: 28 mg/dL — ABNORMAL HIGH (ref 8–23)
CO2: 33 mmol/L — ABNORMAL HIGH (ref 22–32)
Calcium: 8.8 mg/dL — ABNORMAL LOW (ref 8.9–10.3)
Chloride: 93 mmol/L — ABNORMAL LOW (ref 98–111)
Creatinine, Ser: 1.56 mg/dL — ABNORMAL HIGH (ref 0.61–1.24)
GFR, Estimated: 48 mL/min — ABNORMAL LOW (ref >60)
Glucose, Bld: 124 mg/dL — ABNORMAL HIGH (ref 70–99)
Potassium: 3.9 mmol/L (ref 3.5–5.1)
Sodium: 136 mmol/L (ref 135–145)

## 2024-01-31 LAB — CBC WITH DIFFERENTIAL/PLATELET
Abs Immature Granulocytes: 0.09 K/uL — ABNORMAL HIGH (ref 0.00–0.07)
Basophils Absolute: 0 K/uL (ref 0.0–0.1)
Basophils Relative: 0 %
Eosinophils Absolute: 0.1 K/uL (ref 0.0–0.5)
Eosinophils Relative: 1 %
HCT: 51 % (ref 39.0–52.0)
Hemoglobin: 15.9 g/dL (ref 13.0–17.0)
Immature Granulocytes: 1 %
Lymphocytes Relative: 9 %
Lymphs Abs: 1.2 K/uL (ref 0.7–4.0)
MCH: 27 pg (ref 26.0–34.0)
MCHC: 31.2 g/dL (ref 30.0–36.0)
MCV: 86.7 fL (ref 80.0–100.0)
Monocytes Absolute: 2.3 K/uL — ABNORMAL HIGH (ref 0.1–1.0)
Monocytes Relative: 18 %
Neutro Abs: 9.5 K/uL — ABNORMAL HIGH (ref 1.7–7.7)
Neutrophils Relative %: 71 %
Platelets: 207 K/uL (ref 150–400)
RBC: 5.88 MIL/uL — ABNORMAL HIGH (ref 4.22–5.81)
RDW: 21.8 % — ABNORMAL HIGH (ref 11.5–15.5)
Smear Review: NORMAL
WBC: 13.2 K/uL — ABNORMAL HIGH (ref 4.0–10.5)
nRBC: 0 % (ref 0.0–0.2)

## 2024-01-31 NOTE — TOC Progression Note (Addendum)
 Transition of Care [facility]) - Progression Note    Patient Details  Name: Douglas [name] MRN: [id number] Date of Birth: [birth year]  Transition of Care [facility]) CM/SW Contact  Lauraine FORBES Saa, LCSWA Phone Number: 01/31/2024, 9:45 AM  Clinical Narrative:     9:46 AM CSW introduced [name] and role to patient. CSW provided additional SNF bed offer with Medicare rating [facility]). CSW assisted patient in reviewing and narrowing SNF options due to patient's cataract. Patient accepted bed offer at [facility] SNF due to location and Medicare ratings. CSW to submit SNF insurance authorization when patient is medically ready for discharge. SNF, [facility], and MD made aware. CSW will continue to follow.  1:23 PM CSW provided SDOH (food, housing) resources. MD informed medical team that patient is medically stable for discharge. CSW informed [facility] SNF of potential weekend discharge. SNF stated that they do not admit over the weekend and could admit Monday. CSW made medical team aware. CSW submitted SNF insurance authorization for [facility] SNF for Monday discharge which is currently pending ([id number]). CSW will continue to follow.  4:23 PM Patient's SNF insurance authorization remains pending.  Expected Discharge Plan: Skilled Nursing Facility Barriers to Discharge: Continued Medical Work up, English As A Second Language Teacher, SNF Pending bed offer               Expected Discharge Plan and Services In-house Referral: Clinical Social Work   Post Acute Care Choice: Skilled Nursing Facility Living arrangements for the past 2 months: Single Family Home                                       Social Drivers of Health (SDOH) Interventions SDOH Screenings   Food Insecurity: Food Insecurity Present (01/29/2024)  Housing: Low Risk  (01/29/2024)  Transportation Needs: No Transportation Needs (01/29/2024)  Utilities: At Risk (01/29/2024)  Depression (PHQ2-9): High Risk  (01/16/2024)  Financial Resource Strain: High Risk (01/16/2024)  Social Connections: Moderately Isolated (01/29/2024)  Tobacco Use: Low Risk  (01/29/2024)  Health Literacy: Inadequate Health Literacy (01/16/2024)    Readmission Risk Interventions    05/31/2023   11:35 AM 05/30/2023   10:20 AM  Readmission Risk Prevention Plan  Transportation Screening Complete Complete  Home Care Screening Complete Complete  Medication Review (RN CM) Complete Complete

## 2024-01-31 NOTE — Progress Notes (Signed)
 Progress Note   Patient: Douglas [name] FMW:[id number] DOB: [birth year] DOA: 01/28/2024     2 DOS: the patient was seen and examined on 01/31/2024   Brief hospital course: Patient is a 68 year old male with past medical history significant for coronary artery disease status post CABG, A-fib on Eliquis , history of VT, HFrEF with ICD and CKD stage III.  Patient presented with injury to the right second finger.  Patient underwent right index finger reduction and stabilization of PEP index finger dislocation by hand surgery on 01/29/2024.    The patient also has an injury to his left knee with pain and swelling CT of the knee was obtained which demonstrated moderate volume lipohemarthrosis with a non-displaced fracture of the posterolateral tibial plateau with approximately 3 mm of articular surface depression posteriorly. There is mild tricompartmental osteoarthritis. Ligaments were suboptimally evaluated by CT. Muscles are within normal limits. Quadriceps and patellar tendons are intact. A Baker's cyst is noted. The knee has been evaluted by orthopedic surgery. They plan for non-operative management with NWG LLE. He will follow up with Dr. Sherida for this in 2 weeks.   Patient is awaiting discharge to SNF.  Assessment and Plan: 1. Open dislocation of 2nd PIP on right hand  - See above documentation.   - S/P right index finger reduction and stabilization of PEP index finger dislocation by hand surgery on 01/29/2024.  - Continue pain-control, supportive care   - As per prior documentation: Per Dr. Roselynn; the  patient will remain NWB on the right upper extremity in a clamshell splint. The dressing is to stay clear and dry until follow-up - The patient will follow up with Dr. Janus in the office in 1 week for post-operative follow up.   2. Left knee injury  - Moderate to large suprapatellar effusion with non-displaced fracture of the posterolateral tibial plaeau with 3 mm of articular surface  depression posteriorly. - Plan is for non-operative management with NWB LLE - FU with Dr. Sherida in 2 weeks.   3. Chronic HFrEF  - Appears compensated, has ICD  - Continue torsemide , Aldactone , beta-blocker   - stable   4. CAD  - No anginal symptoms  - Bypass grafts were patent on cath in October 2025  - Continue metoprolol  and Crestor , hold Eliquis  for now     5. Atrial fibrillation, hx of VT  - Hold Eliquis  in anticipation of surgery, continue Toprol , amiodarone , mexiletine, and digoxin    - Will restart eliquis  at discharge.   6. CKD 3A  - Appears close to baseline  - Creatinine 1.63 on admission, now 1.56. - Renally-dose medications     7. Asthma  - Stable - Continue ICS-LABA and as-needed albuterol      8. Depression, anxiety - Continue Celexa    9. Agitation/aggression: - None elicited today. -Psychiatry input is highly appreciated.    DVT prophylaxis: SCDs  Code Status: Full  Level of Care: Level of care: Med-Surg Family Communication:   Disposition Plan:    Consults called: Hand surgery, Orthopedic surgery Admission status: Inpatient.   Subjective: No new complaints.  Physical Exam: Vitals:   01/31/24 0317 01/31/24 0323 01/31/24 0923 01/31/24 0949  BP: (!) 150/89  120/79   Pulse: 88  93   Resp: 18  20   Temp: 98.2 F (36.8 C)  98.2 F (36.8 C)   TempSrc: Oral  Oral   SpO2: 96%  96% 96%  Weight:  109.9 kg    Height:  Exam:  Constitutional:  The patient is obese, not in any distress.  Patient is awake and alert.   Eyes:  No pallor. Neck:  neck is supple.  JVD difficult to assess.   Respiratory:  Clear to auscultation.   Cardiovascular:  S1-S2.SABRA Abdomen:  Abdomen is obese, soft and nontender.   Musculoskeletal:  Left knee is swollen and painful to touch Right hand is bandaged. Neurologic:  Patient is awake.  Patient moves all extremities.   Psychiatric:  Mental status Mood, affect appropriate Oriented to person, place, time     Data Reviewed:  Family Communication:   Disposition: Status is: Inpatient Remains inpatient appropriate because:   Planned Discharge Destination: Skilled nursing facility    Time spent: 35 minutes  Author: Leatrice LILLETTE Chapel, MD 01/31/2024 1:24 PM  For on call review [email or web address].

## 2024-01-31 NOTE — Consult Note (Signed)
 [facility] Psychiatric Consult Initial  Patient Name: .Douglas [name]  MRN: [id number]  DOB: February 19, 1956  Consult Order details:  Orders (From admission, onward)     Start     Ordered   01/30/24 1501  IP CONSULT TO PSYCHIATRY       Ordering Provider: Soledad Ligas, DO  Provider:  (Not yet assigned)  Question Answer Comment  Location [facility]   Reason for Consult? Aggressive, inappropriate behavior to staff.      01/30/24 1521             Mode of Visit: In person    Psychiatry Consult Evaluation  Service Date: January 31, 2024 LOS:  LOS: 2 days  Chief Complaint Aggressive Behavior  Primary Psychiatric Diagnoses  Adjustment Disorder  Assessment  Douglas [name] is a 68 y.o. male admitted: Medicallyfor 01/28/2024  5:19 PM for Open Dislocation of PIP.   On initial examination, patient was seen laying in bed on my approach with nursing staff at bedside. He reports that prior to coming to the hospital he had a fall that resulted in him breaking hi finger and dislocating his knee cap. He reports that he is still in pain but he is feeling better than he was when he first came to the hospital. He is currently living in a trailer by himself and he reports that he has a neighbor that is his power of attorney. The patient denies any paranoid concerns about his treatment in the hospital and commented that the nurses have treated him well. He was confused about the reports that he had been displaying aggressive behavior.  Per nurse at bedside this is her first day working with the patient but he has been pleasant and cooperative. She was also confused when she saw the consult for agitation. She did not get anything in check out from last night that would suggest that the patient had any issues.  The patient denies any SI/HI/AVH or paranoid thoughts.    Diagnoses:  Active Hospital problems: Principal Problem:   Open dislocation of proximal interphalangeal (PIP) joint  of finger of right hand Active Problems:   Essential hypertension   Atrial fibrillation, chronic (HCC)   Generalized anxiety disorder   Coronary artery disease   Chronic systolic CHF (congestive heart failure) (HCC)   Mild recurrent major depression   Moderate persistent asthma without complication   Stage 3a chronic kidney disease (HCC)   Left knee injury    Plan   ## Psychiatric Medication Recommendations:  -No recommendations  ## Medical Decision Making Capacity: Not specifically addressed in this encounter  ## Further Work-up:  -- Per primary team -- Pertinent labwork reviewed earlier this admission includes:  Recent Results (from the past 2160 hours)  Lipid panel     Status: Abnormal   Collection Time: 11/21/23  9:33 AM  Result Value Ref Range   Cholesterol, Total 55 (L) 100 - 199 mg/dL   Triglycerides 87 0 - 149 mg/dL   HDL 20 (L) >60 mg/dL   VLDL Cholesterol Cal 18 5 - 40 mg/dL   LDL Chol Calc (NIH) 17 0 - 99 mg/dL   Chol/HDL Ratio 2.8 0.0 - 5.0 ratio    Comment:                                   T. Chol/HDL Ratio  Men  Women                               1/2 Avg.Risk  3.4    3.3                                   Avg.Risk  5.0    4.4                                2X Avg.Risk  9.6    7.1                                3X Avg.Risk 23.4   11.0   CMP14+EGFR     Status: Abnormal   Collection Time: 11/21/23  9:33 AM  Result Value Ref Range   Glucose 126 (H) 70 - 99 mg/dL   BUN 17 8 - 27 mg/dL   Creatinine, Ser 8.08 (H) 0.76 - 1.27 mg/dL   eGFR 38 (L) >40 fO/fpw/8.26   BUN/Creatinine Ratio 9 (L) 10 - 24   Sodium 138 134 - 144 mmol/L   Potassium 4.0 3.5 - 5.2 mmol/L   Chloride 97 96 - 106 mmol/L   CO2 23 20 - 29 mmol/L   Calcium  9.5 8.6 - 10.2 mg/dL   Total Protein 7.8 6.0 - 8.5 g/dL   Albumin 4.0 3.9 - 4.9 g/dL   Globulin, Total 3.8 1.5 - 4.5 g/dL   Bilirubin Total 1.4 (H) 0.0 - 1.2 mg/dL   Alkaline Phosphatase 126  (H) 47 - 123 IU/L    Comment:               **Please note reference interval change**   AST 24 0 - 40 IU/L   ALT 13 0 - 44 IU/L  CBC with Differential/Platelet     Status: Abnormal   Collection Time: 11/21/23  9:33 AM  Result Value Ref Range   WBC 6.7 3.4 - 10.8 x10E3/uL   RBC 5.42 4.14 - 5.80 x10E6/uL   Hemoglobin 13.4 13.0 - 17.7 g/dL   Hematocrit 52.9 62.4 - 51.0 %   MCV 87 79 - 97 fL   MCH 24.7 (L) 26.6 - 33.0 pg   MCHC 28.5 (L) 31.5 - 35.7 g/dL   RDW 82.7 (H) 88.3 - 84.5 %   Platelets 247 150 - 450 x10E3/uL   Neutrophils 60 Not Estab. %   Lymphs 24 Not Estab. %   Monocytes 12 Not Estab. %   Eos 3 Not Estab. %   Basos 1 Not Estab. %   Neutrophils Absolute 4.1 1.4 - 7.0 x10E3/uL   Lymphocytes Absolute 1.6 0.7 - 3.1 x10E3/uL   Monocytes Absolute 0.8 0.1 - 0.9 x10E3/uL   EOS (ABSOLUTE) 0.2 0.0 - 0.4 x10E3/uL   Basophils Absolute 0.1 0.0 - 0.2 x10E3/uL   Immature Granulocytes 0 Not Estab. %   Immature Grans (Abs) 0.0 0.0 - 0.1 x10E3/uL  Resp panel by RT-PCR (RSV, Flu A&B, Covid) Anterior Nasal Swab     Status: None   Collection Time: 12/12/23 12:04 PM   Specimen: Anterior Nasal Swab  Result Value Ref Range   SARS Coronavirus 2 by RT PCR NEGATIVE NEGATIVE    Comment: (NOTE) SARS-CoV-2 target nucleic acids  are NOT DETECTED.  The SARS-CoV-2 RNA is generally detectable in upper respiratory specimens during the acute phase of infection. The lowest concentration of SARS-CoV-2 viral copies this assay can detect is 138 copies/mL. A negative result does not preclude SARS-Cov-2 infection and should not be used as the sole basis for treatment or other patient management decisions. A negative result may occur with  improper specimen collection/handling, submission of specimen other than nasopharyngeal swab, presence of viral mutation(s) within the areas targeted by this assay, and inadequate number of viral copies(<138 copies/mL). A negative result must be combined with clinical  observations, patient history, and epidemiological information. The expected result is Negative.  Fact Sheet for Patients:  bloggercourse.com  Fact Sheet for Healthcare Providers:  seriousbroker.it  This test is no t yet approved or cleared by the [place]  FDA and  has been authorized for detection and/or diagnosis of SARS-CoV-2 by FDA under an Emergency Use Authorization (EUA). This EUA will remain  in effect (meaning this test can be used) for the duration of the COVID-19 declaration under Section 564(b)(1) of the Act, 21 U.S.C.section 360bbb-3(b)(1), unless the authorization is terminated  or revoked sooner.       Influenza A by PCR NEGATIVE NEGATIVE   Influenza B by PCR NEGATIVE NEGATIVE    Comment: (NOTE) The Xpert Xpress SARS-CoV-2/FLU/RSV plus assay is intended as an aid in the diagnosis of influenza from Nasopharyngeal swab specimens and should not be used as a sole basis for treatment. Nasal washings and aspirates are unacceptable for Xpert Xpress SARS-CoV-2/FLU/RSV testing.  Fact Sheet for Patients: bloggercourse.com  Fact Sheet for Healthcare Providers: seriousbroker.it  This test is not yet approved or cleared by the [place]  FDA and has been authorized for detection and/or diagnosis of SARS-CoV-2 by FDA under an Emergency Use Authorization (EUA). This EUA will remain in effect (meaning this test can be used) for the duration of the COVID-19 declaration under Section 564(b)(1) of the Act, 21 U.S.C. section 360bbb-3(b)(1), unless the authorization is terminated or revoked.     Resp Syncytial Virus by PCR NEGATIVE NEGATIVE    Comment: (NOTE) Fact Sheet for Patients: bloggercourse.com  Fact Sheet for Healthcare Providers: seriousbroker.it  This test is not yet approved or cleared by the [place]   FDA and has been authorized for detection and/or diagnosis of SARS-CoV-2 by FDA under an Emergency Use Authorization (EUA). This EUA will remain in effect (meaning this test can be used) for the duration of the COVID-19 declaration under Section 564(b)(1) of the Act, 21 U.S.C. section 360bbb-3(b)(1), unless the authorization is terminated or revoked.  Performed at [facility], [address], [place], [place] [address]   CBC with Differential     Status: Abnormal   Collection Time: 12/12/23 12:34 PM  Result Value Ref Range   WBC 7.2 4.0 - 10.5 K/uL   RBC 5.97 (H) 4.22 - 5.81 MIL/uL   Hemoglobin 14.8 13.0 - 17.0 g/dL   HCT 51.0 60.9 - 47.9 %   MCV 81.9 80.0 - 100.0 fL   MCH 24.8 (L) 26.0 - 34.0 pg   MCHC 30.3 30.0 - 36.0 g/dL   RDW 77.6 (H) 88.4 - 84.4 %   Platelets 314 150 - 400 K/uL   nRBC 0.0 0.0 - 0.2 %   Neutrophils Relative % 62 %   Neutro Abs 4.6 1.7 - 7.7 K/uL   Lymphocytes Relative 23 %   Lymphs Abs 1.7 0.7 - 4.0 K/uL   Monocytes Relative 13 %  Monocytes Absolute 0.9 0.1 - 1.0 K/uL   Eosinophils Relative 1 %   Eosinophils Absolute 0.0 0.0 - 0.5 K/uL   Basophils Relative 1 %   Basophils Absolute 0.1 0.0 - 0.1 K/uL   Immature Granulocytes 0 %   Abs Immature Granulocytes 0.01 0.00 - 0.07 K/uL    Comment: Performed at [facility], [address], [place], [place] [address]  Comprehensive metabolic panel     Status: Abnormal   Collection Time: 12/12/23 12:34 PM  Result Value Ref Range   Sodium 138 135 - 145 mmol/L   Potassium 3.4 (L) 3.5 - 5.1 mmol/L   Chloride 97 (L) 98 - 111 mmol/L   CO2 27 22 - 32 mmol/L   Glucose, Bld 143 (H) 70 - 99 mg/dL    Comment: Glucose reference range applies only to samples taken after fasting for at least 8 hours.   BUN 15 8 - 23 mg/dL   Creatinine, Ser 8.90 0.61 - 1.24 mg/dL   Calcium  9.8 8.9 - 10.3 mg/dL   Total Protein 8.8 (H) 6.5 - 8.1 g/dL   Albumin 4.3 3.5 - 5.0 g/dL   AST 31 15 - 41 U/L   ALT 13 0 - 44 U/L   Alkaline  Phosphatase 107 38 - 126 U/L   Total Bilirubin 1.5 (H) 0.0 - 1.2 mg/dL   GFR, Estimated >39 >39 mL/min    Comment: (NOTE) Calculated using the CKD-EPI Creatinine Equation (2021)    Anion gap 13 5 - 15    Comment: Performed at [facility], [address], [place], [place] [address]  Lipase, blood     Status: None   Collection Time: 12/12/23 12:34 PM  Result Value Ref Range   Lipase 16 11 - 51 U/L    Comment: Performed at [facility], [address], [place], [place] [address]  Pro Brain natriuretic peptide     Status: Abnormal   Collection Time: 12/12/23 12:34 PM  Result Value Ref Range   Pro Brain Natriuretic Peptide 5,740.0 (H) <300.0 pg/mL    Comment: (NOTE) Age Group        Cut-Points    Interpretation  < 50 years     450 pg/mL       NT-proBNP > 450 pg/mL indicates                                ADHF is likely              50 to 75 years  900 pg/mL      NT-proBNP > 900 pg/mL indicates          ADHF is likely  > 75 years      1800 pg/mL     NT-proBNP > 1800 pg/mL indicates          ADHF is likely                           All ages    Results between       Indeterminate. Further clinical             300 and the cut-   information is needed to determine            point for age group   if ADHF is present.  Elecsys proBNP II/ Elecsys proBNP II STAT           Cut-Point                       Interpretation  300 pg/mL                    NT-proBNP <300pg/mL indicates                             ADHF is not likely  Performed at [facility], [address] [place], [place] [address]   TSH     Status: None   Collection Time: 12/12/23  6:31 PM  Result Value Ref Range   TSH 1.670 0.350 - 4.500 uIU/mL    Comment: Performed at [facility], [address] [place], [place] [address]  Magnesium      Status: None   Collection Time: 12/12/23  6:31 PM  Result Value Ref Range   Magnesium  2.0 1.7 - 2.4 mg/dL    Comment:  Performed at [facility], [address], [place], [place] [address]  Troponin T, High Sensitivity     Status: Abnormal   Collection Time: 12/12/23  6:31 PM  Result Value Ref Range   Troponin T High Sensitivity 36 (H) 0 - 19 ng/L    Comment: (NOTE) Biotin concentrations > 1000 ng/mL falsely decrease TnT results.  Serial cardiac troponin measurements are suggested.  Refer to the Links section for chest pain algorithms and additional  guidance. Performed at [facility], [address], [place], [place] [address]   Glucose, capillary     Status: Abnormal   Collection Time: 12/12/23  9:46 PM  Result Value Ref Range   Glucose-Capillary 142 (H) 70 - 99 mg/dL    Comment: Glucose reference range applies only to samples taken after fasting for at least 8 hours.  MRSA Next Gen by PCR, Nasal     Status: None   Collection Time: 12/12/23  9:50 PM   Specimen: Nasal Mucosa; Nasal Swab  Result Value Ref Range   MRSA by PCR Next Gen NOT DETECTED NOT DETECTED    Comment: (NOTE) The GeneXpert MRSA Assay (FDA approved for NASAL specimens only), is one component of a comprehensive MRSA colonization surveillance program. It is not intended to diagnose MRSA infection nor to guide or monitor treatment for MRSA infections. Test performance is not FDA approved in patients less than 78 years old. Performed at [facility], [address], [place], [place] [address]   Basic metabolic panel     Status: Abnormal   Collection Time: 12/13/23  6:43 AM  Result Value Ref Range   Sodium 134 (L) 135 - 145 mmol/L   Potassium 5.9 (H) 3.5 - 5.1 mmol/L   Chloride 100 98 - 111 mmol/L   CO2 20 (L) 22 - 32 mmol/L   Glucose, Bld 100 (H) 70 - 99 mg/dL    Comment: Glucose reference range applies only to samples taken after fasting for at least 8 hours.   BUN 21 8 - 23 mg/dL   Creatinine, Ser 8.13 (H) 0.61 - 1.24 mg/dL   Calcium  9.5 8.9 - 10.3 mg/dL   GFR, Estimated 39 (L) >60 mL/min    Comment:  (NOTE) Calculated using the CKD-EPI Creatinine Equation (2021)    Anion gap 14 5 - 15    Comment: Performed at [facility], [address] [place], [place] [address]  CBC     Status: Abnormal   Collection Time: 12/13/23  6:43 AM  Result Value Ref Range   WBC 8.6 4.0 - 10.5 K/uL   RBC 6.27 (H) 4.22 - 5.81 MIL/uL   Hemoglobin 15.4 13.0 - 17.0 g/dL   HCT 48.4 60.9 - 47.9 %   MCV 82.1 80.0 - 100.0 fL   MCH 24.6 (L) 26.0 - 34.0 pg   MCHC 29.9 (L) 30.0 - 36.0 g/dL   RDW 77.8 (H) 88.4 - 84.4 %   Platelets 341 150 - 400 K/uL   nRBC 0.0 0.0 - 0.2 %    Comment: Performed at [facility], [address] [place], [place] [address]  Troponin I (High Sensitivity)     Status: Abnormal   Collection Time: 12/13/23  6:43 AM  Result Value Ref Range   Troponin I (High Sensitivity) 53 (H) <18 ng/L    Comment: (NOTE) Elevated high sensitivity troponin I (hsTnI) values and significant  changes across serial measurements may suggest ACS but many other  chronic and acute conditions are known to elevate hsTnI results.  Refer to the Links section for chest pain algorithms and additional  guidance. Performed at [facility], [address] [place], [place] [address]   ECHOCARDIOGRAM COMPLETE     Status: None   Collection Time: 12/13/23  9:46 AM  Result Value Ref Range   Weight 3,911.84 oz   Height 69 in   BP 117/90 mmHg   S' Lateral 5.70 cm   MV M vel 4.32 m/s   MV Peak grad 74.6 mmHg   Est EF 30 - 35%   Glucose, capillary     Status: Abnormal   Collection Time: 12/13/23 11:40 AM  Result Value Ref Range   Glucose-Capillary 128 (H) 70 - 99 mg/dL    Comment: Glucose reference range applies only to samples taken after fasting for at least 8 hours.  Lactic acid, plasma     Status: None   Collection Time: 12/13/23  3:03 PM  Result Value Ref Range   Lactic Acid, Venous 1.4 0.5 - 1.9 mmol/L    Comment: Performed at [facility], [address], [place], [place] [address]   Basic metabolic panel with GFR     Status: Abnormal   Collection Time: 12/13/23  3:03 PM  Result Value Ref Range   Sodium 137 135 - 145 mmol/L   Potassium 5.1 3.5 - 5.1 mmol/L   Chloride 101 98 - 111 mmol/L   CO2 24 22 - 32 mmol/L   Glucose, Bld 115 (H) 70 - 99 mg/dL    Comment: Glucose reference range applies only to samples taken after fasting for at least 8 hours.   BUN 27 (H) 8 - 23 mg/dL   Creatinine, Ser 7.97 (H) 0.61 - 1.24 mg/dL   Calcium  9.2 8.9 - 10.3 mg/dL   GFR, Estimated 35 (L) >60 mL/min    Comment: (NOTE) Calculated using the CKD-EPI Creatinine Equation (2021)    Anion gap 12 5 - 15    Comment: Performed at [facility], [address], [place], [place] [address]  Magnesium      Status: None   Collection Time: 12/13/23  3:03 PM  Result Value Ref Range   Magnesium  2.0 1.7 - 2.4 mg/dL    Comment: Performed at [facility], [address], [place], [place] [address]  Cooxemetry Panel (carboxy,  met, total hgb, O2 sat)     Status: Abnormal   Collection Time: 12/13/23  3:59 PM  Result Value Ref Range   Total hemoglobin 15.0 12.0 - 16.0 g/dL   O2 Saturation 27.2 %   Carboxyhemoglobin 2.4 (H) 0.5 - 1.5 %   Methemoglobin 1.9 (H) 0.0 - 1.5 %    Comment: Performed at [facility], [address], [place], [place] [address]  Urinalysis, Routine w reflex microscopic -Urine, Clean Catch     Status: Abnormal   Collection Time: 12/13/23  5:56 PM  Result Value Ref Range   Color, Urine YELLOW YELLOW   APPearance HAZY (A) CLEAR   Specific Gravity, Urine 1.021 1.005 - 1.030   pH 5.0 5.0 - 8.0   Glucose, UA 50 (A) NEGATIVE mg/dL   Hgb urine dipstick NEGATIVE NEGATIVE   Bilirubin Urine NEGATIVE NEGATIVE   Ketones, ur NEGATIVE NEGATIVE mg/dL   Protein, ur 899 (A) NEGATIVE mg/dL   Nitrite NEGATIVE NEGATIVE   Leukocytes,Ua NEGATIVE NEGATIVE   RBC / HPF 0-5 0 - 5 RBC/hpf   WBC, UA 0-5 0 - 5 WBC/hpf   Bacteria, UA RARE (A) NONE SEEN   Squamous Epithelial / HPF  0-5 0 - 5 /HPF   Mucus PRESENT    Hyaline Casts, UA PRESENT    Ca Oxalate Crys, UA PRESENT     Comment: Performed at [facility], [address] [place], [place] [address]  Lactic acid, plasma     Status: None   Collection Time: 12/13/23  5:56 PM  Result Value Ref Range   Lactic Acid, Venous 1.5 0.5 - 1.9 mmol/L    Comment: Performed at [facility], [address] [place], [place] [address]  Basic metabolic panel with GFR     Status: Abnormal   Collection Time: 12/14/23  4:08 AM  Result Value Ref Range   Sodium 135 135 - 145 mmol/L   Potassium 4.8 3.5 - 5.1 mmol/L   Chloride 98 98 - 111 mmol/L   CO2 25 22 - 32 mmol/L   Glucose, Bld 110 (H) 70 - 99 mg/dL    Comment: Glucose reference range applies only to samples taken after fasting for at least 8 hours.   BUN 32 (H) 8 - 23 mg/dL   Creatinine, Ser 8.24 (H) 0.61 - 1.24 mg/dL   Calcium  9.3 8.9 - 10.3 mg/dL   GFR, Estimated 42 (L) >60 mL/min    Comment: (NOTE) Calculated using the CKD-EPI Creatinine Equation (2021)    Anion gap 12 5 - 15    Comment: Performed at [facility], [address] [place], [place] [address]  Magnesium      Status: None   Collection Time: 12/14/23  4:08 AM  Result Value Ref Range   Magnesium  1.8 1.7 - 2.4 mg/dL    Comment: Performed at [facility], [address], [place], [place] [address]  Cooxemetry Panel (carboxy, met, total hgb, O2 sat)     Status: Abnormal   Collection Time: 12/14/23  9:41 AM  Result Value Ref Range   Total hemoglobin 14.2 12.0 - 16.0 g/dL   O2 Saturation 39.1 %   Carboxyhemoglobin 2.7 (H) 0.5 - 1.5 %   Methemoglobin <0.7 0.0 - 1.5 %    Comment: Performed at [facility], [address] [place], [place] [address]  Basic metabolic panel with GFR     Status: Abnormal   Collection Time:  12/15/23  5:10 AM  Result Value Ref Range   Sodium 134 (L) 135 - 145 mmol/L   Potassium 4.2 3.5 - 5.1 mmol/L   Chloride 95 (L) 98 - 111 mmol/L   CO2 30 22 - 32  mmol/L   Glucose, Bld 109 (H) 70 - 99 mg/dL    Comment: Glucose reference range applies only to samples taken after fasting for at least 8 hours.   BUN 29 (H) 8 - 23 mg/dL   Creatinine, Ser 8.65 (H) 0.61 - 1.24 mg/dL   Calcium  8.9 8.9 - 10.3 mg/dL   GFR, Estimated 58 (L) >60 mL/min    Comment: (NOTE) Calculated using the CKD-EPI Creatinine Equation (2021)    Anion gap 9 5 - 15    Comment: Performed at [facility], [address] [place], [place] [address]  Magnesium      Status: None   Collection Time: 12/15/23  5:10 AM  Result Value Ref Range   Magnesium  2.0 1.7 - 2.4 mg/dL    Comment: Performed at [facility], [address] [place], [place] [address]  Cooxemetry Panel (carboxy, met, total hgb, O2 sat)     Status: None   Collection Time: 12/15/23  5:10 AM  Result Value Ref Range   Total hemoglobin 13.8 12.0 - 16.0 g/dL   O2 Saturation 31.0 %   Carboxyhemoglobin 1.5 0.5 - 1.5 %   Methemoglobin <0.7 0.0 - 1.5 %    Comment: Performed at [facility], [address], [place], [place] [address]  CBC     Status: Abnormal   Collection Time: 12/15/23  5:10 AM  Result Value Ref Range   WBC 7.6 4.0 - 10.5 K/uL   RBC 5.33 4.22 - 5.81 MIL/uL   Hemoglobin 12.9 (L) 13.0 - 17.0 g/dL   HCT 55.7 60.9 - 47.9 %   MCV 82.9 80.0 - 100.0 fL   MCH 24.2 (L) 26.0 - 34.0 pg   MCHC 29.2 (L) 30.0 - 36.0 g/dL   RDW 78.4 (H) 88.4 - 84.4 %   Platelets 252 150 - 400 K/uL   nRBC 0.0 0.0 - 0.2 %    Comment: Performed at [facility], [address], [place], [place] [address]  Cooxemetry Panel (carboxy, met, total hgb, O2 sat)     Status: None   Collection Time: 12/15/23 10:50 AM  Result Value Ref Range   Total hemoglobin 13.1 12.0 - 16.0 g/dL   O2 Saturation 37.7 %   Carboxyhemoglobin 1.3 0.5 - 1.5 %   Methemoglobin 0.8 0.0 - 1.5 %    Comment: Performed at [facility], [address], [place], [place] [address]  Basic metabolic panel with GFR     Status: Abnormal    Collection Time: 12/16/23  2:15 AM  Result Value Ref Range   Sodium 133 (L) 135 - 145 mmol/L   Potassium 4.8 3.5 - 5.1 mmol/L   Chloride 93 (L) 98 - 111 mmol/L   CO2 30 22 - 32 mmol/L   Glucose, Bld 123 (H) 70 - 99 mg/dL    Comment: Glucose reference range applies only to samples taken after fasting for at least 8 hours.   BUN 32 (H) 8 - 23 mg/dL   Creatinine, Ser 8.59 (H) 0.61 - 1.24 mg/dL   Calcium  8.7 (L) 8.9 - 10.3 mg/dL   GFR, Estimated 55 (L) >60 mL/min    Comment: (NOTE) Calculated using the CKD-EPI Creatinine  Equation (2021)    Anion gap 10 5 - 15    Comment: Performed at [facility], [address], [place], [place] [address]  Magnesium      Status: None   Collection Time: 12/16/23  2:15 AM  Result Value Ref Range   Magnesium  1.7 1.7 - 2.4 mg/dL    Comment: Performed at [facility], [address] [place], [place] [address]  Cooxemetry Panel (carboxy, met, total hgb, O2 sat)     Status: Abnormal   Collection Time: 12/16/23  2:15 AM  Result Value Ref Range   Total hemoglobin 13.1 12.0 - 16.0 g/dL   O2 Saturation 32.3 %   Carboxyhemoglobin 2.3 (H) 0.5 - 1.5 %   Methemoglobin <0.7 0.0 - 1.5 %    Comment: Performed at [facility], [address], [place], [place] [address]  CBC     Status: Abnormal   Collection Time: 12/16/23  2:15 AM  Result Value Ref Range   WBC 8.2 4.0 - 10.5 K/uL   RBC 5.13 4.22 - 5.81 MIL/uL   Hemoglobin 12.5 (L) 13.0 - 17.0 g/dL   HCT 57.1 60.9 - 47.9 %   MCV 83.4 80.0 - 100.0 fL   MCH 24.4 (L) 26.0 - 34.0 pg   MCHC 29.2 (L) 30.0 - 36.0 g/dL   RDW 78.9 (H) 88.4 - 84.4 %   Platelets 258 150 - 400 K/uL   nRBC 0.0 0.0 - 0.2 %    Comment: Performed at [facility], [address] [place], [place] [address]  Basic metabolic panel with GFR     Status: Abnormal   Collection Time: 12/17/23  2:56 AM  Result Value Ref Range   Sodium 131 (L) 135 - 145 mmol/L   Potassium 4.8 3.5 - 5.1 mmol/L   Chloride 92 (L) 98 - 111 mmol/L    CO2 31 22 - 32 mmol/L   Glucose, Bld 109 (H) 70 - 99 mg/dL    Comment: Glucose reference range applies only to samples taken after fasting for at least 8 hours.   BUN 36 (H) 8 - 23 mg/dL   Creatinine, Ser 8.43 (H) 0.61 - 1.24 mg/dL   Calcium  9.0 8.9 - 10.3 mg/dL   GFR, Estimated 48 (L) >60 mL/min    Comment: (NOTE) Calculated using the CKD-EPI Creatinine Equation (2021)    Anion gap 8 5 - 15    Comment: Performed at [facility], [address] [place], [place] [address]  Magnesium      Status: Abnormal   Collection Time: 12/17/23  2:56 AM  Result Value Ref Range   Magnesium  2.6 (H) 1.7 - 2.4 mg/dL    Comment: Performed at [facility], [address], [place], [place] [address]  Cooxemetry Panel (carboxy, met, total hgb, O2 sat)     Status: None   Collection Time: 12/17/23  2:56 AM  Result Value Ref Range   Total hemoglobin 13.2 12.0 - 16.0 g/dL   O2 Saturation 49.0 %   Carboxyhemoglobin 1.4 0.5 - 1.5 %   Methemoglobin <0.7 0.0 - 1.5 %    Comment: Performed at [facility], [address], [place], [place] [address]  CBC     Status: Abnormal   Collection Time: 12/17/23  2:56 AM  Result Value Ref Range   WBC 9.1 4.0 - 10.5 K/uL   RBC 5.19 4.22 - 5.81 MIL/uL   Hemoglobin 12.7 (L)  13.0 - 17.0 g/dL   HCT 56.8 60.9 - 47.9 %   MCV 83.0 80.0 - 100.0 fL   MCH 24.5 (L) 26.0 - 34.0 pg   MCHC 29.5 (L) 30.0 - 36.0 g/dL   RDW 78.5 (H) 88.4 - 84.4 %   Platelets 282 150 - 400 K/uL   nRBC 0.0 0.0 - 0.2 %    Comment: Performed at [facility], [address], [place], [place] [address]  Cooxemetry Panel (carboxy, met, total hgb, O2 sat)     Status: Abnormal   Collection Time: 12/17/23 10:33 AM  Result Value Ref Range   Total hemoglobin 13.3 12.0 - 16.0 g/dL   O2 Saturation 48.2 %   Carboxyhemoglobin 1.8 (H) 0.5 - 1.5 %   Methemoglobin <0.7 0.0 - 1.5 %    Comment: Performed at [facility], [address] [place], [place] [address]  Basic metabolic  panel with GFR     Status: Abnormal   Collection Time: 12/18/23  2:51 AM  Result Value Ref Range   Sodium 131 (L) 135 - 145 mmol/L   Potassium 5.1 3.5 - 5.1 mmol/L   Chloride 91 (L) 98 - 111 mmol/L   CO2 29 22 - 32 mmol/L   Glucose, Bld 117 (H) 70 - 99 mg/dL    Comment: Glucose reference range applies only to samples taken after fasting for at least 8 hours.   BUN 46 (H) 8 - 23 mg/dL   Creatinine, Ser 8.36 (H) 0.61 - 1.24 mg/dL   Calcium  8.9 8.9 - 10.3 mg/dL   GFR, Estimated 46 (L) >60 mL/min    Comment: (NOTE) Calculated using the CKD-EPI Creatinine Equation (2021)    Anion gap 11 5 - 15    Comment: Performed at [facility], [address] [place], [place] [address]  Magnesium      Status: None   Collection Time: 12/18/23  2:51 AM  Result Value Ref Range   Magnesium  2.4 1.7 - 2.4 mg/dL    Comment: Performed at [facility], [address] [place], [place] [address]  Cooxemetry Panel (carboxy, met, total hgb, O2 sat)     Status: Abnormal   Collection Time: 12/18/23  2:51 AM  Result Value Ref Range   Total hemoglobin 13.5 12.0 - 16.0 g/dL   O2 Saturation 49.6 %   Carboxyhemoglobin 1.9 (H) 0.5 - 1.5 %   Methemoglobin 0.9 0.0 - 1.5 %    Comment: Performed at [facility], [address], [place], [place] [address]  CBC     Status: Abnormal   Collection Time: 12/18/23  2:51 AM  Result Value Ref Range   WBC 6.9 4.0 - 10.5 K/uL   RBC 5.15 4.22 - 5.81 MIL/uL   Hemoglobin 12.7 (L) 13.0 - 17.0 g/dL   HCT 57.5 60.9 - 47.9 %   MCV 82.3 80.0 - 100.0 fL   MCH 24.7 (L) 26.0 - 34.0 pg   MCHC 30.0 30.0 - 36.0 g/dL   RDW 78.3 (H) 88.4 - 84.4 %   Platelets 243 150 - 400 K/uL   nRBC 0.0 0.0 - 0.2 %    Comment: Performed at [facility], [address] [place], [place] [address]  POCT I-Stat EG7     Status: Abnormal   Collection Time: 12/18/23  9:31 AM  Result Value Ref Range   pH, Ven 7.435 (H) 7.25 - 7.43   pCO2, Ven 50.3 44 -  60 mmHg   pO2, Ven 25 (LL) 32 -  45 mmHg   Bicarbonate 33.8 (H) 20.0 - 28.0 mmol/L   TCO2 35 (H) 22 - 32 mmol/L   O2 Saturation 46 %   Acid-Base Excess 8.0 (H) 0.0 - 2.0 mmol/L   Sodium 135 135 - 145 mmol/L   Potassium 4.1 3.5 - 5.1 mmol/L   Calcium , Ion 1.14 (L) 1.15 - 1.40 mmol/L   HCT 44.0 39.0 - 52.0 %   Hemoglobin 15.0 13.0 - 17.0 g/dL   Sample type VENOUS    Comment NOTIFIED PHYSICIAN   POCT I-Stat EG7     Status: Abnormal   Collection Time: 12/18/23  9:31 AM  Result Value Ref Range   pH, Ven 7.423 7.25 - 7.43   pCO2, Ven 51.0 44 - 60 mmHg   pO2, Ven 24 (LL) 32 - 45 mmHg   Bicarbonate 33.3 (H) 20.0 - 28.0 mmol/L   TCO2 35 (H) 22 - 32 mmol/L   O2 Saturation 43 %   Acid-Base Excess 7.0 (H) 0.0 - 2.0 mmol/L   Sodium 135 135 - 145 mmol/L   Potassium 4.2 3.5 - 5.1 mmol/L   Calcium , Ion 1.18 1.15 - 1.40 mmol/L   HCT 45.0 39.0 - 52.0 %   Hemoglobin 15.3 13.0 - 17.0 g/dL   Sample type VENOUS    Comment NOTIFIED PHYSICIAN   I-STAT 7, (LYTES, BLD GAS, ICA, H+H)     Status: Abnormal   Collection Time: 12/18/23  9:39 AM  Result Value Ref Range   pH, Arterial 7.439 7.35 - 7.45   pCO2 arterial 30.3 (L) 32 - 48 mmHg   pO2, Arterial 61 (L) 83 - 108 mmHg   Bicarbonate 20.5 20.0 - 28.0 mmol/L   TCO2 21 (L) 22 - 32 mmol/L   O2 Saturation 92 %   Acid-base deficit 3.0 (H) 0.0 - 2.0 mmol/L   Sodium 148 (H) 135 - 145 mmol/L   Potassium 2.7 (LL) 3.5 - 5.1 mmol/L   Calcium , Ion 0.64 (LL) 1.15 - 1.40 mmol/L   HCT 32.0 (L) 39.0 - 52.0 %   Hemoglobin 10.9 (L) 13.0 - 17.0 g/dL   Sample type ARTERIAL    Comment NOTIFIED PHYSICIAN   Basic metabolic panel with GFR     Status: Abnormal   Collection Time: 12/19/23  2:41 AM  Result Value Ref Range   Sodium 133 (L) 135 - 145 mmol/L   Potassium 4.4 3.5 - 5.1 mmol/L   Chloride 94 (L) 98 - 111 mmol/L   CO2 29 22 - 32 mmol/L   Glucose, Bld 106 (H) 70 - 99 mg/dL    Comment: Glucose reference range applies only to samples taken after fasting for at least 8 hours.   BUN 43 (H) 8 -  23 mg/dL   Creatinine, Ser 8.59 (H) 0.61 - 1.24 mg/dL   Calcium  8.8 (L) 8.9 - 10.3 mg/dL   GFR, Estimated 55 (L) >60 mL/min    Comment: (NOTE) Calculated using the CKD-EPI Creatinine Equation (2021)    Anion gap 10 5 - 15    Comment: Performed at [facility], [address] [place], [place] [address]  Magnesium      Status: None   Collection Time: 12/19/23  2:41 AM  Result Value Ref Range   Magnesium  2.3 1.7 - 2.4 mg/dL    Comment: Performed at [facility], [address] [place], [place] [address]  CBC     Status:  Abnormal   Collection Time: 12/19/23  2:41 AM  Result Value Ref Range   WBC 7.5 4.0 - 10.5 K/uL   RBC 4.93 4.22 - 5.81 MIL/uL   Hemoglobin 12.1 (L) 13.0 - 17.0 g/dL   HCT 59.6 60.9 - 47.9 %   MCV 81.7 80.0 - 100.0 fL   MCH 24.5 (L) 26.0 - 34.0 pg   MCHC 30.0 30.0 - 36.0 g/dL   RDW 78.7 (H) 88.4 - 84.4 %   Platelets 233 150 - 400 K/uL   nRBC 0.0 0.0 - 0.2 %    Comment: Performed at [facility], [address] [place], [place] [address]  Cooxemetry Panel (carboxy, met, total hgb, O2 sat)     Status: Abnormal   Collection Time: 12/19/23  4:53 AM  Result Value Ref Range   Total hemoglobin 13.8 12.0 - 16.0 g/dL   O2 Saturation 45.0 %   Carboxyhemoglobin 1.7 (H) 0.5 - 1.5 %   Methemoglobin <0.7 0.0 - 1.5 %    Comment: Performed at [facility], [address], [place], [place] [address]  Magnesium      Status: None   Collection Time: 12/20/23 12:28 AM  Result Value Ref Range   Magnesium  2.0 1.7 - 2.4 mg/dL    Comment: Performed at [facility], [address] [place], [place] [address]  CBC     Status: Abnormal   Collection Time: 12/20/23 12:28 AM  Result Value Ref Range   WBC 6.5 4.0 - 10.5 K/uL   RBC 4.86 4.22 - 5.81 MIL/uL   Hemoglobin 11.9 (L) 13.0 - 17.0 g/dL   HCT 60.1 60.9 - 47.9 %   MCV 81.9 80.0 - 100.0 fL   MCH 24.5 (L) 26.0 - 34.0 pg   MCHC 29.9 (L) 30.0 - 36.0 g/dL   RDW 78.1 (H) 88.4 - 84.4 %   Platelets 239 150  - 400 K/uL   nRBC 0.0 0.0 - 0.2 %    Comment: Performed at [facility], [address] [place], [place] [address]  Magnesium      Status: None   Collection Time: 12/20/23  4:30 AM  Result Value Ref Range   Magnesium  2.0 1.7 - 2.4 mg/dL    Comment: Performed at [facility], [address], [place], [place] [address]  Cooxemetry Panel (carboxy, met, total hgb, O2 sat)     Status: Abnormal   Collection Time: 12/20/23  4:30 AM  Result Value Ref Range   Total hemoglobin 12.9 12.0 - 16.0 g/dL   O2 Saturation 25.5 %   Carboxyhemoglobin 2.2 (H) 0.5 - 1.5 %   Methemoglobin <0.7 0.0 - 1.5 %    Comment: Performed at [facility], [address] [place], [place] [address]  Comprehensive metabolic panel with GFR     Status: Abnormal   Collection Time: 12/20/23  4:30 AM  Result Value Ref Range   Sodium 135 135 - 145 mmol/L   Potassium 4.1 3.5 - 5.1 mmol/L   Chloride 93 (L) 98 - 111 mmol/L   CO2 31 22 - 32 mmol/L   Glucose, Bld 94 70 - 99 mg/dL    Comment: Glucose reference range applies only to samples taken after fasting for at least 8 hours.   BUN 35 (H) 8 - 23 mg/dL   Creatinine, Ser 8.66 (H) 0.61 - 1.24 mg/dL   Calcium  9.1 8.9 - 10.3 mg/dL   Total Protein 7.1 6.5 -  8.1 g/dL   Albumin 3.1 (L) 3.5 - 5.0 g/dL   AST 29 15 - 41 U/L   ALT 20 0 - 44 U/L   Alkaline Phosphatase 73 38 - 126 U/L   Total Bilirubin 1.4 (H) 0.0 - 1.2 mg/dL   GFR, Estimated 59 (L) >60 mL/min    Comment: (NOTE) Calculated using the CKD-EPI Creatinine Equation (2021)    Anion gap 11 5 - 15    Comment: Performed at [facility], [address] [place], [place] [address]  Phosphorus     Status: None   Collection Time: 12/20/23  4:30 AM  Result Value Ref Range   Phosphorus 4.2 2.5 - 4.6 mg/dL    Comment: Performed at [facility], [address] [place], [place] [address]  Magnesium      Status: None   Collection Time: 12/21/23  5:20 AM  Result Value Ref Range   Magnesium  2.0 1.7 -  2.4 mg/dL    Comment: Performed at [facility], [address], [place], [place] [address]  Cooxemetry Panel (carboxy, met, total hgb, O2 sat)     Status: Abnormal   Collection Time: 12/21/23  5:20 AM  Result Value Ref Range   Total hemoglobin 13.8 12.0 - 16.0 g/dL   O2 Saturation 41.8 %   Carboxyhemoglobin 1.9 (H) 0.5 - 1.5 %   Methemoglobin 1.6 (H) 0.0 - 1.5 %    Comment: Performed at [facility], [address], [place], [place] [address]  Comprehensive metabolic panel with GFR     Status: Abnormal   Collection Time: 12/21/23  5:20 AM  Result Value Ref Range   Sodium 130 (L) 135 - 145 mmol/L   Potassium 3.6 3.5 - 5.1 mmol/L   Chloride 88 (L) 98 - 111 mmol/L   CO2 31 22 - 32 mmol/L   Glucose, Bld 97 70 - 99 mg/dL    Comment: Glucose reference range applies only to samples taken after fasting for at least 8 hours.   BUN 34 (H) 8 - 23 mg/dL   Creatinine, Ser 8.50 (H) 0.61 - 1.24 mg/dL   Calcium  8.6 (L) 8.9 - 10.3 mg/dL   Total Protein 7.2 6.5 - 8.1 g/dL   Albumin 3.2 (L) 3.5 - 5.0 g/dL   AST 38 15 - 41 U/L   ALT 26 0 - 44 U/L   Alkaline Phosphatase 74 38 - 126 U/L   Total Bilirubin 1.3 (H) 0.0 - 1.2 mg/dL   GFR, Estimated 51 (L) >60 mL/min    Comment: (NOTE) Calculated using the CKD-EPI Creatinine Equation (2021)    Anion gap 11 5 - 15    Comment: Performed at [facility], [address] [place], [place] [address]  Magnesium      Status: Abnormal   Collection Time: 12/22/23  5:45 AM  Result Value Ref Range   Magnesium  2.5 (H) 1.7 - 2.4 mg/dL    Comment: Performed at [facility], [address], [place], [place] [address]  Cooxemetry Panel (carboxy, met, total hgb, O2 sat)     Status: Abnormal   Collection Time: 12/22/23  5:45 AM  Result Value Ref Range   Total hemoglobin 14.6 12.0 - 16.0 g/dL   O2 Saturation 33.8 %   Carboxyhemoglobin 2.5 (H) 0.5 - 1.5 %   Methemoglobin 1.7 (H) 0.0 - 1.5 %    Comment: Performed at [facility], [address],  [place], [place] [address]  Comprehensive metabolic panel with GFR     Status: Abnormal   Collection Time: 12/22/23  5:45 AM  Result Value Ref Range   Sodium 133 (L) 135 - 145 mmol/L   Potassium 4.3 3.5 - 5.1 mmol/L   Chloride 86 (L) 98 - 111 mmol/L   CO2 34 (H) 22 - 32 mmol/L   Glucose, Bld 106 (H) 70 - 99 mg/dL    Comment: Glucose reference range applies only to samples taken after fasting for at least 8 hours.   BUN 41 (H) 8 - 23 mg/dL   Creatinine, Ser 8.36 (H) 0.61 - 1.24 mg/dL   Calcium  9.4 8.9 - 10.3 mg/dL   Total Protein 7.9 6.5 - 8.1 g/dL   Albumin 3.5 3.5 - 5.0 g/dL   AST 44 (H) 15 - 41 U/L   ALT 32 0 - 44 U/L   Alkaline Phosphatase 91 38 - 126 U/L   Total Bilirubin 1.3 (H) 0.0 - 1.2 mg/dL   GFR, Estimated 46 (L) >60 mL/min    Comment: (NOTE) Calculated using the CKD-EPI Creatinine Equation (2021)    Anion gap 13 5 - 15    Comment: Performed at [facility], [address], [place], [place] [address]  CBC with Differential/Platelet     Status: Abnormal   Collection Time: 12/22/23  5:45 AM  Result Value Ref Range   WBC 7.3 4.0 - 10.5 K/uL   RBC 5.60 4.22 - 5.81 MIL/uL   Hemoglobin 13.8 13.0 - 17.0 g/dL   HCT 54.6 60.9 - 47.9 %   MCV 80.9 80.0 - 100.0 fL   MCH 24.6 (L) 26.0 - 34.0 pg   MCHC 30.5 30.0 - 36.0 g/dL   RDW 78.1 (H) 88.4 - 84.4 %   Platelets 317 150 - 400 K/uL   nRBC 0.0 0.0 - 0.2 %   Neutrophils Relative % 67 %   Neutro Abs 4.9 1.7 - 7.7 K/uL   Lymphocytes Relative 17 %   Lymphs Abs 1.2 0.7 - 4.0 K/uL   Monocytes Relative 9 %   Monocytes Absolute 0.7 0.1 - 1.0 K/uL   Eosinophils Relative 5 %   Eosinophils Absolute 0.4 0.0 - 0.5 K/uL   Basophils Relative 2 %   Basophils Absolute 0.1 0.0 - 0.1 K/uL   WBC Morphology See Note     Comment:  Morphology unremarkable   RBC Morphology MORPHOLOGY UNREMARKABLE     Comment:  Morphology unremarkable   Smear Review See Note     Comment:  Normal Platelet Morphology Performed at [facility], [address], [place], [place] [address]   Cooxemetry Panel (carboxy, met, total hgb, O2 sat)     Status: Abnormal   Collection Time: 12/23/23  5:40 AM  Result Value Ref Range   Total hemoglobin 13.2 12.0 - 16.0 g/dL   O2 Saturation 58 %   Carboxyhemoglobin 1.6 (H) 0.5 - 1.5 %   Methemoglobin <0.7 0.0 - 1.5 %    Comment: Performed at [facility], [address] [place], [place] [address]  Magnesium      Status: Abnormal   Collection Time: 12/23/23  9:02 AM  Result Value Ref Range   Magnesium  2.5 (H) 1.7 - 2.4 mg/dL    Comment: Performed at [facility], [address] [place], [place] [address]  Comprehensive metabolic panel with GFR     Status: Abnormal   Collection Time: 12/23/23  9:02  AM  Result Value Ref Range   Sodium 132 (L) 135 - 145 mmol/L   Potassium 3.7 3.5 - 5.1 mmol/L   Chloride 86 (L) 98 - 111 mmol/L   CO2 33 (H) 22 - 32 mmol/L   Glucose, Bld 134 (H) 70 - 99 mg/dL    Comment: Glucose reference range applies only to samples taken after fasting for at least 8 hours.   BUN 37 (H) 8 - 23 mg/dL   Creatinine, Ser 8.36 (H) 0.61 - 1.24 mg/dL   Calcium  9.3 8.9 - 10.3 mg/dL   Total Protein 8.2 (H) 6.5 - 8.1 g/dL   Albumin 3.6 3.5 - 5.0 g/dL   AST 40 15 - 41 U/L   ALT 31 0 - 44 U/L   Alkaline Phosphatase 91 38 - 126 U/L   Total Bilirubin 1.0 0.0 - 1.2 mg/dL   GFR, Estimated 46 (L) >60 mL/min    Comment: (NOTE) Calculated using the CKD-EPI Creatinine Equation (2021)    Anion gap 13 5 - 15    Comment: Performed at [facility], [address] [place], [place] [address]  CBC with Differential/Platelet     Status: Abnormal   Collection Time: 12/23/23  9:02 AM  Result Value Ref Range   WBC 7.1 4.0 - 10.5 K/uL   RBC 5.65 4.22 - 5.81 MIL/uL   Hemoglobin 13.9 13.0 - 17.0 g/dL   HCT 53.7 60.9 - 47.9 %   MCV 81.8 80.0 - 100.0 fL   MCH 24.6 (L) 26.0 - 34.0 pg   MCHC 30.1 30.0 - 36.0 g/dL   RDW 78.2 (H) 88.4 - 84.4 %   Platelets 304 150 - 400  K/uL   nRBC 0.0 0.0 - 0.2 %   Neutrophils Relative % 67 %   Neutro Abs 4.7 1.7 - 7.7 K/uL   Lymphocytes Relative 21 %   Lymphs Abs 1.5 0.7 - 4.0 K/uL   Monocytes Relative 9 %   Monocytes Absolute 0.7 0.1 - 1.0 K/uL   Eosinophils Relative 2 %   Eosinophils Absolute 0.2 0.0 - 0.5 K/uL   Basophils Relative 1 %   Basophils Absolute 0.1 0.0 - 0.1 K/uL   Immature Granulocytes 0 %   Abs Immature Granulocytes 0.01 0.00 - 0.07 K/uL    Comment: Performed at [facility], [address], [place], [place] [address]  Cooxemetry Panel (carboxy, met, total hgb, O2 sat)     Status: Abnormal   Collection Time: 12/24/23  5:20 AM  Result Value Ref Range   Total hemoglobin 14.2 12.0 - 16.0 g/dL   O2 Saturation 51 %   Carboxyhemoglobin 2.0 (H) 0.5 - 1.5 %   Methemoglobin 1.2 0.0 - 1.5 %    Comment: Performed at [facility], [address] [place], [place] [address]  Magnesium      Status: None   Collection Time: 12/24/23  8:53 AM  Result Value Ref Range   Magnesium  2.4 1.7 - 2.4 mg/dL    Comment: Performed at [facility], [address] [place], [place] [address]  Comprehensive metabolic panel with GFR     Status: Abnormal   Collection Time: 12/24/23  8:53 AM  Result Value Ref Range   Sodium 132 (L) 135 - 145 mmol/L   Potassium 3.4 (L) 3.5 - 5.1 mmol/L   Chloride 87 (L) 98 - 111 mmol/L   CO2 33 (H) 22 - 32 mmol/L   Glucose, Bld 137 (H)  70 - 99 mg/dL    Comment: Glucose reference range applies only to samples taken after fasting for at least 8 hours.   BUN 39 (H) 8 - 23 mg/dL   Creatinine, Ser 8.36 (H) 0.61 - 1.24 mg/dL   Calcium  9.3 8.9 - 10.3 mg/dL   Total Protein 8.3 (H) 6.5 - 8.1 g/dL   Albumin 3.7 3.5 - 5.0 g/dL   AST 35 15 - 41 U/L   ALT 30 0 - 44 U/L   Alkaline Phosphatase 88 38 - 126 U/L   Total Bilirubin 1.3 (H) 0.0 - 1.2 mg/dL   GFR, Estimated 46 (L) >60 mL/min    Comment: (NOTE) Calculated using the CKD-EPI Creatinine Equation (2021)    Anion gap 12 5 - 15     Comment: Performed at [facility], [address], [place], [place] [address]  Digoxin  level     Status: Abnormal   Collection Time: 12/24/23  8:53 AM  Result Value Ref Range   Digoxin  Level <0.6 (L) 0.8 - 2.0 ng/mL    Comment: RESULT CONFIRMED BY MANUAL DILUTION Performed at [facility], [address] [place], [place] [address]   CBC with Differential/Platelet     Status: Abnormal   Collection Time: 12/24/23 11:47 AM  Result Value Ref Range   WBC 11.5 (H) 4.0 - 10.5 K/uL   RBC 5.60 4.22 - 5.81 MIL/uL   Hemoglobin 13.9 13.0 - 17.0 g/dL   HCT 53.9 60.9 - 47.9 %   MCV 82.1 80.0 - 100.0 fL   MCH 24.8 (L) 26.0 - 34.0 pg   MCHC 30.2 30.0 - 36.0 g/dL   RDW 77.9 (H) 88.4 - 84.4 %   Platelets 292 150 - 400 K/uL   nRBC 0.0 0.0 - 0.2 %   Neutrophils Relative % 76 %   Neutro Abs 8.6 (H) 1.7 - 7.7 K/uL   Lymphocytes Relative 12 %   Lymphs Abs 1.4 0.7 - 4.0 K/uL   Monocytes Relative 12 %   Monocytes Absolute 1.4 (H) 0.1 - 1.0 K/uL   Eosinophils Relative 0 %   Eosinophils Absolute 0.0 0.0 - 0.5 K/uL   Basophils Relative 0 %   Basophils Absolute 0.0 0.0 - 0.1 K/uL   Immature Granulocytes 0 %   Abs Immature Granulocytes 0.02 0.00 - 0.07 K/uL    Comment: Performed at [facility], [address], [place], [place] [address]  Magnesium      Status: Abnormal   Collection Time: 12/25/23  5:38 AM  Result Value Ref Range   Magnesium  2.6 (H) 1.7 - 2.4 mg/dL    Comment: Performed at [facility], [address] [place], [place] [address]  Cooxemetry Panel (carboxy, met, total hgb, O2 sat)     Status: Abnormal   Collection Time: 12/25/23  5:38 AM  Result Value Ref Range   Total hemoglobin 14.0 12.0 - 16.0 g/dL   O2 Saturation 38.2 %   Carboxyhemoglobin 2.2 (H) 0.5 - 1.5 %   Methemoglobin <0.7 0.0 - 1.5 %    Comment: Performed at [facility], [address] [place], [place] [address]  Basic metabolic panel with GFR     Status: Abnormal   Collection Time:  12/25/23  5:38 AM  Result Value Ref Range   Sodium 131 (L) 135 - 145 mmol/L   Potassium 4.1 3.5 - 5.1 mmol/L   Chloride 89 (L) 98 - 111 mmol/L   CO2  32 22 - 32 mmol/L   Glucose, Bld 140 (H) 70 - 99 mg/dL    Comment: Glucose reference range applies only to samples taken after fasting for at least 8 hours.   BUN 36 (H) 8 - 23 mg/dL   Creatinine, Ser 8.44 (H) 0.61 - 1.24 mg/dL   Calcium  9.2 8.9 - 10.3 mg/dL   GFR, Estimated 49 (L) >60 mL/min    Comment: (NOTE) Calculated using the CKD-EPI Creatinine Equation (2021)    Anion gap 10 5 - 15    Comment: Performed at [facility], [address] [place], [place] [address]  Magnesium      Status: None   Collection Time: 12/26/23  3:59 AM  Result Value Ref Range   Magnesium  2.2 1.7 - 2.4 mg/dL    Comment: Performed at [facility], [address], [place], [place] [address]  Basic metabolic panel with GFR     Status: Abnormal   Collection Time: 12/26/23  3:59 AM  Result Value Ref Range   Sodium 132 (L) 135 - 145 mmol/L   Potassium 3.9 3.5 - 5.1 mmol/L   Chloride 90 (L) 98 - 111 mmol/L   CO2 29 22 - 32 mmol/L   Glucose, Bld 105 (H) 70 - 99 mg/dL    Comment: Glucose reference range applies only to samples taken after fasting for at least 8 hours.   BUN 41 (H) 8 - 23 mg/dL   Creatinine, Ser 8.37 (H) 0.61 - 1.24 mg/dL   Calcium  8.9 8.9 - 10.3 mg/dL   GFR, Estimated 46 (L) >60 mL/min    Comment: (NOTE) Calculated using the CKD-EPI Creatinine Equation (2021)    Anion gap 13 5 - 15    Comment: Performed at [facility], [address], [place], [place] [address]  Magnesium      Status: None   Collection Time: 12/27/23 12:03 PM  Result Value Ref Range   Magnesium  2.0 1.7 - 2.4 mg/dL    Comment: Performed at [facility], [address], [place], [place] [address]  Basic metabolic panel with GFR     Status: Abnormal   Collection Time: 12/27/23 12:03 PM  Result Value Ref Range   Sodium 130 (L) 135 - 145 mmol/L    Potassium 3.7 3.5 - 5.1 mmol/L   Chloride 89 (L) 98 - 111 mmol/L   CO2 26 22 - 32 mmol/L   Glucose, Bld 118 (H) 70 - 99 mg/dL    Comment: Glucose reference range applies only to samples taken after fasting for at least 8 hours.   BUN 41 (H) 8 - 23 mg/dL   Creatinine, Ser 8.33 (H) 0.61 - 1.24 mg/dL   Calcium  9.0 8.9 - 10.3 mg/dL   GFR, Estimated 45 (L) >60 mL/min    Comment: (NOTE) Calculated using the CKD-EPI Creatinine Equation (2021)    Anion gap 15 5 - 15    Comment: Performed at [facility], [address], [place], [place] [address]  Basic Metabolic Panel (BMET)     Status: Abnormal   Collection Time: 12/30/23  3:52 PM  Result Value Ref Range   Sodium 136 135 - 145 mmol/L   Potassium 3.6 3.5 - 5.1 mmol/L   Chloride 97 (L) 98 - 111 mmol/L   CO2 25 22 - 32 mmol/L   Glucose, Bld 118 (H) 70 - 99 mg/dL    Comment: Glucose reference range applies only to samples taken after  fasting for at least 8 hours.   BUN 27 (H) 8 - 23 mg/dL   Creatinine, Ser 8.59 (H) 0.61 - 1.24 mg/dL   Calcium  9.1 8.9 - 10.3 mg/dL   GFR, Estimated 55 (L) >60 mL/min    Comment: (NOTE) Calculated using the CKD-EPI Creatinine Equation (2021)    Anion gap 14 5 - 15    Comment: Performed at [facility], [address] [place], [place] [address]  B Nat Peptide     Status: Abnormal   Collection Time: 12/30/23  3:52 PM  Result Value Ref Range   B Natriuretic Peptide 718.5 (H) 0.0 - 100.0 pg/mL    Comment: Performed at [facility], [address], [place], [place] [address]  Digoxin  level     Status: Abnormal   Collection Time: 12/30/23  3:52 PM  Result Value Ref Range   Digoxin  Level <0.6 (L) 0.8 - 2.0 ng/mL    Comment: RESULTS CONFIRMED BY MANUAL DILUTION Performed at [facility], [address], [place], [place] [address]   Magnesium      Status: None   Collection Time: 12/30/23  3:52 PM  Result Value Ref Range   Magnesium  2.1 1.7 - 2.4 mg/dL    Comment: Performed at [facility], [address], [place], [place] [address]  CBC     Status: Abnormal   Collection Time: 12/30/23  3:52 PM  Result Value Ref Range   WBC 8.7 4.0 - 10.5 K/uL   RBC 6.18 (H) 4.22 - 5.81 MIL/uL   Hemoglobin 15.4 13.0 - 17.0 g/dL   HCT 48.6 60.9 - 47.9 %   MCV 83.0 80.0 - 100.0 fL   MCH 24.9 (L) 26.0 - 34.0 pg   MCHC 30.0 30.0 - 36.0 g/dL   RDW 77.2 (H) 88.4 - 84.4 %   Platelets 263 150 - 400 K/uL   nRBC 0.0 0.0 - 0.2 %    Comment: Performed at [facility], [address] [place], [place] [address]  CUP PACEART REMOTE DEVICE CHECK     Status: None   Collection Time: 01/08/24  3:34 AM  Result Value Ref Range   Date Time Interrogation Session [phone]    Pulse Generator Manufacturer MERM    Pulse Gen Model [id number] Evera MRI XT DR    Pulse Gen Serial Number [id number]    Clinic Name [facility]    Implantable Pulse Generator Type Implantable Cardiac Defibulator    Implantable Pulse Generator Implant Date [id number]    Implantable Lead Manufacturer [employer] [facility]    Implantable Lead Model [id number] [employer] Quattro Secure S    Implantable Lead Serial Number [id number]    Implantable Lead Implant Date 79869284    Implantable Lead Location [id number]    Implantable Lead Connection Status [id number]    Implantable Lead Manufacturer [facility]    Implantable Lead Model 5076 CapSureFix Novus    Implantable Lead Serial Number [id number]    Implantable Lead Implant Date 79939883    Implantable Lead Location [id number]    Implantable Lead Connection Status [id number]    Lead Channel Setting Sensing Sensitivity 0.3 mV   Lead Channel Setting Pacing Pulse Width 0.4 ms   Lead Channel Setting Pacing Amplitude 2 V   Zone Setting Status Active    Zone Setting Status Inactive    Zone Setting Status Active    Zone Setting Status Inactive    Zone Setting Status [phone]    Lead  Channel Impedance Value 1,805 ohm   Lead Channel Sensing Intrinsic Amplitude 4.125 mV   Lead Channel Sensing Intrinsic Amplitude  4.125 mV   Lead Channel Impedance Value 513 ohm   Lead Channel Impedance Value 437 ohm   Lead Channel Sensing Intrinsic Amplitude 25.75 mV   Lead Channel Sensing Intrinsic Amplitude 25.75 mV   Lead Channel Pacing Threshold Amplitude 0.5 V   Lead Channel Pacing Threshold Pulse Width 0.4 ms   HighPow Impedance 75 ohm   Battery Status OK    Battery Remaining Longevity 60 mo   Battery Voltage 2.98 V   Brady Statistic RA Percent Paced 0 %   Brady Statistic RV Percent Paced 24.4 %   Brady Statistic AP VP Percent 0 %   Brady Statistic AS VP Percent 22.86 %   Brady Statistic AP VS Percent 0 %   Brady Statistic AS VS Percent 77.14 %  Basic Metabolic Panel (BMET)     Status: Abnormal   Collection Time: 01/20/24  3:36 PM  Result Value Ref Range   Sodium 137 135 - 145 mmol/L   Potassium 3.9 3.5 - 5.1 mmol/L   Chloride 95 (L) 98 - 111 mmol/L   CO2 30 22 - 32 mmol/L   Glucose, Bld 117 (H) 70 - 99 mg/dL    Comment: Glucose reference range applies only to samples taken after fasting for at least 8 hours.   BUN 22 8 - 23 mg/dL   Creatinine, Ser 8.36 (H) 0.61 - 1.24 mg/dL   Calcium  9.2 8.9 - 10.3 mg/dL   GFR, Estimated 46 (L) >60 mL/min    Comment: (NOTE) Calculated using the CKD-EPI Creatinine Equation (2021)    Anion gap 12 5 - 15    Comment: Performed at [facility], [address], [place], [place] [address]  B Nat Peptide     Status: Abnormal   Collection Time: 01/20/24  3:36 PM  Result Value Ref Range   B Natriuretic Peptide 410.1 (H) 0.0 - 100.0 pg/mL    Comment: Performed at [facility], [address], [place], [place] [address]  CBC with Differential     Status: Abnormal   Collection Time: 01/28/24  7:01 PM  Result Value Ref Range   WBC 8.6 4.0 - 10.5 K/uL   RBC 6.02 (H) 4.22 - 5.81 MIL/uL   Hemoglobin 16.1 13.0 - 17.0 g/dL   HCT 48.4 60.9 - 47.9 %   MCV 85.5 80.0 - 100.0 fL   MCH 26.7 26.0 - 34.0 pg   MCHC 31.3 30.0 - 36.0 g/dL   RDW 77.3 (H) 88.4 - 84.4 %    Platelets 309 150 - 400 K/uL   nRBC 0.0 0.0 - 0.2 %   Neutrophils Relative % 74 %   Neutro Abs 6.5 1.7 - 7.7 K/uL   Lymphocytes Relative 14 %   Lymphs Abs 1.2 0.7 - 4.0 K/uL   Monocytes Relative 10 %   Monocytes Absolute 0.8 0.1 - 1.0 K/uL   Eosinophils Relative 1 %   Eosinophils Absolute 0.0 0.0 - 0.5 K/uL   Basophils Relative 1 %   Basophils Absolute 0.0 0.0 - 0.1 K/uL   Immature Granulocytes 0 %   Abs Immature Granulocytes 0.02 0.00 - 0.07 K/uL    Comment: Performed at [facility], [address], [place], [place] [address]  Basic metabolic panel     Status: Abnormal   Collection Time: 01/28/24  7:01 PM  Result  Value Ref Range   Sodium 137 135 - 145 mmol/L   Potassium 3.8 3.5 - 5.1 mmol/L   Chloride 95 (L) 98 - 111 mmol/L   CO2 26 22 - 32 mmol/L   Glucose, Bld 111 (H) 70 - 99 mg/dL    Comment: Glucose reference range applies only to samples taken after fasting for at least 8 hours.   BUN 40 (H) 8 - 23 mg/dL   Creatinine, Ser 8.25 (H) 0.61 - 1.24 mg/dL   Calcium  9.4 8.9 - 10.3 mg/dL   GFR, Estimated 42 (L) >60 mL/min    Comment: (NOTE) Calculated using the CKD-EPI Creatinine Equation (2021)    Anion gap 16 (H) 5 - 15    Comment: Performed at [facility], [address] [place], [place] [address]  Surgical PCR screen     Status: None   Collection Time: 01/29/24 10:48 AM   Specimen: Nasal Mucosa; Nasal Swab  Result Value Ref Range   MRSA, PCR NEGATIVE NEGATIVE   Staphylococcus aureus NEGATIVE NEGATIVE    Comment: (NOTE) The Xpert SA Assay (FDA approved for NASAL specimens in patients 59 years of age and older), is one component of a comprehensive surveillance program. It is not intended to diagnose infection nor to guide or monitor treatment. Performed at [facility], [address] [place], [place] [address]   Glucose, capillary     Status: Abnormal   Collection Time: 01/29/24  1:08 PM  Result Value Ref Range   Glucose-Capillary 119 (H) 70 -  99 mg/dL    Comment: Glucose reference range applies only to samples taken after fasting for at least 8 hours.   Comment 1 Call MD NNP PA CNM    Comment 2 Document in Chart   Glucose, capillary     Status: Abnormal   Collection Time: 01/29/24  3:46 PM  Result Value Ref Range   Glucose-Capillary 153 (H) 70 - 99 mg/dL    Comment: Glucose reference range applies only to samples taken after fasting for at least 8 hours.  CBC     Status: Abnormal   Collection Time: 01/30/24  4:34 AM  Result Value Ref Range   WBC 11.2 (H) 4.0 - 10.5 K/uL   RBC 5.85 (H) 4.22 - 5.81 MIL/uL   Hemoglobin 15.6 13.0 - 17.0 g/dL   HCT 49.8 60.9 - 47.9 %   MCV 85.6 80.0 - 100.0 fL   MCH 26.7 26.0 - 34.0 pg   MCHC 31.1 30.0 - 36.0 g/dL   RDW 77.9 (H) 88.4 - 84.4 %   Platelets 241 150 - 400 K/uL   nRBC 0.0 0.0 - 0.2 %    Comment: Performed at [facility], [address] [place], [place] [address]  Basic metabolic panel with GFR     Status: Abnormal   Collection Time: 01/30/24  4:34 AM  Result Value Ref Range   Sodium 134 (L) 135 - 145 mmol/L   Potassium 3.9 3.5 - 5.1 mmol/L   Chloride 96 (L) 98 - 111 mmol/L   CO2 27 22 - 32 mmol/L   Glucose, Bld 121 (H) 70 - 99 mg/dL    Comment: Glucose reference range applies only to samples taken after fasting for at least 8 hours.   BUN 27 (H) 8 - 23 mg/dL   Creatinine, Ser 8.62 (H) 0.61 - 1.24 mg/dL   Calcium  9.0 8.9 - 10.3 mg/dL   GFR, Estimated 56 (L) >60 mL/min  Comment: (NOTE) Calculated using the CKD-EPI Creatinine Equation (2021)    Anion gap 11 5 - 15    Comment: Performed at [facility], [address], [place], [place] [address]  CBC with Differential/Platelet     Status: Abnormal   Collection Time: 01/31/24  4:39 AM  Result Value Ref Range   WBC 13.2 (H) 4.0 - 10.5 K/uL   RBC 5.88 (H) 4.22 - 5.81 MIL/uL   Hemoglobin 15.9 13.0 - 17.0 g/dL   HCT 48.9 60.9 - 47.9 %   MCV 86.7 80.0 - 100.0 fL   MCH 27.0 26.0 - 34.0 pg   MCHC 31.2 30.0 -  36.0 g/dL   RDW 78.1 (H) 88.4 - 84.4 %   Platelets 207 150 - 400 K/uL   nRBC 0.0 0.0 - 0.2 %   Neutrophils Relative % 71 %   Neutro Abs 9.5 (H) 1.7 - 7.7 K/uL   Lymphocytes Relative 9 %   Lymphs Abs 1.2 0.7 - 4.0 K/uL   Monocytes Relative 18 %   Monocytes Absolute 2.3 (H) 0.1 - 1.0 K/uL   Eosinophils Relative 1 %   Eosinophils Absolute 0.1 0.0 - 0.5 K/uL   Basophils Relative 0 %   Basophils Absolute 0.0 0.0 - 0.1 K/uL   WBC Morphology MORPHOLOGY UNREMARKABLE    RBC Morphology MORPHOLOGY UNREMARKABLE    Smear Review Normal platelet morphology    Immature Granulocytes 1 %   Abs Immature Granulocytes 0.09 (H) 0.00 - 0.07 K/uL    Comment: Performed at [facility], [address], [place], [place] [address]  Basic metabolic panel with GFR     Status: Abnormal   Collection Time: 01/31/24  4:39 AM  Result Value Ref Range   Sodium 136 135 - 145 mmol/L   Potassium 3.9 3.5 - 5.1 mmol/L   Chloride 93 (L) 98 - 111 mmol/L   CO2 33 (H) 22 - 32 mmol/L   Glucose, Bld 124 (H) 70 - 99 mg/dL    Comment: Glucose reference range applies only to samples taken after fasting for at least 8 hours.   BUN 28 (H) 8 - 23 mg/dL   Creatinine, Ser 8.43 (H) 0.61 - 1.24 mg/dL   Calcium  8.8 (L) 8.9 - 10.3 mg/dL   GFR, Estimated 48 (L) >60 mL/min    Comment: (NOTE) Calculated using the CKD-EPI Creatinine Equation (2021)    Anion gap 10 5 - 15    Comment: Performed at [facility], [address] [place], [place] [address]      ## Disposition:-- There are no psychiatric contraindications to discharge at this time  ## Behavioral / Environmental: - No specific recommendations at this time.     ## Safety and Observation Level:  - Based on my clinical evaluation, I estimate the patient to be at minimal risk of self harm in the current setting. - At this time, we recommend  routine. This decision is based on my review of the chart including patient's history and current presentation, interview of  the patient, mental status examination, and consideration of suicide risk including evaluating suicidal ideation, plan, intent, suicidal or self-harm behaviors, risk factors, and protective factors. This judgment is based on our ability to directly address suicide risk, implement suicide prevention strategies, and develop a safety plan while the patient is in the clinical setting. Please contact our team if there is a concern that risk level has changed.  CSSR Risk Category:C-SSRS RISK CATEGORY:  No Risk  Suicide Risk Assessment: Patient has following modifiable risk factors for suicide: NA, which we are addressing by NA. Patient has following non-modifiable or demographic risk factors for suicide: male gender Patient has the following protective factors against suicide: no history of suicide attempts  Thank you for this consult request. Recommendations have been communicated to the primary team.  We will sign off at this time.   Douglas LITTIE Glatter, DO       History of Present Illness   Patient Report:  On initial examination, patient was seen laying in bed on my approach with nursing staff at bedside. He reports that prior to coming to the hospital he had a fall that resulted in him breaking hi finger and dislocating his knee cap. He reports that he is still in pain but he is feeling better than he was when he first came to the hospital. He is currently living in a trailer by himself and he reports that he has a neighbor that is his power of attorney. The patient denies any paranoid concerns about his treatment in the hospital and commented that the nurses have treated him well. He was confused about the reports that he had been displaying aggressive behavior.  Per nurse at bedside this is her first day working with the patient but he has been pleasant and cooperative. She was also confused when she saw the consult for agitation. She did not get anything in check out from last night that would  suggest that the patient had any issues.  The patient denies any SI/HI/AVH or paranoid thoughts.    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 Living Situation: Home alone Spiritual Hx: Christian Access to weapons/lethal means: Denies   Substance History Alcohol : Denies  Type of alcohol  NA Last Drink NA Number of drinks per day NA History of alcohol  withdrawal seizures NA History of DT's NA Tobacco: Denies Illicit drugs: Denies Prescription drug abuse: Denies Rehab hx: AN  Exam Findings  Physical Exam:  Vital Signs:  Temp:  [98.2 F (36.8 C)-100 F (37.8 C)] 98.2 F (36.8 C) (12/05 0923) Pulse Rate:  [76-93] 93 (12/05 0923) Resp:  [18-20] 20 (12/05 0923) BP: (98-150)/(50-89) 120/79 (12/05 0923) SpO2:  [92 %-96 %] 96 % (12/05 0949) Weight:  [109.9 kg] 109.9 kg (12/05 0323) Blood pressure 120/79, pulse 93, temperature 98.2 F (36.8 C), temperature source Oral, resp. rate 20, height 5' 9 (1.753 m), weight 109.9 kg, SpO2 96%. Body mass index is 35.78 kg/m.  Physical Exam  Mental Status Exam: General Appearance: Fairly Groomed  Orientation:  Full (Time, Place, and Person)  Memory:  Good  Concentration:  Good  Recall:  Good  Attention  Good  Eye Contact:  Good  Speech:  Clear and Coherent  Language:  Good  Volume:  Normal  Mood: Fine  Affect:  Appropriate  Thought Process:  Coherent  Thought Content:   Logical  Suicidal Thoughts:  No  Homicidal Thoughts:  No  Judgement:  Good  Insight:  Good  Psychomotor Activity:  Normal  Akathisia:  NA  Fund of Knowledge:  Good      Assets:  Communication Skills  Cognition:  WNL  ADL's:  Intact  AIMS (if indicated):        Other History   These have been pulled in through the EMR, reviewed, and updated if appropriate.  Family History:  The patient's family history includes Heart attack in his father.  Medical History: Past Medical History:  Diagnosis Date   A-fib (HCC)    AICD (automatic cardioverter/defibrillator) present    [employer]tronic   Anemia    Asthma    CAD (coronary artery disease)    CHF (congestive heart failure) (HCC)    Diabetes mellitus without complication (HCC)    pt denies   Dyslipidemia    Elevated cholesterol    Hypertension     Surgical History: Past Surgical History:  Procedure Laterality Date   CARDIAC CATHETERIZATION  1996   stent placement   CARDIAC DEFIBRILLATOR PLACEMENT     CLOSED REDUCTION FINGER WITH PERCUTANEOUS PINNING Right 01/29/2024   Procedure: CLOSED REDUCTION, FINGER, WITH PERCUTANEOUS PINNING;  Surgeon: Arlinda Buster, MD;  Location: [facility] OR;  Service: Orthopedics;  Laterality: Right;  PINNING RIGHT INDEX FINGER   CORONARY ARTERY BYPASS GRAFT  2002   times 4   CORONARY ARTERY BYPASS GRAFT     EYE SURGERY     ICD GENERATOR CHANGEOUT N/A 09/30/2018   Procedure: ICD GENERATOR CHANGEOUT;  Surgeon: Waddell Danelle ORN, MD;  Location: [facility] INVASIVE CV LAB;  Service: Cardiovascular;  Laterality: N/A;   IMPLANTABLE CARDIOVERTER DEFIBRILLATOR (ICD) GENERATOR CHANGE N/A 09/10/2011   Procedure: ICD GENERATOR CHANGE;  Surgeon: Danelle ORN Waddell, MD;  [facility]: [facility] CATH LAB;  Service: Cardiovascular;  Laterality: N/A;   INSERT / REPLACE / REMOVE PACEMAKER     LEFT HEART CATHETERIZATION WITH CORONARY ANGIOGRAM N/A 08/29/2011   Procedure: LEFT HEART CATHETERIZATION WITH CORONARY ANGIOGRAM;  Surgeon: Peter M Jordan, MD;   Location: [facility] CATH LAB;  Service: Cardiovascular;  Laterality: N/A;   OPEN REDUCTION INTERNAL FIXATION (ORIF) FINGER WITH RADIAL BONE GRAFT Right 01/29/2024   Procedure: OPEN REDUCTION INTERNAL FIXATION (ORIF) FINGER WITH RADIAL BONE GRAFT;  Surgeon: Arlinda Buster, MD;  Location: [facility] OR;  Service: Orthopedics;  Laterality: Right;  ORIF RIGHT INDEX FINGER PIP   RIGHT/LEFT HEART CATH AND CORONARY/GRAFT ANGIOGRAPHY N/A 12/18/2023   Procedure: RIGHT/LEFT HEART CATH AND CORONARY/GRAFT ANGIOGRAPHY;  Surgeon: Cherrie Toribio SAUNDERS, MD;  Location: [facility] INVASIVE CV LAB;  Service: Cardiovascular;  Laterality: N/A;     Medications:   Current Facility-Administered Medications:    acetaminophen  (TYLENOL ) tablet 650 mg, 650 mg, Oral, Q6H PRN, Opyd, Timothy S, MD   albuterol  (PROVENTIL ) (2.5 MG/3ML) 0.083% nebulizer solution 2.5 mg, 2.5 mg, Nebulization, Q6H PRN, Opyd, Timothy S, MD   ALPRAZolam  (XANAX ) tablet 1 mg, 1 mg, Oral, QHS PRN, Opyd, Timothy S, MD, 1 mg at 01/30/24 2145   amiodarone  (PACERONE ) tablet 200 mg, 200 mg, Oral, BID, Opyd, Timothy S, MD, 200 mg at 01/31/24 0957   citalopram  (CELEXA ) tablet 20 mg, 20 mg, Oral, Daily, Opyd, Timothy S, MD, 20 mg at 01/31/24 9041   digoxin  (LANOXIN ) tablet 0.0625 mg, 0.0625 mg, Oral, Daily, Opyd, Timothy S, MD, 0.0625 mg at 01/31/24 9041   fluticasone  furoate-vilanterol (BREO ELLIPTA ) 200-25 MCG/ACT 1 puff, 1 puff, Inhalation, Daily, Opyd, Timothy S, MD, 1 puff at 01/31/24 0949   HYDROmorphone  (DILAUDID ) injection 0.5 mg, 0.5  mg, Intravenous, Q2H PRN, Opyd, Timothy S, MD, 0.5 mg at 01/31/24 9382   metoprolol  succinate (TOPROL -XL) 24 hr tablet 25 mg, 25 mg, Oral, Daily, Opyd, Timothy S, MD, 25 mg at 01/31/24 0957   mexiletine (MEXITIL ) capsule 150 mg, 150 mg, Oral, Q12H, Opyd, Timothy S, MD, 150 mg at 01/31/24 9042   mupirocin  ointment (BACTROBAN ) 2 % 1 Application, 1 Application, Nasal, BID, Jillian Buttery, MD, 1 Application at 01/31/24 9041   Oral care mouth  rinse, 15 mL, Mouth Rinse, PRN, Jillian, Amrit, MD   oxyCODONE  (Oxy IR/ROXICODONE ) immediate release tablet 5 mg, 5 mg, Oral, Q4H PRN, Opyd, Timothy S, MD, 5 mg at 01/31/24 1007   pantoprazole  (PROTONIX ) EC tablet 40 mg, 40 mg, Oral, Daily, Opyd, Timothy S, MD, 40 mg at 01/31/24 9041   potassium chloride  SA (KLOR-CON  M) CR tablet 20 mEq, 20 mEq, Oral, Daily, Opyd, Timothy S, MD, 20 mEq at 01/31/24 0957   prochlorperazine  (COMPAZINE ) injection 10 mg, 10 mg, Intravenous, Q6H PRN, Swayze, Ava, DO, 10 mg at 01/31/24 9382   rosuvastatin  (CRESTOR ) tablet 40 mg, 40 mg, Oral, Daily, Opyd, Timothy S, MD, 40 mg at 01/31/24 0957   senna (SENOKOT) tablet 8.6 mg, 1 tablet, Oral, Daily PRN, Opyd, Evalene RAMAN, MD   sodium chloride  flush (NS) 0.9 % injection 3 mL, 3 mL, Intravenous, Q12H, Opyd, Timothy S, MD, 3 mL at 01/31/24 9041   spironolactone  (ALDACTONE ) tablet 25 mg, 25 mg, Oral, Daily, Opyd, Timothy S, MD, 25 mg at 01/31/24 9042   tamsulosin  (FLOMAX ) capsule 0.4 mg, 0.4 mg, Oral, Daily, Opyd, Timothy S, MD, 0.4 mg at 01/31/24 0957   torsemide  (DEMADEX ) tablet 60 mg, 60 mg, Oral, Daily, Opyd, Timothy S, MD, 60 mg at 01/31/24 0957  Allergies: Allergies  Allergen Reactions   Xarelto  [Rivaroxaban ] Other (See Comments)    Bleeding ulcers   Codeine Hives and Itching   Lasix  [Furosemide ] Other (See Comments)    Patient says he gets light headed and dizzy.    Vioxx [Rofecoxib] Palpitations and Other (See Comments)    Caused massive heart attack    Douglas LITTIE Glatter, DO

## 2024-01-31 NOTE — Evaluation (Signed)
 Occupational Therapy Evaluation Patient Details Name: Douglas [name] MRN: [id number] DOB: 27-Oct-1955 Today's Date: 01/31/2024   History of Present Illness   68 y.o. M admitted 12/2 due to R index finger PIP dislocation and L tibial plateau fx after a fall. Pt s/p R index finger PIP capsulotomy and open reduction and stabilization with pinning. MHx: obesity, T2DM, HTN, HLD, Afib on Eliquis , CAD s/p CABG, ICM, CHF, AICD, anemia, asthma, opiate dependence, R eye blindness     Clinical Impressions Patient is s/p R index finger PIP capsulotomy and open reduction and stabilization with pinning surgery resulting in functional limitations due to the deficits listed below (see OT Problem List). Prior to admit, pt reports that he lives alone and is able to complete ADL tasks at Mod I level while using a RW for ambulation. Patient will benefit from acute skilled OT to increase their safety and independence with ADL and functional mobility for ADL to allow facilitate discharge. Patient will benefit from continued inpatient follow up therapy, <3 hours/day.       If plan is discharge home, recommend the following:   Two people to help with walking and/or transfers;Two people to help with bathing/dressing/bathroom;Assistance with cooking/housework;Assist for transportation;Help with stairs or ramp for entrance     Functional Status Assessment   Patient has had a recent decline in their functional status and demonstrates the ability to make significant improvements in function in a reasonable and predictable amount of time.     Equipment Recommendations   Tub/shower bench      Precautions/Restrictions   Precautions Precautions: Fall Recall of Precautions/Restrictions: Intact Restrictions Weight Bearing Restrictions Per Provider Order: Yes RUE Weight Bearing Per Provider Order: Non weight bearing LLE Weight Bearing Per Provider Order: Non weight bearing     Mobility Bed  Mobility Overal bed mobility: Needs Assistance Bed Mobility: Supine to Sit, Sit to Supine     Supine to sit: Max assist, +2 for physical assistance, HOB elevated, Used rails Sit to supine: Max assist, +2 for physical assistance   General bed mobility comments: Pt unable to demonstrate lateral scoot while seated EOB.    Transfers Overall transfer level: Needs assistance Equipment used: Right platform walker Transfers: Sit to/from Stand    General transfer comment: Attempted sit to stand (2 trials) while seated EOB. pt unable to maintain weightbearing restrictions to LLE.      Balance Overall balance assessment: Needs assistance Sitting-balance support: No upper extremity supported, Feet supported Sitting balance-Leahy Scale: Fair         Standing balance comment: unable to achieve sit to stand safely        ADL either performed or assessed with clinical judgement   ADL Overall ADL's : Needs assistance/impaired     Grooming: Wash/dry face;Wash/dry hands;Set up;Bed level   Upper Body Bathing: Maximal assistance;Sitting   Lower Body Bathing: Total assistance;Sitting/lateral leans;Bed level   Upper Body Dressing : Total assistance;Sitting   Lower Body Dressing: Total assistance;Bed level     Toilet Transfer Details (indicate cue type and reason): unable to perform d/t safety reasons Toileting- Clothing Manipulation and Hygiene: Total assistance;+2 for physical assistance;Bed level               Vision Baseline Vision/History: 0 No visual deficits (R eye blindness) Ability to See in Adequate Light: 0 Adequate Patient Visual Report: No change from baseline       Perception Perception: Not tested       Praxis Praxis: Not tested  Pertinent Vitals/Pain Pain Assessment Pain Assessment: 0-10 Pain Score: 9  Pain Location: L knee, R hand Pain Descriptors / Indicators: Aching, Grimacing, Guarding Pain Intervention(s): Limited activity within patient's  tolerance, Monitored during session, Premedicated before session, Repositioned     Extremity/Trunk Assessment Upper Extremity Assessment Upper Extremity Assessment: Right hand dominant;RUE deficits/detail RUE Deficits / Details: post op R index finger reduction & stabilization of PIP 12/3. Pt in post op clamshell splint. Pt unable to demonstrate any active shoulder movement when requested. Stated that it has been sore/painful since his fall. I did not see any imaging completed since fall to address. Pt complaining of soreness with passive shoulder flexion. RUE: Unable to fully assess due to pain RUE Coordination: decreased fine motor;decreased gross motor   Lower Extremity Assessment Lower Extremity Assessment: Defer to PT evaluation   Cervical / Trunk Assessment Cervical / Trunk Assessment: Kyphotic   Communication Communication Communication: Impaired Factors Affecting Communication: Hearing impaired   Cognition Arousal: Alert Behavior During Therapy: WFL for tasks assessed/performed Cognition: No apparent impairments    OT - Cognition Comments: Occassional redirection provided to stay on task.      Following commands: Intact       Cueing  General Comments   Cueing Techniques: Verbal cues              Home Living Family/patient expects to be discharged to:: Private residence Living Arrangements: Alone Available Help at Discharge: Friend(s);Available PRN/intermittently Type of Home: Mobile home Home Access: Stairs to enter Entrance Stairs-Number of Steps: 3-4 steps Entrance Stairs-Rails: Can reach both Home Layout: One level     Bathroom Shower/Tub: Chief Strategy Officer: Standard Bathroom Accessibility: Yes   Home Equipment: Cane - single point;Rolling Walker (2 wheels)          Prior Functioning/Environment Prior Level of Function : Independent/Modified Independent;Driving;History of Falls (last six months)             Mobility  Comments: Typically uses RW both in the house. Has not fallen other than the fall that brought him to the hospital.      OT Problem List: Decreased strength;Decreased coordination;Pain;Decreased range of motion;Increased edema;Decreased activity tolerance;Decreased safety awareness;Impaired balance (sitting and/or standing);Impaired UE functional use   OT Treatment/Interventions: Self-care/ADL training;Therapeutic exercise;Splinting;Therapeutic activities;Visual/perceptual remediation/compensation;Energy conservation;DME and/or AE instruction;Patient/family education;Balance training;Manual therapy;Modalities      OT Goals(Current goals can be found in the care plan section)   Acute Rehab OT Goals Patient Stated Goal: to get stronger OT Goal Formulation: With patient Time For Goal Achievement: 02/14/24 Potential to Achieve Goals: Good   OT Frequency:  Min 2X/week       AM-PAC OT 6 Clicks Daily Activity     Outcome Measure Help from another person eating meals?: None Help from another person taking care of personal grooming?: A Little Help from another person toileting, which includes using toliet, bedpan, or urinal?: Total Help from another person bathing (including washing, rinsing, drying)?: Total Help from another person to put on and taking off regular upper body clothing?: Total Help from another person to put on and taking off regular lower body clothing?: Total 6 Click Score: 11   End of Session Equipment Utilized During Treatment: Gait belt;Other (comment) (PFRW)  Activity Tolerance: Patient limited by pain Patient left: in bed;with call bell/phone within reach;with bed alarm set  OT Visit Diagnosis: Unsteadiness on feet (R26.81);Muscle weakness (generalized) (M62.81);History of falling (Z91.81);Pain Pain - Right/Left:  (right and left) Pain - part of  body: Shoulder;Arm;Hand;Leg                Time: 8841-8772 OT Time Calculation (min): 29 min Charges:  OT General  Charges $OT Visit: 1 Visit OT Evaluation $OT Eval Moderate Complexity: 1 Mod OT Treatments $Therapeutic Activity: 8-22 mins   Leita Howell, OTR/L,CBIS  Supplemental OT - [facility] and [facility] Secure Chat Preferred    Zackrey Dyar, Leita BIRCH 01/31/2024, 2:26 PM

## 2024-02-01 DIAGNOSIS — S61209A Unspecified open wound of unspecified finger without damage to nail, initial encounter: Secondary | ICD-10-CM | POA: Diagnosis not present

## 2024-02-01 DIAGNOSIS — S63289A Dislocation of proximal interphalangeal joint of unspecified finger, initial encounter: Secondary | ICD-10-CM | POA: Diagnosis not present

## 2024-02-01 MED ORDER — ONDANSETRON HCL 4 MG PO TABS
4.0000 mg | ORAL_TABLET | Freq: Once | ORAL | Status: AC
  Administered 2024-02-01: 4 mg via ORAL
  Filled 2024-02-01: qty 1

## 2024-02-01 NOTE — Plan of Care (Signed)

## 2024-02-01 NOTE — Plan of Care (Signed)
  Problem: Health Behavior/Discharge Planning: Goal: Ability to manage health-related needs will improve Outcome: Progressing   Problem: Clinical Measurements: Goal: Will remain free from infection Outcome: Progressing   Problem: Coping: Goal: Level of anxiety will decrease Outcome: Progressing   Problem: Elimination: Goal: Will not experience complications related to bowel motility Outcome: Progressing   Problem: Elimination: Goal: Will not experience complications related to urinary retention Outcome: Progressing   Problem: Safety: Goal: Ability to remain free from injury will improve Outcome: Progressing   Problem: Skin Integrity: Goal: Risk for impaired skin integrity will decrease Outcome: Progressing

## 2024-02-01 NOTE — Progress Notes (Signed)
 Progress Note   Patient: Douglas [name] FMW:[id number] DOB: 1955/03/16 DOA: 01/28/2024     3 DOS: the patient was seen and examined on 02/01/2024   Brief hospital course: Patient is a 68 year old male with past medical history significant for coronary artery disease status post CABG, A-fib on Eliquis , history of VT, HFrEF with ICD and CKD stage III.  Patient presented with injury to the right second finger.  Patient underwent right index finger reduction and stabilization of PEP index finger dislocation by hand surgery on 01/29/2024.    The patient also has an injury to his left knee with pain and swelling CT of the knee was obtained which demonstrated moderate volume lipohemarthrosis with a non-displaced fracture of the posterolateral tibial plateau with approximately 3 mm of articular surface depression posteriorly. There is mild tricompartmental osteoarthritis. Ligaments were suboptimally evaluated by CT. Muscles are within normal limits. Quadriceps and patellar tendons are intact. A Baker's cyst is noted. The knee has been evaluted by orthopedic surgery. They plan for non-operative management with NWG LLE. He will follow up with Dr. Sherida for this in 2 weeks.   Patient is awaiting discharge to SNF.  02/01/2024: Patient seen.  No new complaints.  Awaiting disposition.  Assessment and Plan: 1. Open dislocation of 2nd PIP on right hand  - See above documentation.   - S/P right index finger reduction and stabilization of PEP index finger dislocation by hand surgery on 01/29/2024.  - Continue pain-control, supportive care   - As per prior documentation: Per Dr. Roselynn; the  patient will remain NWB on the right upper extremity in a clamshell splint. The dressing is to stay clear and dry until follow-up - The patient will follow up with Dr. Janus in the office in 1 week for post-operative follow up.   2. Left knee injury  - Moderate to large suprapatellar effusion with non-displaced fracture of  the posterolateral tibial plaeau with 3 mm of articular surface depression posteriorly. - Plan is for non-operative management with NWB LLE - FU with Dr. Sherida in 2 weeks.   3. Chronic HFrEF  - Appears compensated, has ICD  - Continue torsemide , Aldactone , beta-blocker   - stable   4. CAD  - No anginal symptoms  - Bypass grafts were patent on cath in October 2025  - Continue metoprolol  and Crestor , hold Eliquis  for now     5. Atrial fibrillation, hx of VT  - Hold Eliquis  in anticipation of surgery, continue Toprol , amiodarone , mexiletine, and digoxin    - Will restart eliquis  at discharge.   6. CKD 3A  - Appears close to baseline  - Creatinine 1.63 on admission, now 1.56. - Renally-dose medications     7. Asthma  - Stable - Continue ICS-LABA and as-needed albuterol      8. Depression, anxiety - Continue Celexa    9. Agitation/aggression: - None elicited today. -Psychiatry input is highly appreciated.    DVT prophylaxis: SCDs  Code Status: Full  Level of Care: Level of care: Med-Surg Family Communication:   Disposition Plan:    Consults called: Hand surgery, Orthopedic surgery Admission status: Inpatient.   Subjective: No new complaints.  Physical Exam: Vitals:   01/31/24 1952 02/01/24 0521 02/01/24 0834 02/01/24 0926  BP: 131/80 133/89  120/72  Pulse: 71 81 80 77  Resp: 16 16 16 17   Temp: 98.6 F (37 C) 98.4 F (36.9 C)  98.2 F (36.8 C)  TempSrc: Oral Oral  Oral  SpO2: 95% 94%  92%  Weight:      Height:       Exam:  Constitutional:  The patient is obese, not in any distress.  Patient is awake and alert.   Eyes:  No pallor. Neck:  neck is supple.  JVD difficult to assess.   Respiratory:  Clear to auscultation.   Cardiovascular:  S1-S2.SABRA Abdomen:  Abdomen is obese, soft and nontender.   Musculoskeletal:  Left knee is swollen and painful to touch Right hand is bandaged. Neurologic:  Patient is awake.  Patient moves all extremities.    Psychiatric:  Mental status Mood, affect appropriate Oriented to person, place, time    Data Reviewed:  Family Communication:   Disposition: Status is: Inpatient Remains inpatient appropriate because:   Planned Discharge Destination: Skilled nursing facility    Time spent: 35 minutes  Author: Leatrice LILLETTE Chapel, MD 02/01/2024 12:59 PM  For on call review [email or web address].

## 2024-02-02 DIAGNOSIS — S63289A Dislocation of proximal interphalangeal joint of unspecified finger, initial encounter: Secondary | ICD-10-CM | POA: Diagnosis not present

## 2024-02-02 DIAGNOSIS — S61209A Unspecified open wound of unspecified finger without damage to nail, initial encounter: Secondary | ICD-10-CM | POA: Diagnosis not present

## 2024-02-02 MED ORDER — ONDANSETRON HCL 4 MG/2ML IJ SOLN
4.0000 mg | Freq: Once | INTRAMUSCULAR | Status: AC
  Administered 2024-02-02: 4 mg via INTRAVENOUS
  Filled 2024-02-02: qty 2

## 2024-02-02 NOTE — Progress Notes (Signed)
 Progress Note   Patient: Douglas [name] FMW:[id number] DOB: 01/23/1956 DOA: 01/28/2024     4 DOS: the patient was seen and examined on 02/02/2024   Brief hospital course: Patient is a 68 year old male with past medical history significant for coronary artery disease status post CABG, A-fib on Eliquis , history of VT, HFrEF with ICD and CKD stage III.  Patient presented with injury to the right second finger.  Patient underwent right index finger reduction and stabilization of PEP index finger dislocation by hand surgery on 01/29/2024.    The patient also has an injury to his left knee with pain and swelling CT of the knee was obtained which demonstrated moderate volume lipohemarthrosis with a non-displaced fracture of the posterolateral tibial plateau with approximately 3 mm of articular surface depression posteriorly. There is mild tricompartmental osteoarthritis. Ligaments were suboptimally evaluated by CT. Muscles are within normal limits. Quadriceps and patellar tendons are intact. A Baker's cyst is noted. The knee has been evaluted by orthopedic surgery. They plan for non-operative management with NWG LLE. He will follow up with Dr. Sherida for this in 2 weeks.   Patient is awaiting discharge to SNF.  02/01/2024: Patient seen.  No new complaints.  Awaiting disposition. 02/02/2024: Patient seen.  No new complaints.  Known history of prolonged QTc interval.  EKG ordered yesterday, but not done as yet.  Continue to minimize QTc prolonging medications.  Pursue EKG.  Will reorder EKG today.  Assessment and Plan: 1. Open dislocation of 2nd PIP on right hand  - See above documentation.   - S/P right index finger reduction and stabilization of PEP index finger dislocation by hand surgery on 01/29/2024.  - Continue pain-control, supportive care   - As per prior documentation: Per Dr. Roselynn; the  patient will remain NWB on the right upper extremity in a clamshell splint. The dressing is to stay clear and  dry until follow-up - The patient will follow up with Dr. Janus in the office in 1 week for post-operative follow up.   2. Left knee injury  - Moderate to large suprapatellar effusion with non-displaced fracture of the posterolateral tibial plaeau with 3 mm of articular surface depression posteriorly. - Plan is for non-operative management with NWB LLE - FU with Dr. Sherida in 2 weeks.   3. Chronic HFrEF  - Appears compensated, has ICD  - Continue torsemide , Aldactone , beta-blocker   - stable   4. CAD  - No anginal symptoms  - Bypass grafts were patent on cath in October 2025  - Continue metoprolol  and Crestor , hold Eliquis  for now     5. Atrial fibrillation, hx of VT  - Hold Eliquis  in anticipation of surgery, continue Toprol , amiodarone , mexiletine, and digoxin    - Will restart eliquis  at discharge.   6. CKD 3A  - Appears close to baseline  - Creatinine 1.63 on admission, now 1.56. - Renally-dose medications     7. Asthma  - Stable - Continue ICS-LABA and as-needed albuterol      8. Depression, anxiety - Continue Celexa    9. Agitation/aggression: - None elicited today. -Psychiatry input is highly appreciated.  10.  Nausea: - Likely secondary to opiates. - Reviewed the EKG.  Patient has history of prolonged QTc interval. - Optimize pain control. - Minimize QTc prolonging medication when possible.    DVT prophylaxis: SCDs  Code Status: Full  Level of Care: Level of care: Med-Surg Family Communication:   Disposition Plan:    Consults called: Hand surgery,  Orthopedic surgery Admission status: Inpatient.   Subjective: No new complaints.  Physical Exam: Vitals:   02/02/24 0500 02/02/24 0852 02/02/24 0900 02/02/24 1322  BP:   (!) 138/100 116/89  Pulse:   84 80  Resp:   20 18  Temp:   98.1 F (36.7 C) 98 F (36.7 C)  TempSrc:   Oral   SpO2:  96% 96% 95%  Weight: 110 kg     Height:       Exam:  Constitutional:  The patient is obese, not in any  distress.  Patient is awake and alert.   Eyes:  No pallor. Neck:  neck is supple.  JVD difficult to assess.   Respiratory:  Clear to auscultation.   Cardiovascular:  S1-S2.SABRA Abdomen:  Abdomen is obese, soft and nontender.   Musculoskeletal:  Left knee is swollen and painful to touch Right hand is bandaged. Neurologic:  Patient is awake.  Patient moves all extremities.   Psychiatric:  Mental status Mood, affect appropriate Oriented to person, place, time    Data Reviewed:  Family Communication:   Disposition: Status is: Inpatient Remains inpatient appropriate because:   Planned Discharge Destination: Skilled nursing facility    Time spent: 35 minutes  Author: Leatrice LILLETTE Chapel, MD 02/02/2024 5:15 PM  For on call review [email or web address].

## 2024-02-02 NOTE — Plan of Care (Signed)
  Problem: Education: Goal: Knowledge of General Education information will improve Description: Including pain rating scale, medication(s)/side effects and non-pharmacologic comfort measures Outcome: Progressing   Problem: Clinical Measurements: Goal: Ability to maintain clinical measurements within normal limits will improve Outcome: Progressing Goal: Diagnostic test results will improve Outcome: Progressing   Problem: Activity: Goal: Risk for activity intolerance will decrease Outcome: Progressing   Problem: Nutrition: Goal: Adequate nutrition will be maintained Outcome: Progressing   Problem: Pain Managment: Goal: General experience of comfort will improve and/or be controlled Outcome: Progressing

## 2024-02-03 MED ORDER — SENNA 8.6 MG PO TABS: 1.0000 | ORAL_TABLET | Freq: Every day | ORAL | 0 refills | Status: AC | PRN

## 2024-02-03 MED ORDER — OXYCODONE-ACETAMINOPHEN 10-325 MG PO TABS: 1.0000 | ORAL_TABLET | Freq: Three times a day (TID) | ORAL | 0 refills | Status: AC | PRN

## 2024-02-03 MED ORDER — ALPRAZOLAM 1 MG PO TABS: 1.0000 mg | ORAL_TABLET | Freq: Every evening | ORAL | 0 refills | Status: AC | PRN

## 2024-02-03 NOTE — Progress Notes (Signed)
   KIEREN ADKISON - 68 y.o. male MRN [id number]  Date of birth: 01/16/1956    Subjective:  68 year old male 5 days status post right index finger PIP capsulotomy with irrigation and debridement and reduction with stabilization of the index finger PIP dislocation.  Resting comfortably this morning, hand splint remains in place, dressing is clean dry  Objective:   VITALS:   Vitals:   02/02/24 1322 02/02/24 2022 02/03/24 0400 02/03/24 0500  BP: 116/89 (!) 141/75 122/69   Pulse: 80 73 80   Resp: 18 17 18    Temp: 98 F (36.7 C) 98.2 F (36.8 C) 98.4 F (36.9 C)   TempSrc:   Oral   SpO2: 95% 95%    Weight:    111.5 kg  Height:        Gen: Awake and alert Right upper extremity with dressing in place, remains clean and dry   Lab Results  Component Value Date   WBC 13.2 (H) 01/31/2024   HGB 15.9 01/31/2024   HCT 51.0 01/31/2024   MCV 86.7 01/31/2024   PLT 207 01/31/2024     Assessment/Plan:  5 Days Post-Op right index finger PIP capsulotomy with irrigation and debridement and reduction with stabilization of the index finger PIP dislocation  Awaiting placement for SNF given the nondisplaced tibial plateau fracture of the left lower extremity  Remain nonweightbearing right upper extremity, splint remains in place  Will make arrangements for outpatient follow-up once placement is achieved, can follow-up 1-2 weeks postop in hand surgery office   GILDARDO ALDERTON, MD [place], [facility] 02/03/2024, 8:28 AM

## 2024-02-03 NOTE — TOC Transition Note (Addendum)
 Transition of Care [facility]) - Discharge Note   Patient Details  Name: Douglas [name] MRN: [id number] Date of Birth: [birth year]  [facility] CM/SW Contact:  Lauraine FORBES Saa, LCSWA Phone Number: 02/03/2024, 11:45 AM   Clinical Narrative:     Patient will DC to: [facility] SNF Anticipated DC date: 02/03/2024 Family notified: [name] Muzzy; Neighbor; [phone] Transport by: [facility]   Patient's SNF insurance authorization was approved and is valid  02/02/2024-02/05/2024. Per MD patient ready for DC to [facility] SNF. RN to call report prior to discharge [phone] ext [phone]). RN, patient, patient's friend, and facility notified of DC. Discharge Summary and FL2 sent to facility. DC packet on chart. Ambulance transport requested for patient at 11:34.   CSW will sign off for now as social work intervention is no longer needed. Please consult us  again if new needs arise.    Final next level of care: Skilled Nursing Facility Barriers to Discharge: Barriers Resolved   Patient Goals and CMS Choice Patient states their goals for this hospitalization and ongoing recovery are:: SNF CMS Medicare.gov Compare Post Acute Care list provided to:: Patient Choice offered to / list presented to : Patient      Discharge Placement              Patient chooses bed at: Other - please specify in the comment section below: [facility] SNF) Patient to be transferred to facility by: [facility] Name of family member notified: [name] Muzzy; Neighbor; [phone] Patient and family notified of of transfer: 02/03/24  Discharge Plan and Services Additional resources added to the After Visit Summary for   In-house Referral: Clinical Social Work   Post Acute Care Choice: Skilled Nursing Facility                               Social Drivers of Health (SDOH) Interventions SDOH Screenings   Food Insecurity: Food Insecurity Present (01/29/2024)  Housing: Low  Risk  (01/29/2024)  Transportation Needs: No Transportation Needs (01/29/2024)  Utilities: At Risk (01/29/2024)  Depression (PHQ2-9): High Risk (01/16/2024)  Financial Resource Strain: High Risk (01/16/2024)  Social Connections: Moderately Isolated (01/29/2024)  Tobacco Use: Low Risk  (01/29/2024)  Health Literacy: Inadequate Health Literacy (01/16/2024)     Readmission Risk Interventions    05/31/2023   11:35 AM 05/30/2023   10:20 AM  Readmission Risk Prevention Plan  Transportation Screening Complete Complete  Home Care Screening Complete Complete  Medication Review (RN CM) Complete Complete

## 2024-02-03 NOTE — Care Management Important Message (Signed)
 Important Message  Patient Details  Name: Douglas [name] MRN: [id number] Date of Birth: 1955/07/31   Important Message Given:  Yes - Medicare IM     Claretta Deed 02/03/2024, 4:29 PM

## 2024-02-03 NOTE — Discharge Summary (Signed)
 Physician Discharge Summary  Patient ID: Douglas [name] MRN: [id number] DOB/AGE: Feb 14, 1956 68 y.o.  Admit date: 01/28/2024 Discharge date: 02/03/2024  Admission Diagnoses:  Discharge Diagnoses:  Principal Problem:   Open dislocation of proximal interphalangeal (PIP) joint of finger of right hand Active Problems:   Essential hypertension   Atrial fibrillation, chronic (HCC)   Generalized anxiety disorder   Coronary artery disease   Chronic systolic CHF (congestive heart failure) (HCC)   Mild recurrent major depression   Moderate persistent asthma without complication   Stage 3a chronic kidney disease (HCC)   Left knee injury   Discharged Condition: stable  Hospital Course:  Patient is a 68 year old male with past medical history significant for coronary artery disease status post CABG, A-fib on Eliquis , history of VT, HFrEF with ICD and CKD stage III.  Patient presented with injury to the right second finger.  Patient underwent right index finger reduction and stabilization of PEP index finger dislocation by hand surgery on 01/29/2024.  The patient also had an injury to his left knee with pain and swelling.  CT of the knee was obtained demonstrated moderate volume lipohemarthrosis with a non-displaced fracture of the posterolateral tibial plateau with approximately 3 mm of articular surface depression posteriorly. There was mild tricompartmental osteoarthritis. Ligaments were suboptimally evaluated by CT.  Muscles were within normal limits. Quadriceps and patellar tendons were intact. A Baker's cyst was noted. The knee was evaluted by orthopedic surgery. They plan is for non-operative management with NWG LLE.  Patient will follow up with Dr. Sherida, orthopedic surgeon, and Dr. Gildardo Cahill, hand surgeon, within 1 to 2 weeks of discharge.  Patient has been optimized.  Patient will be discharged to a skilled nursing facility.  1. Open dislocation of 2nd PIP on right finger:    - S/P right  index finger reduction and stabilization of PEP index finger dislocation by hand surgery on 01/29/2024.  - Continue supportive care.  - Patient will follow-up with hand surgeon, and show Agarwala, MD, in 2 weeks time. -Patient will remain NWB on the right upper extremity in a clamshell splint. The dressing is to stay clear and dry until follow-up - The patient will follow up with Dr. Janus in the office in 1-2 week for post-operative care.     2. Left knee injury  - Moderate to large suprapatellar effusion with non-displaced fracture of the posterolateral tibial plaeau with 3 mm of articular surface depression posteriorly. - Plan is for non-operative management with NWB LLE - Follow-up with Dr. Sherida, orthopedic surgeon, in 2 weeks time.     3. Chronic HFrEF  - Appears compensated, has ICD  - Continue torsemide , Aldactone , beta-blocker   - stable -Jardiance  is currently on hold. - Follow-up with cardiology team on discharge.   4. CAD  - No anginal symptoms  - Bypass grafts were patent on cath in October 2025  - Continue metoprolol  and Crestor .   5. Atrial fibrillation, hx of VT  - Resume Eliquis . - Continue Toprol , amiodarone , mexiletine, and digoxin      6. CKD 3A  - Appears close to baseline  - Creatinine 1.63 on admission, now 1.56. - Renally-dose medications   -Follow-up with nephrology team (PCP to make referral)   7. Asthma  - Stable - Continue ICS-LABA and as-needed albuterol      8. Depression, anxiety - Continue Celexa     9. Agitation/aggression: - Resolved. -Psychiatry input is highly appreciated.   10.  Nausea: - Likely secondary to opiates. -  Reviewed the EKG.  Patient has history of prolonged QTc interval. - Optimize pain control. - Minimize QTc prolonging medication when possible.      Consults: psychiatry, orthopedic surgery, and hand surgery team  Significant Diagnostic Studies:  X-ray of the right hand, complete 3+ view revealed: Dorsal  dislocation of the PIP joint of the second right finger.   CT cervical spine without contrast revealed: 1. No acute fracture or traumatic subluxation. 2. Marked severity multilevel degenerative changes  CT chest, abdomen and pelvis with contrast revealed: 1. No acute posttraumatic sequelae in the chest, abdomen or pelvis. 2. Mild cardiomegaly. 3. Sigmoid colon diverticulosis. 4. Chronic compression deformities of L1 and L2. 5. Aortic atherosclerosis.  CT head without contrast revealed: 1. No acute intracranial abnormality. 2. Generalized cerebral atrophy and microvascular disease changes of the supratentorial brain. 3. Chronic deformity of the right globe.  CT of the left knee without contrast revealed: 1. Nondisplaced fracture of the posterolateral tibial plateau with approximately 3 mm of articular surface depression posteriorly. 2. Moderate volume lipohemarthrosis. 3. Mild tricompartmental osteoarthritis.      Treatments:  Right index finger PIP capsulotomy with irrigation and debridement Right index finger proximal interphalangeal joint dislocation open reduction and stabilization with pinning  Discharge Exam: Blood pressure 134/81, pulse 82, temperature 97.7 F (36.5 C), temperature source Oral, resp. rate 18, height 5' 9 (1.753 m), weight 111.5 kg, SpO2 96%.   Disposition: Discharge disposition: 03-Skilled Nursing Facility       Discharge Instructions     Diet - low sodium heart healthy   Complete by: As directed    Discharge wound care:   Complete by: As directed    Continue current wound care dressing plan.   Increase activity slowly   Complete by: As directed       Allergies as of 02/03/2024       Reactions   Xarelto  [rivaroxaban ] Other (See Comments)   Bleeding ulcers   Codeine Hives, Itching   Lasix  [furosemide ] Other (See Comments)   Patient says he gets light headed and dizzy.    Vioxx [rofecoxib] Palpitations, Other (See Comments)   Caused  massive heart attack        Medication List     STOP taking these medications    ARTIFICIAL TEARS OP   doxycycline  100 MG tablet Commonly known as: VIBRA -TABS   ezetimibe  10 MG tablet Commonly known as: ZETIA    Fish Oil 1200 MG Caps   fluticasone  50 MCG/ACT nasal spray Commonly known as: FLONASE    Jardiance  10 MG Tabs tablet Generic drug: empagliflozin    potassium chloride  SA 20 MEQ tablet Commonly known as: KLOR-CON  M   tiZANidine  4 MG tablet Commonly known as: ZANAFLEX        TAKE these medications    albuterol  108 (90 Base) MCG/ACT inhaler Commonly known as: VENTOLIN  HFA Inhale 2 puffs into the lungs every 6 (six) hours as needed for wheezing or shortness of breath.   ALPRAZolam  1 MG tablet Commonly known as: XANAX  Take 1 tablet (1 mg total) by mouth at bedtime as needed for up to 5 days for anxiety.   amiodarone  200 MG tablet Commonly known as: PACERONE  Take 1 tablet (200 mg total) by mouth 2 (two) times daily.   citalopram  20 MG tablet Commonly known as: CELEXA  Take 20 mg by mouth daily.   Digoxin  62.5 MCG Tabs Take 1 tablet (0.0625mg  total) by mouth daily.   Eliquis  5 MG Tabs tablet Generic drug: apixaban  TAKE ONE  TABLET BY MOUTH TWICE DAILY   fluticasone -salmeterol 250-50 MCG/ACT Aepb Commonly known as: ADVAIR Inhale 1 puff into the lungs in the morning and at bedtime. What changed: when to take this   lansoprazole 30 MG capsule Commonly known as: PREVACID Take 30 mg by mouth daily.   losartan  25 MG tablet Commonly known as: COZAAR  Take 0.5 tablets (12.5 mg total) by mouth daily.   metoprolol  succinate 25 MG 24 hr tablet Commonly known as: TOPROL -XL Take 1 tablet (25 mg total) by mouth daily.   mexiletine 150 MG capsule Commonly known as: MEXITIL  Take 1 capsule (150 mg total) by mouth every 12 (twelve) hours.   oxyCODONE -acetaminophen  10-325 MG tablet Commonly known as: PERCOCET Take 1 tablet by mouth every 8 (eight) hours as  needed for up to 8 days.   rosuvastatin  40 MG tablet Commonly known as: CRESTOR  TAKE ONE (1) TABLET EACH DAY   senna 8.6 MG Tabs tablet Commonly known as: SENOKOT Take 1 tablet (8.6 mg total) by mouth daily as needed for mild constipation or moderate constipation.   spironolactone  25 MG tablet Commonly known as: ALDACTONE  Take 1 tablet (25 mg total) by mouth daily.   tamsulosin  0.4 MG Caps capsule Commonly known as: FLOMAX  Take 1 capsule (0.4 mg total) by mouth daily.   torsemide  20 MG tablet Commonly known as: DEMADEX  Take 3 tablets (60 mg total) by mouth daily.               Discharge Care Instructions  (From admission, onward)           Start     Ordered   02/03/24 0000  Discharge wound care:       Comments: Continue current wound care dressing plan.   02/03/24 1124            Contact information for follow-up providers     Agarwala, Anshul, MD Follow up in 1 week(s).   Specialty: Orthopedic Surgery Why: For wound re-check Contact information: [address] [place]  [place] [address] [phone]              Contact information for after-discharge care     Destination     [facility]   Service: Skilled Nursing Contact information: [address] [facility] [place]  [address] [phone]                    Time spent: 35 minutes.  SignedBETHA Leatrice LILLETTE Rosario 02/03/2024, 11:24 AM

## 2024-02-03 NOTE — Progress Notes (Signed)
 Report to [place] from  [facility] [phone]). Douglas [name]

## 2024-02-03 NOTE — Care Management Important Message (Signed)
 Important Message  Patient Details  Name: Douglas [name] MRN: [id number] Date of Birth: 03-14-1955   Important Message Given:  Yes - Medicare IM     Claretta Deed 02/03/2024, 1:42 PM

## 2024-02-03 NOTE — Nursing Note (Signed)
 The resident arrived at the facility via EMS at 1435. He was alert and verbal with a pleasant mood. A splint and dressing were in place on the right upper extremity (RUE). The right radial pulse was unable to be assessed due to the splint and dressing. He was able to move the digits of the right hand. Capillary refill was less than 3 seconds. The dressing is to remain in place until follow-up with the surgeon in one week. Respirations were even and unlabored. He has a history of trauma to the right eye; the right eye remains closed and can only be opened slightly. No pupil was noted, and damage to the iris was observed. 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 The left pupil was reactive to light. He reports a cataract in the left eye but was able to sign admission forms and feed himself independently. An ICD was noted to chest. There was an old scar on the sternum and medial right leg. History is significant for coronary artery bypass grafting (CABG). Edema was noted in the left knee. The resident was educated on orders for non-weight bearing (NWB) status to the right upper extremity and left lower extremity and verbalized understanding. He is up to date on influenza and pneumonia immunizations and requested RSV and COVID-19 vaccinations; orders were placed in Epic. Transport staff were informed of the required follow-up appointments. The attending physician was notified of the resident's arrival. The resident complained of pain during assessment; nursing staff were notified for pain management. He was oriented to the call light and television remote. No signs or symptoms of acute distress were observed.

## 2024-02-04 ENCOUNTER — Telehealth: Payer: Self-pay

## 2024-02-04 NOTE — Telephone Encounter (Signed)
 LMOM on SNF's transportation line to c/b to schedule appointment for 12/15

## 2024-02-05 ENCOUNTER — Ambulatory Visit: Attending: Internal Medicine | Admitting: Internal Medicine

## 2024-02-05 ENCOUNTER — Encounter: Payer: Self-pay | Admitting: Internal Medicine

## 2024-02-05 VITALS — BP 106/64 | HR 69 | Ht 69.0 in | Wt 240.0 lb

## 2024-02-05 DIAGNOSIS — I480 Paroxysmal atrial fibrillation: Secondary | ICD-10-CM

## 2024-02-05 DIAGNOSIS — Z95810 Presence of automatic (implantable) cardiac defibrillator: Secondary | ICD-10-CM

## 2024-02-05 DIAGNOSIS — I5022 Chronic systolic (congestive) heart failure: Secondary | ICD-10-CM | POA: Diagnosis not present

## 2024-02-05 DIAGNOSIS — I255 Ischemic cardiomyopathy: Secondary | ICD-10-CM

## 2024-02-05 DIAGNOSIS — I472 Ventricular tachycardia, unspecified: Secondary | ICD-10-CM

## 2024-02-05 DIAGNOSIS — I2510 Atherosclerotic heart disease of native coronary artery without angina pectoris: Secondary | ICD-10-CM

## 2024-02-05 LAB — CUP PACEART INCLINIC DEVICE CHECK
Battery Remaining Longevity: 63 mo
Battery Voltage: 2.92 V
Brady Statistic AP VP Percent: 0 %
Brady Statistic AP VS Percent: 0 %
Brady Statistic AS VP Percent: 10.14 %
Brady Statistic AS VS Percent: 89.86 %
Brady Statistic RA Percent Paced: 0 %
Brady Statistic RV Percent Paced: 10.34 %
Date Time Interrogation Session: 20251210122718
HighPow Impedance: 96 Ohm
Implantable Lead Connection Status: 753985
Implantable Lead Connection Status: 753985
Implantable Lead Implant Date: 20060116
Implantable Lead Implant Date: 20130715
Implantable Lead Location: 753859
Implantable Lead Location: 753860
Implantable Lead Model: 5076
Implantable Lead Model: 6935
Implantable Pulse Generator Implant Date: 20200804
Lead Channel Impedance Value: 1805 Ohm
Lead Channel Impedance Value: 494 Ohm
Lead Channel Impedance Value: 589 Ohm
Lead Channel Pacing Threshold Amplitude: 0.5 V
Lead Channel Pacing Threshold Pulse Width: 0.4 ms
Lead Channel Sensing Intrinsic Amplitude: 31.625 mV
Lead Channel Setting Pacing Amplitude: 2 V
Lead Channel Setting Pacing Pulse Width: 0.4 ms
Lead Channel Setting Sensing Sensitivity: 0.3 mV
Zone Setting Status: 755011
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
## 2024-02-05 NOTE — Progress Notes (Signed)
° °  Douglas [name] - 68 y.o. male MRN [id number]  Date of birth: 05-23-1955  Office Visit Note: Visit Date: 02/06/2024 PCP: Mancil Pfeiffer, PA-C Referred by: [place], [place], [place]  Subjective:  HPI: RONNY KORFF is a 68 y.o. male who presents today for follow up 1 week status post  right index finger PIP capsulotomy with irrigation and debridement. 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  Doing well overall, pain is controlled.  Pertinent ROS were reviewed with the patient and found to be negative unless otherwise specified above in HPI.   Assessment & Plan: Visit Diagnoses:  1. Right hand pain     Plan: X-rays obtained today show stable appearance of the right index finger PIP with appropriate pin site fixation.  He can be transition today to a removable splint.  Can remove for hygienic purposes only.  Follow-up in 1 week for suture removal.  Follow-up: No follow-ups on file.   Meds & Orders: No orders of the defined types were placed in this encounter.   Orders Placed This Encounter  Procedures   XR Finger Index Right   Ambulatory referral to Occupational Therapy     Procedures: No procedures performed       Objective:   Vital Signs: There were no vitals taken for this visit.  Ortho Exam Right index finger with well-healing volar laceration site, sutures in place, skin edges well-approximated without erythema or drainage, pin site remains clean and dry, PIP well located, no rotational abnormality, digit warm well-perfused  Imaging: XR Finger Index Right Result Date: 02/06/2024 X-ray of the right index finger demonstrate stable appearance of the PIP joint status post reduction and pinning, pin remains well fixated in all planes without evidence of failure or migration  CUP PACEART INCLINIC DEVICE CHECK Result Date: 02/05/2024 Normal in-clinic dual chamber ICD check (programmed VVI). Presenting  Rhythm: AF/VS 50s-80s. Routine testing was performed. Threshold, sensing, and impedance demonstrate stable parameters. VT Rx 5 and 6 pathways reprogrammed to B>AX* per device recommendation. No VT/VF episodes. Chronic AF, on Eliquis , mexiletine, and amiodarone . Estimated longevity 5.3 years. Pt enrolled in remote follow-up.Damien Maid, BSN, RN, CCDS    Cattleya Dobratz Estela) Arlinda, M.D. [facility], Hand Surgery

## 2024-02-05 NOTE — Patient Instructions (Signed)
 Medication Instructions:  Your physician recommends that you continue on your current medications as directed. Please refer to the Current Medication list given to you today.  *If you need a refill on your cardiac medications before your next appointment, please call your pharmacy*  Lab Work: None ordered.  You may go to any [facility] Location for your lab work:  [facility] - [address] ([facility]) - [phone] [address]   - [address]   [place]  - [address]   [place] - [address] - [address] [employer]  - [address] [place] - [address] ([facility]   If you have labs (blood work) drawn today and your tests are completely normal, you will receive your results only by: [employer] (if you have MyChart)  If you have any lab test that is abnormal or we need to change your treatment, we will call you or send a MyChart message to review the results.  Testing/Procedures: None ordered.  Follow-Up: At [facility], you and your health needs are our priority.  As part of our continuing mission to provide you with exceptional heart care, we have created designated Provider Care Teams.  These Care Teams include your primary Cardiologist (physician) and Advanced Practice Providers (APPs -  Physician Assistants and Nurse Practitioners) who all work together to provide you with the care you need, when you need it.  Your next appointment:   June 2026  The format for your next appointment:   In Person  Provider:   Donnice Primus, MD or one of the following Advanced Practice Providers on your designated Care Team:   Charlies Arthur, [place] Ozell Jodie Passey, [place] Leotis Barrack, NP  Note: Remote monitoring is used to monitor your Pacemaker/ ICD from home. This monitoring reduces the number of office visits required to check  your device to one time per year. It allows us  to keep an eye on the functioning of your device to ensure it is working properly.

## 2024-02-06 ENCOUNTER — Ambulatory Visit: Admitting: Rehabilitative and Restorative Service Providers"

## 2024-02-06 ENCOUNTER — Ambulatory Visit: Admitting: Orthopedic Surgery

## 2024-02-06 ENCOUNTER — Ambulatory Visit

## 2024-02-06 ENCOUNTER — Encounter: Payer: Self-pay | Admitting: Rehabilitative and Restorative Service Providers"

## 2024-02-06 DIAGNOSIS — M79641 Pain in right hand: Secondary | ICD-10-CM

## 2024-02-06 DIAGNOSIS — M6281 Muscle weakness (generalized): Secondary | ICD-10-CM

## 2024-02-06 DIAGNOSIS — R600 Localized edema: Secondary | ICD-10-CM | POA: Diagnosis not present

## 2024-02-06 DIAGNOSIS — R278 Other lack of coordination: Secondary | ICD-10-CM

## 2024-02-06 DIAGNOSIS — M25641 Stiffness of right hand, not elsewhere classified: Secondary | ICD-10-CM | POA: Diagnosis not present

## 2024-02-06 NOTE — Therapy (Signed)
 OUTPATIENT OCCUPATIONAL THERAPY ORTHO EVALUATION  Patient Name: Douglas [name] MRN: [id number] DOB:1955/12/09, 68 y.o., male Today's Date: 02/06/2024  PCP: Charmaine Pontiff, PA-C REFERRING PROVIDER: Dr. Arlinda   END OF SESSION:  OT End of Session - 02/06/24 1024     Visit Number 1    Number of Visits 1    Authorization Type [employer] Dual Complete    OT Start Time 1025    OT Stop Time 1059    OT Time Calculation (min) 34 min    Equipment Utilized During Treatment orthotic materials    Activity Tolerance Patient tolerated treatment well;Patient limited by pain;No increased pain;Patient limited by fatigue;Patient limited by lethargy    Behavior During Therapy [facility] for tasks assessed/performed          Past Medical History:  Diagnosis Date   A-fib (HCC)    AICD (automatic cardioverter/defibrillator) present    [employer]   Anemia    Asthma    CAD (coronary artery disease)    CHF (congestive heart failure) (HCC)    Diabetes mellitus without complication (HCC)    pt denies   Dyslipidemia    Elevated cholesterol    Hypertension    Past Surgical History:  Procedure Laterality Date   CARDIAC CATHETERIZATION  1996   stent placement   CARDIAC DEFIBRILLATOR PLACEMENT     CLOSED REDUCTION FINGER WITH PERCUTANEOUS PINNING Right 01/29/2024   Procedure: CLOSED REDUCTION, FINGER, WITH PERCUTANEOUS PINNING;  Surgeon: Arlinda Buster, MD;  Location: [facility] OR;  Service: Orthopedics;  Laterality: Right;  PINNING RIGHT INDEX FINGER   CORONARY ARTERY BYPASS GRAFT  2002   times 4   CORONARY ARTERY BYPASS GRAFT     EYE SURGERY     ICD GENERATOR CHANGEOUT N/A 09/30/2018   Procedure: ICD GENERATOR CHANGEOUT;  Surgeon: Waddell Danelle ORN, MD;  Location: [facility] INVASIVE CV LAB;  Service: Cardiovascular;  Laterality: N/A;   IMPLANTABLE CARDIOVERTER DEFIBRILLATOR (ICD) GENERATOR CHANGE N/A 09/10/2011   Procedure: ICD GENERATOR CHANGE;  Surgeon: Danelle ORN Waddell, MD;  Location: [facility] CATH LAB;  Service:  Cardiovascular;  Laterality: N/A;   INSERT / REPLACE / REMOVE PACEMAKER     LEFT HEART CATHETERIZATION WITH CORONARY ANGIOGRAM N/A 08/29/2011   Procedure: LEFT HEART CATHETERIZATION WITH CORONARY ANGIOGRAM;  Surgeon: Peter M Jordan, MD;  Location: [facility] CATH LAB;  Service: Cardiovascular;  Laterality: N/A;   OPEN REDUCTION INTERNAL FIXATION (ORIF) FINGER WITH RADIAL BONE GRAFT Right 01/29/2024   Procedure: OPEN REDUCTION INTERNAL FIXATION (ORIF) FINGER WITH RADIAL BONE GRAFT;  Surgeon: Arlinda Buster, MD;  Location: [facility] OR;  Service: Orthopedics;  Laterality: Right;  ORIF RIGHT INDEX FINGER PIP   RIGHT/LEFT HEART CATH AND CORONARY/GRAFT ANGIOGRAPHY N/A 12/18/2023   Procedure: RIGHT/LEFT HEART CATH AND CORONARY/GRAFT ANGIOGRAPHY;  Surgeon: Cherrie Toribio SAUNDERS, MD;  Location: [facility] INVASIVE CV LAB;  Service: Cardiovascular;  Laterality: N/A;   Patient Active Problem List   Diagnosis Date Noted   Open dislocation of proximal interphalangeal (PIP) joint of finger of right hand 01/29/2024   Left knee injury 01/29/2024   PVC's (premature ventricular contractions) 01/20/2024   Venous stasis ulcers of both lower extremities (HCC) 01/14/2024   Epistaxis 01/14/2024   Acute hypoxemic respiratory failure (HCC) 12/12/2023   Ventricular tachycardia (HCC) 12/12/2023   Frequent falls 11/14/2023   Acquired thrombophilia 05/28/2023   Prolonged QT interval 04/23/2023   Chronic systolic CHF (congestive heart failure) (HCC) 04/23/2023   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 06/09/2020  Moderate persistent asthma without complication 06/09/2020   Stage 3a chronic kidney disease (HCC) 06/09/2019   Biventricular failure (HCC) 06/10/2017   Hyperosmolarity syndrome 06/10/2017   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   Coronary artery disease 06/02/2017   Diverticulosis 06/02/2017   Vertebral compression fracture (HCC) 06/02/2017   Spondylosis of cervical spine 06/02/2017   Coagulopathy  06/02/2017   Allergic rhinitis 12/27/2014   Chronic low back pain 12/27/2014   Gastroesophageal reflux disease without esophagitis 12/27/2014   Generalized anxiety disorder 12/27/2014   Hyperlipidemia 12/27/2014   DJD (degenerative joint disease), lumbosacral 08/26/2013   Atrial fibrillation, chronic (HCC) 08/25/2013   History of Substance abuse 08/25/2013   Essential hypertension 05/01/2010   Cardiomyopathy, ischemic 05/01/2010   ICD (implantable cardioverter-defibrillator) in place 05/01/2010    ONSET DATE: DOS 01/29/24  REFERRING DIAG: F20.358 (ICD-10-CM) - Right hand pain   THERAPY DIAG:  Pain in right hand - Plan: Ot plan of care cert/re-cert  Localized edema - Plan: Ot plan of care cert/re-cert  Other lack of coordination - Plan: Ot plan of care cert/re-cert  Muscle weakness (generalized) - Plan: Ot plan of care cert/re-cert  Stiffness of right hand, not elsewhere classified - Plan: Ot plan of care cert/re-cert  Rationale for Evaluation and Treatment: Rehabilitation  SUBJECTIVE:   SUBJECTIVE STATEMENT: Now 1 week s/p Rt IF PIP capsulotomy, PIP dorsal dislocation and pinning.  He states he is in high resting pain as he sits in his wheelchair. He states he fell and hurt his knee as well.  When his finger dislocated, it bluntly pierced the volar surface of his skin, and there are sutures present there, and pin present in the finger.  He does currently get help from therapists at his rehab facility, so today's visit is set up just to make him an orthosis and give him safety recommendations.  He would discharge after today's visit with me.   PERTINENT HISTORY: 1 week status post  right index finger PIP capsulotomy with irrigation and debridement. Right index finger proximal interphalangeal joint dislocation open reduction and stabilization with pinning   PRECAUTIONS: Knee, Fall, and ICD/Pacemaker  RED FLAGS: None   WEIGHT BEARING RESTRICTIONS: Yes NWB in Rt hand now    PAIN:  Are you having pain? Yes: NPRS scale: 9/10 at rest  Pain location: Rt hand IF  Pain description: aching Aggravating factors: motion Relieving factors: rest  FALLS: Has patient fallen in last 6 months? Yes. Number of falls 1 this injury considered high fall risk   LIVING ENVIRONMENT: Lives with: normally lives at home, but is staying at a short term skilled facility for rehab now and likely for several more weeks.  Has following equipment at home: He is in a manual wheelchair today  PLOF: Independent with basic ADLs  PATIENT GOALS: To get a supportive brace to protect his pinning and surgery site in the right hand  NEXT MD VISIT: Next week for suture removal   OBJECTIVE: (All objective assessments below are from initial evaluation on: 02/06/2024 unless otherwise specified.)   HAND DOMINANCE: Right   ADLs: Overall ADLs: States decreased ability to grab, hold household objects, pain and difficulty to open containers, perform FMS tasks (manipulate fasteners on clothing), moderate-max bathing problems and BADLs now as well.   UPPER EXTREMITY ROM     Shoulder to Wrist AROM Right eval  Wrist flexion Apprx 45*  Wrist extension Apprx 35*  (Blank rows = not tested)   Hand AROM Right eval  Full  Fist Ability (or Gap to Distal Palmar Crease) Unable due to pinned IF and unsafe to test in Rt hand now, hand generally swollen and stiff as well  Thumb Opposition  (Kapandji Scale)  Unable to oppose to IF  (Blank rows = not tested)   UPPER EXTREMITY MMT:    Eval:  NT at eval due to recent and still healing injuries.   HAND FUNCTION: Eval: Observed weakness in affected Rt hand.  Unsafe to test details now  COORDINATION: Eval: Observed coordination impairments with affected right hand.  Unsafe to test details now  SENSATION: Eval:  Light touch intact today, though diminished around sx area    EDEMA:   Eval: Moderately swollen in right hand and wrist  today  COGNITION: Eval: Overall cognitive status: WFL for evaluation today   OBSERVATIONS:   Eval: Hand generally swollen and stiff, pin site does look clean for now as well as suture site.  No signs of dehiscence or infection.   TODAY'S TREATMENT:  Post-evaluation treatment:   Custom orthotic fabrication was indicated due to pt's healing right hand index finger injury and percutaneous pinning and need for safe, functional positioning. OT fabricated custom hand-based SAFE position orthosis for pt today to immobilize the fingers and protect the pin and surgery sites. It fit well with no areas of pressure, pt states a comfortable fit. Pt was educated on the wearing schedule (on at all times except for hygiene, and wound care for now), to avoid exposing it to sources of heat, to wipe clean as needed (do not wash, use harsh detergents), to call or come in ASAP if it is causing any irritation or is not achieving desired function. Pt states understanding all directions.      He was given printed directions with the following information.  This includes wound care management, safety precautions, expectations for future physician visits, expectations for active range of motion and gentle exercise at all uninvolved joints:  Wear the brace all day and night.  Ok to remove to check and clean wounds 2-3x day as needed.    No weight bearing!  No grabbing anything or leaning on your right hand.    The pin will stay in for 4-6 weeks.  Your stitches will come out in 1 week with Dr. Erwin.   You will continue therapy at your facility, and are encouraged to move your wrist, forearm, elbow, and shoulder- but no significant exercises to your hand yet.  In 3-4 weeks you may take off the brace to star moving your hand and index finger gently, if the doctor allows.   Try to control swelling with compression garments, and elevation.   PATIENT EDUCATION: Education details: See tx section above for details   Person educated: Patient Education method: Verbal Instruction, Teach back, Handouts  Education comprehension: States and demonstrates understanding   HOME EXERCISE PROGRAM: See tx section above for details    GOALS: Goals reviewed with patient? Yes   SHORT TERM GOALS: (STG required if POC>30 days) Target Date: 02/06/2024  Pt will obtain protective, custom orthotic. Goal status: MET   2.  Pt will demo/state understanding of initial safety precautions and gentle exercises at uninvolved joints to improve pain levels and prerequisite motion. Goal status: MET    ASSESSMENT:  CLINICAL IMPRESSION: Patient is a 68 y.o. male who was seen today for occupational therapy evaluation for stiffness, swelling, pain, weakness and safety concerns with the right hand after extreme dislocation, skin tearing, percutaneous pinning of  the right hand index finger.  The patient required a safety orthosis and safety information today, but will discharge to outpatient occupational therapy at this time-as he is receiving OT and PT in a rehab facility.  He should follow-up with a hand surgeon next week to have sutures removed and for all future needs.   PERFORMANCE DEFICITS: in functional skills including ADLs, IADLs, coordination, dexterity, edema, ROM, strength, pain, body mechanics, endurance, decreased knowledge of precautions, wound, and UE functional use, cognitive skills including problem solving and safety awareness, and psychosocial skills including coping strategies, environmental adaptation, habits, and routines and behaviors.   IMPAIRMENTS: are limiting patient from ADLs, IADLs, rest and sleep, and leisure.   COMORBIDITIES: has co-morbidities such as heart disease with cardiac implant, asthma, CHF, diabetes, hypertension, mobility issues, lower body pain and problems, and others that affects occupational performance. Patient will benefit from skilled OT to address above impairments and improve  overall function.  MODIFICATION OR ASSISTANCE TO COMPLETE EVALUATION: No modification of tasks or assist necessary to complete an evaluation.  OT OCCUPATIONAL PROFILE AND HISTORY: Detailed assessment: Review of records and additional review of physical, cognitive, psychosocial history related to current functional performance.  CLINICAL DECISION MAKING: LOW - limited treatment options, no task modification necessary  REHAB POTENTIAL: Fair  EVALUATION COMPLEXITY: Low      PLAN:  OT FREQUENCY& OT DURATION: 1 visit today for safety information and orthotic fabrication, 02/06/2024  PLANNED INTERVENTIONS: 97535 self care/ADL training, 02889 therapeutic exercise, 97530 therapeutic activity, 97112 neuromuscular re-education, 97140 manual therapy, 97035 ultrasound, [id number] electrical stimulation (manual), [id number] Orthotic Initial, [id number] Orthotic/Prosthetic subsequent, compression bandaging, Dry needling, energy conservation, coping strategies training, and patient/family education  RECOMMENDED OTHER SERVICES: He should return to supportive rehabilitation and his rehab facility with OT and PT and other disciplines as necessary.  CONSULTED AND AGREED WITH PLAN OF CARE: Patient  PLAN FOR NEXT SESSION:   N/A/discharge   Melvenia Ada, OTR/L, CHT  02/06/2024, 2:52 PM

## 2024-02-06 NOTE — Care Plan (Signed)
 [facility] [place] [address] [place] [address] [phone]  Physician Medicare Certification Post-Hospital SNF Care  Patient Name: Douglas [name] MRN: [id number] Medicare Number: [id number] - (Medicare Advantage) Attending Provider: Orpha Yancey Skeens, MD Admitting Provider: Yancey Skeens Orpha, MD Admission Date: 02/03/2024  I certify that post-hospital SNF care is required at a skilled level on a daily basis on behalf of the above named patient that, as a practical matter, can only be provided in a SNF.  The SNF care is needed for a condition for which the individual received inpatient care in a participating hospital.  Additional justification (if applicable): Admitted to facility following hospital stay for Surgery of the musculoskeletal system. Skilled Rehab: yes. If yes, as documented per rehab plan of care.  Provider's Name: Yancey Orpha, MD Date: 02/06/2024 12:02 PM

## 2024-02-11 NOTE — Nursing Note (Signed)
 Patient is alert and oriented with the ability to make needs known, has no complaint of pain or sob, did request to have PRN XANAX  1 mg at 2349 to help me sleep. Patient is now in bed watching TV with call light and hydration within reach. Is to continue therapy as ordered by MD.

## 2024-02-11 NOTE — Progress Notes (Unsigned)
° °  Douglas [name] - 68 y.o. male MRN [id number]  Date of birth: 10-07-1955  Office Visit Note: Visit Date: 02/12/2024 PCP: Mancil Pfeiffer, PA-C Referred by: Grooms, [place], [place]  Subjective:  HPI: Douglas [name] is a 68 y.o. male who presents today for follow up 2 weeks status post right index finger PIP capsulotomy with irrigation and debridement. 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  Pertinent ROS were reviewed with the patient and found to be negative unless otherwise specified above in HPI.   Assessment & Plan: Visit Diagnoses: No diagnosis found.  Plan: ***  Follow-up: No follow-ups on file.   Meds & Orders: No orders of the defined types were placed in this encounter.  No orders of the defined types were placed in this encounter.    Procedures: No procedures performed       Objective:   Vital Signs: There were no vitals taken for this visit.  Ortho Exam ***  Imaging: No results found.   Rupert Azzara Afton Alderton, M.D. [facility], Hand Surgery

## 2024-02-11 NOTE — LTC Provider Review (Signed)
°   SPEECH THERAPY TREATMENT NOTE    Patient Name:  Douglas [name]      Medical Record Number: [id number]  Date of Birth: Nov 24, 1955 Location: [facility] [place]   PT Precautions: Falls,   OT Precautions: Falls,   SLP Precautions: Falls,    Subjective: Pt was engaged and cooperative with skilled ST intervention.    Cognitive Communication Treatment: Pt was upright in chair at start of session. Pt was instructed to complete simple fxnl ADL tasks targeting sequencing and problem solving achieving 100% accuracy. Pt was instructed to complete sequential thought tasks achieving 66% accuracy requiring mod verbal cues.   I attest that I have reviewed the above information. SignedBETHA Ulanda Moats, SLP 02/11/2024

## 2024-02-12 ENCOUNTER — Telehealth: Payer: Self-pay | Admitting: Orthopedic Surgery

## 2024-02-12 ENCOUNTER — Other Ambulatory Visit (INDEPENDENT_AMBULATORY_CARE_PROVIDER_SITE_OTHER)

## 2024-02-12 ENCOUNTER — Ambulatory Visit: Admitting: Orthopedic Surgery

## 2024-02-12 DIAGNOSIS — M79641 Pain in right hand: Secondary | ICD-10-CM

## 2024-02-12 NOTE — Telephone Encounter (Signed)
 Waddell from [facility] in [place] called saying that the pt didn't return with any information about his visit today and they would like to have that because he saying that he can't move his leg and they need t faxo know the wound care process and threaphy instructions. Fax number is [phone]. Call back number is [phone].

## 2024-02-13 ENCOUNTER — Ambulatory Visit: Admitting: Physician Assistant

## 2024-02-13 NOTE — Telephone Encounter (Signed)
 Attempted to call number provided. No option to leave VM as it just rang. If they call back, We did not treat his leg, so unsure about the leg comment. From the hand, continue to stay in splint until follow up

## 2024-02-24 NOTE — LTC Provider Review (Signed)
------------------------------------------------------------------------------- °  Summary: Physical Therapy Treatment note -------------------------------------------------------------------------------  PHYSICAL THERAPY TREATMENT NOTE      Patient Name:  Douglas [name]      Medical Record Number: [id number]  Date of Birth: [birth year] Location: [facility] [place]        PT Precautions: Falls,   OT Precautions: Falls,   SLP Precautions: Falls,    Weight Bearing: RUE Weight Bearing: Non weight bearing   LUE Weight Bearing: No Restrictions   RLE Weight Bearing: No Restrictions   LLE Weight Bearing: Non weight bearing    SUBJECTIVE Patient reports he feels like he is getting stronger. He states he does not have pain in the LLE today.   OBJECTIVE Today's Treatment:   Exercises: Supine-Exercise Type: Quad sets, Ankle pumps, Glut sets, ABduction/ADduction, Heel slides, Straight leg raising, Bridging without bolster, Lower trunk rotation Supine-Exercise Comments: Patient educated on supine exercises and completed each leg 3 x 12 reps each with increased mobility of LLE. Patient verbalized understanding. Sit to stands x 3 reps with mod A in NWB on LLE and NWB on RUE. Difficutly due to decreased strength. Seated-Exercise Comments: Ther-Ex seated EOB to BLE's with 4# ankle weight on RLE only with LAQ's, hip marching, SLR hip ABD/ADD, and ankle pumps each 3 sets of 15 reps with verbal and tactile cues for technique and sequencing to maximize results and increase strength and flexibility needed for improving functional mobility and transfers. Manual and positional hamstring stretching to BLE's seated after exercises to improve TKE in standing.  Seated pushing/pulling against dowel bar with LUE on paralell bars to strengthen core/trunk muscles 3 sets of 20 reps to improve core strength.  Bed Mobility: Bed Mobility 1 Bed Mobility From 1: Supine Bed Mobility  Type 1: To and from Bed Mobility to 1: Side lying-right, Side lying-left Level of Assistance 1: Moderate assistance, Minimal verbal cues, Contact guard Bed Mobility Comments 1: Bed mobility training with Rolling Left Mod(A) and rolling to the Right with CG(A) and cues for sequencing and use of handrails to improve independence with functional mobility and decrease fall risk showing need for continued skilled PT intervention to increase independence and safety.  Bed Mobility 2 Bed Mobility From 2: Supine Bed Mobility Type 2: To and from Bed Mobility to 2: Short sit Level of Assistance 2: Maximum assistance, Minimal verbal cues Bed Mobility Comments 2: Sup-Sit EOB with Max(A) and cues for sequencing and use of handrails to improve independence with functional mobility and decrease fall risk. Patient required Max(A) for initial sitting balance and scooting to edge of bed for safety.   Transfer: Patient transfered from wheelchair to EOB towards the Left side. Patient educated on NWB on LLE and NWB on RUE. Verbal and tactile cuing provided to follow NWB. Min A in transfer and assisting with slide board placement. Transfered from EOB to wheelchair with improved directions and sequencing noted. SOB following transfers. Patient completed 5 transfers total.      Pt educated on safety awareness techniques in order to decrease risk of falls. Pt was able to verbalize understanding of education provided with therapist present. CNA training and education provided while in the room in last slide board transfer. CNA's verbalized understanding.   I attest that I have reviewed the above information. Signed: Rankin Lawrence, PT 02/24/2024

## 2024-02-25 NOTE — Progress Notes (Unsigned)
" ° °  Douglas [name] - 68 y.o. male MRN [id number]  Date of birth: August 23, 1955  Office Visit Note: Visit Date: 02/26/2024 PCP: Mancil Pfeiffer, PA-C Referred by: Grooms, [place], PA-C  Subjective:  HPI: Douglas [name] is a 68 y.o. male who presents today for follow up 4 weeks status post right index finger PIP capsulotomy with irrigation and debridement. 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  Pertinent ROS were reviewed with the patient and found to be negative unless otherwise specified above in HPI.   Assessment & Plan: Visit Diagnoses: No diagnosis found.  Plan: ***  Follow-up: No follow-ups on file.   Meds & Orders: No orders of the defined types were placed in this encounter.  No orders of the defined types were placed in this encounter.    Procedures: No procedures performed       Objective:   Vital Signs: There were no vitals taken for this visit.  Ortho Exam ***  Imaging: No results found.   Farhana Fellows Afton Alderton, M.D. [facility], Hand Surgery  "

## 2024-02-26 ENCOUNTER — Ambulatory Visit

## 2024-02-26 ENCOUNTER — Ambulatory Visit (INDEPENDENT_AMBULATORY_CARE_PROVIDER_SITE_OTHER): Admitting: Orthopedic Surgery

## 2024-02-26 DIAGNOSIS — M79641 Pain in right hand: Secondary | ICD-10-CM

## 2024-02-28 ENCOUNTER — Telehealth: Payer: Self-pay | Admitting: *Deleted

## 2024-02-28 NOTE — Transitions of Care (Post Inpatient/ED Visit) (Signed)
" ° °  02/28/2024  Name: Douglas [name] MRN: [id number] DOB: 19-Mar-1955  Today's TOC FU Call Status: Today's TOC FU Call Status:: Unsuccessful Call (1st Attempt) Unsuccessful Call (1st Attempt) Date: 02/28/24  Attempted to reach the patient regarding the most recent Inpatient/ED visit.  Follow Up Plan: Additional outreach attempts will be made to reach the patient to complete the Transitions of Care (Post Inpatient/ED visit) call.   Mliss Creed [facility], BSN RN Care Manager/ Transition of Care [facility] [phone]  "

## 2024-03-02 ENCOUNTER — Telehealth: Payer: Self-pay

## 2024-03-02 NOTE — Transitions of Care (Post Inpatient/ED Visit) (Signed)
 03/02/2024  Patient ID: Douglas [name], male   DOB: 1955-08-26, 69 y.o.   MRN: [id number]  Patient reviewed for follow up call #2 and showing at [facility] L[facility], was on [facility] list. Will sign off.  Richerd Fish, RN, BSN, CCM [facility], [facility] Management Coordinator Direct Dial: [phone]

## 2024-03-02 NOTE — LTC Provider Review (Signed)
" °   OCCUPATIONAL THERAPY TREATMENT NOTE   Patient Name:  Douglas [name]      Medical Record Number: [id number]  Date of Birth: [birth year] Location: [facility] [place]   PT Precautions: Falls,   OT Precautions: Falls,   SLP Precautions: Falls,    Weight Bearing: RUE Weight Bearing: Non weight bearing   LUE Weight Bearing: No Restrictions   RLE Weight Bearing: No Restrictions   LLE Weight Bearing: Non weight bearing    SUBJECTIVE  Pt participated well in therapy today with no complaints of pain.  OBJECTIVE Today's Treatment       Exercise Position: Seated  UE: Left Motions: Scapular Protraction, Scapular Retraction, Shoulder Flexion, Elbow Flexion, Elbow Extension Equipment: Dowel Weights/Reps: Pt engaged in LUE exercise utilizing 7# dowel for gravity eliminated exercise of sliding dowel forward and back across parallel bars 5 sets 10 reps to increase UE strength required to participate in self care tasks and functional transfers.  R hand was cleaned with lotion applied and PROM  to digits to increase ROM and flexibility required to participate in self care tasks.        Ther-Act    Deficits addressed: General Activity Tolerance, ROM, Functional Reach, Strength, Gross Motor Strength/Coordination, Fine Motor Strength/Coordination Functional Reach: Forward, Crossing Midline, Chest Height  Treatment Level: Sitting - Supported Back        Therapeutic Tools Used/Treatment Activity : Pt engaged in transfer training.   Pt transferred from wc to toilet and back to wc with use of grab bar, CGA (minA for sit to stand from wc) with min cues to adhere to weight bearing precautions to RUE and LLE.   Pt engaged in therapeutic activity focused on LUE strength (utilizing 2# wrist weight), fine motor coordination, gross motor coordination, bilateral integration and bilateral hand coordination during functional reaching (forward reaching and crossing  midline) to increase strength and coordination required to participate in self care tasks.  Skilled Intervention Provided : Set-up of activity and environment for effective tx approach, Monitoring of patients response, Grading task for appropriate challenge      I attest that I have reviewed the above information. Signed: Darice Bull, OTA 03/02/2024  "

## 2024-03-02 NOTE — LTC Provider Review (Signed)
" °   SPEECH THERAPY TREATMENT NOTE    Patient Name:  Douglas [name]      Medical Record Number: [id number]  Date of Birth: Jul 18, 1955 Location: [facility] [place]   PT Precautions: Falls,   OT Precautions: Falls,   SLP Precautions: Falls,    Subjective: Pt was engaged and cooperative with skilled ST intervention this date.    Cognitive Communication Treatment: Pt was upright in chair at start of session. PT was instructed to complete fxnl toilet transfer targeting sequencing and procedural recall with pt completing task 100% accuracy requiring min verbal cues. Pt was instructed to complete problem solving and reasoning task. It was noted that pt required increased assistance via the reasoning aspect of task.  I attest that I have reviewed the above information. SignedBETHA Ulanda Moats, SLP 03/02/2024    "

## 2024-03-02 NOTE — Nursing Note (Signed)
 PATIENT IS ALERT AND ORIENTED WITH THE ABILITY TPO MAKE NEEDS KNOWN HAS NO COMPLAINT OF SOB, DOES COMPLAIN OF PAIN TO HIS LOWER BACK, PRN PERCOCET WAS GIVEN TWICE, ONCE AT 2029 AND THE SECOND AT 0232, NOT COMPLAINT OF PAIN AT THIS TIME. PATIENT REQUESTED PRN XANAX  TO HELP SLEEP AT 2354. PATIENT IS IN BED RESTING WITH EYES CLOSED HYDRATION AND CALL LIGHT WITHIN REACH, EVEN AND UNLABORED BREATHING NOTED, IS TO CONTINUE THERAPY CASE LOAD.

## 2024-03-02 NOTE — Nursing Note (Signed)
 OOB IN W/C. PARTICIPATED IN THERAPY. MEDS TAKEN WITHOUT DIFFICULTY. NO COMPLAINTS OF PAIN OR DISCOMFORT VOICED AT THIS TIME. CALL LIGHT WITHIN REACH. ABLE TO MAKE HIS NEEDS KNOWN

## 2024-03-12 ENCOUNTER — Ambulatory Visit

## 2024-03-17 NOTE — LTC Provider Review (Signed)
 SPEECH THERAPY PROGRESS SUMMARY    Patient Name:  Douglas [name]      Medical Record Number: [id number]  Date of Birth: 03-30-1955  Sex: Male       Location: [facility] [place] Physician: Orpha Haley Date Range for Summary:    Missed Treatments: No  PT Precautions: Falls,   OT Precautions: Falls,   SLP Precautions: Falls,     ASSESSMENT:  Analysis of Functional Outcome/Clinical Impression: Pt is engaged and cooperative with skilled ST intervention. Pt is exhibiting progress in cognitive fxn. Pt conts to require cues/redirection during structured therapy tasks d/t attention deficits.   Patient/ Caregiver Training: ST has provided ongoing edu concerning progress, strategies and recommendations.   Skilled Services Provided: ST has assessed and treated cognition. ST has provided ongoing assessment of pts performance. ST has provided ongoing edu and training concerning progress, strategies and recommendations.    PLAN:  Updates to Treatment Approach: It is recommended that pt cont with skilled ST intervention targeting cognition increasing in complexity as appropriate.   Patient continues to require skilled Speech Therapy services to focus on: cognitive deficits  Prognosis for Further Progress: Good due to current progress  Supervision/Assist Recommendations at this time/Discharge Disposition at this time:     SUBJECTIVE: Pt is engaged and cooperative with skilled ST intervention.    OBJECTIVE  Cognition:  Organizational Thought: Mild Calculations: Mild Comments:       GOALS:  Short Term Goals: Short Term Goal 1: Patient will accurately perform calcuation/money management tasks with 80% accuracy in order to facility safe return to prior level of living, promote independence.  Date Established : 03/17/24 Time Frame : 2 weeks  Plan : In Progress  Current Level of Function: 61%  Short Term Goal 2: Patient will recall daily  events using visual aids as needed; new information,  new information, up to 5 elements; with 80% accuracy in order to facilitate decision making skills for care/needs; facilitate safe return to PLOF; increase safety during daily living tasks; safely particapte with functional activities.  Date Established : 03/03/24 Time Frame : 2 weeks Plan : Met    Short Term Goal 3: Patient will perform functional planning/thought organization tasks with 100% accuracy in order to facilitate safety within current environment, increase safety during daily living tasks, return to PLOF.  Date Established : 03/17/24 Time Frame : 2 weeks  Plan : In Progress  Current Level of Function: 100% via cues  Short Term Goal 4: Patient will complete divided attention tasks with 80% accuracy in order to participate in funcational activities of choice, in order to enhance patient's ability to return to prior level of living, in order to enhance patient's ability to safely  return to home/community living.  Date Established : 03/17/24 Time Frame : 2 weeks  Plan : In Progress  Current Level of Function: max cues   Long Term Goals: Long Term Goal #1: Patient will increase ability to function safely without additional assistance/supervision due to cognitive deficits to 80-90% of the time in order to demonstrate improved problem solving/cogntion skills.  Date Established : 03/05/24 Time Frame : 30 Days     DAILY TREATMENT NOTE   Cognitive Communication Treatment: Pts goals and performance were reviewed. PN was developed. It is recommended that pt cont with skilled ST intervention targeting cognition.   I attest that I have reviewed the above information. Signed: Ulanda Moats, SLP Fredricka 03/17/2024

## 2024-03-24 NOTE — Progress Notes (Signed)
" ° °  Douglas [name] - 69 y.o. male MRN [id number]  Date of birth: 07/14/1955  Office Visit Note: Visit Date: 03/25/2024 PCP: Douglas Pfeiffer, PA-C Referred by: [name], Douglas, [place]  Subjective:  HPI: Douglas [name] is a 69 y.o. male who presents today for follow up 8 weeks status post right index finger PIP capsulotomy with irrigation and debridement. 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  Doing well overall, pain is controlled  Pertinent ROS were reviewed with the patient and found to be negative unless otherwise specified above in HPI.   Assessment & Plan: Visit Diagnoses:  1. Right hand pain     Plan: X-rays continue to demonstrate stable appearance of the index finger PIP reduction.  Continue with range of motion exercises and activities as tolerated.  He is welcome to follow-up as needed at this juncture.  Follow-up: No follow-ups on file.   Meds & Orders: No orders of the defined types were placed in this encounter.   Orders Placed This Encounter  Procedures   XR Finger Index Right     Procedures: No procedures performed       Objective:   Vital Signs: There were no vitals taken for this visit.  Ortho Exam Right index finger demonstrates appropriate range of motion at the PIP without joint instability, digit with appropriate color and capillary refill distally, hand remains warm well-perfused  Imaging: No results found. X-rays of the index finger obtained today   Douglas [name]) Douglas [name], M.D. [facility], Hand Surgery  "

## 2024-03-25 ENCOUNTER — Other Ambulatory Visit: Payer: Self-pay

## 2024-03-25 ENCOUNTER — Ambulatory Visit: Admitting: Orthopedic Surgery

## 2024-03-25 DIAGNOSIS — M79641 Pain in right hand: Secondary | ICD-10-CM

## 2024-04-06 ENCOUNTER — Ambulatory Visit

## 2024-04-06 ENCOUNTER — Encounter

## 2024-07-06 ENCOUNTER — Encounter

## 2024-07-06 ENCOUNTER — Ambulatory Visit

## 2024-10-05 ENCOUNTER — Ambulatory Visit

## 2024-10-05 ENCOUNTER — Encounter
